# Patient Record
Sex: Male | Born: 1960 | Race: White | Hispanic: No | Marital: Married | Smoking: Current every day smoker
Health system: Southern US, Community
[De-identification: ages and names within clinical notes are randomized; demographics above are authoritative.]

## PROBLEM LIST (undated history)

## (undated) DIAGNOSIS — E119 Type 2 diabetes mellitus without complications: Secondary | ICD-10-CM

## (undated) DIAGNOSIS — I10 Essential (primary) hypertension: Secondary | ICD-10-CM

## (undated) DIAGNOSIS — E7800 Pure hypercholesterolemia, unspecified: Secondary | ICD-10-CM

## (undated) DIAGNOSIS — J449 Chronic obstructive pulmonary disease, unspecified: Secondary | ICD-10-CM

## (undated) DIAGNOSIS — M459 Ankylosing spondylitis of unspecified sites in spine: Secondary | ICD-10-CM

## (undated) DIAGNOSIS — F4310 Post-traumatic stress disorder, unspecified: Secondary | ICD-10-CM

## (undated) HISTORY — DX: Essential (primary) hypertension: I10

## (undated) HISTORY — PX: CARPAL TUNNEL RELEASE: SHX101

## (undated) HISTORY — PX: TONSILLECTOMY AND ADENOIDECTOMY: SHX28

## (undated) HISTORY — DX: Pure hypercholesterolemia, unspecified: E78.00

## (undated) HISTORY — DX: Post-traumatic stress disorder, unspecified: F43.10

## (undated) HISTORY — PX: APPENDECTOMY: SHX54

## (undated) HISTORY — DX: Chronic obstructive pulmonary disease, unspecified: J44.9

## (undated) HISTORY — PX: OTHER SURGICAL HISTORY: SHX169

---

## 2003-10-05 ENCOUNTER — Ambulatory Visit (HOSPITAL_BASED_OUTPATIENT_CLINIC_OR_DEPARTMENT_OTHER): Admission: RE | Admit: 2003-10-05 | Discharge: 2003-10-05 | Payer: Self-pay | Admitting: Neurology

## 2005-08-03 ENCOUNTER — Ambulatory Visit: Payer: Self-pay | Admitting: Family Medicine

## 2005-10-04 ENCOUNTER — Ambulatory Visit: Payer: Self-pay | Admitting: Family Medicine

## 2005-12-28 ENCOUNTER — Ambulatory Visit: Payer: Self-pay | Admitting: Family Medicine

## 2006-03-05 ENCOUNTER — Encounter: Payer: Self-pay | Admitting: Family Medicine

## 2006-03-06 DIAGNOSIS — F429 Obsessive-compulsive disorder, unspecified: Secondary | ICD-10-CM | POA: Insufficient documentation

## 2006-03-06 DIAGNOSIS — E669 Obesity, unspecified: Secondary | ICD-10-CM | POA: Insufficient documentation

## 2006-03-06 DIAGNOSIS — E7800 Pure hypercholesterolemia, unspecified: Secondary | ICD-10-CM | POA: Insufficient documentation

## 2006-04-15 ENCOUNTER — Encounter: Admission: RE | Admit: 2006-04-15 | Discharge: 2006-04-15 | Payer: Self-pay | Admitting: Sports Medicine

## 2006-06-14 ENCOUNTER — Encounter: Payer: Self-pay | Admitting: Family Medicine

## 2006-06-14 ENCOUNTER — Ambulatory Visit: Payer: Self-pay | Admitting: Family Medicine

## 2006-08-29 ENCOUNTER — Ambulatory Visit: Payer: Self-pay | Admitting: Family Medicine

## 2006-08-29 DIAGNOSIS — R030 Elevated blood-pressure reading, without diagnosis of hypertension: Secondary | ICD-10-CM | POA: Insufficient documentation

## 2006-09-10 ENCOUNTER — Ambulatory Visit: Payer: Self-pay | Admitting: Family Medicine

## 2006-10-08 ENCOUNTER — Ambulatory Visit: Payer: Self-pay | Admitting: Family Medicine

## 2006-12-28 ENCOUNTER — Encounter: Admission: RE | Admit: 2006-12-28 | Discharge: 2006-12-28 | Payer: Self-pay | Admitting: Family Medicine

## 2006-12-28 ENCOUNTER — Ambulatory Visit: Payer: Self-pay | Admitting: Family Medicine

## 2006-12-28 ENCOUNTER — Telehealth: Payer: Self-pay | Admitting: Family Medicine

## 2006-12-28 DIAGNOSIS — M25569 Pain in unspecified knee: Secondary | ICD-10-CM | POA: Insufficient documentation

## 2007-01-01 ENCOUNTER — Encounter: Payer: Self-pay | Admitting: Family Medicine

## 2007-01-15 ENCOUNTER — Encounter: Payer: Self-pay | Admitting: Family Medicine

## 2007-04-08 ENCOUNTER — Ambulatory Visit: Payer: Self-pay | Admitting: Family Medicine

## 2008-01-08 ENCOUNTER — Ambulatory Visit: Payer: Self-pay | Admitting: Family Medicine

## 2008-06-25 ENCOUNTER — Ambulatory Visit: Payer: Self-pay | Admitting: Family Medicine

## 2008-08-17 ENCOUNTER — Ambulatory Visit: Payer: Self-pay | Admitting: Family Medicine

## 2008-08-17 ENCOUNTER — Encounter: Admission: RE | Admit: 2008-08-17 | Discharge: 2008-08-17 | Payer: Self-pay | Admitting: Family Medicine

## 2008-08-21 ENCOUNTER — Telehealth: Payer: Self-pay | Admitting: Family Medicine

## 2008-08-26 ENCOUNTER — Encounter: Payer: Self-pay | Admitting: Family Medicine

## 2008-09-02 ENCOUNTER — Ambulatory Visit (HOSPITAL_COMMUNITY): Admission: RE | Admit: 2008-09-02 | Discharge: 2008-09-02 | Payer: Self-pay | Admitting: Rheumatology

## 2008-09-10 ENCOUNTER — Encounter: Payer: Self-pay | Admitting: Family Medicine

## 2008-09-29 ENCOUNTER — Encounter: Payer: Self-pay | Admitting: Family Medicine

## 2008-12-16 ENCOUNTER — Ambulatory Visit: Payer: Self-pay | Admitting: Family Medicine

## 2009-01-01 ENCOUNTER — Encounter: Payer: Self-pay | Admitting: Family Medicine

## 2009-03-09 ENCOUNTER — Encounter: Payer: Self-pay | Admitting: Family Medicine

## 2009-05-04 ENCOUNTER — Encounter: Payer: Self-pay | Admitting: Family Medicine

## 2009-06-24 ENCOUNTER — Ambulatory Visit: Payer: Self-pay | Admitting: Family Medicine

## 2009-06-25 ENCOUNTER — Ambulatory Visit: Payer: Self-pay | Admitting: Family Medicine

## 2009-06-30 ENCOUNTER — Encounter: Admission: RE | Admit: 2009-06-30 | Discharge: 2009-06-30 | Payer: Self-pay | Admitting: Family Medicine

## 2009-06-30 ENCOUNTER — Ambulatory Visit: Payer: Self-pay | Admitting: Family Medicine

## 2009-07-05 ENCOUNTER — Ambulatory Visit: Payer: Self-pay | Admitting: Family Medicine

## 2009-07-11 ENCOUNTER — Ambulatory Visit: Payer: Self-pay | Admitting: Internal Medicine

## 2009-07-29 ENCOUNTER — Encounter: Payer: Self-pay | Admitting: Family Medicine

## 2009-09-06 ENCOUNTER — Encounter: Payer: Self-pay | Admitting: Family Medicine

## 2009-09-22 ENCOUNTER — Ambulatory Visit: Payer: Self-pay | Admitting: Family Medicine

## 2009-09-26 ENCOUNTER — Ambulatory Visit: Payer: Self-pay | Admitting: Family Medicine

## 2009-09-30 ENCOUNTER — Ambulatory Visit: Payer: Self-pay | Admitting: Family Medicine

## 2009-09-30 DIAGNOSIS — M459 Ankylosing spondylitis of unspecified sites in spine: Secondary | ICD-10-CM

## 2009-09-30 DIAGNOSIS — M450 Ankylosing spondylitis of multiple sites in spine: Secondary | ICD-10-CM | POA: Insufficient documentation

## 2009-11-11 ENCOUNTER — Encounter: Payer: Self-pay | Admitting: Family Medicine

## 2010-02-17 ENCOUNTER — Encounter: Payer: Self-pay | Admitting: Family Medicine

## 2010-03-16 ENCOUNTER — Ambulatory Visit: Payer: Self-pay | Admitting: Family Medicine

## 2010-05-03 ENCOUNTER — Ambulatory Visit: Payer: Self-pay | Admitting: Family Medicine

## 2010-05-31 ENCOUNTER — Encounter: Payer: Self-pay | Admitting: Family Medicine

## 2010-06-19 ENCOUNTER — Encounter: Payer: Self-pay | Admitting: Sports Medicine

## 2010-06-19 ENCOUNTER — Encounter: Payer: Self-pay | Admitting: Rheumatology

## 2010-06-30 NOTE — Letter (Signed)
Summary: Out of Work  [facility]  [address]   [place], [place] [address]   Phone: [phone]  Fax: [phone]    September 22, 2009   Employee:  Robert Frey    To Whom It May Concern:   For Medical reasons, please excuse the above named employee from work for the following dates:  Start:   09/22/2009  Return:   09/25/2009  If you need additional information, please feel free to contact our office.         Sincerely,    Hassan Rowan MD

## 2010-06-30 NOTE — Assessment & Plan Note (Signed)
Summary: Chest congestion, fever, cough-dry x 1 dy rm 3   Vital Signs:  Patient Profile:   50 Years Old Male CC:      Cold & URI symptoms Height:     70.25 inches (178.44 cm) Weight:      241 pounds O2 Sat:      93 % O2 treatment:    Room Air Temp:     99.0 degrees F oral Pulse rate:   101 / minute Pulse rhythm:   regular Resp:     15 per minute BP sitting:   114 / 68  (right arm) Cuff size:   114regular  Vitals Entered By: Areta Haber CMA (June 24, 2009 6:57 PM)                  Current Allergies: ! * Z-PACK     History of Present Illness History from: patient Chief Complaint: Cold & URI symptoms History of Present Illness: patient c/o chest pain that worsens with cough since this morning associated with chills, night sweats and low grade temp that began last night.  Reports cough is nonproductive in nature, took tylenol for symptoms with minimal relief. DENIES SHORTNESS OF BREATH, RUNNY NOSE, AND SORE THROAT  Current Problems: PNEUMONIA, ORGANISM UNSPECIFIED (ICD-486) COUGH (ICD-786.2) URI (ICD-465.9) ACUT SUPPRATV OTITIS MEDIA W/O SPONT RUP EARDRUM (ICD-382.00) KNEE PAIN, BILATERAL (ICD-719.46) WRIST PAIN, LEFT (ICD-719.43) HORDEOLUM INTERNUM (ICD-373.12) BRONCHITIS NOS (ICD-490) ELEVATED BLOOD PRESSURE WITHOUT DIAGNOSIS OF HYPERTENSION (ICD-796.2) VACCINE AGAINST INFLUENZA (ICD-V04.81) SINUSITIS, ACUTE NOS (ICD-461.9) OBSESSIVE COMPUL. DISORDER (ICD-300.3) OBESITY, NOS (ICD-278.00) HYPERCHOLESTEROLEMIA (ICD-272.0)   Current Meds IMIPRAMINE HCL 50 MG TABS (IMIPRAMINE HCL) take four by mouth, at bedtime FLUVOXAMINE MALEATE 100 MG TABS (FLUVOXAMINE MALEATE) twice a daytake one tablet by mouth VENTOLIN HFA 108 (90 BASE) MCG/ACT AERS (ALBUTEROL SULFATE) 2-4 puffs inhaled three times a day as needed cough, SOB, wheeze HYDROCODONE-HOMATROPINE 5-1.5 MG/5ML SYRP (HYDROCODONE-HOMATROPINE) 5ml by mouth at bedtime as needed cough METHOTREXATE 2.5 MG TABS  (METHOTREXATE SODIUM) injection directed to draw up .8 mgs by RA physician ENBREL SURECLICK 50 MG/ML SOLN (ETANERCEPT) As directed DOXYCYCLINE HYCLATE 100 MG CAPS (DOXYCYCLINE HYCLATE) 1 by mouth Q 12 HRS PROVENTIL HFA 108 (90 BASE) MCG/ACT AERS (ALBUTEROL SULFATE) 2 PUFFS QID as needed WHEEZING CHERATUSSIN AC 100-10 MG/5ML SYRP (GUAIFENESIN-CODEINE) 1-2 TSP by mouth Q 6 HRS as needed COUGH AND CONGESTION  REVIEW OF SYSTEMS Constitutional Symptoms       Complains of fever.     Denies chills, night sweats, weight loss, weight gain, and fatigue.  Eyes       Denies change in vision, eye pain, eye discharge, glasses, contact lenses, and eye surgery. Ear/Nose/Throat/Mouth       Complains of frequent runny nose, sinus problems, and hoarseness.      Denies hearing loss/aids, change in hearing, ear pain, ear discharge, dizziness, frequent nose bleeds, sore throat, and tooth pain or bleeding.      Comments: x 1 day Respiratory       Complains of dry cough.      Denies productive cough, wheezing, shortness of breath, asthma, bronchitis, and emphysema/COPD.  Cardiovascular       Denies murmurs, chest pain, and tires easily with exhertion.    Gastrointestinal       Denies stomach pain, nausea/vomiting, diarrhea, constipation, blood in bowel movements, and indigestion. Genitourniary       Denies painful urination, kidney stones, and loss of urinary control. Neurological       Complains  of headaches.      Denies paralysis, seizures, and fainting/blackouts. Musculoskeletal       Denies muscle pain, joint pain, joint stiffness, decreased range of motion, redness, swelling, muscle weakness, and gout.  Skin       Denies bruising, unusual mles/lumps or sores, and hair/skin or nail changes.  Psych       Denies mood changes, temper/anger issues, anxiety/stress, speech problems, depression, and sleep problems.  Past History:  Past Medical History: Last updated: 03/06/2006 _  Past Surgical  History: Last updated: 12/28/2006 appendectomy  1989  tonsil and nose sx for snoring 2002  Family History: Last updated: 06/14/2006 alcoholism-grandparents  Depression, DM, HTN, hi chol-parent, stroke-GF  Social History: Last updated: 03/06/2006 [employer] for the [employer], HS degree.  Married to [employer].  Have one child.  Quit smoking 08/13/04.  No EtOH, no drugs, 8 caffeinated drinks/day, no reg exercise.  Risk Factors: Smoking Status: quit (06/14/2006) Physical Exam General appearance: well developed, well nourished, no acute distress Head: normocephalic, atraumatic Eyes: conjunctivae and lids normal Pupils: equal, round, reactive to light Ears: normal, no lesions or deformities Oral/Pharynx: mucus membranes dry, posterior pharynx without erythema or exudate Neck: neck supple,  trachea midline, no masses Chest/Lungs: rales and inspiratory rhonchi in left lung Heart: regular rate and  rhythm, no murmur Assessment New Problems: COUGH (ICD-786.2)   Plan New Medications/Changes: CHERATUSSIN AC 100-10 MG/5ML SYRP (GUAIFENESIN-CODEINE) 1-2 TSP by mouth Q 6 HRS as needed COUGH AND CONGESTION  #6 OZ x 0, 06/24/2009, Kimberly Lykins DO PROVENTIL HFA 108 (90 BASE) MCG/ACT AERS (ALBUTEROL SULFATE) 2 PUFFS QID as needed WHEEZING  #1 UNIT x 0, 06/24/2009, Marvis Moeller DO DOXYCYCLINE HYCLATE 100 MG CAPS (DOXYCYCLINE HYCLATE) 1 by mouth Q 12 HRS  ##20 x 0, 06/24/2009, Marvis Moeller DO  New Orders: T-Chest x-ray, 2 views [71020] New Patient Level III [99203]   Prescriptions: CHERATUSSIN AC 100-10 MG/5ML SYRP (GUAIFENESIN-CODEINE) 1-2 TSP by mouth Q 6 HRS as needed COUGH AND CONGESTION  #6 OZ x 0   Entered and Authorized by:   Marvis Moeller DO   Signed by:   Marvis Moeller DO on 06/24/2009   Method used:   Print then Give to Patient   RxID:   [id number] PROVENTIL HFA 108 (90 BASE) MCG/ACT AERS (ALBUTEROL SULFATE) 2 PUFFS QID as needed WHEEZING  #1  UNIT x 0   Entered and Authorized by:   Marvis Moeller DO   Signed by:   Marvis Moeller DO on 06/24/2009   Method used:   Print then Give to Patient   RxID:   [id number] DOXYCYCLINE HYCLATE 100 MG CAPS (DOXYCYCLINE HYCLATE) 1 by mouth Q 12 HRS  ##20 x 0   Entered and Authorized by:   Marvis Moeller DO   Signed by:   Marvis Moeller DO on 06/24/2009   Method used:   Print then Give to Patient   RxID:   [id number]   Patient Instructions: 1)  TYLENOL OR MOTRIN AS NEEDED. AVOID CAFFEINE AND MILK PRODUCTS. FOLLOW UP WITH YOUR PCP OR RETURN IF SYMPTOMS PERSIST OR WORSEN

## 2010-06-30 NOTE — Letter (Signed)
Summary: [facility]  [facility]   Imported By: Lanelle Bal 11/26/2009 10:09:25  _____________________________________________________________________  External Attachment:    Type:   Image     Comment:   External Document

## 2010-06-30 NOTE — Assessment & Plan Note (Signed)
Summary: FLU/COLD/WB   Vital Signs:  Patient Profile:   50 Years Old Male CC:      runny nose, body aches, productive cough, SOB with exertion X 3 days Height:     70.25 inches (178.44 cm) Weight:      242 pounds O2 Sat:      98 % O2 treatment:    Room Air Temp:     98.3 degrees F oral Pulse rate:   109 / minute Resp:     16 per minute BP sitting:   136 / 90  (right arm) Cuff size:   large  Vitals Entered By: Lajean Saver RN (September 22, 2009 3:49 PM)                  Prior Medication List:  IMIPRAMINE HCL 50 MG TABS (IMIPRAMINE HCL) take four by mouth, at bedtime FLUVOXAMINE MALEATE 100 MG TABS (FLUVOXAMINE MALEATE) twice a daytake one tablet by mouth HYDROCODONE-HOMATROPINE 5-1.5 MG/5ML SYRP (HYDROCODONE-HOMATROPINE) 5ml by mouth at bedtime as needed cough METHOTREXATE 2.5 MG TABS (METHOTREXATE SODIUM) injection directed to draw up .8 mgs by RA physician ENBREL SURECLICK 50 MG/ML SOLN (ETANERCEPT) As directed PROVENTIL HFA 108 (90 BASE) MCG/ACT AERS (ALBUTEROL SULFATE) 2 PUFFS QID as needed WHEEZING PROMETHAZINE-CODEINE 6.25-10 MG/5ML SYRP (PROMETHAZINE-CODEINE) 5 ml by mouth q 6 hrs as needed cough FLUCONAZOLE 150 MG TABS (FLUCONAZOLE) 1 tab by mouth x 1 CHERATUSSIN AC 100-10 MG/5ML SYRP (GUAIFENESIN-CODEINE) Take 5ml by mouth q8 hours as needed cough   Updated Prior Medication List: IMIPRAMINE HCL 50 MG TABS (IMIPRAMINE HCL) take four by mouth, at bedtime HYDROCODONE-HOMATROPINE 5-1.5 MG/5ML SYRP (HYDROCODONE-HOMATROPINE) 5ml by mouth at bedtime as needed cough METHOTREXATE 2.5 MG TABS (METHOTREXATE SODIUM) injection directed to draw up .8 mgs by RA physician ENBREL SURECLICK 50 MG/ML SOLN (ETANERCEPT) As directed  Current Allergies (reviewed today): ! * Z-PACK     History of Present Illness Chief Complaint: runny nose, body aches, productive cough, SOB with exertion X 3 days History of Present Illness: Patient is a 50 year old W M w/HX of RA and immune  suppression drugs who has been sick foor the last 4 days. He has had increase sinus pressure cough and bronchospasm. Patient does not smoke and does not usuall has a HX of Copd.   Current Problems: ACUTE SINUSITIS, UNSPECIFIED (ICD-461.9) BRONCHITIS, ACUTE WITH BRONCHOSPASM (ICD-466.0) THRUSH (ICD-112.0) BRONCHITIS (ICD-490) PNEUMONIA, ORGANISM UNSPECIFIED (ICD-486) COUGH (ICD-786.2) URI (ICD-465.9) ACUT SUPPRATV OTITIS MEDIA W/O SPONT RUP EARDRUM (ICD-382.00) KNEE PAIN, BILATERAL (ICD-719.46) WRIST PAIN, LEFT (ICD-719.43) HORDEOLUM INTERNUM (ICD-373.12) BRONCHITIS NOS (ICD-490) ELEVATED BLOOD PRESSURE WITHOUT DIAGNOSIS OF HYPERTENSION (ICD-796.2) VACCINE AGAINST INFLUENZA (ICD-V04.81) SINUSITIS, ACUTE NOS (ICD-461.9) OBSESSIVE COMPUL. DISORDER (ICD-300.3) OBESITY, NOS (ICD-278.00) HYPERCHOLESTEROLEMIA (ICD-272.0)   Current Meds IMIPRAMINE HCL 50 MG TABS (IMIPRAMINE HCL) take four by mouth, at bedtime HYDROCODONE-HOMATROPINE 5-1.5 MG/5ML SYRP (HYDROCODONE-HOMATROPINE) 5ml by mouth at bedtime as needed cough METHOTREXATE 2.5 MG TABS (METHOTREXATE SODIUM) injection directed to draw up .8 mgs by RA physician ENBREL SURECLICK 50 MG/ML SOLN (ETANERCEPT) As directed AUGMENTIN 875-125 MG TABS (AMOXICILLIN-POT CLAVULANATE) 1 by mouth 2 times daily TUSSIONEX PENNKINETIC ER 8-10 MG/5ML LQCR (CHLORPHENIRAMINE-HYDROCODONE) sig 1 tsp by mouth twice aday PROAIR HFA 108 (90 BASE) MCG/ACT  AERS (ALBUTEROL SULFATE) 2 inh q4h as needed shortness of breath SYMBICORT 160-4.5 MCG/ACT  AERO (BUDESONIDE-FORMOTEROL FUMARATE) 2 inh bid  REVIEW OF SYSTEMS Constitutional Symptoms       Complains of fatigue.     Denies fever, chills, night sweats,  weight loss, and weight gain.      Comments: body aches Eyes       Denies change in vision, eye pain, eye discharge, glasses, contact lenses, and eye surgery. Ear/Nose/Throat/Mouth       Complains of frequent runny nose and sinus problems.      Denies hearing  loss/aids, change in hearing, ear pain, ear discharge, dizziness, frequent nose bleeds, sore throat, hoarseness, and tooth pain or bleeding.  Respiratory       Complains of productive cough.      Denies dry cough, wheezing, shortness of breath, asthma, bronchitis, and emphysema/COPD.  Cardiovascular       Denies murmurs, chest pain, and tires easily with exhertion.    Gastrointestinal       Denies stomach pain, nausea/vomiting, diarrhea, constipation, blood in bowel movements, and indigestion. Genitourniary       Denies painful urination, kidney stones, and loss of urinary control. Neurological       Denies paralysis, seizures, and fainting/blackouts. Musculoskeletal       Denies muscle pain, joint pain, joint stiffness, decreased range of motion, redness, swelling, muscle weakness, and gout.  Skin       Denies bruising, unusual mles/lumps or sores, and hair/skin or nail changes.  Psych       Denies mood changes, temper/anger issues, anxiety/stress, speech problems, depression, and sleep problems. Other Comments: X 3 days   Past History:  Family History: Last updated: 06/14/2006 alcoholism-grandparents  Depression, DM, HTN, hi chol-parent, stroke-GF  Social History: Last updated: 03/06/2006 [employer] for the [employer], HS degree.  Married to [employer].  Have one child.  Quit smoking 08/13/04.  No EtOH, no drugs, 8 caffeinated drinks/day, no reg exercise.  Risk Factors: Smoking Status: quit (06/14/2006)  Past Medical History: _spondilitis  Past Surgical History: appendectomy  1989  tonsil and nose sx for snoring 2002 Appendectomy Tonsillectomy + adenoids  Family History: Reviewed history from 06/14/2006 and no changes required. alcoholism-grandparents  Depression, DM, HTN, hi chol-parent, stroke-GF  Social History: Reviewed history from 03/06/2006 and no changes required. [employer] for the [employer], HS degree.  Married to [employer].  Have one child.  Quit smoking 08/13/04.  No EtOH, no drugs, 8 caffeinated drinks/day, no reg exercise. Physical Exam General appearance: obese, well developed, well nourished, mild distress Head: normocephalic, atraumatic Ears: normal, no lesions or deformities Nasal: swollen red turbinates with congestiontenderness over  Oral/Pharynx: tongue normal, posterior pharynx without erythema or exudate Neck: neck supple,  trachea midline, no masses Chest/Lungs: scattered wheezes throughout all lobes Heart: regular rate and  rhythm, no murmur Abdomen: protubert bowel sounds present Skin: no obvious rashes or lesions MSE: oriented to time, place, and person Assessment New Problems: ACUTE SINUSITIS, UNSPECIFIED (ICD-461.9) BRONCHITIS, ACUTE WITH BRONCHOSPASM (ICD-466.0)  bronchitis and sinusitis  Patient Education: Patient and/or caregiver instructed in the following: rest fluids and Tylenol.  Plan New Medications/Changes: SYMBICORT 160-4.5 MCG/ACT  AERO (BUDESONIDE-FORMOTEROL FUMARATE) 2 inh bid  #1 x 0, 09/22/2009, Hassan Rowan MD PROAIR HFA 108 (90 BASE) MCG/ACT  AERS (ALBUTEROL SULFATE) 2 inh q4h as needed shortness of breath  #1 x 0, 09/22/2009, Hassan Rowan MD TUSSIONEX PENNKINETIC ER 8-10 MG/5ML LQCR (CHLORPHENIRAMINE-HYDROCODONE) sig 1 tsp by mouth twice aday  #4 floz x 0, 09/22/2009, Hassan Rowan MD AUGMENTIN [phone] MG TABS (AMOXICILLIN-POT CLAVULANATE) 1 by mouth 2 times daily  #20 x 0, 09/22/2009, Hassan Rowan MD  New Orders: Est. Patient Level III [  99213] Nebulizer Tx [id number] Rocephin  250mg  [J0696] Solumedrol up to 125mg  [J2930] Admin of Therapeutic Inj  intramuscular or subcutaneous [96372] Albuterol Sulfate Sol 1mg  unit dose [J7613] Ipratropium inhalation sol. unit dose [Z3086] Follow Up: Follow up on an as needed basis, Follow up with Primary Physician  The patient and/or caregiver has been counseled thoroughly with regard to medications prescribed including dosage,  schedule, interactions, rationale for use, and possible side effects and they verbalize understanding.  Diagnoses and expected course of recovery discussed and will return if not improved as expected or if the condition worsens. Patient and/or caregiver verbalized understanding.  Prescriptions: SYMBICORT 160-4.5 MCG/ACT  AERO (BUDESONIDE-FORMOTEROL FUMARATE) 2 inh bid  #1 x 0   Entered and Authorized by:   Hassan Rowan MD   Signed by:   Hassan Rowan MD on 09/22/2009   Method used:   Printed then faxed to ...       [facility]  [place] [phone]* (retail)       [address] [place], [place]  [address]       Ph: [phone] or [phone]       Fax: [phone]   RxID:   [id number] PROAIR HFA 108 (90 BASE) MCG/ACT  AERS (ALBUTEROL SULFATE) 2 inh q4h as needed shortness of breath  #1 x 0   Entered and Authorized by:   Hassan Rowan MD   Signed by:   Hassan Rowan MD on 09/22/2009   Method used:   Printed then faxed to ...       [facility]  [place] [phone]* (retail)       [address] [place], [place]  [address]       Ph: [phone] or [phone]       Fax: [phone]   RxID:   [id number] Sandria Senter ER 8-10 MG/5ML LQCR (CHLORPHENIRAMINE-HYDROCODONE) sig 1 tsp by mouth twice aday  #4 floz x 0   Entered and Authorized by:   Hassan Rowan MD   Signed by:   Hassan Rowan MD on 09/22/2009   Method used:   Printed then faxed to ...       [facility]  [place] [phone][facility] (retail)       [address] [place], [place]  [address]       Ph: [phone] or [phone]       Fax: [phone]   RxID:   [id number] AUGMENTIN 875-125 MG TABS (AMOXICILLIN-POT CLAVULANATE) 1 by mouth 2 times daily  #20 x 0   Entered and Authorized by:   Hassan Rowan MD   Signed by:   Hassan Rowan MD on 09/22/2009   Method used:   Printed then faxed to ...       [facility]  [place] [phone][facility] (retail)       [address] [place], [place]  [address]       Ph: [phone] or [phone]       Fax: [phone]    RxID:   [id number]   Patient Instructions: 1)  Please schedule an appointment with your primary doctor in : 2)  Please schedule a follow-up appointment in 1-2 weeks. 3)  Take your antibiotic as prescribed until ALL of it is gone, but stop if you develop a rash or swelling and contact our office as soon as possible. 4)  Acute sinusitis symptoms  for less than 10 days are not helped by antibiotics.Use warm moist compresses, and over the counter decongestants ( only as directed). Call if no improvement in 5-7 days, sooner if increasing pain, fever, or new symptoms. 5)  Acute bronchitis symptoms for less than 10 days are not helped by antibiotics. take over the counter cough medications. call if no improvment in  5-7 days, sooner if increasing cough, fever, or new symptoms( shortness of breath, chest pain). 6)  It is important to use your inhaler properly. Use a spacer, take slow deep breaths and hold them. Rinse your mouth after using.    Medication Administration  Injection # 1:    Medication: Solumedrol up to 125mg     Diagnosis: ACUTE SINUSITIS, UNSPECIFIED (ICD-461.9)    Route: IM    Site: LUOQ gluteus    Exp Date: 09/31/2013    Lot #: [id number]    Mfr: Pharmacia    Patient tolerated injection without complications    Given by: Lajean Saver, RN  Injection # 2:    Medication: Rocephin  250mg     Diagnosis: ACUTE SINUSITIS, UNSPECIFIED (ICD-461.9)    Route: IM    Site: R deltoid    Exp Date: 04/27/2012    Lot #: [id number]    Mfr: Sandox    Comments: Administerd 1gram    Patient tolerated injection without complications    Given by: Lajean Saver, RN  Medication # 1:    Medication: Albuterol Sulfate Sol 1mg  unit dose    Diagnosis: ACUTE SINUSITIS, UNSPECIFIED (ICD-461.9)    Route: inhaled    Exp Date: 01/27/2011    Lot #: [id number]    Mfr: Nephron    Patient tolerated medication without complications    Given by: Areta Haber CMA (September 22, 2009 5:09 PM)  Medication #  2:    Medication: Ipratropium inhalation sol. unit dose    Diagnosis: ACUTE SINUSITIS, UNSPECIFIED (ICD-461.9)    Route: inhaled    Exp Date: 09/25/2009    Lot #: [id number]    Mfr: Nephron    Patient tolerated medication without complications    Given by: Areta Haber CMA (September 22, 2009 5:09 PM)  Orders Added: 1)  Est. Patient Level III [62130] 2)  Nebulizer Tx [94640] 3)  Rocephin  250mg  [J0696] 4)  Solumedrol up to 125mg  [J2930] 5)  Admin of Therapeutic Inj  intramuscular or subcutaneous [96372] 6)  Albuterol Sulfate Sol 1mg  unit dose [J7613] 7)  Ipratropium inhalation sol. unit dose [Q6578]

## 2010-06-30 NOTE — Assessment & Plan Note (Signed)
Summary: Drug with drawal   Vital Signs:  Patient profile:   50 year old male Height:      70.25 inches Weight:      236 pounds Pulse rate:   90 / minute BP sitting:   148 / 92  (right arm) Cuff size:   large  Vitals Entered By: Avon Gully CMA, Duncan Dull) (March 16, 2010 11:03 AM) CC: had flu shot 1 week ago , had body aches and sinus congestion and HA since then but feels better today   Primary Care Provider:  Nani Gasser MD  CC:  had flu shot 1 week ago  and had body aches and sinus congestion and HA since then but feels better today.  History of Present Illness: had flu shot 1 week ago , had body aches and sinus congestion and HA since then but feels better today. No fever. Thinks had it really before he got the shot.    Feels he is addicted to teh hydrocone. When he doesn't take it feels like having hot flashes, sweating.  Now feels he is taking more and more for his AK pain.  Has tried taking Ultram but feels the withdrawal sxs are worse. Was taking 5/500 hydrocodone 3 x a day. Stopped it this AM and flushed them down the toilet Has told his rheumatologist.   Current Medications (verified): 1)  Imipramine Hcl 50 Mg Tabs (Imipramine Hcl) .... Take Four By Mouth, At Bedtime 2)  Hydrocodone-Homatropine 5-1.5 Mg/86ml Syrp (Hydrocodone-Homatropine) .... 5ml By Mouth At Bedtime As Needed Cough 3)  Methotrexate 2.5 Mg Tabs (Methotrexate Sodium) .... Injection Directed To Draw Up .8 Mgs By Ra Physician 4)  Enbrel Sureclick 50 Mg/ml Soln (Etanercept) .... As Directed  Allergies (verified): 1)  ! * Z-Pack  Comments:  Nurse/Medical Assistant: The patient's medications and allergies were reviewed with the patient and were updated in the Medication and Allergy Lists. Avon Gully CMA, Duncan Dull) (March 16, 2010 11:04 AM)  Physical Exam  General:  Well-developed,well-nourished,in no acute distress; alert,appropriate and cooperative throughout examination Lungs:  Normal  respiratory effort, chest expands symmetrically. Lungs are clear to auscultation, no crackles or wheezes. Heart:  Normal rate and regular rhythm. S1 and S2 normal without gallop, murmur, click, rub or other extra sounds.   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 Then will need to f/u with rheum for other tx.  His is already on enbrel and mtx.   Stay really well hydrated and make sure eatting foods.  TAke the valium 10mg  two times a day for 3 days  Can use the phenergan as needed for nausea and vomiting Can use Ibuprofen 600mg  three times a day as needed for muscle aches and pains. Take with food.  Call if getting diarrhea.   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 Then will need to f/u with rheum for other tx.  His is already on enbrel and mtx.   Stay really well hydrated and make sure eatting foods.  TAke the valium 10mg  two times a day for 3 days  Can use the phenergan as needed for nausea and vomiting Can use Ibuprofen 600mg  three times a day as needed for  muscle aches and pains. Take with food.  Call if getting diarrhea.   Complete Medication List: 1)  Imipramine Hcl 50 Mg Tabs (Imipramine hcl) .... Take four by mouth, at bedtime 2)  Methotrexate 2.5 Mg Tabs (Methotrexate sodium) .... Injection directed to draw up .8 mgs by ra physician 3)  Enbrel Sureclick 50 Mg/ml Soln (Etanercept) .... As directed 4)  Diazepam 10 Mg Tabs (Diazepam) .... Take 1 tablet by mouth two times a day for 3 days 5)  Promethazine Hcl 25 Mg Tabs (Promethazine hcl) .... One by mouth up three times a day for nausea  Patient Instructions: 1)  Stay really well hydrated and make sure eatting foods.  2)   TAke the valium 10mg  two times a day for 3 days  3)  Can use the phenergan as needed for nausea and vomiting 4)  Can use Ibuprofen 600mg  three times a day as needed for muscle aches and pains. Take with food.  5)  Call if getting diarrhea.  Prescriptions: PROMETHAZINE HCL 25 MG TABS (PROMETHAZINE HCL) one by mouth up three times a day for nausea  #20 x 0   Entered and Authorized by:   Nani Gasser MD   Signed by:   Nani Gasser MD on 03/16/2010   Method used:   Print then Give to Patient   RxID:   [id number] DIAZEPAM 10 MG TABS (DIAZEPAM) Take 1 tablet by mouth two times a day for 3 days  #6 x 0   Entered and Authorized by:   Nani Gasser MD   Signed by:   Nani Gasser MD on 03/16/2010   Method used:   Print then Give to Patient   RxID:   [id number]    Orders Added: 1)  Est. Patient Level III [72536]

## 2010-06-30 NOTE — Letter (Signed)
Summary: Out of Work  [facility]  [address]   [place], [place] [address]   Phone: [phone]  Fax: [phone]    Sep 26, 2009   Employee:  Robert Frey    To Whom It May Concern:   For Medical reasons, please excuse the above named employee from work tomorrow.    If you need additional information, please feel free to contact our office.         Sincerely,    Donna Christen MD

## 2010-06-30 NOTE — Assessment & Plan Note (Signed)
Summary: PNA   Vital Signs:  Patient profile:   50 year old male Height:      70.25 inches Weight:      240 pounds BMI:     34.32 Temp:     98.4 degrees F oral Pulse rate:   94 / minute BP sitting:   113 / 57  (left arm) Cuff size:   large  Vitals Entered By: Kathlene November (June 30, 2009 2:30 PM) CC: chest congestion, vomiting, cough   Primary Care Provider:  Nani Gasser MD  CC:  chest congestion, vomiting, and cough.  History of Present Illness: 50 yo WM presents for problems with cough, fevers, and congestion x 7 days.  Ran fevers of 103 for the first 3 days.  He had bodyaches and some HAs.  No nasal congestion.  No sore throat.  He had vomitting today.  He felt nauseated today.  He is on Doxycyline that was started at [facility] last wk but has not been improving.  He cough is non productive.  Denies feeling SOB or CP.  Using Rx cough syrup at night.  He is not a smoker.  Took Embrel over the weekend.    Current Medications (verified): 1)  Imipramine Hcl 50 Mg Tabs (Imipramine Hcl) .... Take Four By Mouth, At Bedtime 2)  Fluvoxamine Maleate 100 Mg Tabs (Fluvoxamine Maleate) .... Twice A Daytake One Tablet By Mouth 3)  Doxycycline Hyclate 100 Mg Caps (Doxycycline Hyclate) .... Take 1 Tablet By Mouth Two Times A Day For 10 Days 4)  Ventolin Hfa 108 (90 Base) Mcg/act Aers (Albuterol Sulfate) .... 2-4 Puffs Inhaled Three Times A Day As Needed Cough, Sob, Wheeze 5)  Hydrocodone-Homatropine 5-1.5 Mg/39ml Syrp (Hydrocodone-Homatropine) .... 5ml By Mouth At Bedtime As Needed Cough 6)  Doxycycline Hyclate 100 Mg Caps (Doxycycline Hyclate) .Marland Kitchen.. 1 By Mouth Q 12 Hrs 7)  Proventil Hfa 108 (90 Base) Mcg/act Aers (Albuterol Sulfate) .... 2 Puffs Qid As Needed Wheezing 8)  Cheratussin Ac 100-10 Mg/74ml Syrp (Guaifenesin-Codeine) .Marland Kitchen.. 1-2 Tsp By Mouth Q 6 Hrs As Needed Cough and Congestion  Allergies (verified): 1)  ! * Z-Pack  Comments:  Nurse/Medical Assistant: The patient's medications  and allergies were reviewed with the patient and were updated in the Medication and Allergy Lists. Kathlene November (June 30, 2009 2:31 PM)  Past History:  Past Medical History: Reviewed history from 03/06/2006 and no changes required. _  Social History: Reviewed history from 03/06/2006 and no changes required. [employer] for the [employer] [place], HS degree.  Married to [employer].  Have one child.  Quit smoking 08/13/04.  No EtOH, no drugs, 8 caffeinated drinks/day, no reg exercise.  Review of Systems      See HPI  Physical Exam  General:  alert, well-developed, well-nourished, and well-hydrated.  obese Head:  normocephalic and atraumatic.   Eyes:  conjunctiva clear Ears:  EACs patent; TMs translucent and gray with good cone of light and bony landmarks.  Nose:  no nasal discharge.   Mouth:  good dentition and pharynx pink and moist.   Neck:  no masses.   Lungs:  course bilateral rhonchi diffuse with mild wheezing.  No crackles.  Decreased BS over the bases.  Nonlabrored. Heart:  Normal rate and regular rhythm. S1 and S2 normal without gallop, murmur, click, rub or other extra sounds. Skin:  color normal.   Cervical Nodes:  No lymphadenopathy noted   Impression & Recommendations:  Problem # 1:  PNEUMONIA, ORGANISM UNSPECIFIED (ICD-486) Lung exam c/w pneumonia along with a drop in pulse ox to 93%.  At risk for this given use of immunosuppressants. Change abx to Avelox daily and f/u to repeat lung exam and pulse ox in 2 days.  Given clinical worsening from UC visit, repeat CXR today.  Use plain Mucinex q 12 hrs and changed nighttime cough medicine to Phenergan with Codeine given new symptom of N/V. Call if worsening -  he may need hospitalization if worsening. His updated medication list for this problem includes:      Avelox 400 Mg Tabs (Moxifloxacin hcl) .Marland Kitchen... 1 tab by mouth daily x 7 days  Complete Medication List: 1)  Imipramine Hcl 50 Mg Tabs (Imipramine hcl)  .... Take four by mouth, at bedtime 2)  Fluvoxamine Maleate 100 Mg Tabs (Fluvoxamine maleate) .... Twice a daytake one tablet by mouth 3)  Ventolin Hfa 108 (90 Base) Mcg/act Aers (Albuterol sulfate) .... 2-4 puffs inhaled three times a day as needed cough, sob, wheeze 4)  Hydrocodone-homatropine 5-1.5 Mg/42ml Syrp (Hydrocodone-homatropine) .... 5ml by mouth at bedtime as needed cough 5)  Methotrexate 2.5 Mg Tabs (Methotrexate sodium) .... Injection directed to draw up .8 mgs by ra physician 6)  Enbrel Sureclick 50 Mg/ml Soln (Etanercept) .... As directed 7)  Proventil Hfa 108 (90 Base) Mcg/act Aers (Albuterol sulfate) .... 2 puffs qid as needed wheezing 8)  Avelox 400 Mg Tabs (Moxifloxacin hcl) .Marland Kitchen.. 1 tab by mouth daily x 7 days 9)  Promethazine-codeine 6.25-10 Mg/15ml Syrp (Promethazine-codeine) .... 5 ml by mouth q 6 hrs as needed cough 10)  Fluconazole 150 Mg Tabs (Fluconazole) .Marland Kitchen.. 1 tab by mouth x 1  Other Orders: T-Chest x-ray, 2 views ([address])  Patient Instructions: 1)  Change Doxycycline to Avelox.  Start TODAY. 2)  Take OTC plain Mucinex every 12 hrs. 3)  Take it easy!   4)  CXR today. 5)  Will call you w/ results tomorrow morning. 6)  Return for recheck lungs on Friday afternoon. Prescriptions: FLUCONAZOLE 150 MG TABS (FLUCONAZOLE) 1 tab by mouth x 1  #1 x 0   Entered and Authorized by:   Seymour Bars DO   Signed by:   Seymour Bars DO on 06/30/2009   Method used:   Electronically to        [facility] [phone][facility] (retail)       [address] [place], [place]  [address]       Ph: [phone] or [phone]       Fax: [phone]   RxID:   [id number] PROMETHAZINE-CODEINE 6.25-10 MG/5ML SYRP (PROMETHAZINE-CODEINE) 5 ml by mouth q 6 hrs as needed cough  #200 ml x 0   Entered and Authorized by:   Seymour Bars DO   Signed by:   Seymour Bars DO on 06/30/2009   Method used:   Printed then faxed to ...       [facility]  [place] [phone][facility] (retail)       [address] [place], [place]  [address]       Ph: [phone] or [phone]       Fax: [phone]   RxID:   [id number] AVELOX 400 MG TABS (MOXIFLOXACIN HCL) 1 tab by mouth daily x 7 days  #7 tabs x 0   Entered and Authorized by:   Seymour Bars DO   Signed by:   Seymour Bars DO  on 06/30/2009   Method used:   [facility]        [facility] [phone][facility] (retail)       [address] [place], [place]  [address]       Ph: [phone] or [phone]       Fax: [phone]   RxID:   [id number]

## 2010-06-30 NOTE — Letter (Signed)
Summary: [facility]  [facility]   Imported By: Lanelle Bal 08/06/2009 10:55:18  _____________________________________________________________________  External Attachment:    Type:   Image     Comment:   External Document

## 2010-06-30 NOTE — Assessment & Plan Note (Signed)
Summary: PNA   Vital Signs:  Patient profile:   50 year old male Height:      70.25 inches Weight:      237 pounds BMI:     33.89 Temp:     98.1 degrees F oral Pulse rate:   93 / minute BP sitting:   108 / 70  Vitals Entered By: Kandice Hams (July 05, 2009 9:47 AM) CC: followup on PNA   Primary Care Provider:  Nani Gasser MD  CC:  followup on PNA.  History of Present Illness: Overall better. Cough is a little better. Dry cough. No more fever.  A little energy is back.  No CP or SOB. occ will hear extra noises in his lungs.  Thinks ready to go back to work tomorrow.   Allergies: 1)  ! * Z-Pack  Physical Exam  General:  Well-developed,well-nourished,in no acute distress; alert,appropriate and cooperative throughout examination Head:  Normocephalic and atraumatic without obvious abnormalities. No apparent alopecia or balding. Eyes:  No corneal or conjunctival inflammation noted. EOMI. Perrla.  Lungs:  Slight expiratory wheeze on the right, o/w good air movememtn.  Heart:  Normal rate and regular rhythm. S1 and S2 normal without gallop, murmur, click, rub or other extra sounds. Skin:  no rashes.   Psych:  Cognition and judgment appear intact. Alert and cooperative with normal attention span and concentration. No apparent delusions, illusions, hallucinations   Impression & Recommendations:  Problem # 1:  PNEUMONIA, ORGANISM UNSPECIFIED (ICD-486) Improving. Took last dose ABX today. Ok to return to work tomorrow if he is feeling better.  Refilled cough med and work note given.  The following medications were removed from the medication list:    Avelox 400 Mg Tabs (Moxifloxacin hcl) .Marland Kitchen... 1 tab by mouth daily x 7 days  Complete Medication List: 1)  Imipramine Hcl 50 Mg Tabs (Imipramine hcl) .... Take four by mouth, at bedtime 2)  Fluvoxamine Maleate 100 Mg Tabs (Fluvoxamine maleate) .... Twice a daytake one tablet by mouth 3)  Ventolin Hfa 108 (90 Base) Mcg/act  Aers (Albuterol sulfate) .... 2-4 puffs inhaled three times a day as needed cough, sob, wheeze 4)  Hydrocodone-homatropine 5-1.5 Mg/37ml Syrp (Hydrocodone-homatropine) .... 5ml by mouth at bedtime as needed cough 5)  Methotrexate 2.5 Mg Tabs (Methotrexate sodium) .... Injection directed to draw up .8 mgs by ra physician 6)  Enbrel Sureclick 50 Mg/ml Soln (Etanercept) .... As directed 7)  Proventil Hfa 108 (90 Base) Mcg/act Aers (Albuterol sulfate) .... 2 puffs qid as needed wheezing 8)  Promethazine-codeine 6.25-10 Mg/30ml Syrp (Promethazine-codeine) .... 5 ml by mouth q 6 hrs as needed cough 9)  Fluconazole 150 Mg Tabs (Fluconazole) .Marland Kitchen.. 1 tab by mouth x 1 Prescriptions: HYDROCODONE-HOMATROPINE 5-1.5 MG/5ML SYRP (HYDROCODONE-HOMATROPINE) 5ml by mouth at bedtime as needed cough  #125ml x 0   Entered and Authorized by:   Nani Gasser MD   Signed by:   Nani Gasser MD on 07/05/2009   Method used:   Print then Give to Patient   RxID:   [id number]

## 2010-06-30 NOTE — Letter (Signed)
Summary: Out of Work  [facility]  [address]   [place], [place] [address]   Phone: [phone]  Fax: [phone]    June 24, 2009   Employee:  [name]    To Whom It May Concern:   For Medical reasons, please excuse the above named employee from work for the following dates:  Start:   24 Jun 2009    End:   27 Jun 2009  If you need additional information, please feel free to contact our office.         Sincerely,    Marvis Moeller DO

## 2010-06-30 NOTE — Assessment & Plan Note (Signed)
Summary: chest congestion, wheezing x rm 2   Vital Signs:  Patient Profile:   50 Years Old Male CC:      Cold & URI symptoms Height:     70.25 inches (178.44 cm) Weight:      242 pounds O2 Sat:      96 % O2 treatment:    Room Air Temp:     98.3 degrees F oral Pulse rate:   102 / minute Pulse rhythm:   regular Resp:     15 per minute BP sitting:   137 / 88  (right arm) Cuff size:   regular  Vitals Entered By: Areta Haber CMA (Sep 26, 2009 3:52 PM)                  Current Allergies: ! * Z-PACK     History of Present Illness Chief Complaint: Cold & URI symptoms History of Present Illness: Subjective:  Patient reports that since his previous visit his cough has worsened (especially at night) and become less producitve.  He complains of tightness in the anterior chest when coughing, and wheezes.  No pleuritic pain.  He has had increased facial pressure.  He has had chills/sweats, and last night had myalgias.  He notes that he recovered from pneumonia about 2 months ago.  No GI or GU symptoms.  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 DISORDER (ICD-300.3) OBESITY, NOS (ICD-278.00) HYPERCHOLESTEROLEMIA (ICD-272.0)   Current Meds IMIPRAMINE HCL 50 MG TABS (IMIPRAMINE HCL) take four by mouth, at bedtime HYDROCODONE-HOMATROPINE 5-1.5 MG/5ML SYRP (HYDROCODONE-HOMATROPINE) 5ml by mouth at bedtime as  needed cough METHOTREXATE 2.5 MG TABS (METHOTREXATE SODIUM) injection directed to draw up .8 mgs by RA physician ENBREL SURECLICK 50 MG/ML SOLN (ETANERCEPT) As directed AUGMENTIN 875-125 MG TABS (AMOXICILLIN-POT CLAVULANATE) 1 by mouth 2 times daily TUSSIONEX PENNKINETIC ER 8-10 MG/5ML LQCR (CHLORPHENIRAMINE-HYDROCODONE) sig 1 tsp by mouth twice aday PROAIR HFA 108 (90 BASE) MCG/ACT  AERS (ALBUTEROL SULFATE) 2 inh q4h as needed shortness of breath SYMBICORT 160-4.5 MCG/ACT  AERO (BUDESONIDE-FORMOTEROL FUMARATE) 2 inh bid BENZONATATE 200 MG CAPS (BENZONATATE) One by mouth hs as needed cough  REVIEW OF SYSTEMS Constitutional Symptoms      Denies fever, chills, night sweats, weight loss, weight gain, and fatigue.  Eyes       Denies change in vision, eye pain, eye discharge, glasses, contact lenses, and eye surgery. Ear/Nose/Throat/Mouth       Complains of sinus problems.      Denies hearing loss/aids, change in hearing, ear pain, ear discharge, dizziness, frequent runny nose, frequent nose bleeds, sore throat, hoarseness, and tooth pain or bleeding.  Respiratory       Complains of dry cough and wheezing.      Denies productive cough, shortness of breath, asthma, bronchitis, and emphysema/COPD.      Comments: chest congestion Cardiovascular       Denies murmurs, chest pain, and tires easily with exhertion.    Gastrointestinal       Denies stomach pain, nausea/vomiting, diarrhea, constipation, blood in bowel movements, and indigestion. Genitourniary       Denies painful  urination, kidney stones, and loss of urinary control. Neurological       Denies paralysis, seizures, and fainting/blackouts. Musculoskeletal       Denies muscle pain, joint pain, joint stiffness, decreased range of motion, redness, swelling, muscle weakness, and gout.  Skin       Denies bruising, unusual mles/lumps or sores, and hair/skin or nail changes.  Psych       Denies mood changes, temper/anger issues,  anxiety/stress, speech problems, depression, and sleep problems. Other Comments: Pt states he has not followed up with PCP. Pt states he is not feeling any better.   Past History:  Past Medical History: Last updated: 09/22/2009 _spondilitis  Past Surgical History: Last updated: 09/22/2009 appendectomy  1989  tonsil and nose sx for snoring 2002 Appendectomy Tonsillectomy + adenoids  Family History: Last updated: 06/14/2006 alcoholism-grandparents  Depression, DM, HTN, hi chol-parent, stroke-GF  Social History: Last updated: 03/06/2006 [employer] for the [place] [employer] [place], HS degree.  Married to [employer].  Have one child.  Quit smoking 08/13/04.  No EtOH, no drugs, 8 caffeinated drinks/day, no reg exercise.  Risk Factors: Smoking Status: quit (06/14/2006)   Objective:  No acute distress, alert and oriented  Eyes:  Pupils are equal, round, and reactive to light and accomdation.  Extraocular movement is intact.  Conjunctivae are not inflamed.  Ears:  Canals normal.   Right tympanic membrane normal but left tympanic membrane has serous effusion Nose:   Left turbinates are congested, and there is left maxillary sinus tenderness Pharynx:  Normal, moist mucous membranes  Neck:  Supple.  No adenopathy is present.   Lungs:  Scattered rhonchi posteriorly; no rales.   Breath sounds are equal.  Heart:  Regular rate and rhythm without murmurs, rubs, or gallops.  Abdomen:  Nontender without masses or hepatosplenomegaly.  Bowel sounds are present.  No CVA or flank tenderness.  Extremities:  No edema. Chest X-ray:  No acute changes Assessment  Assessed BRONCHITIS, ACUTE WITH BRONCHOSPASM as deteriorated - Donna Christen MD Assessed ACUTE SINUSITIS, UNSPECIFIED as deteriorated - Donna Christen MD New Problems: OTITIS MEDIA, SEROUS, ACUTE, LEFT (ICD-381.01)   Plan New Medications/Changes: BENZONATATE 200 MG CAPS (BENZONATATE) One by mouth hs as needed cough  #12 x 0,  09/26/2009, Donna Christen MD  New Orders: T-Chest x-ray, 2 views [71020] Rocephin  250mg  [J0696] Est. Patient Level III [84696] Planning Comments:   Rocephin 1gm IM.  Request that patient discontinue Tussionex and begin taking Mucinex D during the day with plenty of fluids in order to facilitate a productive cough and decrease nasal congestion.  Continue Symbicort two times a day, ProAir Q4hr as needed.  Continue Augmentin. Follow-up with PCP if not improving 48 hours  (to ER if worse at night)   The patient and/or caregiver has been counseled thoroughly with regard to medications prescribed including dosage, schedule, interactions, rationale for use, and possible side effects and they verbalize understanding.  Diagnoses and expected course of recovery discussed and will return if not improved as expected or if the condition worsens. Patient and/or caregiver verbalized understanding.  Prescriptions: BENZONATATE 200 MG CAPS (BENZONATATE) One by mouth hs as needed cough  #12 x 0   Entered and Authorized by:   Donna Christen MD   Signed by:   Donna Christen MD on 09/26/2009   Method used:   Print then Give to Patient   RxID:   [id number]   Patient Instructions: 1)  Take  Mucinex D (guaifenesin with decongestant)  twice daily for congestion with plenty of fluids. 2)   May use Afrin nasal spray (or generic oxymetazoline) twice daily for about 5 days.  Also recommend using saline nasal spray several times daily and/or saline nasal irrigation. 3)  Stop taking Tussionex 4)  Continue taking Symbicort, ProAir, and Augmentin. 5)  Followup with family doctor if not improving 2 days.  Appended Document: chest congestion, wheezing x rm 2 Rocephin 1 gram IM R GLUT @ 5:14pm Lot: [id number]  Exp:04/27/2012  Manuf: Sandox Pt tolerated injection well.

## 2010-06-30 NOTE — Letter (Signed)
Summary: [facility]  [facility]   Imported By: Lanelle Bal 09/14/2009 11:43:00  _____________________________________________________________________  External Attachment:    Type:   Image     Comment:   External Document

## 2010-06-30 NOTE — Assessment & Plan Note (Signed)
Summary: Sinusitis    Vital Signs:  Patient profile:   50 year old male Height:      70.25 inches Weight:      236 pounds O2 Sat:      100 % on Room air Temp:     98.1 degrees F oral Pulse rate:   104 / minute BP sitting:   131 / 79  (left arm) Cuff size:   large  Vitals Entered By: Kathlene November (Sep 30, 2009 9:17 AM)  O2 Flow:  Room air CC: no energy, cough still, H/A, head feels "full"   Primary Care Provider:  Nani Gasser MD  CC:  no energy, cough still, H/A, and head feels "full".  History of Present Illness: no energy, cough still, H/A, head feels "full". Sxs started about 1 week ago but went to UC 4 days ago.  Given IM rocephin. On the augmentin for 7 days.  Felt better about 2 days ago but feels worse today.  ON Enbrel and methotrexate for ankylosing spondylitis. Did hold his doses the last 2 weeks. No fever. Given symbicort two times a day. Does feel 40% better.  Had PNA about 1.5 months ago.    Current Medications (verified): 1)  Imipramine Hcl 50 Mg Tabs (Imipramine Hcl) .... Take Four By Mouth, At Bedtime 2)  Methotrexate 2.5 Mg Tabs (Methotrexate Sodium) .... Injection Directed To Draw Up .8 Mgs By Ra Physician 3)  Enbrel Sureclick 50 Mg/ml Soln (Etanercept) .... As Directed 4)  Proair Hfa 108 (90 Base) Mcg/act  Aers (Albuterol Sulfate) .... 2 Inh Q4h As Needed Shortness of Breath 5)  Symbicort 160-4.5 Mcg/act  Aero (Budesonide-Formoterol Fumarate) .... 2 Inh Bid 6)  Benzonatate 200 Mg Caps (Benzonatate) .... One By Mouth Hs As Needed Cough  Allergies (verified): 1)  ! * Z-Pack  Comments:  Nurse/Medical Assistant: The patient's medications and allergies were reviewed with the patient and were updated in the Medication and Allergy Lists. Kathlene November (Sep 30, 2009 9:18 AM)  Physical Exam  General:  Well-developed,well-nourished,in no acute distress; alert,appropriate and cooperative throughout examination Head:  Normocephalic and atraumatic without  obvious abnormalities. No apparent alopecia or balding. Eyes:  No corneal or conjunctival inflammation noted. EOMI. Perrla.Wears glasses.  Ears:  External ear exam shows no significant lesions or deformities.  Otoscopic examination reveals clear canals, tympanic membranes are intact bilaterally without bulging, retraction, inflammation or discharge. Hearing is grossly normal bilaterally. Nose:  External nasal examination shows no deformity or inflammation. Nasal mucosa are pink and moist without lesions or exudates. Mouth:  Oral mucosa and oropharynx without lesions or exudates.  Teeth in good repair. Neck:  No deformities, masses, or tenderness noted. Lungs:  Normal respiratory effort, chest expands symmetrically. Lungs are clear to auscultation, no crackles or wheezes. Heart:  Normal rate and regular rhythm. S1 and S2 normal without gallop, murmur, click, rub or other extra sounds. Pulses:  RAdial 2+  Skin:  no rashes.   Cervical Nodes:  No lymphadenopathy noted Psych:  Cognition and judgment appear intact. Alert and cooperative with normal attention span and concentration. No apparent delusions, illusions, hallucinations   Impression & Recommendations:  Problem # 1:  ACUTE SINUSITIS, UNSPECIFIED (ICD-461.9)  Recommend extend ABX to 14 days.  Will refill cough med. If not better in one week then will change to a FQ. Given Depo-medrol 80mg  IM today.   The following medications were removed from the medication list:    Benzonatate 200 Mg Caps (Benzonatate) .Marland KitchenMarland KitchenMarland KitchenMarland Kitchen  One by mouth hs as needed cough His updated medication list for this problem includes:    Hydrocodone-homatropine 5-1.5 Mg/79ml Syrp (Hydrocodone-homatropine) .Marland KitchenMarland KitchenMarland KitchenMarland Kitchen 5ml by mouth at bedtime as needed cough    Augmentin 875-125 Mg Tabs (Amoxicillin-pot clavulanate) .Marland Kitchen... 1 by mouth 2 times daily    Tussionex Pennkinetic Er 8-10 Mg/67ml Lqcr (Chlorpheniramine-hydrocodone) ..... Sig 1 tsp by mouth twice aday  Orders: Depo- Medrol 80mg   (J1040) Admin of Therapeutic Inj  intramuscular or subcutaneous (57846)  Problem # 2:  OTITIS MEDIA, SEROUS, ACUTE, LEFT (ICD-381.01) Resolved.   Complete Medication List: 1)  Imipramine Hcl 50 Mg Tabs (Imipramine hcl) .... Take four by mouth, at bedtime 2)  Hydrocodone-homatropine 5-1.5 Mg/9ml Syrp (Hydrocodone-homatropine) .... 5ml by mouth at bedtime as needed cough 3)  Methotrexate 2.5 Mg Tabs (Methotrexate sodium) .... Injection directed to draw up .8 mgs by ra physician 4)  Enbrel Sureclick 50 Mg/ml Soln (Etanercept) .... As directed 5)  Augmentin 875-125 Mg Tabs (Amoxicillin-pot clavulanate) .Marland Kitchen.. 1 by mouth 2 times daily 6)  Tussionex Pennkinetic Er 8-10 Mg/35ml Lqcr (Chlorpheniramine-hydrocodone) .... Sig 1 tsp by mouth twice aday 7)  Proair Hfa 108 (90 Base) Mcg/act Aers (Albuterol sulfate) .... 2 inh q4h as needed shortness of breath 8)  Symbicort 160-4.5 Mcg/act Aero (Budesonide-formoterol fumarate) .... 2 inh bid  Patient Instructions: 1)  Call if not better in one week and will change to another antibiotic.  Prescriptions: AUGMENTIN 875-125 MG TABS (AMOXICILLIN-POT CLAVULANATE) 1 by mouth 2 times daily  #8 x 0   Entered and Authorized by:   Nani Gasser MD   Signed by:   Nani Gasser MD on 09/30/2009   Method used:   Print then Give to Patient   RxID:   [id number] HYDROCODONE-HOMATROPINE 5-1.5 MG/5ML SYRP (HYDROCODONE-HOMATROPINE) 5ml by mouth at bedtime as needed cough  #1104ml x 0   Entered and Authorized by:   Nani Gasser MD   Signed by:   Nani Gasser MD on 09/30/2009   Method used:   Print then Give to Patient   RxID:   [id number]    Medication Administration  Injection # 1:    Medication: Depo- Medrol 80mg     Diagnosis: ACUTE SINUSITIS, UNSPECIFIED (ICD-461.9)    Route: IM    Site: RUOQ gluteus    Exp Date: 03/29/2010    Lot #: [id number]    Mfr: [employer]    Patient tolerated injection without complications    Given by: Kathlene November (Sep 30, 2009 10:01 AM)  Orders Added: 1)  Depo- Medrol 80mg  [J1040] 2)  Admin of Therapeutic Inj  intramuscular or subcutaneous [96372] 3)  Est. Patient Level III [95638]

## 2010-06-30 NOTE — Letter (Signed)
Summary: [facility]  [facility]   Imported By: Lanelle Bal 03/03/2010 10:22:40  _____________________________________________________________________  External Attachment:    Type:   Image     Comment:   External Document

## 2010-06-30 NOTE — Letter (Signed)
Summary: [facility]  [facility]   Imported By: Lanelle Bal 06/23/2010 08:53:46  _____________________________________________________________________  External Attachment:    Type:   Image     Comment:   External Document

## 2010-06-30 NOTE — Assessment & Plan Note (Signed)
Summary: achy, low grade fever x 2 dys rm 3   Vital Signs:  Patient Profile:   50 Years Old Male CC:      Cold & URI symptoms Height:     70.25 inches (178.44 cm) Weight:      232 pounds O2 Sat:      99 % O2 treatment:    Room Air Temp:     97.1 degrees F oral Pulse rate:   118 / minute Pulse rhythm:   irregular Resp:     16 per minute BP sitting:   119 / 83  (right arm) Cuff size:   regular  Vitals Entered By: Areta Haber CMA (July 11, 2009 3:41 PM)                  Current Allergies: ! * Z-PACK   History of Present Illness Chief Complaint: Cold & URI symptoms History of Present Illness: Patient completed course of Avelox for pna 1 week ago. Woke up this am and had new fever cough and recurrence of symptoms.   Current Problems: THRUSH (ICD-112.0) BRONCHITIS (ICD-490) PNEUMONIA, ORGANISM UNSPECIFIED (ICD-486) COUGH (ICD-786.2) URI (ICD-465.9) ACUT SUPPRATV OTITIS MEDIA W/O SPONT RUP EARDRUM (ICD-382.00) KNEE PAIN, BILATERAL (ICD-719.46) WRIST PAIN, LEFT (ICD-719.43) HORDEOLUM INTERNUM (ICD-373.12) BRONCHITIS NOS (ICD-490) ELEVATED BLOOD PRESSURE WITHOUT DIAGNOSIS OF HYPERTENSION (ICD-796.2) VACCINE AGAINST INFLUENZA (ICD-V04.81) SINUSITIS, ACUTE NOS (ICD-461.9) OBSESSIVE COMPUL. DISORDER (ICD-300.3) OBESITY, NOS (ICD-278.00) HYPERCHOLESTEROLEMIA (ICD-272.0)   Current Meds IMIPRAMINE HCL 50 MG TABS (IMIPRAMINE HCL) take four by mouth, at bedtime FLUVOXAMINE MALEATE 100 MG TABS (FLUVOXAMINE MALEATE) twice a daytake one tablet by mouth HYDROCODONE-HOMATROPINE 5-1.5 MG/5ML SYRP (HYDROCODONE-HOMATROPINE) 5ml by mouth at bedtime as needed cough METHOTREXATE 2.5 MG TABS (METHOTREXATE SODIUM) injection directed to draw up .8 mgs by RA physician ENBREL SURECLICK 50 MG/ML SOLN (ETANERCEPT) As directed PROVENTIL HFA 108 (90 BASE) MCG/ACT AERS (ALBUTEROL SULFATE) 2 PUFFS QID as needed WHEEZING PROMETHAZINE-CODEINE 6.25-10 MG/5ML SYRP (PROMETHAZINE-CODEINE) 5 ml  by mouth q 6 hrs as needed cough FLUCONAZOLE 150 MG TABS (FLUCONAZOLE) 1 tab by mouth x 1 CHERATUSSIN AC 100-10 MG/5ML SYRP (GUAIFENESIN-CODEINE) Take 5ml by mouth q8 hours as needed cough DOXYCYCLINE HYCLATE 100 MG SOLR (DOXYCYCLINE HYCLATE) Take 1 tablet by mouth two times a day  REVIEW OF SYSTEMS Constitutional Symptoms       Complains of fever.     Denies chills, night sweats, weight loss, weight gain, and fatigue.      Comments: low grade Eyes       Denies change in vision, eye pain, eye discharge, glasses, contact lenses, and eye surgery. Ear/Nose/Throat/Mouth       Denies hearing loss/aids, change in hearing, ear pain, ear discharge, dizziness, frequent runny nose, frequent nose bleeds, sinus problems, sore throat, hoarseness, and tooth pain or bleeding.  Respiratory       Denies dry cough, productive cough, wheezing, shortness of breath, asthma, bronchitis, and emphysema/COPD.  Cardiovascular       Denies murmurs, chest pain, and tires easily with exhertion.    Gastrointestinal       Complains of nausea/vomiting.      Denies stomach pain, diarrhea, constipation, blood in bowel movements, and indigestion. Genitourniary       Denies painful urination, kidney stones, and loss of urinary control. Neurological       Denies paralysis, seizures, and fainting/blackouts. Musculoskeletal       Denies muscle pain, joint pain, joint stiffness, decreased range of motion, redness, swelling, muscle weakness, and gout.  Skin       Denies bruising, unusual mles/lumps or sores, and hair/skin or nail changes.  Psych       Denies mood changes, temper/anger issues, anxiety/stress, speech problems, depression, and sleep problems. Other Comments: x 2 dys still . Pt has gone back to work but started feeling bad two days ago.   Past History:  Past Medical History: Last updated: 03/06/2006 _  Past Surgical History: Last updated: 12/28/2006 appendectomy  1989  tonsil and nose sx for snoring  2002  Family History: Last updated: 06/14/2006 alcoholism-grandparents  Depression, DM, HTN, hi chol-parent, stroke-GF  Social History: Last updated: 03/06/2006 [employer] for the [employer], HS degree.  Married to [employer].  Have one child.  Quit smoking 08/13/04.  No EtOH, no drugs, 8 caffeinated drinks/day, no reg exercise.  Risk Factors: Smoking Status: quit (06/14/2006) Physical Exam General appearance: well developed, well nourished, no acute distress Head: normocephalic, atraumatic Eyes: conjunctivae and lids normal Nasal: swollen red turbinates with congestion Oral/Pharynx: poor dentation, thrush in mouth with redness, dry cracked lips in corners. Neck: neck supple,  trachea midline, no masses Chest/Lungs: no rales, wheezes, or rhonchi bilateral, breath sounds equal without effort Heart: regular rate and  rhythm, no murmur Abdomen: soft, non-tender without obvious organomegaly Assessment New Problems: THRUSH (ICD-112.0) BRONCHITIS (ICD-490)  Lungs are totally clear today. He has a deep bronchitic cough on inhalation. Noew onset fever. ?if possibly did not complete his abx and is having a recurrence.  Patient Education: Patient and/or caregiver instructed in the following: rest, fluids, Tylenol prn.  Plan New Medications/Changes: FLUCONAZOLE 150 MG TABS (FLUCONAZOLE) 1 tab by mouth x 1  #1 x 0, 07/11/2009, Beth Demetre Monaco  DO DOXYCYCLINE HYCLATE 100 MG SOLR (DOXYCYCLINE HYCLATE) Take 1 tablet by mouth two times a day  #20 x 0, 07/11/2009, Beth Ariea Rochin  DO CHERATUSSIN AC 100-10 MG/5ML SYRP (GUAIFENESIN-CODEINE) Take 5ml by mouth q8 hours as needed cough  #110ml x 0, 07/11/2009, Julaine Fusi  DO  New Orders: New Patient Level IV [phone] Planning Comments:   Will treat with 10 days of Doxycycline for bronchitis. He will retrun to his PCP if not improved. Will refill cough medication and 1 refill of diflucan for thrush.   The patient and/or caregiver has  been counseled thoroughly with regard to medications prescribed including dosage, schedule, interactions, rationale for use, and possible side effects and they verbalize understanding.  Diagnoses and expected course of recovery discussed and will return if not improved as expected or if the condition worsens. Patient and/or caregiver verbalized understanding.  Prescriptions: FLUCONAZOLE 150 MG TABS (FLUCONAZOLE) 1 tab by mouth x 1  #1 x 0   Entered and Authorized by:   Julaine Fusi  DO   Signed by:   Julaine Fusi  DO on 07/11/2009   Method used:   [facility] to        [facility] [phone]* (retail)       [address] [place], [place]  [address]       Ph: [phone] or [phone]       Fax: [phone]   RxID:   [id number] DOXYCYCLINE HYCLATE 100 MG SOLR (DOXYCYCLINE HYCLATE) Take 1 tablet by mouth two times a day  #20 x 0   Entered and Authorized by:   Julaine Fusi  DO   Signed by:   Julaine Fusi  DO on 07/11/2009   Method used:  [facility]ically to        [facility] [phone]* (retail)       [address] [place], [place]  [address]       Ph: [phone] or [phone]       Fax: [phone]   RxID:   [id number] CHERATUSSIN AC 100-10 MG/5ML SYRP (GUAIFENESIN-CODEINE) Take 5ml by mouth q8 hours as needed cough  #171ml x 0   Entered and Authorized by:   Julaine Fusi  DO   Signed by:   Julaine Fusi  DO on 07/11/2009   Method used:   Print then Give to Patient   RxID:   [id number]

## 2010-06-30 NOTE — Letter (Signed)
Summary: Out of Work  [facility]  [address], [address]   [place], [place] [address]   Phone: [phone]  Fax: [phone]    July 05, 2009   Employee:  Ernesto J Pehl    To Whom It May Concern:   For Medical reasons, please excuse the above named employee from work for the following dates:  Start:   06/30/2009  End:   07/06/2009  If you need additional information, please feel free to contact our office.         Sincerely,    Nani Gasser MD

## 2010-06-30 NOTE — Assessment & Plan Note (Signed)
Summary: Sinusitis/bronchitis   Vital Signs:  Patient profile:   50 year old male Height:      70.25 inches Weight:      234 pounds Temp:     98.4 degrees F oral Pulse rate:   79 / minute BP sitting:   135 / 79  (right arm) Cuff size:   large  Vitals Entered By: Avon Gully CMA, Duncan Dull) (May 03, 2010 3:52 PM) CC: cough,congestio, HA on the left side   Primary Care Jule Whitsel:  Nani Gasser MD  CC:  cough, congestio, and HA on the left side.  History of Present Illness: cough,congestio, HA on the left side.  Started about 5 days ago but feels he is getting worse. Nasal congestion and feels it is in his chest. Hx of COPD. Sweats and fever last night.  Pressure behind the left eye and ear. No ST. Not on any cough/coldmedications.   Now on MTX and Enbrel for his AS. Last MTX injection was Sunday.   Current Medications (verified): 1)  Imipramine Hcl 50 Mg Tabs (Imipramine Hcl) .... Take Four By Mouth, At Bedtime 2)  Methotrexate 2.5 Mg Tabs (Methotrexate Sodium) .... Injection Directed To Draw Up .8 Mgs By Ra Physician 3)  Enbrel Sureclick 50 Mg/ml Soln (Etanercept) .... As Directed  Allergies (verified): 1)  ! * Z-Pack  Comments:  Nurse/Medical Assistant: The patient's medications and allergies were reviewed with the patient and were updated in the Medication and Allergy Lists. Avon Gully CMA, Duncan Dull) (May 03, 2010 3:53 PM)  Social History: Reviewed history from 03/06/2006 and no changes required. [employer] for the [employer] [place] [employer], HS degree.  Married to [employer].  Have one child.  Quit smoking 08/13/04.  No EtOH, no drugs, 8 caffeinated drinks/day, no reg exercise.  Physical Exam  General:  Well-developed,well-nourished,in no acute distress; alert,appropriate and cooperative throughout examination Head:  Normocephalic and atraumatic without obvious abnormalities. No apparent alopecia or balding. Eyes:  No corneal or conjunctival  inflammation noted. EOMI. Perrla.  Ears:  External ear exam shows no significant lesions or deformities.  Otoscopic examination reveals clear canals, tympanic membranes are intact bilaterally without bulging, retraction, inflammation or discharge. Hearing is grossly normal bilaterally. Nose:  External nasal examination shows no deformity or inflammation.  Mouth:  Oral mucosa and oropharynx without lesions or exudates.  Teeth in good repair. Neck:  No deformities, masses, or tenderness noted. Lungs:  Normal respiratory effort, chest expands symmetrically. Rhonchiat the bases and prolonged expiration  Heart:  Normal rate and regular rhythm. S1 and S2 normal without gallop, murmur, click, rub or other extra sounds. Skin:  no rashes.   Cervical Nodes:  No lymphadenopathy noted Psych:  Cognition and judgment appear intact. Alert and cooperative with normal attention span and concentration. No apparent delusions, illusions, hallucinations   Impression & Recommendations:  Problem # 1:  SINUSITIS - ACUTE-NOS (ICD-461.9) Sinusitis and bronchitis. He is also immunosupressed. Start Bactrim and skii pnext MTX injection until feeling better.  Call if not better in one week or if becomes SOB.  His updated medication list for this problem includes:    Bactrim Ds 800-160 Mg Tabs (Sulfamethoxazole-trimethoprim) .Marland Kitchen... Take 1 tablet by mouth two times a day for 10 days  Complete Medication List: 1)  Imipramine Hcl 50 Mg Tabs (Imipramine hcl) .... Take four by mouth, at bedtime 2)  Methotrexate 2.5 Mg Tabs (Methotrexate sodium) .... Injection directed to draw up .8 mgs by ra physician 3)  Enbrel Sureclick 50 Mg/ml Soln (Etanercept) .... As directed 4)  Bactrim Ds 800-160 Mg Tabs (Sulfamethoxazole-trimethoprim) .... Take 1 tablet by mouth two times a day for 10 days 5)  Fluconazole 150 Mg Tabs (Fluconazole) .... Take 1 tablet by mouth once a day  Patient Instructions: 1)  call if not better in one  week Prescriptions: FLUCONAZOLE 150 MG TABS (FLUCONAZOLE) Take 1 tablet by mouth once a day  #1 x 0   Entered and Authorized by:   Nani Gasser MD   Signed by:   Nani Gasser MD on 05/03/2010   Method used:   [facility] to        [facility] [phone]* (retail)       [address] [place], [place]  [address]       Ph: [phone] or [phone]       Fax: [phone]   RxID:   [id number] BACTRIM DS 800-160 MG TABS (SULFAMETHOXAZOLE-TRIMETHOPRIM) Take 1 tablet by mouth two times a day for 10 days  #20 x 0   Entered and Authorized by:   Nani Gasser MD   Signed by:   Nani Gasser MD on 05/03/2010   Method used:   [facility]ically to        [facility] [phone]* (retail)       [address] [place], [place]  [address]       Ph: [phone] or [phone]       Fax: [phone]   RxID:   [id number]    Orders Added: 1)  Est. Patient Level III [427[address]]

## 2010-07-27 ENCOUNTER — Ambulatory Visit (INDEPENDENT_AMBULATORY_CARE_PROVIDER_SITE_OTHER): Payer: 59 | Admitting: Emergency Medicine

## 2010-07-27 ENCOUNTER — Encounter: Payer: Self-pay | Admitting: Emergency Medicine

## 2010-07-27 DIAGNOSIS — J069 Acute upper respiratory infection, unspecified: Secondary | ICD-10-CM

## 2010-07-29 ENCOUNTER — Ambulatory Visit (INDEPENDENT_AMBULATORY_CARE_PROVIDER_SITE_OTHER): Payer: 59 | Admitting: Family Medicine

## 2010-07-29 ENCOUNTER — Other Ambulatory Visit: Payer: Self-pay | Admitting: Family Medicine

## 2010-07-29 ENCOUNTER — Ambulatory Visit
Admission: RE | Admit: 2010-07-29 | Discharge: 2010-07-29 | Disposition: A | Discharge status: Home / Self Care | Payer: 59 | Source: Ambulatory Visit | Attending: Family Medicine | Admitting: Family Medicine

## 2010-07-29 ENCOUNTER — Encounter: Payer: Self-pay | Admitting: Family Medicine

## 2010-07-29 DIAGNOSIS — J069 Acute upper respiratory infection, unspecified: Secondary | ICD-10-CM

## 2010-07-29 DIAGNOSIS — R0981 Nasal congestion: Secondary | ICD-10-CM

## 2010-07-29 DIAGNOSIS — J0100 Acute maxillary sinusitis, unspecified: Secondary | ICD-10-CM

## 2010-08-04 NOTE — Assessment & Plan Note (Signed)
Summary: HEAD COLD(rm3)   Vital Signs:  Patient Profile:   50 Years Old Male CC:      Cold & URI symptoms Height:     70.25 inches (178.44 cm) Weight:      234 pounds (106.36 kg) O2 Sat:      100 % O2 treatment:    Room Air Temp:     98.8 degrees F (37.11 degrees C) oral Resp:     20 per minute BP sitting:   148 / 90  (left arm) Cuff size:   regular  Vitals Entered By: Linton Flemings RN (July 27, 2010 5:52 PM)                  Updated Prior Medication List: IMIPRAMINE HCL 50 MG TABS (IMIPRAMINE HCL) take four by mouth, at bedtime METHOTREXATE 2.5 MG TABS (METHOTREXATE SODIUM) injection directed to draw up .8 mgs by RA physician ENBREL SURECLICK 50 MG/ML SOLN (ETANERCEPT) As directed HYDROCODONE-ACETAMINOPHEN 2.5-500 MG TABS (HYDROCODONE-ACETAMINOPHEN)   Current Allergies (reviewed today): ! * Z-PACKHistory of Present Illness History from: patient Chief Complaint: Cold & URI symptoms History of Present Illness: 50 Years Old Male complains of onset of cold symptoms for 14 days.  Robert Frey has been using Sudafed which is helping a little bit. + sore throat + cough No pleuritic pain + wheezing + nasal congestion + post-nasal drainage + sinus pain/pressure + chest congestion No itchy/red eyes No earache No hemoptysis No SOB No chills/sweats No fever No nausea No vomiting No abdominal pain No diarrhea No skin rashes No fatigue No myalgias No headache   REVIEW OF SYSTEMS Constitutional Symptoms       Complains of fever and chills.     Denies night sweats, weight loss, weight gain, and fatigue.  Eyes       Complains of eye drainage and glasses.      Denies change in vision, eye pain, contact lenses, and eye surgery. Ear/Nose/Throat/Mouth       Complains of ear pain, frequent runny nose, sinus problems, and sore throat.      Denies hearing loss/aids, change in hearing, ear discharge, dizziness, frequent nose bleeds, hoarseness, and tooth pain or bleeding.    Respiratory       Complains of productive cough and wheezing.      Denies shortness of breath, asthma, bronchitis, and emphysema/COPD.  Cardiovascular       Denies murmurs, chest pain, and tires easily with exhertion.    Gastrointestinal       Denies stomach pain, nausea/vomiting, diarrhea, constipation, blood in bowel movements, and indigestion. Genitourniary       Denies painful urination, kidney stones, and loss of urinary control. Neurological       Complains of headaches.      Denies paralysis, seizures, and fainting/blackouts. Musculoskeletal       Denies muscle pain, joint pain, joint stiffness, decreased range of motion, redness, swelling, muscle weakness, and gout.  Skin       Denies bruising, unusual mles/lumps or sores, and hair/skin or nail changes.  Psych       Denies mood changes, temper/anger issues, anxiety/stress, speech problems, depression, and sleep problems. Other Comments: symptoms started two weeks ago w/o relief from OTC meds   Past History:  Past Medical History: Spondilitis ALS  Past Surgical History: Appendectomy Tonsillectomy + adenoids  Family History: Reviewed history from 06/14/2006 and no changes required. alcoholism-grandparents  Depression, DM, HTN, hi chol-parent, stroke-GF  Social History: Reviewed history from  03/06/2006 and no changes required. [employer] for the [employer] [place], HS degree.  Married to [employer].  Have one child.  Quit smoking 08/13/04.  No EtOH, no drugs, 8 caffeinated drinks/day, no reg exercise. Physical Exam General appearance: well developed, well nourished, no acute distress Head: maxillary sinus tenderness Ears: normal, no lesions or deformities Nasal: mucosa pink, nonedematous, no septal deviation, turbinates normal Oral/Pharynx: tongue normal, posterior pharynx without erythema or exudate Neck: ant cerv LAD tender Chest/Lungs: end expiratory wheezes, otherwise clear Heart: regular rate and   rhythm, no murmur MSE: oriented to time, place, and person Assessment New Problems: UPPER RESPIRATORY INFECTION, ACUTE (ICD-465.9)   Plan New Medications/Changes: Sandria Senter ER 10-8 MG/5ML LQCR (HYDROCOD POLST-CHLORPHEN POLST) 5cc at bedtime as needed cough  #4oz x 0, 07/27/2010, Hoyt Koch MD AUGMENTIN [phone] MG TABS (AMOXICILLIN-POT CLAVULANATE) 1 by mouth two times a day for 10 days  #20 x 0, 07/27/2010, Hoyt Koch MD  New Orders: Est. Patient Level IV [81191] Pulse Oximetry (single measurment) [47829] Planning Comments:   1)  Take the prescribed antibiotic as instructed. 2)  Use nasal saline solution (over the counter) at least 3 times a day. 3)  Use over the counter decongestants like Zyrtec-D every 12 hours as needed to help with congestion. 4)  Can take tylenol every 6 hours or motrin every 8 hours for pain or fever. 5)  Follow up with your primary doctor  if no improvement in 5-7 days, sooner if increasing pain, fever, or new symptoms.    The patient and/or caregiver has been counseled thoroughly with regard to medications prescribed including dosage, schedule, interactions, rationale for use, and possible side effects and they verbalize understanding.  Diagnoses and expected course of recovery discussed and will return if not improved as expected or if the condition worsens. Patient and/or caregiver verbalized understanding.  Prescriptions: Sandria Senter ER 10-8 MG/5ML LQCR (HYDROCOD POLST-CHLORPHEN POLST) 5cc at bedtime as needed cough  #4oz x 0   Entered and Authorized by:   Hoyt Koch MD   Signed by:   Hoyt Koch MD on 07/27/2010   Method used:   Print then Give to Patient   RxID:   [id number] AUGMENTIN 875-125 MG TABS (AMOXICILLIN-POT CLAVULANATE) 1 by mouth two times a day for 10 days  #20 x 0   Entered and Authorized by:   Hoyt Koch MD   Signed by:   Hoyt Koch MD on 07/27/2010   Method used:   Print  then Give to Patient   RxID:   [id number]   Orders Added: 1)  Est. Patient Level IV [53664] 2)  Pulse Oximetry (single measurment) [40347]

## 2010-08-04 NOTE — Letter (Signed)
Summary: Out of Work  [facility]  [address]   [place], [place] [address]   Phone: [phone]  Fax: [phone]    July 27, 2010   Employee:  Clayburn J Caponigro    To Whom It May Concern:   For Medical reasons, please excuse the above named employee from work for the following dates:  Feb 29th, 2012          Sincerely,    Hoyt Koch MD

## 2010-08-04 NOTE — Letter (Signed)
Summary: Out of Work  [facility]  [address]   [place], [place] [address]   Phone: [phone]  Fax: [phone]    July 29, 2010   Employee:  Bernell J Josey    To Whom It May Concern:   Dontarious Schaum was evaluated in our clinic this afternoon.      If you need additional information, please feel free to contact our office.         Sincerely,    Donna Christen MD

## 2010-08-06 ENCOUNTER — Encounter: Payer: Self-pay | Admitting: Emergency Medicine

## 2010-08-06 ENCOUNTER — Ambulatory Visit (INDEPENDENT_AMBULATORY_CARE_PROVIDER_SITE_OTHER): Payer: 59 | Admitting: Emergency Medicine

## 2010-08-06 DIAGNOSIS — J0100 Acute maxillary sinusitis, unspecified: Secondary | ICD-10-CM

## 2010-08-08 ENCOUNTER — Telehealth: Payer: Self-pay | Admitting: Family Medicine

## 2010-08-09 NOTE — Assessment & Plan Note (Signed)
Summary: cough/fever/runny nose rm 5   Vital Signs:  Patient Profile:   50 Years Old Male CC:      Cold & URI symptoms Height:     70.25 inches (178.44 cm) Weight:      238.75 pounds O2 Sat:      99 % O2 treatment:    Room Air Temp:     98.1 degrees F axillary Pulse rate:   87 / minute Resp:     20 per minute BP sitting:   125 / 83  (left arm) Cuff size:   regular  Vitals Entered By: Clemens Catholic LPN (July 28, 1608 12:38 PM)                  Updated Prior Medication List: IMIPRAMINE HCL 50 MG TABS (IMIPRAMINE HCL) take four by mouth, at bedtime METHOTREXATE 2.5 MG TABS (METHOTREXATE SODIUM) injection directed to draw up .8 mgs by RA physician ENBREL SURECLICK 50 MG/ML SOLN (ETANERCEPT) As directed HYDROCODONE-ACETAMINOPHEN 2.5-500 MG TABS (HYDROCODONE-ACETAMINOPHEN)  AUGMENTIN 875-125 MG TABS (AMOXICILLIN-POT CLAVULANATE) 1 by mouth two times a day for 10 days TUSSIONEX PENNKINETIC ER 10-8 MG/5ML LQCR (HYDROCOD POLST-CHLORPHEN POLST) 5cc at bedtime as needed cough  Current Allergies (reviewed today): ! * Z-PACKHistory of Present Illness Chief Complaint: Cold & URI symptoms History of Present Illness:  Subjective: Patient complains of persistent sinus congestion and facial pressure, worse on right. + cough worse at night No pleuritic pain No wheezing + post-nasal drainage No itchy/red eyes No earache but ears feel clogged No hemoptysis No SOB No fever/chills today No nausea No vomiting No abdominal pain No diarrhea No skin rashes + fatigue No myalgias + headache     REVIEW OF SYSTEMS Constitutional Symptoms       Complains of fever, chills, and night sweats.     Denies weight loss, weight gain, and fatigue.  Eyes       Denies change in vision, eye pain, eye discharge, glasses, contact lenses, and eye surgery. Ear/Nose/Throat/Mouth       Complains of ear pain, frequent runny nose, and sore throat.      Denies hearing loss/aids, change in hearing, ear  discharge, dizziness, frequent nose bleeds, sinus problems, hoarseness, and tooth pain or bleeding.  Respiratory       Complains of dry cough and wheezing.      Denies productive cough, shortness of breath, asthma, bronchitis, and emphysema/COPD.  Cardiovascular       Denies murmurs, chest pain, and tires easily with exhertion.    Gastrointestinal       Denies stomach pain, nausea/vomiting, diarrhea, constipation, blood in bowel movements, and indigestion. Genitourniary       Denies painful urination, kidney stones, and loss of urinary control. Neurological       Complains of headaches.      Denies paralysis, seizures, and fainting/blackouts. Musculoskeletal       Complains of muscle pain and joint pain.      Denies joint stiffness, decreased range of motion, redness, swelling, muscle weakness, and gout.  Skin       Denies bruising, unusual mles/lumps or sores, and hair/skin or nail changes.  Psych       Denies mood changes, temper/anger issues, anxiety/stress, speech problems, depression, and sleep problems. Other Comments: pt states that he is worse since his visit on 07/27/10. he c/o body aches, eyes hurt, nasal congestion, ears clogged, and fever.   Past History:  Past Medical History: Spondilitis ALS COPD  Past Surgical History: Reviewed history from 07/27/2010 and no changes required. Appendectomy Tonsillectomy + adenoids  Family History: Reviewed history from 06/14/2006 and no changes required. alcoholism-grandparents  Depression, DM, HTN, hi chol-parent, stroke-GF  Social History: Reviewed history from 03/06/2006 and no changes required. [employer] for the [employer], HS degree.  Married to [employer].  Have one child.  Quit smoking 08/13/04.  No EtOH, no drugs, 8 caffeinated drinks/day, no reg exercise.   Objective:  No acute distress  Eyes:  Pupils are equal, round, and reactive to light and accomdation.  Extraocular movement is intact.   Conjunctivae are not inflamed.  Ears:  Canals normal.  Tympanic membranes normal.   Nose:   Congested turbinates.  Maxillary sinus tenderness present.  Pharynx:  Normal  Neck:  Supple.  No adenopathy is present.  No thyromegaly is present  Lungs:  Clear to auscultation.  Breath sounds are equal.  Heart:  Regular rate and rhythm without murmurs, rubs, or gallops.  Abdomen:  Nontender without masses or hepatosplenomegaly.  Bowel sounds are present.  No CVA or flank tenderness.  Extremities:  No edema.  Skin:  No rash Sinus X-rays:  IMPRESSION:  1.  No acute paranasal sinusitis findings. 2.  Possible mild chronic sinusitis bilateral inferior maxillary antra. Assessment New Problems: ACUTE MAXILLARY SINUSITIS (ICD-461.0)   Plan New Medications/Changes: BENZONATATE 200 MG CAPS (BENZONATATE) One by mouth hs as needed cough  #12 x 0, 07/29/2010, Donna Christen MD FLUTICASONE PROPIONATE 50 MCG/ACT SUSP (FLUTICASONE PROPIONATE) 2 sprays in each nostril once daily  #One x 0, 07/29/2010, Donna Christen MD PREDNISONE 10 MG TABS (PREDNISONE) 2 PO BID for 2 days, then 1 BID for 2 days, then 1 daily for 2 days.  Take PC  #14 x 0, 07/29/2010, Donna Christen MD  New Orders: T-DG Sinuses Complete [70220] Pulse Oximetry (single measurment) [94760] Est. Patient Level IV [81191] Planning Comments:   Continue Augmentin, expectorant/decongestant.  Switch from Tussionex to benzonatate at bedtime.   Increase fluid intake Begin tapering course of prednisone.  Add topical decongestant and fluticasone nasal spray.  Add saline nasal irrigation. Followup with ENT if not improving 7 to 10 days   The patient and/or caregiver has been counseled thoroughly with regard to medications prescribed including dosage, schedule, interactions, rationale for use, and possible side effects and they verbalize understanding.  Diagnoses and expected course of recovery discussed and will return if not improved as expected or if  the condition worsens. Patient and/or caregiver verbalized understanding.  Prescriptions: BENZONATATE 200 MG CAPS (BENZONATATE) One by mouth hs as needed cough  #12 x 0   Entered and Authorized by:   Donna Christen MD   Signed by:   Donna Christen MD on 07/29/2010   Method used:   Print then Give to Patient   RxID:   [id number] FLUTICASONE PROPIONATE 50 MCG/ACT SUSP (FLUTICASONE PROPIONATE) 2 sprays in each nostril once daily  #One x 0   Entered and Authorized by:   Donna Christen MD   Signed by:   Donna Christen MD on 07/29/2010   Method used:   Print then Give to Patient   RxID:   [id number] PREDNISONE 10 MG TABS (PREDNISONE) 2 PO BID for 2 days, then 1 BID for 2 days, then 1 daily for 2 days.  Take PC  #14 x 0   Entered and Authorized by:   Donna Christen MD   Signed by:   Donna Christen MD on 07/29/2010  Method used:   Print then Give to Patient   RxID:   [id number]   Patient Instructions: 1)  Take Mucinex  (guaifenesin) and Sudafed twice daily for congestion. 2)  Increase fluid intake, rest. 3)  Continue Augmentin. 4)  May use Afrin nasal spray (or generic oxymetazoline) twice daily for about 5 days (use the Afrin about 15 minutes before prescription nasal spray).  Also recommend using saline nasal spray several times daily and/or saline nasal irrigation with a Neti pot. 5)  Stop Tussionex for cough and use benzonatate at bedtime. 6)  Followup with ENT physician if not improving 7 to 10 days.   Orders Added: 1)  T-DG Sinuses Complete [70220] 2)  Pulse Oximetry (single measurment) [94760] 3)  Est. Patient Level IV [09381]

## 2010-08-09 NOTE — Assessment & Plan Note (Signed)
Vital Signs:  Patient Profile:   50 Years Old Male CC:      Cold & URI symptoms Height:     70.25 inches (178.44 cm) Weight:      230 pounds O2 Sat:      99 % O2 treatment:    Room Air Temp:     98.1 degrees F oral Pulse rate:   107 / minute Resp:     20 per minute BP sitting:   158 / 90  (left arm) Cuff size:   regular  Vitals Entered By: Linton Flemings RN (August 06, 2010 1:01 PM)                  Updated Prior Medication List: IMIPRAMINE HCL 50 MG TABS (IMIPRAMINE HCL) take four by mouth, at bedtime METHOTREXATE 2.5 MG TABS (METHOTREXATE SODIUM) injection directed to draw up .8 mgs by RA physician ENBREL SURECLICK 50 MG/ML SOLN (ETANERCEPT) As directed HYDROCODONE-ACETAMINOPHEN 2.5-500 MG TABS (HYDROCODONE-ACETAMINOPHEN)  AUGMENTIN 875-125 MG TABS (AMOXICILLIN-POT CLAVULANATE) 1 by mouth two times a day for 10 days TUSSIONEX PENNKINETIC ER 10-8 MG/5ML LQCR (HYDROCOD POLST-CHLORPHEN POLST) 5cc at bedtime as needed cough PREDNISONE 10 MG TABS (PREDNISONE) 2 PO BID for 2 days, then 1 BID for 2 days, then 1 daily for 2 days.  Take PC FLUTICASONE PROPIONATE 50 MCG/ACT SUSP (FLUTICASONE PROPIONATE) 2 sprays in each nostril once daily BENZONATATE 200 MG CAPS (BENZONATATE) One by mouth hs as needed cough  Current Allergies (reviewed today): ! * Z-PACKHistory of Present Illness History from: patient Chief Complaint: Cold & URI symptoms History of Present Illness: 50 Years Old Male complains of onset of cold symptoms for 2 weeks.  Robert Frey has been on Augmentin, Flonase, Prednisone, Tussionex, Sudafed, antihistamine, Mucinex, and Tessalon.  He felt better, then stopped the antibiotics because they were gone, and within a day felt bad again.  Facial pressure, drainage, cough. No sore throat + cough No pleuritic pain No wheezing + nasal congestion + post-nasal drainage + sinus pain/pressure No chest congestion No itchy/red eyes No earache No hemoptysis No SOB No  chills/sweats No fever No nausea No vomiting No abdominal pain No diarrhea No skin rashes No fatigue No myalgias No headache   REVIEW OF SYSTEMS Constitutional Symptoms       Complains of fever.     Denies chills, night sweats, weight loss, weight gain, and fatigue.  Eyes       Denies change in vision, eye pain, eye discharge, glasses, contact lenses, and eye surgery. Ear/Nose/Throat/Mouth       Complains of frequent runny nose, sinus problems, sore throat, and hoarseness.      Denies hearing loss/aids, change in hearing, ear pain, ear discharge, dizziness, frequent nose bleeds, and tooth pain or bleeding.  Respiratory       Complains of dry cough and wheezing.      Denies productive cough, shortness of breath, asthma, bronchitis, and emphysema/COPD.  Cardiovascular       Denies murmurs, chest pain, and tires easily with exhertion.    Gastrointestinal       Denies stomach pain, nausea/vomiting, diarrhea, constipation, blood in bowel movements, and indigestion. Genitourniary       Denies painful urination, kidney stones, and loss of urinary control. Neurological       Complains of headaches.      Denies paralysis, seizures, and fainting/blackouts. Musculoskeletal       Denies muscle pain, joint pain, joint stiffness, decreased range of motion, redness,  swelling, muscle weakness, and gout.  Skin       Denies bruising, unusual mles/lumps or sores, and hair/skin or nail changes.  Psych       Denies mood changes, temper/anger issues, anxiety/stress, speech problems, depression, and sleep problems. Other Comments: symptoms x 1 month w/out relief fromABT/OTC meds.   Past History:  Past Medical History: Reviewed history from 07/29/2010 and no changes required. Spondilitis ALS COPD  Past Surgical History: Reviewed history from 07/27/2010 and no changes required. Appendectomy Tonsillectomy + adenoids  Family History: Reviewed history from 06/14/2006 and no changes  required. alcoholism-grandparents  Depression, DM, HTN, hi chol-parent, stroke-GF  Social History: Reviewed history from 03/06/2006 and no changes required. [employer] for the [employer], HS degree.  Married to [employer].  Have one child.  Quit smoking 08/13/04.  No EtOH, no drugs, 8 caffeinated drinks/day, no reg exercise. Physical Exam General appearance: well developed, well nourished, no acute distress Head: maxillary pressure Ears: normal, no lesions or deformities Nasal: clear discharge Oral/Pharynx: tongue normal, posterior pharynx without erythema or exudate Chest/Lungs: no rales, wheezes, or rhonchi bilateral, breath sounds equal without effort Heart: regular rate and  rhythm, no murmur MSE: oriented to time, place, and person  Plan New Medications/Changes: Robert Frey ER 10-8 MG/5ML LQCR (HYDROCOD POLST-CHLORPHEN POLST) 5cc two times a day as needed  #4oz x 0, 08/06/2010, Hoyt Koch MD LEVAQUIN 500 MG TABS (LEVOFLOXACIN) 1 by mouth daily for 1 week  #7 x 0, 08/06/2010, Hoyt Koch MD  New Orders: Est. Patient Level IV [16109] Pulse Oximetry (single measurment) [60454] Depo- Medrol 40mg  [J1030] Admin of Therapeutic Inj  intramuscular or subcutaneous [96372] Planning Comments:   Previous Xray last week showed mucosal thickening (possible chronic sinusitis).  Because of that and failing treatment, would like him to see ENT this week.  I don't think we can do anything further here and I let the patient know that I will place on Levaquin, give shot, and refer him to ENT. 1)  Take the prescribed antibiotic as instructed. 2)  Use nasal saline solution (over the counter) at least 3 times a day. 3)  Use over the counter decongestants like Zyrtec-D every 12 hours as needed to help with congestion. 4)  Can take tylenol every 6 hours or motrin every 8 hours for pain or fever.   The patient and/or caregiver has been counseled thoroughly with  regard to medications prescribed including dosage, schedule, interactions, rationale for use, and possible side effects and they verbalize understanding.  Diagnoses and expected course of recovery discussed and will return if not improved as expected or if the condition worsens. Patient and/or caregiver verbalized understanding.  Prescriptions: Robert Frey ER 10-8 MG/5ML LQCR (HYDROCOD POLST-CHLORPHEN POLST) 5cc two times a day as needed  #4oz x 0   Entered and Authorized by:   Hoyt Koch MD   Signed by:   Hoyt Koch MD on 08/06/2010   Method used:   Print then Give to Patient   RxID:   [id number] LEVAQUIN 500 MG TABS (LEVOFLOXACIN) 1 by mouth daily for 1 week  #7 x 0   Entered and Authorized by:   Hoyt Koch MD   Signed by:   Hoyt Koch MD on 08/06/2010   Method used:   Print then Give to Patient   RxID:   [id number]   Medication Administration  Injection # 1:    Medication: Depo- Medrol 40mg     Diagnosis: ACUTE MAXILLARY SINUSITIS (ICD-461.0)  Route: IM    Site: RUOQ gluteus    Exp Date: 10/28/2010    Lot #: [id number]    Mfr: [employer]    Patient tolerated injection without complications    Given by: Linton Flemings RN (August 06, 2010 1:38 PM)  Orders Added: 1)  Est. Patient Level IV [40981] 2)  Pulse Oximetry (single measurment) [94760] 3)  Depo- Medrol 40mg  [J1030] 4)  Admin of Therapeutic Inj  intramuscular or subcutaneous [19147]

## 2010-08-11 ENCOUNTER — Encounter: Payer: Self-pay | Admitting: Family Medicine

## 2010-08-12 ENCOUNTER — Telehealth: Payer: Self-pay | Admitting: Family Medicine

## 2010-08-16 ENCOUNTER — Encounter: Payer: Self-pay | Admitting: *Deleted

## 2010-08-16 NOTE — Progress Notes (Signed)
  Phone Note Refill Request Message from:  Fax from Pharmacy on August 08, 2010 5:09 PM  Refills Requested: Medication #1:  IMIPRAMINE HCL 50 MG TABS take four by mouth Initial call taken by: Avon Gully CMA, Duncan Dull),  August 08, 2010 5:10 PM    Prescriptions: IMIPRAMINE HCL 50 MG TABS (IMIPRAMINE HCL) take four by mouth, at bedtime  #120 Tablet x 3   Entered by:   Avon Gully CMA, (AAMA)   Authorized by:   Nani Gasser MD   Signed by:   Avon Gully CMA, (AAMA) on 08/09/2010   Method used:   Electronically to        [facility] (retail)       [address] [place], [place]  [address]       Ph: [phone]       Fax: [phone]   RxID:   [id number]

## 2010-08-16 NOTE — Progress Notes (Signed)
Summary: meds  Phone Note Call from Patient   Caller: Patient Call For: Nani Gasser MD Summary of Call: Pt called and states he was seen at urgent care and they suggested he see an ENT.Pt would like a refferal to ENT Initial call taken by: Avon Gully CMA, Duncan Dull),  August 08, 2010 1:35 PM

## 2010-08-16 NOTE — Letter (Signed)
Summary: Consult Scheduled [facility]  [address]   [place], [place] [address]   Phone: [phone]  Fax: [phone]    08/11/2010 MRN: [id number]    Dear Mr. Elias,      We have scheduled an appointment for you.  At the recommendation of Dr.Metheney, we have scheduled you a consult with Dr. Moshe Cipro on April 10 at 1:45.  Their phone number is [phone] and their address is [address] [place].  If this appointment day and time is not convenient for you, please feel free to call the office of the doctor you are being referred to at the number listed above and reschedule the appointment.     Thank you,  [facility]

## 2010-08-16 NOTE — Letter (Signed)
Summary: Consult Scheduled [facility]  [address]   [place], [place] [address]   Phone: [phone]  Fax: [phone]    08/11/2010 MRN: [id number]    Dear Mr. Ryer,      We have scheduled an appointment for you.  At the recommendation of Dr. Linford Arnold, we have scheduled you a consult with Dr. Moshe Cipro with [facility] on April 10th at 1:45.  Their address is [address], [place] and their phone number is [phone].  If this appointment day and time is not convenient for you, please feel free to call the office of the doctor you are being referred to at the number listed above and reschedule the appointment.     Thank you,  [facility]

## 2010-08-16 NOTE — Progress Notes (Signed)
Summary: Cough syrup refill & Levoquin question  Phone Note Call from Patient Call back at [phone] Message from:  Patient on August 12, 2010 8:17 AM  Refills Requested: Medication #1:  Sandria Senter ER 10-8 MG/5ML LQCR 5cc two times a day as needed. [facility] [address] Chinita Greenland [phone]   Method Requested: Electronic Caller: Patient Call For: Nani Gasser MD Summary of Call: Pt wants to know if he needs to take more than 7 days of Levoquin , says he is much better so just wants to make sure he doesn't need to continue med Initial call taken by: Lannette Donath,  August 12, 2010 8:20 AM  Follow-up for Phone Call        pt does not need anymore abx pt states he is feeling better Follow-up by: Avon Gully CMA, Duncan Dull),  August 12, 2010 4:57 PM

## 2010-09-02 ENCOUNTER — Encounter: Payer: Self-pay | Admitting: Emergency Medicine

## 2010-09-02 ENCOUNTER — Inpatient Hospital Stay (INDEPENDENT_AMBULATORY_CARE_PROVIDER_SITE_OTHER)
Admission: RE | Admit: 2010-09-02 | Discharge: 2010-09-02 | Disposition: A | Discharge status: Home / Self Care | Payer: 59 | Source: Ambulatory Visit | Attending: Emergency Medicine | Admitting: Emergency Medicine

## 2010-09-02 DIAGNOSIS — J069 Acute upper respiratory infection, unspecified: Secondary | ICD-10-CM

## 2010-09-14 ENCOUNTER — Telehealth (INDEPENDENT_AMBULATORY_CARE_PROVIDER_SITE_OTHER): Payer: Self-pay | Admitting: *Deleted

## 2010-09-15 ENCOUNTER — Encounter: Payer: Self-pay | Admitting: Family Medicine

## 2010-09-15 ENCOUNTER — Ambulatory Visit (INDEPENDENT_AMBULATORY_CARE_PROVIDER_SITE_OTHER): Payer: 59 | Admitting: Family Medicine

## 2010-09-15 VITALS — BP 128/86 | HR 81 | Temp 98.2°F | Ht 70.25 in | Wt 234.0 lb

## 2010-09-15 DIAGNOSIS — J329 Chronic sinusitis, unspecified: Secondary | ICD-10-CM

## 2010-09-15 MED ORDER — FLUTICASONE FUROATE 27.5 MCG/SPRAY NA SUSP: 2.0000 | Freq: Every day | NASAL | Status: DC

## 2010-09-15 MED ORDER — AMOXICILLIN-POT CLAVULANATE 875-125 MG PO TABS: 1.0000 | ORAL_TABLET | Freq: Two times a day (BID) | ORAL | Status: AC

## 2010-09-15 MED ORDER — HYDROCODONE-HOMATROPINE 5-1.5 MG/5ML PO SYRP: 5.0000 mL | ORAL_SOLUTION | Freq: Every evening | ORAL | Status: AC | PRN

## 2010-09-15 NOTE — Progress Notes (Signed)
  Subjective:    Patient ID: Robert Frey, male    DOB: 09-08-1960, 50 y.o.   MRN: [id number]  HPI Robert Frey to ENT on Monday for recurrent sinusitis and was feeling well. He said he he actually did well for about a week at that time. Next day (3 days ago) started with severe nasal congestion. Has tried multiple times to get in touch with the ENT.  Was told they would get a CT scan but has not been called back actually have this scheduled.Marland Kitchen He is recurrently getting what we think is sinusitis. Will only feel better for 2 weeks at a time.  Sometimes wakes up with sweats but not fever.  Slight HA, ST Bilateral ear itching.  Used a nasal spray this afternoon and feels better (affrin).  Using about twice a day.  Restarted that on Tuesday.  He is not on any allergy medications or any nasal steroid sprays at this time. He feels that he is starting to get phlegm in his upper chest and says he will notice noises when he lays down flat at night.  Review of Systems     Objective:   Physical Exam  Constitutional: He appears well-developed and well-nourished.  HENT:  Head: Normocephalic and atraumatic.  Right Ear: External ear normal.  Left Ear: External ear normal.  Mouth/Throat: Oropharynx is clear and moist.       His turbinates are very swollen. And pale. He is tender over the maxillary sinuses.  Eyes: Conjunctivae are normal. Pupils are equal, round, and reactive to light.  Neck: Neck supple. No thyromegaly present.  Cardiovascular: Normal rate, regular rhythm and normal heart sounds.   Pulmonary/Chest: Effort normal and breath sounds normal.  Lymphadenopathy:    He has no cervical adenopathy.  Skin: Skin is warm and dry.  Psychiatric: He has a normal mood and affect.          Assessment & Plan:  Recurrent sinusitis most likely complicated by allergies. I discussed that we will go and try to get him set up for limited sinus CT for further evaluation to see if he does ask to have an acute or  more chronic sinusitis or if the sinus cavities are clear. Certainly in the meantime I recommend he consider taking an over-the-counter antihistamine such as Claritin Zyrtec or Allegra. I also gave him a sample of dermis to start using. I warned him about using the Afrin too much that can cause rebound congestion. I did encourage him to still try to continue to followup with ENT.

## 2010-09-15 NOTE — Patient Instructions (Signed)
You should here from [facility].

## 2010-09-16 ENCOUNTER — Telehealth: Payer: Self-pay | Admitting: Family Medicine

## 2010-09-16 ENCOUNTER — Ambulatory Visit
Admission: RE | Admit: 2010-09-16 | Discharge: 2010-09-16 | Disposition: A | Discharge status: Home / Self Care | Payer: 59 | Source: Ambulatory Visit | Attending: Family Medicine | Admitting: Family Medicine

## 2010-09-16 DIAGNOSIS — J329 Chronic sinusitis, unspecified: Secondary | ICD-10-CM

## 2010-09-16 NOTE — Telephone Encounter (Signed)
Call patient: He had mucosal thickening and several sinus spaces. He did not necessarily have sinuses with fluid buildup to indicate a sinusitis. At this point he can go ahead and complete the antibiotic that I gave him. I do recommend he call and go ahead and schedule an ENT appointment for next week or the week after. We will send a copy of the scan over to [facility]. Elon Jester you  fax a copy of this over to them, thank you

## 2010-09-16 NOTE — Telephone Encounter (Signed)
Pt notified.will fax copy of report to [facility]

## 2010-10-14 ENCOUNTER — Ambulatory Visit: Payer: Self-pay

## 2010-10-14 ENCOUNTER — Other Ambulatory Visit: Payer: Self-pay | Admitting: Occupational Medicine

## 2010-10-14 DIAGNOSIS — R52 Pain, unspecified: Secondary | ICD-10-CM

## 2010-11-25 ENCOUNTER — Inpatient Hospital Stay (INDEPENDENT_AMBULATORY_CARE_PROVIDER_SITE_OTHER)
Admission: RE | Admit: 2010-11-25 | Discharge: 2010-11-25 | Disposition: A | Discharge status: Home / Self Care | Payer: 59 | Source: Ambulatory Visit | Attending: Emergency Medicine | Admitting: Emergency Medicine

## 2010-11-25 ENCOUNTER — Encounter: Payer: Self-pay | Admitting: Emergency Medicine

## 2010-11-25 DIAGNOSIS — R05 Cough: Secondary | ICD-10-CM

## 2010-11-25 DIAGNOSIS — J069 Acute upper respiratory infection, unspecified: Secondary | ICD-10-CM

## 2010-11-25 DIAGNOSIS — R059 Cough, unspecified: Secondary | ICD-10-CM

## 2010-12-19 ENCOUNTER — Other Ambulatory Visit: Payer: Self-pay | Admitting: *Deleted

## 2010-12-19 MED ORDER — IMIPRAMINE HCL 50 MG PO TABS: ORAL_TABLET | ORAL | Status: DC

## 2010-12-22 ENCOUNTER — Other Ambulatory Visit: Payer: Self-pay | Admitting: Family Medicine

## 2010-12-22 NOTE — Telephone Encounter (Signed)
Request received for refill of imipramine 50 mg.  Denied.  #120/1 refill was sent on 12-19-10 to the pharm. Jarvis Newcomer, LPN [facility]

## 2011-02-09 ENCOUNTER — Ambulatory Visit (INDEPENDENT_AMBULATORY_CARE_PROVIDER_SITE_OTHER): Payer: 59 | Admitting: Family Medicine

## 2011-02-09 ENCOUNTER — Encounter: Payer: Self-pay | Admitting: Family Medicine

## 2011-02-09 VITALS — BP 140/85 | HR 116 | Temp 98.6°F | Wt 228.0 lb

## 2011-02-09 DIAGNOSIS — J069 Acute upper respiratory infection, unspecified: Secondary | ICD-10-CM

## 2011-02-09 MED ORDER — HYDROCODONE-HOMATROPINE 5-1.5 MG/5ML PO SYRP: 5.0000 mL | ORAL_SOLUTION | Freq: Every evening | ORAL | Status: AC | PRN

## 2011-02-09 NOTE — Patient Instructions (Signed)
Call if getting worse or not getting better.

## 2011-02-09 NOTE — Progress Notes (Signed)
  Subjective:    Patient ID: GEROGE Frey, male    DOB: Oct 23, 1960, 50 y.o.   MRN: [id number]  HPI Cough and draingae for 2 days. + ST. + nasal congestion. No fever.  Feels achey.  + sick contacts.  Taking OTC cold meds. Taking mostly daytime and nighttime cold medicines. NO SOB. Has been wheezing when he lays down. Not using his veramyst.    Review of Systems     Objective:   Physical Exam  Constitutional: He is oriented to person, place, and time. He appears well-developed and well-nourished.  HENT:  Head: Normocephalic and atraumatic.  Right Ear: External ear normal.  Left Ear: External ear normal.  Nose: Nose normal.  Mouth/Throat: Oropharynx is clear and moist.       TMs and canals are clear.   Eyes: Conjunctivae and EOM are normal. Pupils are equal, round, and reactive to light.  Neck: Neck supple. No thyromegaly present.  Cardiovascular: Normal rate and normal heart sounds.   Pulmonary/Chest: Effort normal and breath sounds normal.  Lymphadenopathy:    He has no cervical adenopathy.  Neurological: He is alert and oriented to person, place, and time.  Skin: Skin is warm and dry.  Psychiatric: He has a normal mood and affect.          Assessment & Plan:  URI - Likely viral. Call if gets worse, gets a fever, or not better in one week. Gave h.o. On cold sxs.  BP elevated but likley from cold meds.

## 2011-02-19 ENCOUNTER — Ambulatory Visit
Admission: RE | Admit: 2011-02-19 | Discharge: 2011-02-19 | Disposition: A | Discharge status: Home / Self Care | Payer: 59 | Source: Ambulatory Visit | Attending: Family Medicine | Admitting: Family Medicine

## 2011-02-19 ENCOUNTER — Other Ambulatory Visit: Payer: Self-pay | Admitting: Family Medicine

## 2011-02-19 ENCOUNTER — Inpatient Hospital Stay (INDEPENDENT_AMBULATORY_CARE_PROVIDER_SITE_OTHER)
Admission: RE | Admit: 2011-02-19 | Discharge: 2011-02-19 | Disposition: A | Discharge status: Home / Self Care | Payer: Self-pay | Source: Ambulatory Visit | Attending: Family Medicine | Admitting: Family Medicine

## 2011-02-19 ENCOUNTER — Encounter: Payer: Self-pay | Admitting: Family Medicine

## 2011-02-19 DIAGNOSIS — M79609 Pain in unspecified limb: Secondary | ICD-10-CM

## 2011-02-20 ENCOUNTER — Other Ambulatory Visit: Payer: Self-pay | Admitting: Family Medicine

## 2011-03-19 ENCOUNTER — Encounter: Payer: Self-pay | Admitting: Emergency Medicine

## 2011-03-19 ENCOUNTER — Inpatient Hospital Stay (INDEPENDENT_AMBULATORY_CARE_PROVIDER_SITE_OTHER)
Admission: RE | Admit: 2011-03-19 | Discharge: 2011-03-19 | Disposition: A | Discharge status: Home / Self Care | Payer: 59 | Source: Ambulatory Visit | Attending: Emergency Medicine | Admitting: Emergency Medicine

## 2011-03-19 DIAGNOSIS — J069 Acute upper respiratory infection, unspecified: Secondary | ICD-10-CM

## 2011-03-24 ENCOUNTER — Encounter: Payer: Self-pay | Admitting: Family Medicine

## 2011-03-30 ENCOUNTER — Telehealth: Payer: Self-pay | Admitting: Family Medicine

## 2011-03-30 MED ORDER — HYDROCOD POLST-CHLORPHEN POLST 10-8 MG/5ML PO LQCR: 5.0000 mL | Freq: Two times a day (BID) | ORAL | Status: DC | PRN

## 2011-03-30 NOTE — Telephone Encounter (Signed)
Tussionex was sent and pt notified that it was being sent to his pharmacy.  Pt wanted Dr. Linford Arnold to be informed that he went to the ENT doctor and they are going to do surgery on his sinuses. Plan:  Routed to Dr. Linford Arnold. Jarvis Newcomer, LPN [facility]

## 2011-03-30 NOTE — Telephone Encounter (Signed)
Pt recently went to the UC here in our building.  He was given cheratussin for his cough.  [place] Spaniel it works but not that well.  Is [facility]  Cough is keeping him up at night.  Looks like we have given him tussionex in the past for cough from our office. Plan:  Routed encounter to the provider for review. Jarvis Newcomer, LPN [facility]

## 2011-03-30 NOTE — Telephone Encounter (Signed)
Routed to Dr. Marlyne Beards, LPN [facility]

## 2011-03-30 NOTE — Telephone Encounter (Signed)
OK for tussionex if that worked better for him.

## 2011-04-05 ENCOUNTER — Emergency Department
Admission: EM | Admit: 2011-04-05 | Discharge: 2011-04-05 | Disposition: A | Discharge status: Home / Self Care | Payer: 59 | Source: Home / Self Care | Attending: Emergency Medicine | Admitting: Emergency Medicine

## 2011-04-05 DIAGNOSIS — J0100 Acute maxillary sinusitis, unspecified: Secondary | ICD-10-CM

## 2011-04-05 MED ORDER — AMOXICILLIN-POT CLAVULANATE 875-125 MG PO TABS: 1.0000 | ORAL_TABLET | Freq: Two times a day (BID) | ORAL | Status: AC

## 2011-04-05 NOTE — ED Provider Notes (Signed)
History     CSN: [id number] Arrival date & time: 04/05/2011  5:54 PM   First MD Initiated Contact with Patient 04/05/11 1809      Chief Complaint  Patient presents with  . Sinusitis    (Consider location/radiation/quality/duration/timing/severity/associated sxs/prior treatment) HPI Another onset of cold symptoms for 2 days.  He has a history of multiple sinus infections and claims that he has sinus surgery the week of Thanksgiving. No sore throat + cough No pleuritic pain No wheezing No nasal congestion + post-nasal drainage + sinus pain/pressure No chest congestion No itchy/red eyes No earache No hemoptysis No SOB No chills/sweats No fever No nausea No vomiting No abdominal pain No diarrhea No skin rashes No fatigue No myalgias + headache    Past Medical History  Diagnosis Date  . ALS (amyotrophic lateral sclerosis)   . COPD (chronic obstructive pulmonary disease)     Past Surgical History  Procedure Date  . Appendectomy   . Tonsillectomy and adenoidectomy     Family History  Problem Relation Age of Onset  . Alcohol abuse Other   . Depression Other   . Hypertension Other   . Hyperlipidemia Other   . Stroke Other     History  Substance Use Topics  . Smoking status: Former Smoker    Quit date: 08/13/2004  . Smokeless tobacco: Not on file  . Alcohol Use: No      Review of Systems  Allergies  Review of patient's allergies indicates no known allergies.  Home Medications   Current Outpatient Rx  Name Route Sig Dispense Refill  . AMOXICILLIN-POT CLAVULANATE 875-125 MG PO TABS Oral Take 1 tablet by mouth 2 (two) times daily. 28 tablet 0  . HYDROCOD POLST-CHLORPHEN POLST 10-8 MG/5ML PO LQCR Oral Take 5 mLs by mouth every 12 (twelve) hours as needed. 140 mL 0  . ETANERCEPT 50 MG/ML [place] SOLN Subcutaneous Inject 50 mg into the skin as directed.      Marland Kitchen FLUTICASONE FUROATE 27.5 MCG/SPRAY NA SUSP Nasal 2 sprays by Nasal route daily. 10 g 0  . FOLIC  ACID 1 MG PO TABS Oral Take 1 mg by mouth daily.      Marland Kitchen HYDROCODONE-ACETAMINOPHEN 2.5-500 MG PO TABS Oral Take 1 tablet by mouth as directed.      . IMIPRAMINE HCL 50 MG PO TABS  TAKE 4 TABLETS AT BEDTIME 120 tablet 1  . METHOTREXATE (ANTI-RHEUMATIC) 2.5 MG PO TABS Oral Take 7.5 mg by mouth. Injection directed to draw up .8 mgs by RA physician     . TRAMADOL HCL 50 MG PO TABS Oral Take 50 mg by mouth.        BP 131/81  Pulse 84  Temp(Src) 98.1 F (36.7 C) (Oral)  Resp 20  Ht 6' (1.829 m)  Wt 238 lb 8 oz (108.183 kg)  BMI 32.35 kg/m2  SpO2 96%  Physical Exam  Nursing note and vitals reviewed. Constitutional: He is oriented to person, place, and time. He appears well-developed and well-nourished.  HENT:  Head: Normocephalic and atraumatic.  Right Ear: Tympanic membrane, external ear and ear canal normal.  Left Ear: Tympanic membrane, external ear and ear canal normal.  Nose: Mucosal edema and rhinorrhea present. Right sinus exhibits maxillary sinus tenderness. Left sinus exhibits maxillary sinus tenderness.  Mouth/Throat: Posterior oropharyngeal erythema present. No oropharyngeal exudate or posterior oropharyngeal edema.  Neck: Neck supple.  Cardiovascular: Regular rhythm and normal heart sounds.   Pulmonary/Chest: Effort normal and breath sounds normal.  No respiratory distress.  Lymphadenopathy:    He has cervical adenopathy.       Right cervical: Superficial cervical adenopathy present.       Left cervical: Superficial cervical adenopathy present.  Neurological: He is alert and oriented to person, place, and time.  Skin: Skin is warm and dry.  Psychiatric: He has a normal mood and affect. His speech is normal.    ED Course  Procedures (including critical care time)  Labs Reviewed - No data to display No results found.   No diagnosis found.    MDM          Lily Kocher, MD 04/05/11 [phone]

## 2011-04-10 ENCOUNTER — Telehealth: Payer: Self-pay | Admitting: *Deleted

## 2011-04-10 MED ORDER — HYDROCOD POLST-CHLORPHEN POLST 10-8 MG/5ML PO LQCR: 5.0000 mL | Freq: Two times a day (BID) | ORAL | Status: DC | PRN

## 2011-04-10 NOTE — Telephone Encounter (Addendum)
Pt called and states he is still coughing.Pt wants a refill on the last cough med rx. He was last seen back in sept.

## 2011-04-21 ENCOUNTER — Encounter: Payer: Self-pay | Admitting: Emergency Medicine

## 2011-04-21 ENCOUNTER — Emergency Department
Admission: EM | Admit: 2011-04-21 | Discharge: 2011-04-21 | Disposition: A | Discharge status: Home / Self Care | Payer: 59 | Source: Home / Self Care | Attending: Family Medicine | Admitting: Family Medicine

## 2011-04-21 ENCOUNTER — Other Ambulatory Visit: Payer: Self-pay | Admitting: Family Medicine

## 2011-04-21 DIAGNOSIS — J0100 Acute maxillary sinusitis, unspecified: Secondary | ICD-10-CM

## 2011-04-21 DIAGNOSIS — J069 Acute upper respiratory infection, unspecified: Secondary | ICD-10-CM

## 2011-04-21 LAB — POCT CBC W AUTO DIFF (K'VILLE URGENT CARE)

## 2011-04-21 LAB — POCT FASTING CBG KUC MANUAL ENTRY

## 2011-04-21 MED ORDER — LEVOFLOXACIN 500 MG PO TABS: 500.0000 mg | ORAL_TABLET | Freq: Every day | ORAL | Status: AC

## 2011-04-21 MED ORDER — HYDROCOD POLST-CHLORPHEN POLST 10-8 MG/5ML PO LQCR: 5.0000 mL | Freq: Every evening | ORAL | Status: DC | PRN

## 2011-04-21 NOTE — ED Notes (Addendum)
Recurrent sinus infections; reports scheduled for sinus surgery with Dr.Byer 04-19-11 but cancelled due to current infx. Hx depressed immune system. Just completed course of Amoxicillin 2 days ago.

## 2011-04-24 NOTE — ED Provider Notes (Signed)
History     CSN: [id number] Arrival date & time: 04/21/2011  3:19 PM   First MD Initiated Contact with Patient 04/21/11 1606      Chief Complaint  Patient presents with  . Sinusitis     HPI Comments: Patient was treated two weeks ago for a URI with amoxicillin and improved.  Yesterday he developed myalgias, sweats, and worsening sinus congestion.  Also has persistent cough at night.  He finished amoxicillin two days ago.  Patient is a 50 y.o. male presenting with sinusitis. The history is provided by the patient.  Sinusitis  This is a recurrent problem. The current episode started yesterday. The problem has been gradually worsening. There has been no fever. Associated symptoms include chills, sweats, congestion, sinus pressure and cough. Pertinent negatives include no ear pain, no sore throat and no shortness of breath.    Past Medical History  Diagnosis Date  . ALS (amyotrophic lateral sclerosis)   . COPD (chronic obstructive pulmonary disease)     Past Surgical History  Procedure Date  . Appendectomy   . Tonsillectomy and adenoidectomy     Family History  Problem Relation Age of Onset  . Alcohol abuse Other   . Depression Other   . Hypertension Other   . Hyperlipidemia Other   . Stroke Other     History  Substance Use Topics  . Smoking status: Former Smoker    Quit date: 08/13/2004  . Smokeless tobacco: Not on file  . Alcohol Use: Not on file      Review of Systems  Constitutional: Positive for chills and fatigue. Negative for fever, activity change and appetite change.  HENT: Positive for congestion, rhinorrhea, postnasal drip and sinus pressure. Negative for ear pain, sore throat and ear discharge.   Eyes: Negative.   Respiratory: Positive for cough and wheezing. Negative for shortness of breath.   Cardiovascular: Negative.   Gastrointestinal: Negative.   Genitourinary: Negative.   Musculoskeletal: Negative.   Skin: Negative.   Neurological: Positive  for headaches.    Allergies  Zithromax  Home Medications   Current Outpatient Rx  Name Route Sig Dispense Refill  . ETANERCEPT 50 MG/ML [place] SOLN Subcutaneous Inject 50 mg into the skin as directed.      . METHOTREXATE (ANTI-RHEUMATIC) 2.5 MG PO TABS Oral Take 7.5 mg by mouth. Injection directed to draw up .8 mgs by RA physician     . TRAMADOL HCL 50 MG PO TABS Oral Take 50 mg by mouth.      Marland Kitchen HYDROCOD POLST-CHLORPHEN POLST 10-8 MG/5ML PO LQCR Oral Take 5 mLs by mouth at bedtime as needed. 115 mL 0  . IMIPRAMINE HCL 50 MG PO TABS  TAKE 4 TABLETS AT BEDTIME 120 tablet 1  . LEVOFLOXACIN 500 MG PO TABS Oral Take 1 tablet (500 mg total) by mouth daily. 7 tablet 0    BP 133/91  Pulse 116  Temp(Src) 98 F (36.7 C) (Oral)  Resp 18  Ht 6' (1.829 m)  Wt 226 lb (102.513 kg)  BMI 30.65 kg/m2  SpO2 100%  Physical Exam  Nursing note and vitals reviewed. Constitutional: He is oriented to person, place, and time. He appears well-developed and well-nourished. No distress.  HENT:  Head: Normocephalic and atraumatic.  Right Ear: Tympanic membrane, external ear and ear canal normal.  Left Ear: Tympanic membrane, external ear and ear canal normal.  Nose: Mucosal edema and rhinorrhea present. Right sinus exhibits maxillary sinus tenderness. Left sinus exhibits maxillary sinus  tenderness.  Mouth/Throat: Oropharynx is clear and moist. No oral lesions. No oropharyngeal exudate.  Eyes: Conjunctivae and EOM are normal. Pupils are equal, round, and reactive to light. Right eye exhibits no discharge. Left eye exhibits no discharge. No scleral icterus.  Neck: Neck supple.  Cardiovascular: Normal rate, regular rhythm and normal heart sounds.   Pulmonary/Chest: Effort normal and breath sounds normal. He has no wheezes. He has no rales. He exhibits no tenderness.  Abdominal: Soft. Bowel sounds are normal. There is no tenderness.  Musculoskeletal: He exhibits no edema.  Lymphadenopathy:    He has no  cervical adenopathy.  Neurological: He is alert and oriented to person, place, and time.  Skin: Skin is warm and dry.    ED Course  Procedures none      1. Acute maxillary sinusitis   2. Acute upper respiratory infections of unspecified site       MDM  Begin Levaquin.  Begin expectorant, topical decongestant, saline nasal spray and/or saline irrigation, and cough suppressant at bedtime.  Avoid antihistamines for now. Increase fluid intake, rest. Followup with PCP if not improving 7 to 10 days.         Donna Christen, MD 04/24/11 [phone]

## 2011-05-01 NOTE — Telephone Encounter (Signed)
  Phone Note Outgoing Call   Call placed by: Clemens Catholic LPN,  September 14, 2010 10:11 AM Call placed to: Patient Summary of Call: the pt came into the clinic to be seen last night for sinusitis. he states that he saw the ENT and he was feeling better at that time. he was told to call them back when he was feeling sick again and they would see him at that time. he states that he called them multiple times with no response. i advised him that per dr Orson Aloe he needs to f/u with the ENT tomorrow. We did not see the pt for a visit  and advised him that I would call the ENT for him. I called the ENT this morning, left a message for Dr Emmit Pomfret nurse Waynetta Sandy. She called back and left a message that she had contacted the pt to sch him for a sinus CT.  Initial call taken by: Clemens Catholic LPN,  September 14, 2010 10:22 AM

## 2011-05-01 NOTE — Progress Notes (Signed)
Summary: foot/ankle swollen/TM(rm5()   Vital Signs:  Patient Profile:   50 Years Old Male CC:      sweeling in ankle/foot Height:     72 inches (178.44 cm) Weight:      238 pounds O2 Sat:      97 % O2 treatment:    Room Air Temp:     97.8 degrees F oral Pulse rate:   72 / minute Resp:     20 per minute BP sitting:   127 / 78  (left arm) Cuff size:   large  Vitals Entered By: Linton Flemings RN (February 19, 2011 12:03 PM)                  Updated Prior Medication List: IMIPRAMINE HCL 50 MG TABS (IMIPRAMINE HCL) take four by mouth, at bedtime METHOTREXATE 2.5 MG TABS (METHOTREXATE SODIUM) injection directed to draw up .8 mgs by RA physician ENBREL SURECLICK 50 MG/ML SOLN (ETANERCEPT) As directed FOLIC ACID 1 MG TABS (FOLIC ACID)  TRAMADOL HCL 50 MG TABS (TRAMADOL HCL)  HYDROCODONE-HOMATROPINE 5-1.5 MG/5ML SYRP (HYDROCODONE-HOMATROPINE) 5cc q8 hrs as needed for cough  Current Allergies (reviewed today): ! * Z-PACKHistory of Present Illness Chief Complaint: sweeling in ankle/foot History of Present Illness:  Subjective:  Patient complains of 3 day history of pain/swelling in the dorsum of his right foot/ankle.  He recalls no trauma to the area or significant change in activities.  No fever.  She states that he acquired sunburn on the dorsa of both feet the next day, but swelling in his right foot started before sun exposure.  No fevers, chills, and sweats  No history of gout.  REVIEW OF SYSTEMS Constitutional Symptoms      Denies fever, chills, night sweats, weight loss, weight gain, and fatigue.  Eyes       Denies change in vision, eye pain, eye discharge, glasses, contact lenses, and eye surgery. Ear/Nose/Throat/Mouth       Denies hearing loss/aids, change in hearing, ear pain, ear discharge, dizziness, frequent runny nose, frequent nose bleeds, sinus problems, sore throat, hoarseness, and tooth pain or bleeding.  Respiratory       Denies dry cough, productive cough,  wheezing, shortness of breath, asthma, bronchitis, and emphysema/COPD.  Cardiovascular       Denies murmurs, chest pain, and tires easily with exhertion.    Gastrointestinal       Denies stomach pain, nausea/vomiting, diarrhea, constipation, blood in bowel movements, and indigestion. Genitourniary       Denies painful urination, kidney stones, and loss of urinary control. Neurological       Denies paralysis, seizures, and fainting/blackouts. Musculoskeletal       Complains of joint stiffness, decreased range of motion, and redness.      Denies muscle pain, joint pain, swelling, muscle weakness, and gout.  Skin       Denies bruising, unusual mles/lumps or sores, and hair/skin or nail changes.  Psych       Denies mood changes, temper/anger issues, anxiety/stress, speech problems, depression, and sleep problems. Other Comments: swelling to right foot/anklex 3 days no known injury   Past History:  Past Medical History: Reviewed history from 07/29/2010 and no changes required. Spondilitis ALS COPD  Past Surgical History: Reviewed history from 07/27/2010 and no changes required. Appendectomy Tonsillectomy + adenoids  Family History: Reviewed history from 06/14/2006 and no changes required. alcoholism-grandparents  Depression, DM, HTN, hi chol-parent, stroke-GF  Social History: Reviewed history from 03/06/2006 and no changes  required. [employer] for the [employer], HS degree.  Married to [employer].  Have one child.  Quit smoking 08/13/04.  No EtOH, no drugs, 8 caffeinated drinks/day, no reg exercise.   Objective:  No acute distress  Lungs:  Clear to auscultation.  Breath sounds are equal.  Heart:  Regular rate and rhythm without murmurs, rubs, or gallops.  Right foot:  Mild swelling/tenderness dorsum.  No warmth.  Distal neurovascular intact  Skin:  Erythema on mid dorsa of both feet.  Left foot nontender, however X-ray right foot:  IMPRESSION: Soft tissue  swelling without acute osseous or joint abnormality. Assessment New Problems: FOOT PAIN, RIGHT (ICD-729.5)  SUSPECT GOUT  Plan New Medications/Changes: PREDNISONE 10 MG TABS (PREDNISONE) 2 PO BID for 2 days, then 1 BID for 2 days, then 1 daily for 2 days.  Take PC  #14 x 0, 02/19/2011, Donna Christen MD  New Orders: T-DG Foot Complete*R* [phone] Services provided After hours-Weekends-Holidays [99051] Est. Patient Level III [99213] Planning Comments:   Begin tapering course of prednisone.  Elevate foot. Follow-up with PCP if not improving.   The patient and/or caregiver has been counseled thoroughly with regard to medications prescribed including dosage, schedule, interactions, rationale for use, and possible side effects and they verbalize understanding.  Diagnoses and expected course of recovery discussed and will return if not improved as expected or if the condition worsens. Patient and/or caregiver verbalized understanding.  Prescriptions: PREDNISONE 10 MG TABS (PREDNISONE) 2 PO BID for 2 days, then 1 BID for 2 days, then 1 daily for 2 days.  Take PC  #14 x 0   Entered and Authorized by:   Donna Christen MD   Signed by:   Donna Christen MD on 02/19/2011   Method used:   Print then Give to Patient   RxID:   [id number]   Orders Added: 1)  T-DG Foot Complete*R* [73630] 2)  Services provided After hours-Weekends-Holidays [99051] 3)  Est. Patient Level III [95621]

## 2011-05-01 NOTE — Progress Notes (Signed)
Summary: HEAD COLD/WSE rm 4   Vital Signs:  Patient Profile:   50 Years Old Male CC:      Cold & URI symptoms Height:     70.25 inches (178.44 cm) Weight:      231 pounds O2 Sat:      97 % O2 treatment:    Room Air Temp:     98.2 degrees F oral Pulse rate:   114 / minute Resp:     16 per minute BP sitting:   130 / 85  (left arm) Cuff size:   regular  Vitals Entered By: Clemens Catholic LPN (September 01, 6293 4:08 PM)                  Updated Prior Medication List: IMIPRAMINE HCL 50 MG TABS (IMIPRAMINE HCL) take four by mouth, at bedtime METHOTREXATE 2.5 MG TABS (METHOTREXATE SODIUM) injection directed to draw up .8 mgs by RA physician ENBREL SURECLICK 50 MG/ML SOLN (ETANERCEPT) As directed HYDROCODONE-ACETAMINOPHEN 2.5-500 MG TABS (HYDROCODONE-ACETAMINOPHEN)  FOLIC ACID 1 MG TABS (FOLIC ACID)  TRAMADOL HCL 50 MG TABS (TRAMADOL HCL)   Current Allergies (reviewed today): ! * Z-PACKHistory of Present Illness History from: patient Chief Complaint: Cold & URI symptoms History of Present Illness: 50 Years Old Male complains of onset of cold symptoms for a week.  Rhiley has been using lots of OTC meds which is helping a little bit.  He has been seen here recently and I sent him to ENT.  He has an appt scheduled in 4 days. No sore throat + cough No pleuritic pain No wheezing + nasal congestion + post-nasal drainage + sinus pain/pressure No chest congestion No itchy/red eyes No earache No hemoptysis No SOB No chills/sweats No fever No nausea No vomiting No abdominal pain No diarrhea No skin rashes No fatigue No myalgias No headache   REVIEW OF SYSTEMS Constitutional Symptoms       Complains of fever, chills, and night sweats.     Denies weight loss, weight gain, and fatigue.  Eyes       Complains of glasses.      Denies change in vision, eye pain, eye discharge, contact lenses, and eye surgery. Ear/Nose/Throat/Mouth       Complains of ear pain, frequent runny  nose, sinus problems, sore throat, and hoarseness.      Denies hearing loss/aids, change in hearing, ear discharge, dizziness, frequent nose bleeds, and tooth pain or bleeding.  Respiratory       Complains of productive cough and wheezing.      Denies dry cough, shortness of breath, asthma, bronchitis, and emphysema/COPD.  Cardiovascular       Denies murmurs, chest pain, and tires easily with exhertion.    Gastrointestinal       Denies stomach pain, nausea/vomiting, diarrhea, constipation, blood in bowel movements, and indigestion. Genitourniary       Denies painful urination, kidney stones, and loss of urinary control. Neurological       Complains of headaches.      Denies paralysis, seizures, and fainting/blackouts. Musculoskeletal       Denies muscle pain, joint pain, joint stiffness, decreased range of motion, redness, swelling, muscle weakness, and gout.  Skin       Denies bruising, unusual mles/lumps or sores, and hair/skin or nail changes.  Psych       Denies mood changes, temper/anger issues, anxiety/stress, speech problems, depression, and sleep problems. Other Comments: pt c/o night time cough, stuffy nose,  sneezing, RT ear ache, and ?fever(he has had sweats) x 4days. he has used an OTC nasal spray and Sudafed. with no relief. he has not tried any allergy meds.    Past History:  Past Medical History: Reviewed history from 07/29/2010 and no changes required. Spondilitis ALS COPD  Past Surgical History: Reviewed history from 07/27/2010 and no changes required. Appendectomy Tonsillectomy + adenoids  Family History: Reviewed history from 06/14/2006 and no changes required. alcoholism-grandparents  Depression, DM, HTN, hi chol-parent, stroke-GF  Social History: Reviewed history from 03/06/2006 and no changes required. [employer] for the [place] [employer] [place], HS degree.  Married to [employer].  Have one child.  Quit smoking 08/13/04.  No EtOH, no drugs, 8  caffeinated drinks/day, no reg exercise. Physical Exam General appearance: well developed, well nourished, no acute distress Head: maxillary sinus tenderness Nasal: mucosa pink, nonedematous, no septal deviation, turbinates normal Oral/Pharynx: tongue normal, posterior pharynx without erythema or exudate Neck: neck supple,  trachea midline, no masses Chest/Lungs: no rales, wheezes, or rhonchi bilateral, breath sounds equal without effort Heart: regular rate and  rhythm, no murmur MSE: oriented to time, place, and person Assessment New Problems: UPPER RESPIRATORY INFECTION, ACUTE (ICD-465.9)   Plan New Medications/Changes: Sandria Senter ER 10-8 MG/5ML LQCR (HYDROCOD POLST-CHLORPHEN POLST) 5cc two times a day as needed for cough  #2oz x 0, 09/02/2010, Hoyt Koch MD  New Orders: Est. Patient Level IV [96045] Solumedrol up to 125mg  [J2930] Pulse Oximetry (single measurment) [94760] Admin of Therapeutic Inj  intramuscular or subcutaneous [96372] Planning Comments:   1) No antibiotic given since I'd like ENT to see him when still sick.  Will likely need scope +/-  CT per their recs.  I expect polyps w/ chronic sinusitis. 2)  Use nasal saline solution (over the counter) at least 3 times a day. 3)  Use over the counter decongestants like Zyrtec-D every 12 hours as needed to help with congestion. 4)  Can take tylenol every 6 hours or motrin every 8 hours for pain or fever. 5)  Follow up with your primary doctor  if no improvement in 5-7 days, sooner if increasing pain, fever, or new symptoms.    The patient and/or caregiver has been counseled thoroughly with regard to medications prescribed including dosage, schedule, interactions, rationale for use, and possible side effects and they verbalize understanding.  Diagnoses and expected course of recovery discussed and will return if not improved as expected or if the condition worsens. Patient and/or caregiver verbalized  understanding.  Prescriptions: Sandria Senter ER 10-8 MG/5ML LQCR (HYDROCOD POLST-CHLORPHEN POLST) 5cc two times a day as needed for cough  #2oz x 0   Entered and Authorized by:   Hoyt Koch MD   Signed by:   Hoyt Koch MD on 09/02/2010   Method used:   Print then Give to Patient   RxID:   [id number]   Medication Administration  Injection # 1:    Medication: Solumedrol up to 125mg     Diagnosis: UPPER RESPIRATORY INFECTION, ACUTE (ICD-465.9)    Route: IM    Site: RUOQ gluteus    Exp Date: 02/26/2013    Lot #: [id number]    Mfr: Pharmacia    Patient tolerated injection without complications    Given by: Clemens Catholic LPN (September 02, 1306 4:37 PM)  Orders Added: 1)  Est. Patient Level IV [65784] 2)  Solumedrol up to 125mg  [J2930] 3)  Pulse Oximetry (single measurment) [94760] 4)  Admin of Therapeutic Inj  intramuscular or subcutaneous [16109]

## 2011-05-01 NOTE — Progress Notes (Signed)
Summary: Refill medication  Prescriptions: CHERATUSSIN AC 100-10 MG/5ML SYRP (GUAIFENESIN-CODEINE) 5cc 10cc by mouth hs as needed cough  #2 oz x 0   Entered and Authorized by:   Donna Christen MD   Signed by:   Donna Christen MD on 03/24/2011   Method used:   Print then Give to Patient   RxID:   [id number] FLONASE 50 MCG/ACT SUSP (FLUTICASONE PROPIONATE) 2 sprays in each nostril once daily.  #1 x 1   Entered and Authorized by:   Donna Christen MD   Signed by:   Donna Christen MD on 03/24/2011   Method used:   Print then Give to Patient   RxID:   [id number]  Requests refill of Flonase and Cough medicine. Donna Christen MD  March 24, 2011 6:09 PM

## 2011-05-01 NOTE — Progress Notes (Signed)
Summary: sinus/TM(RM5)   Vital Signs:  Patient Profile:   50 Years Old Male CC:      SINUS INFECTION Height:     72 inches (178.44 cm) Weight:      228.50 pounds O2 Sat:      95 % O2 treatment:    Room Air Temp:     97.9 degrees F oral Pulse rate:   91 / minute Resp:     20 per minute BP sitting:   125 / 81  (left arm) Cuff size:   large  Vitals Entered By: Linton Flemings RN (March 19, 2011 11:49 AM)                  Updated Prior Medication List: IMIPRAMINE HCL 50 MG TABS (IMIPRAMINE HCL) take four by mouth, at bedtime METHOTREXATE 2.5 MG TABS (METHOTREXATE SODIUM) injection directed to draw up .8 mgs by RA physician ENBREL SURECLICK 50 MG/ML SOLN (ETANERCEPT) As directed FOLIC ACID 1 MG TABS (FOLIC ACID)  TRAMADOL HCL 50 MG TABS (TRAMADOL HCL)   Current Allergies: ! * Z-PACKHistory of Present Illness Chief Complaint: SINUS INFECTION History of Present Illness: 50 Years Old Male complains of onset of cold symptoms for a few days. He has frequent sinus infections (he is on MTX and Embrel and has a poor immune system). A CT last year shows sinusitis. No sore throat + cough No pleuritic pain No wheezing +nasal congestion + post-nasal drainage +sinus pain/pressure No chest congestion No itchy/red eyes +earache No hemoptysis No SOB No chills/sweats No fever No nausea No vomiting No abdominal pain No diarrhea No skin rashes No fatigue No myalgias No headache   REVIEW OF SYSTEMS Constitutional Symptoms       Complains of fever.     Denies chills, night sweats, weight loss, weight gain, and fatigue.  Eyes       Denies change in vision, eye pain, eye discharge, glasses, contact lenses, and eye surgery. Ear/Nose/Throat/Mouth       Complains of ear pain and sore throat.      Denies hearing loss/aids, change in hearing, ear discharge, dizziness, frequent runny nose, frequent nose bleeds, sinus problems, hoarseness, and tooth pain or bleeding.  Respiratory     Complains of dry cough and wheezing.      Denies productive cough, shortness of breath, asthma, bronchitis, and emphysema/COPD.  Cardiovascular       Denies murmurs, chest pain, and tires easily with exhertion.    Gastrointestinal       Denies stomach pain, nausea/vomiting, diarrhea, constipation, blood in bowel movements, and indigestion. Genitourniary       Denies painful urination, kidney stones, and loss of urinary control. Neurological       Complains of headaches.      Denies paralysis, seizures, and fainting/blackouts. Musculoskeletal       Denies muscle pain, joint pain, joint stiffness, decreased range of motion, redness, swelling, muscle weakness, and gout.  Skin       Denies bruising, unusual mles/lumps or sores, and hair/skin or nail changes.  Psych       Denies mood changes, temper/anger issues, anxiety/stress, speech problems, depression, and sleep problems. Other Comments: SYMTOMS STARTED ONE WEEK AGO   Past History:  Past Medical History: Reviewed history from 07/29/2010 and no changes required. Spondilitis ALS COPD  Past Surgical History: Reviewed history from 07/27/2010 and no changes required. Appendectomy Tonsillectomy + adenoids  Family History: Reviewed history from 06/14/2006 and no changes required. alcoholism-grandparents  Depression, DM, HTN, hi chol-parent, stroke-GF Physical Exam General appearance: well developed, well nourished, no acute distress Head: maxillary sinus tenderness Ears: normal, no lesions or deformities Nasal: clear discharge Oral/Pharynx: pharyngeal erythema without exudate, uvula midline without deviation Chest/Lungs: no rales, wheezes, or rhonchi bilateral, breath sounds equal without effort Heart: regular rate and  rhythm, no murmur MSE: oriented to time, place, and person Assessment New Problems: UPPER RESPIRATORY INFECTION, ACUTE (ICD-465.9)   Plan New Medications/Changes: HYDROCODONE-HOMATROPINE 5-1.5 MG/5ML  SYRP (HYDROCODONE-HOMATROPINE) 5cc q8 hrs as needed for cough  #4oz x 0, 03/19/2011, Hoyt Koch MD AUGMENTIN [phone] MG TABS (AMOXICILLIN-POT CLAVULANATE) 1 by mouth two times a day for 10 days  #20 x 0, 03/19/2011, Hoyt Koch MD  New Orders: Est. Patient Level III [phone] Solumedrol up to 125mg  [J2930] Planning Comments:   1)  Take the prescribed antibiotic as instructed.  Solumedrol given today.  I have advised that he return to an ENT and I gave him numbers to call.  Since he is having recurrent symptoms q2-3 months, I'd like the specialist to manage his symptoms (or his PCP can do it as well, but it may require scoping). 2)  Use nasal saline solution (over the counter) at least 3 times a day. 3)  Use over the counter decongestants like Zyrtec-D every 12 hours as needed to help with congestion. 4)  Can take tylenol every 6 hours or motrin every 8 hours for pain or fever. 5)  Follow up with your primary doctor  if no improvement in 5-7 days, sooner if increasing pain, fever, or new symptoms.    The patient and/or caregiver has been counseled thoroughly with regard to medications prescribed including dosage, schedule, interactions, rationale for use, and possible side effects and they verbalize understanding.  Diagnoses and expected course of recovery discussed and will return if not improved as expected or if the condition worsens. Patient and/or caregiver verbalized understanding.  Prescriptions: HYDROCODONE-HOMATROPINE 5-1.5 MG/5ML SYRP (HYDROCODONE-HOMATROPINE) 5cc q8 hrs as needed for cough  #4oz x 0   Entered and Authorized by:   Hoyt Koch MD   Signed by:   Hoyt Koch MD on 03/19/2011   Method used:   Print then Give to Patient   RxID:   [id number] AUGMENTIN 875-125 MG TABS (AMOXICILLIN-POT CLAVULANATE) 1 by mouth two times a day for 10 days  #20 x 0   Entered and Authorized by:   Hoyt Koch MD   Signed by:   Hoyt Koch MD on 03/19/2011    Method used:   Print then Give to Patient   RxID:   [id number]   Medication Administration  Injection # 1:    Medication: Solumedrol up to 125mg     Diagnosis: UPPER RESPIRATORY INFECTION, ACUTE (ICD-465.9)    Route: IM    Site: RUOQ gluteus    Exp Date: 10/28/2012    Lot #: [id number]    Mfr: Pharmacia    Patient tolerated injection without complications    Given by: Linton Flemings RN (March 19, 2011 12:11 PM)  Orders Added: 1)  Est. Patient Level III [47425] 2)  Solumedrol up to 125mg  [J2930]

## 2011-05-01 NOTE — Progress Notes (Signed)
Summary: HEAD COLD (room 5)   Vital Signs:  Patient Profile:   50 Years Old Male CC:      URI x 48 hours Height:     70.25 inches (178.44 cm) Weight:      229 pounds O2 Sat:      100 % O2 treatment:    Room Air Temp:     99.1 degrees F oral Pulse rate:   128 / minute Resp:     18 per minute BP sitting:   136 / 85  (left arm) Cuff size:   large  Vitals Entered By: Lavell Islam RN (November 25, 2010 5:36 PM)                  Current Allergies (reviewed today): ! * Z-PACKHistory of Present Illness History from: patient Chief Complaint: URI x 48 hours History of Present Illness: 50 Years Old Male complains of onset of cold symptoms for 2 days.  Robert Frey has been using Claritin which is helping a little bit.  He went to ENT, had a CT which showed mucosal thickening.   Didn't go back. No sore throat + cough No pleuritic pain No wheezing + nasal congestion + post-nasal drainage + sinus pain/pressure No chest congestion No itchy/red eyes No earache No hemoptysis No SOB + chills/sweats + fever No nausea No vomiting No abdominal pain No diarrhea No skin rashes No fatigue No myalgias + headache   REVIEW OF SYSTEMS Constitutional Symptoms       Complains of fever.     Denies chills, night sweats, weight loss, weight gain, and fatigue.  Eyes       Denies change in vision, eye pain, eye discharge, glasses, contact lenses, and eye surgery. Ear/Nose/Throat/Mouth       Complains of change in hearing, frequent runny nose, and sore throat.      Denies hearing loss/aids, ear pain, ear discharge, dizziness, frequent nose bleeds, sinus problems, hoarseness, and tooth pain or bleeding.  Respiratory       Complains of dry cough and wheezing.      Denies productive cough, shortness of breath, asthma, bronchitis, and emphysema/COPD.  Cardiovascular       Denies murmurs, chest pain, and tires easily with exhertion.    Gastrointestinal       Denies stomach pain, nausea/vomiting,  diarrhea, constipation, blood in bowel movements, and indigestion. Genitourniary       Denies painful urination, kidney stones, and loss of urinary control. Neurological       Complains of headaches.      Denies paralysis, seizures, and fainting/blackouts. Musculoskeletal       Complains of muscle pain and joint pain.      Denies joint stiffness, decreased range of motion, redness, swelling, muscle weakness, and gout.  Skin       Denies bruising, unusual mles/lumps or sores, and hair/skin or nail changes.  Psych       Denies mood changes, temper/anger issues, anxiety/stress, speech problems, depression, and sleep problems. Other Comments: URI S&S x 48 hours   Past History:  Family History: Last updated: 06/14/2006 alcoholism-grandparents  Depression, DM, HTN, hi chol-parent, stroke-GF  Social History: Last updated: 03/06/2006 [employer] for [employer] [place], HS degree.  Married to [employer].  Have one child.  Quit smoking 08/13/04.  No EtOH, no drugs, 8 caffeinated drinks/day, no reg exercise.  Past Medical History: Reviewed history from 07/29/2010 and no changes required. Spondilitis ALS COPD  Past  Surgical History: Reviewed history from 07/27/2010 and no changes required. Appendectomy Tonsillectomy + adenoids  Family History: Reviewed history from 06/14/2006 and no changes required. alcoholism-grandparents  Depression, DM, HTN, hi chol-parent, stroke-GF  Social History: Reviewed history from 03/06/2006 and no changes required. [employer] for [employer] [place] [employer] [place], HS degree.  Married to [employer].  Have one child.  Quit smoking 08/13/04.  No EtOH, no drugs, 8 caffeinated drinks/day, no reg exercise. Physical Exam General appearance: well developed, well nourished, no acute distress Head: maxillary sinus tenderness Ears: normal, no lesions or deformities Nasal: clear discharge Oral/Pharynx: clear PND, no erythema, no exudates, OP  patent Chest/Lungs: no rales, wheezes, or rhonchi bilateral, breath sounds equal without effort Heart: regular rate and  rhythm, no murmur MSE: oriented to time, place, and person Assessment New Problems: COUGH (ICD-786.2) UPPER RESPIRATORY INFECTION, ACUTE (ICD-465.9)   Patient Education: Patient and/or caregiver instructed in the following: rest, fluids.  Plan New Medications/Changes: HYDROCODONE-HOMATROPINE 5-1.5 MG/5ML SYRP (HYDROCODONE-HOMATROPINE) 5cc q8 hrs as needed for cough  #60cc x 0, 11/25/2010, Hoyt Koch MD  New Orders: Est. Patient Level IV [16109] Pulse Oximetry (single measurment) [94760] Solumedrol up to 125mg  [J2930] Planning Comments:   1)  I don't believe this is bacterial so no antibiotic given.  Will give Solumedrol since that worked well last time, hydration, and rest, and Rx cough syrup (only 2oz given since he's already getting pain meds from his rheumatologist and he can then take OTC meds like Delsym otherwise). 2)  Use nasal saline solution (over the counter) at least 3 times a day. 3)  Use over the counter decongestants like Zyrtec-D every 12 hours as needed to help with congestion. 4)  Can take tylenol every 6 hours or motrin every 8 hours for pain or fever. 5)  Follow up with your primary doctor  if no improvement in 5-7 days, sooner if increasing pain, fever, or new symptoms.   I would like him to follow up with ENT or PCP if he continues to get these symptoms since I'd prefer 1 doctor treating him consistantly.    The patient and/or caregiver has been counseled thoroughly with regard to medications prescribed including dosage, schedule, interactions, rationale for use, and possible side effects and they verbalize understanding.  Diagnoses and expected course of recovery discussed and will return if not improved as expected or if the condition worsens. Patient and/or caregiver verbalized understanding.  Prescriptions: HYDROCODONE-HOMATROPINE  5-1.5 MG/5ML SYRP (HYDROCODONE-HOMATROPINE) 5cc q8 hrs as needed for cough  #60cc x 0   Entered and Authorized by:   Hoyt Koch MD   Signed by:   Hoyt Koch MD on 11/25/2010   Method used:   Print then Give to Patient   RxID:   [id number]   Medication Administration  Injection # 1:    Medication: Solumedrol up to 125mg     Diagnosis: UPPER RESPIRATORY INFECTION, ACUTE (ICD-465.9)    Route: IM    Site: RUOQ gluteus    Exp Date: 05/2013    Lot #: [id number]    Mfr: pfizer    Comments: per order Dr.Lennon Richins    Patient tolerated injection without complications    Given by: Lavell Islam RN (November 25, 2010 5:50 PM)  Orders Added: 1)  Est. Patient Level IV [65784] 2)  Pulse Oximetry (single measurment) [94760] 3)  Solumedrol up to 125mg  [J2930]

## 2011-05-17 ENCOUNTER — Encounter: Payer: Self-pay | Admitting: *Deleted

## 2011-05-17 ENCOUNTER — Emergency Department
Admission: EM | Admit: 2011-05-17 | Discharge: 2011-05-17 | Disposition: A | Discharge status: Home / Self Care | Payer: 59 | Source: Home / Self Care

## 2011-05-17 DIAGNOSIS — L2089 Other atopic dermatitis: Secondary | ICD-10-CM

## 2011-05-17 DIAGNOSIS — L209 Atopic dermatitis, unspecified: Secondary | ICD-10-CM

## 2011-05-17 MED ORDER — METHYLPREDNISOLONE SODIUM SUCC 125 MG IJ SOLR
125.0000 mg | Freq: Once | INTRAMUSCULAR | Status: AC
  Administered 2011-05-17: 125 mg via INTRAMUSCULAR

## 2011-05-17 MED ORDER — PREDNISONE (PAK) 10 MG PO TABS: 10.0000 mg | ORAL_TABLET | Freq: Every day | ORAL | Status: AC

## 2011-05-17 NOTE — ED Notes (Signed)
Patient c/o itching that started yesterday after eating [place] food @  lunch. He has used benadryl without much relief. There is no rash but an old sore did develop a blister on his LFA. No new creams used, he did dye his beard the night before.

## 2011-05-17 NOTE — ED Provider Notes (Signed)
History     CSN: [id number] Arrival date & time: 05/17/2011  9:20 AM   None     Chief Complaint  Patient presents with  . Pruritis    (Consider location/radiation/quality/duration/timing/severity/associated sxs/prior treatment) HPI He started itching yesterday and has been very itchy for the last 24 hours. He states that he used a beard dye and is unsure whether or not this caused severe symptoms. He also ate a [place] and his symptoms started soon thereafter. He states that the itching is all over his body in he did try Benadryl last night which helped. No other new soaps, shampoos, detergents, travel, pets or any other triggers that he can think of. He does not notice any rash or other constitutional symptoms such as fever.  Past Medical History  Diagnosis Date  . ALS (amyotrophic lateral sclerosis)   . COPD (chronic obstructive pulmonary disease)     Past Surgical History  Procedure Date  . Appendectomy   . Tonsillectomy and adenoidectomy     Family History  Problem Relation Age of Onset  . Alcohol abuse Other   . Depression Other   . Hypertension Other   . Hyperlipidemia Other   . Stroke Other     History  Substance Use Topics  . Smoking status: Former Smoker    Quit date: 08/13/2004  . Smokeless tobacco: Not on file  . Alcohol Use: No      Review of Systems  Allergies  Zithromax  Home Medications   Current Outpatient Rx  Name Route Sig Dispense Refill  . HYDROCOD POLST-CHLORPHEN POLST 10-8 MG/5ML PO LQCR Oral Take 5 mLs by mouth at bedtime as needed. 115 mL 0  . ETANERCEPT 50 MG/ML [place] SOLN Subcutaneous Inject 50 mg into the skin as directed.      . IMIPRAMINE HCL 50 MG PO TABS  TAKE 4 TABLETS AT BEDTIME 120 tablet 1  . METHOTREXATE (ANTI-RHEUMATIC) 2.5 MG PO TABS Oral Take 7.5 mg by mouth. Injection directed to draw up .8 mgs by RA physician     . TRAMADOL HCL 50 MG PO TABS Oral Take 50 mg by mouth.        BP 124/76  Pulse 98   Temp(Src) 97.9 F (36.6 C) (Oral)  Resp 14  Ht 6' (1.829 m)  Wt 235 lb (106.595 kg)  BMI 31.87 kg/m2  SpO2 97%  Physical Exam  Nursing note and vitals reviewed. Constitutional: He is oriented to person, place, and time. He appears well-developed and well-nourished.  HENT:  Head: Normocephalic and atraumatic.  Eyes: No scleral icterus.  Neck: Neck supple.  Cardiovascular: Regular rhythm and normal heart sounds.   Pulmonary/Chest: Effort normal and breath sounds normal. No respiratory distress.  Neurological: He is alert and oriented to person, place, and time.  Skin: Skin is warm and dry. No rash noted.       No rashes are seen  Psychiatric: He has a normal mood and affect. His speech is normal.    ED Course  Procedures (including critical care time)  Labs Reviewed - No data to display No results found.   No diagnosis found.    MDM   This is likely an atopic dermatitis. We have given him a shot of Solu-Medrol in clinic. If that does not help completely I've also given him a prescription for prednisone pack. He can use Benadryl at night for the itching. And should avoid any further triggers.  Lily Kocher, MD 05/17/11 [phone]

## 2011-06-04 ENCOUNTER — Emergency Department
Admission: EM | Admit: 2011-06-04 | Discharge: 2011-06-04 | Disposition: A | Discharge status: Home / Self Care | Payer: 59 | Source: Home / Self Care | Attending: Family Medicine | Admitting: Family Medicine

## 2011-06-04 ENCOUNTER — Encounter: Payer: Self-pay | Admitting: Emergency Medicine

## 2011-06-04 DIAGNOSIS — J069 Acute upper respiratory infection, unspecified: Secondary | ICD-10-CM

## 2011-06-04 MED ORDER — CEFDINIR 300 MG PO CAPS: 300.0000 mg | ORAL_CAPSULE | Freq: Two times a day (BID) | ORAL | Status: AC

## 2011-06-04 MED ORDER — BENZONATATE 200 MG PO CAPS: 200.0000 mg | ORAL_CAPSULE | Freq: Every day | ORAL | Status: AC

## 2011-06-04 NOTE — ED Notes (Signed)
Cough, fever, sinus problems, headache x 3 days

## 2011-06-04 NOTE — ED Provider Notes (Signed)
History     CSN: [id number]  Arrival date & time 06/04/11  1156   First MD Initiated Contact with Patient 06/04/11 1347      Chief Complaint  Patient presents with  . Cough      HPI Comments: HPI : Flu symptoms started 2 days ago.  Fever with chills, sweats, myalgias, fatigue, headache.  No sore throat.  Symptoms became somewhat better yesterday. Has decreased appetite.  He had a flu shot 1.5 months ago.  Review of Systems: Positive for fatigue, mild nasal congestion, mild cough, myalgias. Negative for acute vision changes, stiff neck, focal weakness, syncope, seizures, respiratory distress, vomiting, diarrhea, GU symptoms, sore throat  Patient is a 51 y.o. male presenting with cough. The history is provided by the patient.  Cough This is a new problem. The current episode started 2 days ago. The problem occurs every few minutes. The problem has been gradually worsening. The cough is non-productive. The maximum temperature recorded prior to his arrival was 100 to 100.9 F. Associated symptoms include chills, sweats, headaches, rhinorrhea, myalgias and wheezing. Pertinent negatives include no chest pain, no ear congestion, no ear pain, no sore throat, no shortness of breath and no eye redness. He has tried cough syrup for the symptoms. The treatment provided no relief. He is not a smoker.    Past Medical History  Diagnosis Date  . ALS (amyotrophic lateral sclerosis)   . COPD (chronic obstructive pulmonary disease)     Past Surgical History  Procedure Date  . Appendectomy   . Tonsillectomy and adenoidectomy     Family History  Problem Relation Age of Onset  . Alcohol abuse Other   . Depression Other   . Hypertension Other   . Hyperlipidemia Other   . Stroke Other     History  Substance Use Topics  . Smoking status: Former Smoker    Quit date: 08/13/2004  . Smokeless tobacco: Not on file  . Alcohol Use: No      Review of Systems  Constitutional: Positive for  chills.  HENT: Positive for rhinorrhea. Negative for ear pain and sore throat.   Eyes: Negative for redness.  Respiratory: Positive for cough and wheezing. Negative for shortness of breath.   Cardiovascular: Negative for chest pain.  Musculoskeletal: Positive for myalgias.  Neurological: Positive for headaches.    Allergies  Zithromax  Home Medications   Current Outpatient Rx  Name Route Sig Dispense Refill  . BENZONATATE 200 MG PO CAPS Oral Take 1 capsule (200 mg total) by mouth at bedtime. 12 capsule 0  . CEFDINIR 300 MG PO CAPS Oral Take 1 capsule (300 mg total) by mouth 2 (two) times daily. (Rx void after 06/12/11) 20 capsule 0  . HYDROCOD POLST-CPM POLST ER 10-8 MG/5ML PO LQCR Oral Take 5 mLs by mouth at bedtime as needed. 115 mL 0  . ETANERCEPT 50 MG/ML Isabel SOLN Subcutaneous Inject 50 mg into the skin as directed.      . IMIPRAMINE HCL 50 MG PO TABS  TAKE 4 TABLETS AT BEDTIME 120 tablet 1  . METHOTREXATE (ANTI-RHEUMATIC) 2.5 MG PO TABS Oral Take 7.5 mg by mouth. Injection directed to draw up .8 mgs by RA physician     . TRAMADOL HCL 50 MG PO TABS Oral Take 50 mg by mouth.        BP 110/73  Pulse 124  Temp(Src) 98.5 F (36.9 C) (Oral)  Resp 16  Ht 6' (1.829 m)  Wt 224 lb (  101.606 kg)  BMI 30.38 kg/m2  SpO2 99%  Physical Exam Nursing notes and Vital Signs reviewed. Appearance:  Patient appears healthy, stated age, and in no acute distress Eyes:  Pupils are equal, round, and reactive to light and accomodation.  Extraocular movement is intact.  Conjunctivae are not inflamed  Ears:  Canals normal.  Tympanic membranes normal.  Nose:  Mildly congested turbinates.  No sinus tenderness.   Pharynx:  Normal Neck:  Supple.  Slightly tender shotty posterior nodes are palpated bilaterally  Lungs:  Clear to auscultation.  Breath sounds are equal.  Heart:  Regular rate and rhythm without murmurs, rubs, or gallops.  Abdomen:  Nontender without masses or hepatosplenomegaly.  Bowel  sounds are present.  No CVA or flank tenderness.  Extremities:  No edema.  No calf tenderness Skin:  No rash present.   ED Course  Procedures none   Labs Reviewed  POCT INFLUENZA A/B negative      1. Acute upper respiratory infections of unspecified site       MDM  There is no evidence of bacterial infection today.   Treat symptomatically for now  Take Mucinex D (guaifenesin with decongestant) twice daily for congestion.  Increase fluid intake, rest. May use Afrin nasal spray (or generic oxymetazoline) twice daily for about 5 days.  Also recommend using saline nasal spray several times daily and/or saline nasal irrigation. Stop all antihistamines for now, and other non-prescription cough/cold preparations. Begin Omnicef if not improving about 5 days or if persistent fever develops. Follow-up with family doctor if not improving 7 to 10 days.      Donna Christen, MD 06/04/11 [phone]

## 2011-06-08 LAB — POCT CBC W AUTO DIFF (K'VILLE URGENT CARE)

## 2011-06-21 ENCOUNTER — Other Ambulatory Visit: Payer: Self-pay | Admitting: Family Medicine

## 2011-07-03 ENCOUNTER — Ambulatory Visit (INDEPENDENT_AMBULATORY_CARE_PROVIDER_SITE_OTHER): Payer: 59 | Admitting: Physician Assistant

## 2011-07-03 ENCOUNTER — Encounter: Payer: Self-pay | Admitting: Physician Assistant

## 2011-07-03 VITALS — BP 137/84 | HR 107 | Temp 98.5°F | Wt 230.0 lb

## 2011-07-03 DIAGNOSIS — M25512 Pain in left shoulder: Secondary | ICD-10-CM

## 2011-07-03 DIAGNOSIS — M25519 Pain in unspecified shoulder: Secondary | ICD-10-CM

## 2011-07-03 NOTE — Patient Instructions (Signed)
Appt with Dr. Penni Bombard sports medicine for steroid injection on Wednesday. Continue to use ibuprofen up to 800mg  three times a day for pain. Alternating heat and ice.   Rotator Cuff Injury The rotator cuff is the collective set of muscles and tendons that make up the stabilizing unit of your shoulder. This unit holds in the ball of the humerus (upper arm bone) in the socket of the scapula (shoulder blade). Injuries to this stabilizing unit most commonly come from sports or activities that cause the arm to be moved repeatedly over the head. Examples of this include throwing, weight lifting, swimming, racquet sports, or an injury such as falling on your arm. Chronic (longstanding) irritation of this unit can cause inflammation (soreness), bursitis, and eventual damage to the tendons to the point of rupture (tear). An acute (sudden) injury of the rotator cuff can result in a partial or complete tear. You may need surgery with complete tears. Small or partial rotator cuff tears may be treated conservatively with temporary immobilization, exercises and rest. Physical therapy may be needed. HOME CARE INSTRUCTIONS   Apply ice to the injury for 15 to 20 minutes 3 to 4 times per day for the first 2 days. Put the ice in a plastic bag and place a towel between the bag of ice and your skin.   If you have a shoulder immobilizer (sling and straps), do not remove it for as long as directed by your caregiver or until you see a caregiver for a follow-up examination. If you need to remove it, move your arm as little as possible.   You may want to sleep on several pillows or in a recliner at night to lessen swelling and pain.   Only take over-the-counter or prescription medicines for pain, discomfort, or fever as directed by your caregiver.   Do simple hand squeezing exercises with a soft rubber ball to decrease hand swelling.  SEEK MEDICAL CARE IF:   Pain in your shoulder increases or new pain or numbness develops in  your arm, hand, or fingers.   Your hand or fingers are colder than your other hand.  SEEK IMMEDIATE MEDICAL CARE IF:   Your arm, hand, or fingers are numb or tingling.   Your arm, hand, or fingers are increasingly swollen and painful, or turn white or blue.  Document Released: 05/12/2000 Document Revised: 01/25/2011 Document Reviewed: 05/05/2008 [facility] Patient Information 2012 [place], [place].

## 2011-07-03 NOTE — Progress Notes (Signed)
  Subjective:    Patient ID: Robert Frey, male    DOB: 1960-10-15, 51 y.o.   MRN: [id number]  HPI Patient presents to clinic with 1 week of left shoulder pain. He denies any recent injury. He has had a history of shoulder problems 10-15 years ago he had a partial tear rotator cuff that resolved with cortisone shot. He also has a history of bone spurs. He complains of pain of  7/10 currently but he reports at night it increases to 10/10.He denies numbness or tingling of left arm. He has used heating pad and Advil 400mg  every 6 hours. He is also taking tramadol for back pain and it has helped. Heating pad.    Review of Systems     Objective:   Physical Exam  Musculoskeletal:       Patient is able to abduct, adduct and internally and externally rotate;however, limited ROM and patient complains of pain while executing the maneuver. Hawkins impingement sign Positve. Tenderness to palpation over the biceptal groove anteriorly.          Assessment & Plan:  Left shoulder pain- sports medicine appointment made for 8:45 Wednesday February 6th with Dr. Jola Babinski for steroid injection. In the meantime increase advil to 800mg  TID continue with heat and ice alternating.

## 2011-07-06 ENCOUNTER — Ambulatory Visit: Payer: Self-pay | Admitting: Family Medicine

## 2011-08-21 ENCOUNTER — Other Ambulatory Visit: Payer: Self-pay | Admitting: Family Medicine

## 2011-08-24 ENCOUNTER — Telehealth: Payer: Self-pay | Admitting: *Deleted

## 2011-08-24 MED ORDER — ONDANSETRON 4 MG PO TBDP: 4.0000 mg | ORAL_TABLET | Freq: Three times a day (TID) | ORAL | Status: AC | PRN

## 2011-08-24 NOTE — Telephone Encounter (Signed)
Pt.notified

## 2011-08-24 NOTE — Telephone Encounter (Signed)
Pt wife calls and states that husband has the same thing she had early in the week. Nausea and vomiting and diarrhea. Wanted to know if you would send [facility]mg  to [facility] [address] [facility] [place]

## 2011-10-17 ENCOUNTER — Other Ambulatory Visit: Payer: Self-pay | Admitting: Family Medicine

## 2011-12-12 ENCOUNTER — Other Ambulatory Visit: Payer: Self-pay | Admitting: Family Medicine

## 2012-02-01 ENCOUNTER — Other Ambulatory Visit: Payer: Self-pay | Admitting: Family Medicine

## 2012-03-05 ENCOUNTER — Other Ambulatory Visit: Payer: Self-pay | Admitting: Family Medicine

## 2012-05-11 ENCOUNTER — Other Ambulatory Visit: Payer: Self-pay | Admitting: Family Medicine

## 2012-05-13 NOTE — Telephone Encounter (Signed)
Needs appointment

## 2012-06-10 ENCOUNTER — Ambulatory Visit (INDEPENDENT_AMBULATORY_CARE_PROVIDER_SITE_OTHER): Payer: 59 | Admitting: Family Medicine

## 2012-06-10 ENCOUNTER — Encounter: Payer: Self-pay | Admitting: Family Medicine

## 2012-06-10 VITALS — BP 125/70 | HR 112 | Resp 17 | Wt 233.0 lb

## 2012-06-10 DIAGNOSIS — J019 Acute sinusitis, unspecified: Secondary | ICD-10-CM

## 2012-06-10 MED ORDER — HYDROCODONE-HOMATROPINE 5-1.5 MG/5ML PO SYRP: 5.0000 mL | ORAL_SOLUTION | Freq: Every evening | ORAL | Status: DC | PRN

## 2012-06-10 MED ORDER — AMOXICILLIN-POT CLAVULANATE 875-125 MG PO TABS: 1.0000 | ORAL_TABLET | Freq: Two times a day (BID) | ORAL | Status: DC

## 2012-06-10 NOTE — Progress Notes (Signed)
  Subjective:    Patient ID: Robert Frey, male    DOB: 1961-05-11, 52 y.o.   MRN: [id number]  HPI Nasal congestion and facial pain x 6 days.  Bilateral sxs. Pressure in both ears.  No ST.  No fever.  Using OTC nasal spray by Mucinex.  + sick contacts.  Cough in am and at night form post nasal drip.     Review of Systems     Objective:   Physical Exam  Constitutional: He is oriented to person, place, and time. He appears well-developed and well-nourished.  HENT:  Head: Normocephalic and atraumatic.  Right Ear: External ear normal.  Left Ear: External ear normal.  Nose: Nose normal.  Mouth/Throat: Oropharynx is clear and moist.       Yellow crusting in her nose. TMs and canals are clear.   Eyes: Conjunctivae normal and EOM are normal. Pupils are equal, round, and reactive to light.  Neck: Neck supple. No thyromegaly present.  Cardiovascular: Normal rate and normal heart sounds.   Pulmonary/Chest: Effort normal and breath sounds normal.  Lymphadenopathy:    He has no cervical adenopathy.  Neurological: He is alert and oriented to person, place, and time.  Skin: Skin is warm and dry.  Psychiatric: He has a normal mood and affect.          Assessment & Plan:  Sinusitisi -Acute.  Will rx an antibiotic, ok ot fill if not if not better in a couple of days.  Will tx with augmentin.  Can continue mucinex.  Given rx cough med for bedtime.  Call if not better in one week.

## 2012-06-10 NOTE — Patient Instructions (Signed)

## 2012-06-12 ENCOUNTER — Telehealth: Payer: Self-pay | Admitting: *Deleted

## 2012-06-12 MED ORDER — FLUTICASONE PROPIONATE 50 MCG/ACT NA SUSP: 2.0000 | Freq: Every day | NASAL | Status: DC

## 2012-06-12 NOTE — Telephone Encounter (Signed)
Pt calls and was just seen in office and would like to know if you would give him a nasal spray

## 2012-06-12 NOTE — Telephone Encounter (Signed)
rx sent

## 2012-06-12 NOTE — Telephone Encounter (Signed)
Pt informed

## 2012-06-24 ENCOUNTER — Encounter: Payer: Self-pay | Admitting: Physician Assistant

## 2012-06-24 ENCOUNTER — Ambulatory Visit (INDEPENDENT_AMBULATORY_CARE_PROVIDER_SITE_OTHER): Payer: 59 | Admitting: Physician Assistant

## 2012-06-24 VITALS — BP 132/75 | HR 87 | Temp 98.0°F | Resp 18 | Wt 240.0 lb

## 2012-06-24 DIAGNOSIS — J329 Chronic sinusitis, unspecified: Secondary | ICD-10-CM

## 2012-06-24 DIAGNOSIS — R05 Cough: Secondary | ICD-10-CM

## 2012-06-24 DIAGNOSIS — R059 Cough, unspecified: Secondary | ICD-10-CM

## 2012-06-24 MED ORDER — HYDROCODONE-HOMATROPINE 5-1.5 MG/5ML PO SYRP: 5.0000 mL | ORAL_SOLUTION | Freq: Every evening | ORAL | Status: DC | PRN

## 2012-06-24 MED ORDER — METHYLPREDNISOLONE SODIUM SUCC 125 MG IJ SOLR
125.0000 mg | Freq: Once | INTRAMUSCULAR | Status: AC
  Administered 2012-06-24: 125 mg via INTRAMUSCULAR

## 2012-06-24 MED ORDER — DOXYCYCLINE HYCLATE 100 MG PO CAPS: 100.0000 mg | ORAL_CAPSULE | Freq: Two times a day (BID) | ORAL | Status: DC

## 2012-06-24 MED ORDER — MOMETASONE FUROATE 50 MCG/ACT NA SUSP: 2.0000 | Freq: Every day | NASAL | Status: DC

## 2012-06-24 NOTE — Progress Notes (Signed)
  Subjective:    Patient ID: Robert Frey, male    DOB: [birth year], 52 y.o.   MRN: [id number]  HPI Patient is a 52 yo male who presents to the clinic with sinus pressure, facial pain, congestion for 2 plus weeks. He was seen by Dr. Linford Arnold on 06/10/12 and given Augmenting for sinusitis. He has a history of getting recurrent sinusitis this time of year. He is on methotrexate and Enbrel which make it harder for him to fight off infection. He is using flonase and nasal saline and they do help some. He has tried OTc dayquil/nyquil/mucinex. He was seen last year by ENT and there was a procedure they wanted to do to help open up his sinuses but then his mother went into the hospital and he never went back. He has allergies and about 2 months ago. Denies any fever, chills, muscle pain, SOB. He does have a dry cough, mild ST, and ear congestion.     Review of Systems     Objective:   Physical Exam  Constitutional: He is oriented to person, place, and time. He appears well-developed and well-nourished.  HENT:  Head: Normocephalic and atraumatic.  Right Ear: External ear normal.  Left Ear: External ear normal.       TM's present with erythema but no blood or puss.  Maxillary tenderness bilaterally as well as frontal sinus tenderness. Right greater than left.  Turbinates red with rhinorhea.  Eyes:       Injected conjunctivia bilaterally/watery discharge.  Neck: Normal range of motion. Neck supple.       Slightly enlarge supeficial anterior cervical adenopathy.  Cardiovascular: Normal rate, regular rhythm and normal heart sounds.   Pulmonary/Chest: Effort normal and breath sounds normal. He has no wheezes.  Neurological: He is alert and oriented to person, place, and time.  Skin: Skin is warm and dry.  Psychiatric: He has a normal mood and affect. His behavior is normal.          Assessment & Plan:  Recurrent sinusitis- Would like for pt to see ENT because procedure may help these  infections from coming. Did put on doxy for 10 days. Switched flonase to nasonex to see if works better. Once finished with abx start taking Zyrtec D daily. Refilled cough syrup. Discussed other symptomatic care.

## 2012-06-24 NOTE — Patient Instructions (Addendum)
Use vasoline in nostrils to help with healing. Continue using nasonex once a day. Will give doxycycline for 10 days. Will give shot today. Once finished antibiotic I want you to try Zrytec D or claritin D daily with nasal spray. Call if not improving in 10 days. Cough syrup given to use at night.   Sore Throat Warm honey/gargle with salt water/lozengers/Tylenol

## 2012-07-01 ENCOUNTER — Telehealth: Payer: Self-pay | Admitting: *Deleted

## 2012-07-01 MED ORDER — NYSTATIN 100000 UNIT/ML MT SUSP: 100000.0000 [IU] | Freq: Four times a day (QID) | OROMUCOSAL | Status: DC

## 2012-07-01 NOTE — Telephone Encounter (Signed)
Pt.notified

## 2012-07-01 NOTE — Telephone Encounter (Signed)
Pt calls and states that he has yeast in his mouth due to the antibiotics he has been on. Please send to [facility] [address] in [place]

## 2012-07-01 NOTE — Telephone Encounter (Signed)
Sent mouthwash to pharmacy to use for 10 days.

## 2012-07-05 ENCOUNTER — Telehealth: Payer: Self-pay | Admitting: *Deleted

## 2012-07-05 DIAGNOSIS — J329 Chronic sinusitis, unspecified: Secondary | ICD-10-CM

## 2012-07-05 MED ORDER — FLUCONAZOLE 150 MG PO TABS: 150.0000 mg | ORAL_TABLET | Freq: Once | ORAL | Status: DC

## 2012-07-05 NOTE — Telephone Encounter (Signed)
LMOM informing pt

## 2012-07-05 NOTE — Telephone Encounter (Signed)
Will send over diflucan. Will get CT of sinuses.

## 2012-07-05 NOTE — Telephone Encounter (Signed)
Pt calls & states that he is still sick.  Would you like for him to come back in?  Also he is asking if you can send him in a difucan pill.  The mouth wash only helps for about an hour.  Please advise

## 2012-07-13 ENCOUNTER — Other Ambulatory Visit: Payer: Self-pay | Admitting: Family Medicine

## 2012-07-15 NOTE — Telephone Encounter (Signed)
Ok to fill this. 

## 2012-07-29 ENCOUNTER — Encounter: Payer: Self-pay | Admitting: Physician Assistant

## 2012-07-29 ENCOUNTER — Ambulatory Visit (INDEPENDENT_AMBULATORY_CARE_PROVIDER_SITE_OTHER): Payer: 59 | Admitting: Physician Assistant

## 2012-07-29 ENCOUNTER — Ambulatory Visit: Payer: 59

## 2012-07-29 VITALS — BP 140/73 | HR 108 | Temp 98.5°F | Wt 228.0 lb

## 2012-07-29 DIAGNOSIS — D849 Immunodeficiency, unspecified: Secondary | ICD-10-CM

## 2012-07-29 DIAGNOSIS — R0602 Shortness of breath: Secondary | ICD-10-CM

## 2012-07-29 DIAGNOSIS — R062 Wheezing: Secondary | ICD-10-CM

## 2012-07-29 DIAGNOSIS — J209 Acute bronchitis, unspecified: Secondary | ICD-10-CM

## 2012-07-29 MED ORDER — HYDROCODONE-HOMATROPINE 5-1.5 MG/5ML PO SYRP: 5.0000 mL | ORAL_SOLUTION | Freq: Every evening | ORAL | Status: DC | PRN

## 2012-07-29 MED ORDER — DOXYCYCLINE HYCLATE 100 MG PO CAPS: 100.0000 mg | ORAL_CAPSULE | Freq: Two times a day (BID) | ORAL | Status: DC

## 2012-07-29 MED ORDER — ALBUTEROL SULFATE HFA 108 (90 BASE) MCG/ACT IN AERS: 2.0000 | INHALATION_SPRAY | Freq: Four times a day (QID) | RESPIRATORY_TRACT | Status: DC | PRN

## 2012-07-29 MED ORDER — ALBUTEROL SULFATE (2.5 MG/3ML) 0.083% IN NEBU: 2.5000 mg | INHALATION_SOLUTION | Freq: Once | RESPIRATORY_TRACT | Status: DC

## 2012-07-29 NOTE — Progress Notes (Signed)
  Subjective:    Patient ID: Robert Frey, male    DOB: 11-Dec-1960, 52 y.o.   MRN: [id number]  HPI Patient presents to the clinic with coughing and wheezing for the last 3 days. Pt has a history of infections due to his immune status being on methotrexate. Denies asthma. Does have history of getting pneumonia. Cough is dry. Denies any fever. Has not been taking anything to make better and continues to get worse. Chest feels like it is being sat on. He does feel like he has had chills. Denies and n/v/d, ear pain, or ST.  Review of Systems     Objective:   Physical Exam  Constitutional: He is oriented to person, place, and time. He appears well-developed and well-nourished.  HENT:  Head: Normocephalic and atraumatic.  Right Ear: External ear normal.  Left Ear: External ear normal.  Nose: Nose normal.  Mouth/Throat: Oropharynx is clear and moist. No oropharyngeal exudate.  Eyes: Conjunctivae are normal.  Neck: Normal range of motion. Neck supple.  Cardiovascular: Regular rhythm and normal heart sounds.   Tachycardia at 108.  Pulmonary/Chest:  Decreased effort. Wheezing bilaterally. Coarse breath sounds.   Musculoskeletal: Normal range of motion.  Lymphadenopathy:    He has no cervical adenopathy.  Neurological: He is alert and oriented to person, place, and time.  Skin:  Cheeks flushed.   Psychiatric: He has a normal mood and affect. His behavior is normal.          Assessment & Plan:  Bronchitis with asmatic component- nebulizer 2.5 albuterol given in office. Will get chest xray to make sure no pneumonia since immunosuppressed. Gave Doxy for 10 days. Hycodan for cough at night. Inhaler to use every 4-6 hours as needed for SOB or wheezing. Call if not improving could consider steroid if not improving.

## 2012-07-29 NOTE — Patient Instructions (Signed)
Will call with chest x-ray results. Start antibiotic. Start using inhaler. Call if not improving and will consider steroid if not improving.

## 2012-08-06 ENCOUNTER — Other Ambulatory Visit: Payer: Self-pay | Admitting: Physician Assistant

## 2012-08-08 ENCOUNTER — Ambulatory Visit: Payer: Self-pay | Admitting: Sports Medicine

## 2012-08-14 ENCOUNTER — Ambulatory Visit (INDEPENDENT_AMBULATORY_CARE_PROVIDER_SITE_OTHER): Payer: 59 | Admitting: Physician Assistant

## 2012-08-14 ENCOUNTER — Encounter: Payer: Self-pay | Admitting: Physician Assistant

## 2012-08-14 VITALS — BP 138/81 | HR 87 | Wt 236.0 lb

## 2012-08-14 DIAGNOSIS — J302 Other seasonal allergic rhinitis: Secondary | ICD-10-CM

## 2012-08-14 DIAGNOSIS — H6980 Other specified disorders of Eustachian tube, unspecified ear: Secondary | ICD-10-CM

## 2012-08-14 DIAGNOSIS — J3489 Other specified disorders of nose and nasal sinuses: Secondary | ICD-10-CM

## 2012-08-14 DIAGNOSIS — J309 Allergic rhinitis, unspecified: Secondary | ICD-10-CM

## 2012-08-14 DIAGNOSIS — H6981 Other specified disorders of Eustachian tube, right ear: Secondary | ICD-10-CM

## 2012-08-14 DIAGNOSIS — H6990 Unspecified Eustachian tube disorder, unspecified ear: Secondary | ICD-10-CM

## 2012-08-14 MED ORDER — METHYLPREDNISOLONE SODIUM SUCC 125 MG IJ SOLR
125.0000 mg | Freq: Once | INTRAMUSCULAR | Status: AC
  Administered 2012-08-14: 125 mg via INTRAMUSCULAR

## 2012-08-14 MED ORDER — CICLESONIDE 50 MCG/ACT NA SUSP: 2.0000 | Freq: Every day | NASAL | Status: DC

## 2012-08-14 NOTE — Patient Instructions (Addendum)
Switched to [employer] instead of nasonex.  Will give shot today.  Continue on zyrtec and occasionally try decongestants.  Will refer allergist.

## 2012-08-14 NOTE — Progress Notes (Signed)
  Subjective:    Patient ID: Robert Frey, male    DOB: 09-30-1960, 52 y.o.   MRN: [id number]  HPI Patient presents to the clinic with right sided jaw and ear pain. He recently finished abx for bronchitis. He feels much better. He is not using the albuterol inhaler anymore. He uses nasonex daily. For the last week his right ear has begun to ache. Denies any drainage, fever, chills. He has started to have some head congestion. No wheezing, SOB, or cough. 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 Pain is present all the time 7/10 in nature. Hurts to eat. No ringing in ears.     Review of Systems     Objective:   Physical Exam  Constitutional: He is oriented to person, place, and time. He appears well-developed and well-nourished.  HENT:  Head: Normocephalic and atraumatic.  Left Ear: External ear normal.  Right TM is dull on one side and can see a light reflex on the other. No pus or blood. Slightly buldging.   Negative for maxillary sinus pressure with palpation.   Pain with palpation down right sided jaw line.   Red and swollen bilateral turbinates.  Eyes: Conjunctivae are normal.  Conjunctiva injected.  Neck: Normal range of motion. Neck supple.  Seems swollen and tender to touch under right ear and cervical lymphnodes enlarged.  Cardiovascular: Normal rate, regular rhythm and normal heart sounds.   Pulmonary/Chest: Effort normal and breath sounds normal. He has no wheezes.  Neurological: He is alert and oriented to person, place, and time.  Skin:  Dark circles under eyes.   Psychiatric: He has a normal mood and affect. His behavior is normal.          Assessment & Plan:  Right Eustation tube dysfunction- Switched nasal spray to omnaris from nasonex. Gave solu-medrol 125mg  to get ahead of inflammation. Continue on Zyrtec and add decongestant at first feeling of congestion/head cold. Will refer to allergist. Has been many years and may qualify  for allergy shots.

## 2012-09-09 ENCOUNTER — Other Ambulatory Visit: Payer: Self-pay | Admitting: Family Medicine

## 2012-09-10 ENCOUNTER — Encounter: Payer: Self-pay | Admitting: Family Medicine

## 2012-09-10 ENCOUNTER — Ambulatory Visit (INDEPENDENT_AMBULATORY_CARE_PROVIDER_SITE_OTHER): Payer: 59 | Admitting: Family Medicine

## 2012-09-10 VITALS — BP 149/86 | HR 117 | Ht 70.25 in | Wt 232.0 lb

## 2012-09-10 DIAGNOSIS — S134XXA Sprain of ligaments of cervical spine, initial encounter: Secondary | ICD-10-CM

## 2012-09-10 DIAGNOSIS — S139XXA Sprain of joints and ligaments of unspecified parts of neck, initial encounter: Secondary | ICD-10-CM

## 2012-09-10 MED ORDER — MELOXICAM 15 MG PO TABS: 15.0000 mg | ORAL_TABLET | Freq: Every day | ORAL | Status: DC
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
## 2012-09-20 ENCOUNTER — Other Ambulatory Visit: Payer: Self-pay | Admitting: Family Medicine

## 2012-10-11 ENCOUNTER — Ambulatory Visit (INDEPENDENT_AMBULATORY_CARE_PROVIDER_SITE_OTHER): Payer: 59 | Admitting: Family Medicine

## 2012-10-11 ENCOUNTER — Encounter: Payer: Self-pay | Admitting: Family Medicine

## 2012-10-11 VITALS — BP 139/81 | HR 95 | Wt 238.0 lb

## 2012-10-11 DIAGNOSIS — R3589 Other polyuria: Secondary | ICD-10-CM

## 2012-10-11 DIAGNOSIS — L4050 Arthropathic psoriasis, unspecified: Secondary | ICD-10-CM | POA: Insufficient documentation

## 2012-10-11 DIAGNOSIS — IMO0001 Reserved for inherently not codable concepts without codable children: Secondary | ICD-10-CM

## 2012-10-11 DIAGNOSIS — L409 Psoriasis, unspecified: Secondary | ICD-10-CM

## 2012-10-11 DIAGNOSIS — E783 Hyperchylomicronemia: Secondary | ICD-10-CM

## 2012-10-11 DIAGNOSIS — R7301 Impaired fasting glucose: Secondary | ICD-10-CM

## 2012-10-11 DIAGNOSIS — E785 Hyperlipidemia, unspecified: Secondary | ICD-10-CM

## 2012-10-11 DIAGNOSIS — L408 Other psoriasis: Secondary | ICD-10-CM

## 2012-10-11 DIAGNOSIS — R358 Other polyuria: Secondary | ICD-10-CM

## 2012-10-11 DIAGNOSIS — E1129 Type 2 diabetes mellitus with other diabetic kidney complication: Secondary | ICD-10-CM | POA: Insufficient documentation

## 2012-10-11 LAB — POCT GLYCOSYLATED HEMOGLOBIN (HGB A1C): Hemoglobin A1C: 7.8

## 2012-10-11 MED ORDER — METFORMIN HCL 500 MG PO TABS: 500.0000 mg | ORAL_TABLET | Freq: Two times a day (BID) | ORAL | Status: DC

## 2012-10-11 MED ORDER — PRAVASTATIN SODIUM 40 MG PO TABS: 40.0000 mg | ORAL_TABLET | Freq: Every day | ORAL | Status: DC

## 2012-10-11 NOTE — Progress Notes (Signed)
  Subjective:    Patient ID: Robert Frey, male    DOB: May 24, 1961, 52 y.o.   MRN: [id number]  HPI  Here today bc had labs done at work through [facility].  Sugar was over 200 and he had eaten 2 hours before. Has noticed inc thirst and urination.  Says + fam hx of DM.  Was also told TG  Were 600.  NO CP orSOB..  Had some indigestion. He has noticed a lot of change in vision. He says it's hard to read words on the TV screen even at 15 feet away.  Hyperlipidemia - See above.  He was on a statin at one point in time. He's not sure but he thinks it was Lipitor. He says that cause severe joint and muscle aches and so discontinued it. He has not tried anything else. Note he does have psoriasis and ankylosing spondylitis.  Review of Systems     Objective:   Physical Exam  Constitutional: He is oriented to person, place, and time. He appears well-developed and well-nourished.  Abdominal obesity  HENT:  Head: Normocephalic and atraumatic.  Cardiovascular: Normal rate, regular rhythm and normal heart sounds.   Pulmonary/Chest: Effort normal and breath sounds normal.  Neurological: He is alert and oriented to person, place, and time.  Skin: Skin is warm and dry.  Psychiatric: He has a normal mood and affect. His behavior is normal.          Assessment & Plan:  DM- New Dx.  A1C is 7.8.  Discussed new dx and diet and exercise needs and need for weight gain. Will start metformin and work on these thinks. Discussed potential side effects of the medication. Discussed referral for diabetic nutritions.  If can improve glucose TG usually improve as well. Will start statin. Also encouraged him to schedule an eye exam for the end of June or early July. Hopefully we can get his sugars down and reduce any corneal swelling that may be occurring before he gets his eyes examined and the lenses. He are to have a glucometer at home and encouraged him to check his sugars 2-3 times a week and bring in those  numbers for me to review at his followup in one month.  Hyperlipidemia - Work on low fat diet and exercise.  Will recheck lipid in about 1 months once glucose comes down. Will need to add a direct LD L. We'll start with pravastatin and see if he tolerates it well she has any problems or side effects then please call me immediately.  Time spent 25 minutes, greater than 50% of the time spent discussing his new diagnosis of diabetes and counseling about this condition and treatment options.

## 2012-10-14 ENCOUNTER — Emergency Department
Admission: EM | Admit: 2012-10-14 | Discharge: 2012-10-14 | Disposition: A | Discharge status: Home / Self Care | Payer: 59 | Source: Home / Self Care | Attending: Family Medicine | Admitting: Family Medicine

## 2012-10-14 ENCOUNTER — Encounter: Payer: Self-pay | Admitting: *Deleted

## 2012-10-14 DIAGNOSIS — J069 Acute upper respiratory infection, unspecified: Secondary | ICD-10-CM

## 2012-10-14 HISTORY — DX: Type 2 diabetes mellitus without complications: E11.9

## 2012-10-14 MED ORDER — DOXYCYCLINE HYCLATE 100 MG PO CAPS: 100.0000 mg | ORAL_CAPSULE | Freq: Two times a day (BID) | ORAL | Status: DC

## 2012-10-14 MED ORDER — BENZONATATE 200 MG PO CAPS: 200.0000 mg | ORAL_CAPSULE | Freq: Every day | ORAL | Status: DC

## 2012-10-14 NOTE — ED Provider Notes (Signed)
History     CSN: [id number]  Arrival date & time 10/14/12  4098   First MD Initiated Contact with Patient 10/14/12 1932      Chief Complaint  Patient presents with  . Sinusitis      HPI Comments: Patient complains of onset yesterday of fatigue, sore neck, sore throat, and myalgias.  Today he developed chills, sinus congestion, and a cough.    The history is provided by the patient.    Past Medical History  Diagnosis Date  . ALS (amyotrophic lateral sclerosis)   . COPD (chronic obstructive pulmonary disease)   . Diabetes mellitus without complication     Past Surgical History  Procedure Laterality Date  . Appendectomy    . Tonsillectomy and adenoidectomy      Family History  Problem Relation Age of Onset  . Alcohol abuse Other   . Depression Other   . Hypertension Other   . Hyperlipidemia Other   . Stroke Other     History  Substance Use Topics  . Smoking status: Former Smoker    Quit date: 08/13/2004  . Smokeless tobacco: Not on file  . Alcohol Use: No      Review of Systems + sore throat + cough No pleuritic pain No wheezing + nasal congestion + post-nasal drainage No sinus pain/pressure No itchy/red eyes ? earache No hemoptysis No SOB No fever, + chills + nausea No vomiting No abdominal pain No diarrhea No urinary symptoms No skin rashes + fatigue + myalgias No headache Used OTC meds without relief  Allergies  Zithromax  Home Medications   Current Outpatient Rx  Name  Route  Sig  Dispense  Refill  . benzonatate (TESSALON) 200 MG capsule   Oral   Take 1 capsule (200 mg total) by mouth at bedtime.   12 capsule   0   . ciclesonide (OMNARIS) 50 MCG/ACT nasal spray   Each Nare   Place 2 sprays into both nostrils daily.   12.5 g   3   . doxycycline (VIBRAMYCIN) 100 MG capsule   Oral   Take 1 capsule (100 mg total) by mouth 2 (two) times daily.   20 capsule   0   . etanercept (ENBREL SURECLICK) 50 MG/ML injection  Subcutaneous   Inject 50 mg into the skin as directed.           Marland Kitchen HYDROcodone-acetaminophen (NORCO/VICODIN) 5-325 MG per tablet   Oral   Take 1 tablet by mouth every 6 (six) hours as needed.         Marland Kitchen imipramine (TOFRANIL) 50 MG tablet      TAKE 4 TABLETS AT BEDTIME *NEED APPOINTMENT   120 tablet   1   . meloxicam (MOBIC) 15 MG tablet   Oral   Take 1 tablet (15 mg total) by mouth daily.   30 tablet   2   . metFORMIN (GLUCOPHAGE) 500 MG tablet   Oral   Take 1 tablet (500 mg total) by mouth 2 (two) times daily with a meal.   60 tablet   3   . methotrexate (RHEUMATREX) 2.5 MG tablet   Oral   Take 7.5 mg by mouth. Injection directed to draw up .8 mgs by RA physician          . pravastatin (PRAVACHOL) 40 MG tablet   Oral   Take 1 tablet (40 mg total) by mouth at bedtime.   30 tablet   1   . traMADol (ULTRAM)  50 MG tablet   Oral   Take 50 mg by mouth.             BP 127/73  Pulse 97  Temp(Src) 98.6 F (37 C) (Oral)  Resp 20  Ht 5\' 10"  (1.778 m)  Wt 234 lb 8 oz (106.369 kg)  BMI 33.65 kg/m2  SpO2 94%  Physical Exam Nursing notes and Vital Signs reviewed. Appearance:  Patient appears stated age, and in no acute distress.  Patient is obese (BMI 33.7) Eyes:  Pupils are equal, round, and reactive to light and accomodation.  Extraocular movement is intact.  Conjunctivae are not inflamed  Ears:  Canals normal.  Tympanic membranes normal.  Nose:  Mildly congested turbinates.  No sinus tenderness.   Pharynx:  Normal Neck:  Supple.   Tender shotty posterior nodes are palpated bilaterally  Lungs:   Few faint scattered wheezes bilaterally.  Breath sounds are equal.  Heart:  Regular rate and rhythm without murmurs, rubs, or gallops.  Abdomen:  Nontender without masses or hepatosplenomegaly.  Bowel sounds are present.  No CVA or flank tenderness.  Extremities:  No edema.  No calf tenderness Skin:  No rash present.   ED Course  Procedures  none      1. Acute  upper respiratory infections of unspecified site; suspect viral URI       MDM  With patient's immunocompromized state, will begin doxycycline.  Prescription written for Benzonatate [facility]) to take at bedtime for night-time cough.  Take Mucinex D (guaifenesin with decongestant) twice daily for congestion.  Increase fluid intake, rest. May use Afrin nasal spray (or generic oxymetazoline) twice daily for about 5 days.  Also recommend using saline nasal spray several times daily and saline nasal irrigation (AYR is a common brand) Stop all antihistamines for now, and other non-prescription cough/cold preparations. Follow-up with family doctor if not improving 7 to 10 days.        Lattie Haw, MD 10/15/12 1226

## 2012-10-14 NOTE — ED Notes (Signed)
Patient c/o sinus pressure, cough, body aches x 2 days.

## 2012-10-16 ENCOUNTER — Telehealth: Payer: Self-pay | Admitting: *Deleted

## 2012-10-16 MED ORDER — HYDROCODONE-HOMATROPINE 5-1.5 MG/5ML PO SYRP: 5.0000 mL | ORAL_SOLUTION | Freq: Every evening | ORAL | Status: DC | PRN

## 2012-10-16 NOTE — Telephone Encounter (Signed)
Pt calls & asks if you can send him in some cough syrup.  He was seen in urgent care yesterday & was given tessalon pearls but he states that they're not helping him.  Please advise.

## 2012-10-16 NOTE — Telephone Encounter (Signed)
Ok will send over cough syrup for bedtime.  It has hydrocodone in it so he can't take it and then drive.

## 2012-10-22 ENCOUNTER — Encounter: Payer: Self-pay | Admitting: Family Medicine

## 2012-10-22 ENCOUNTER — Ambulatory Visit (INDEPENDENT_AMBULATORY_CARE_PROVIDER_SITE_OTHER): Payer: 59 | Admitting: Family Medicine

## 2012-10-22 VITALS — BP 135/87 | HR 117 | Temp 98.2°F | Wt 223.0 lb

## 2012-10-22 DIAGNOSIS — J329 Chronic sinusitis, unspecified: Secondary | ICD-10-CM

## 2012-10-22 DIAGNOSIS — A499 Bacterial infection, unspecified: Secondary | ICD-10-CM

## 2012-10-22 DIAGNOSIS — B9689 Other specified bacterial agents as the cause of diseases classified elsewhere: Secondary | ICD-10-CM

## 2012-10-22 MED ORDER — LEVOFLOXACIN 500 MG PO TABS: 500.0000 mg | ORAL_TABLET | Freq: Every day | ORAL | Status: DC

## 2012-10-22 NOTE — Progress Notes (Signed)
CC: Robert Frey is a 52 y.o. male is here for Sinusitis   Subjective: HPI:  Patient complains of  1-1/2 weeks of facial pressure nasal congestion associated with fatigue over the past weekend. Symptoms moderate in severity however became severe yesterday and unchanged today. Symptoms are worse when leaning forward, slightly improved with pepper nasal sprays. Improve for one day on doxycycline only to worsen midway through regimen. Denies fevers, chills, vision loss, motor or sensory disturbances, hearing loss, cough, shortness of breath, wheezing    Review Of Systems Outlined In HPI  Past Medical History  Diagnosis Date  . ALS (amyotrophic lateral sclerosis)   . COPD (chronic obstructive pulmonary disease)   . Diabetes mellitus without complication      Family History  Problem Relation Age of Onset  . Alcohol abuse Other   . Depression Other   . Hypertension Other   . Hyperlipidemia Other   . Stroke Other      History  Substance Use Topics  . Smoking status: Former Smoker    Quit date: 08/13/2004  . Smokeless tobacco: Not on file  . Alcohol Use: No     Objective: Filed Vitals:   10/22/12 1552  BP: 135/87  Pulse: 117  Temp: 98.2 F (36.8 C)    General: Alert and Oriented, No Acute Distress HEENT: Pupils equal, round, reactive to light. Conjunctivae clear.  External ears unremarkable, canals clear with intact TMs with appropriate landmarks.  Middle ear appears open without effusion. Pink inferior turbinates.  Moist mucous membranes, pharynx without inflammation nor lesions.  Neck supple without palpable lymphadenopathy nor abnormal masses. Frontal sinus tenderness to percussion bilaterally Lungs: Clear to auscultation bilaterally, no wheezing/ronchi/rales.  Comfortable work of breathing. Good air movement. Cardiac: Regular rate and rhythm. Normal S1/S2.  No murmurs, rubs, nor gallops.   Extremities: No peripheral edema.  Strong peripheral pulses.  Mental Status: No  depression, anxiety, nor agitation. Skin: Warm and dry.  Assessment & Plan: Robert Frey was seen today for sinusitis.  Diagnoses and associated orders for this visit:  Bacterial sinusitis - levofloxacin (LEVAQUIN) 500 MG tablet; Take 1 tablet (500 mg total) by mouth daily.    Bacterial sinusitis: Stepup therapy with levofloxacin and 80 mg Depo-Medrol today.Signs and symptoms requring emergent/urgent reevaluation were discussed with the patient.  Return if symptoms worsen or fail to improve.

## 2012-11-07 ENCOUNTER — Other Ambulatory Visit: Payer: Self-pay | Admitting: Family Medicine

## 2012-11-08 ENCOUNTER — Ambulatory Visit (INDEPENDENT_AMBULATORY_CARE_PROVIDER_SITE_OTHER): Payer: 59 | Admitting: Family Medicine

## 2012-11-08 ENCOUNTER — Encounter: Payer: Self-pay | Admitting: Family Medicine

## 2012-11-08 ENCOUNTER — Other Ambulatory Visit: Payer: Self-pay | Admitting: Family Medicine

## 2012-11-08 VITALS — BP 128/84 | HR 93 | Wt 224.0 lb

## 2012-11-08 DIAGNOSIS — IMO0001 Reserved for inherently not codable concepts without codable children: Secondary | ICD-10-CM

## 2012-11-08 LAB — POCT UA - MICROALBUMIN
Creatinine, POC: 200 mg/dL
Microalbumin Ur, POC: 30 mg/L

## 2012-11-08 MED ORDER — METFORMIN HCL 1000 MG PO TABS: 1000.0000 mg | ORAL_TABLET | Freq: Two times a day (BID) | ORAL | Status: DC

## 2012-11-08 NOTE — Addendum Note (Signed)
Addended by: Deno Etienne on: 11/08/2012 12:00 PM   Modules accepted: Orders

## 2012-11-08 NOTE — Progress Notes (Signed)
  Subjective:    Patient ID: Robert Frey, male    DOB: 1960-11-03, 52 y.o.   MRN: [id number]  HPI DM- Dx 6 weeks ago.  Checking sugars 3 times a day.  Never heard from nutrition referral. Doing well on metformin an dtolreating it well.  Sugars running 78-135. Mostly running 135.  Has lost 10 lbs.  Inc thirst is better. He feels tired today.  No hypoglycemic evetns.    Review of Systems No CP or SOB.     Objective:   Physical Exam  Constitutional: He is oriented to person, place, and time. He appears well-developed and well-nourished.  HENT:  Head: Normocephalic and atraumatic.  Cardiovascular: Normal rate, regular rhythm and normal heart sounds.   Pulmonary/Chest: Effort normal and breath sounds normal.  Neurological: He is alert and oriented to person, place, and time.  Skin: Skin is warm and dry.  Psychiatric: He has a normal mood and affect. His behavior is normal.          Assessment & Plan:  DM-will increase metformin to 1000mg .  he may notice some loose stools by increasing the dose. Because of her couple weeks and see if this gets better and I encouraged him to do so. If he starts having any lows or hypoglycemic events and please call the office. Otherwise I will see him back in August when he is due for his Nexium blood an A1c. We will check on the diabetes and nutrition referral as he has not been contacted about this yet. We did place a referral a month ago.  Foot exam performed today.

## 2012-12-04 ENCOUNTER — Other Ambulatory Visit: Payer: Self-pay | Admitting: Family Medicine

## 2012-12-05 ENCOUNTER — Other Ambulatory Visit: Payer: Self-pay

## 2013-01-05 ENCOUNTER — Other Ambulatory Visit: Payer: Self-pay | Admitting: Physician Assistant

## 2013-01-10 ENCOUNTER — Ambulatory Visit: Payer: Self-pay | Admitting: Family Medicine

## 2013-01-24 ENCOUNTER — Encounter: Payer: Self-pay | Admitting: *Deleted

## 2013-01-24 ENCOUNTER — Emergency Department
Admission: EM | Admit: 2013-01-24 | Discharge: 2013-01-24 | Disposition: A | Discharge status: Home / Self Care | Payer: Self-pay | Source: Home / Self Care | Attending: Family Medicine | Admitting: Family Medicine

## 2013-01-24 DIAGNOSIS — R062 Wheezing: Secondary | ICD-10-CM

## 2013-01-24 DIAGNOSIS — J329 Chronic sinusitis, unspecified: Secondary | ICD-10-CM

## 2013-01-24 HISTORY — DX: Ankylosing spondylitis of unspecified sites in spine: M45.9

## 2013-01-24 MED ORDER — PREDNISONE 50 MG PO TABS: ORAL_TABLET | ORAL | Status: DC

## 2013-01-24 MED ORDER — CEFDINIR 300 MG PO CAPS: 600.0000 mg | ORAL_CAPSULE | Freq: Every day | ORAL | Status: DC

## 2013-01-24 MED ORDER — METHYLPREDNISOLONE ACETATE 80 MG/ML IJ SUSP
80.0000 mg | Freq: Once | INTRAMUSCULAR | Status: AC
  Administered 2013-01-24: 80 mg via INTRAMUSCULAR

## 2013-01-24 MED ORDER — HYDROCOD POLST-CHLORPHEN POLST 10-8 MG/5ML PO LQCR
5.0000 mL | Freq: Two times a day (BID) | ORAL | Status: DC | PRN
Start: 2013-01-24 — End: 2013-01-27

## 2013-01-24 NOTE — ED Notes (Signed)
Weber c/o sinus pain/teeth pain and productive cough x 3 days. Taken Mucinex D.

## 2013-01-24 NOTE — ED Provider Notes (Signed)
CSN: [id number]     Arrival date & time 01/24/13  0808 History   First MD Initiated Contact with Patient 01/24/13 [phone]     Chief Complaint  Patient presents with  . Facial Pain  . Cough    HPI  URI Symptoms Onset: 3 days  Description: sinus pressure, nasal congestion, cough  Modifying factors:  Baseline hx/o AS; on enbrel and MTX  Symptoms Nasal discharge: yes Fever: mild Sore throat: no Cough: yes Wheezing: yes Ear pain: no GI symptoms: no Sick contacts: yes  Red Flags  Stiff neck: no Dyspnea: no Rash: no Swallowing difficulty: no  Sinusitis Risk Factors Headache/face pain: yes Double sickening: no tooth pain: yes  Allergy Risk Factors Sneezing: no Itchy scratchy throat: no Seasonal symptoms: no  Flu Risk Factors Headache: no muscle aches: no severe fatigue: no   Past Medical History  Diagnosis Date  . COPD (chronic obstructive pulmonary disease)   . Diabetes mellitus without complication   . Ankylosing spondylitis    Past Surgical History  Procedure Laterality Date  . Appendectomy    . Tonsillectomy and adenoidectomy     Family History  Problem Relation Age of Onset  . Alcohol abuse Other   . Depression Other   . Hypertension Other   . Hyperlipidemia Other   . Stroke Other    History  Substance Use Topics  . Smoking status: Former Smoker    Quit date: 08/13/2004  . Smokeless tobacco: Never Used  . Alcohol Use: No    Review of Systems  All other systems reviewed and are negative.    Allergies  Zithromax  Home Medications   Current Outpatient Rx  Name  Route  Sig  Dispense  Refill  . cefdinir (OMNICEF) 300 MG capsule   Oral   Take 2 capsules (600 mg total) by mouth daily.   20 capsule   0   . chlorpheniramine-HYDROcodone (TUSSIONEX PENNKINETIC ER) 10-8 MG/5ML LQCR   Oral   Take 5 mLs by mouth every 12 (twelve) hours as needed (cough).   60 mL   0   . ciclesonide (OMNARIS) 50 MCG/ACT nasal spray   Each Nare   Place 2  sprays into both nostrils daily.   12.5 g   3   . etanercept (ENBREL SURECLICK) 50 MG/ML injection   Subcutaneous   Inject 50 mg into the skin as directed.           Marland Kitchen HYDROcodone-acetaminophen (NORCO/VICODIN) 5-325 MG per tablet   Oral   Take 1 tablet by mouth every 6 (six) hours as needed.         Marland Kitchen imipramine (TOFRANIL) 50 MG tablet      TAKE 4 TABLETS AT BEDTIME *NEED APPOINTMENT   120 tablet   1   . meloxicam (MOBIC) 15 MG tablet      TAKE 1 TABLET (15 MG TOTAL) BY MOUTH DAILY.   30 tablet   2   . metFORMIN (GLUCOPHAGE) 1000 MG tablet   Oral   Take 1 tablet (1,000 mg total) by mouth 2 (two) times daily with a meal.   60 tablet   3   . methotrexate (RHEUMATREX) 2.5 MG tablet   Oral   Take 7.5 mg by mouth. Injection directed to draw up .8 mgs by RA physician          . pravastatin (PRAVACHOL) 40 MG tablet   Oral   Take 1 tablet (40 mg total) by mouth at bedtime.  30 tablet   1   . traMADol (ULTRAM) 50 MG tablet   Oral   Take 50 mg by mouth.            BP 124/73  Pulse 88  Temp(Src) 98.2 F (36.8 C) (Oral)  Resp 16  Wt 227 lb (102.967 kg)  BMI 32.57 kg/m2  SpO2 97% Physical Exam  Constitutional: He appears well-developed and well-nourished.  HENT:  Head: Normocephalic and atraumatic.  Eyes: Conjunctivae are normal. Pupils are equal, round, and reactive to light.  Neck: Normal range of motion. Neck supple.  Cardiovascular: Normal rate and regular rhythm.   Pulmonary/Chest: Effort normal. He has wheezes.  Abdominal: Soft.  Musculoskeletal: Normal range of motion.  Neurological: He is alert.  Skin: Skin is warm.    ED Course  Procedures (including critical care time) Labs Review Labs Reviewed - No data to display Imaging Review No results found.  MDM   1. Sinusitis   2. Wheezing    URI with secondary sinusitis in setting of immunosuppressant treatment.  Depomedrol 80mg  IM x1 for wheezing. Fairly mild on exam. Omnicef for  infectious coverage.  Rx for prednisone if wheezing fails to improve over the next 1-2 days. Discussed infectious and resp red flags at length. Follow up as needed.      The patient and/or caregiver has been counseled thoroughly with regard to treatment plan and/or medications prescribed including dosage, schedule, interactions, rationale for use, and possible side effects and they verbalize understanding. Diagnoses and expected course of recovery discussed and will return if not improved as expected or if the condition worsens. Patient and/or caregiver verbalized understanding.          Doree Albee, MD 01/24/13 [phone]

## 2013-01-27 ENCOUNTER — Emergency Department (INDEPENDENT_AMBULATORY_CARE_PROVIDER_SITE_OTHER): Payer: Managed Care, Other (non HMO)

## 2013-01-27 ENCOUNTER — Emergency Department
Admission: EM | Admit: 2013-01-27 | Discharge: 2013-01-27 | Disposition: A | Discharge status: Home / Self Care | Payer: Managed Care, Other (non HMO) | Source: Home / Self Care | Attending: Family Medicine | Admitting: Family Medicine

## 2013-01-27 ENCOUNTER — Encounter: Payer: Self-pay | Admitting: *Deleted

## 2013-01-27 DIAGNOSIS — R05 Cough: Secondary | ICD-10-CM

## 2013-01-27 DIAGNOSIS — J069 Acute upper respiratory infection, unspecified: Secondary | ICD-10-CM

## 2013-01-27 DIAGNOSIS — R059 Cough, unspecified: Secondary | ICD-10-CM

## 2013-01-27 DIAGNOSIS — J440 Chronic obstructive pulmonary disease with acute lower respiratory infection: Secondary | ICD-10-CM

## 2013-01-27 DIAGNOSIS — J209 Acute bronchitis, unspecified: Secondary | ICD-10-CM

## 2013-01-27 LAB — POCT CBC W AUTO DIFF (K'VILLE URGENT CARE)

## 2013-01-27 MED ORDER — IPRATROPIUM BROMIDE 0.02 % IN SOLN
0.5000 mg | Freq: Once | RESPIRATORY_TRACT | Status: AC
  Administered 2013-01-27: 0.5 mg via RESPIRATORY_TRACT

## 2013-01-27 MED ORDER — ALBUTEROL SULFATE HFA 108 (90 BASE) MCG/ACT IN AERS: 2.0000 | INHALATION_SPRAY | RESPIRATORY_TRACT | Status: DC | PRN

## 2013-01-27 MED ORDER — ALBUTEROL SULFATE (5 MG/ML) 0.5% IN NEBU: 2.5000 mg | INHALATION_SOLUTION | RESPIRATORY_TRACT | Status: DC

## 2013-01-27 MED ORDER — IPRATROPIUM BROMIDE 0.02 % IN SOLN: 0.5000 mg | RESPIRATORY_TRACT | Status: DC

## 2013-01-27 MED ORDER — GUAIFENESIN-CODEINE 100-10 MG/5ML PO SYRP: 10.0000 mL | ORAL_SOLUTION | Freq: Every day | ORAL | Status: DC

## 2013-01-27 MED ORDER — PREDNISONE 50 MG PO TABS: ORAL_TABLET | ORAL | Status: DC

## 2013-01-27 MED ORDER — HYDROCOD POLST-CHLORPHEN POLST 10-8 MG/5ML PO LQCR: 5.0000 mL | Freq: Two times a day (BID) | ORAL | Status: DC | PRN

## 2013-01-27 MED ORDER — ALBUTEROL SULFATE (5 MG/ML) 0.5% IN NEBU
2.5000 mg | INHALATION_SOLUTION | Freq: Once | RESPIRATORY_TRACT | Status: AC
  Administered 2013-01-27: 2.5 mg via RESPIRATORY_TRACT

## 2013-01-27 NOTE — ED Notes (Signed)
Pt c/o cough worse and chest congestion x 5 days.

## 2013-01-27 NOTE — ED Provider Notes (Addendum)
CSN: [id number]     Arrival date & time 01/27/13  1406 History   First MD Initiated Contact with Patient 01/27/13 1427     Chief Complaint  Patient presents with  . Cough      HPI Comments: Patient complains of increased cough and wheezing.  His sore throat has resolved, but he continues to have nasal congestion.  He continues to have a low grade fever to 99 and sweats.  His cough is partly productive.  No pleuritic pain.  He states that he has not filled prescription for prednisone.   The history is provided by the patient.    Past Medical History  Diagnosis Date  . COPD (chronic obstructive pulmonary disease)   . Diabetes mellitus without complication   . Ankylosing spondylitis    Past Surgical History  Procedure Laterality Date  . Appendectomy    . Tonsillectomy and adenoidectomy     Family History  Problem Relation Age of Onset  . Alcohol abuse Other   . Depression Other   . Hypertension Other   . Hyperlipidemia Other   . Stroke Other    History  Substance Use Topics  . Smoking status: Former Smoker    Quit date: 08/13/2004  . Smokeless tobacco: Never Used  . Alcohol Use: No    Review of Systems + sore throat, resolved + cough No pleuritic pain + wheezing + nasal congestion + post-nasal drainage + sinus pain/pressure No itchy/red eyes No earache No hemoptysis No SOB + fever, + chills No nausea No vomiting No abdominal pain No diarrhea No urinary symptoms No skin rashes + fatigue No myalgias No headache    Allergies  Zithromax  Home Medications   Current Outpatient Rx  Name  Route  Sig  Dispense  Refill  . albuterol (PROVENTIL HFA;VENTOLIN HFA) 108 (90 BASE) MCG/ACT inhaler   Inhalation   Inhale 2 puffs into the lungs every 4 (four) hours as needed for wheezing.   1 Inhaler   0   . cefdinir (OMNICEF) 300 MG capsule   Oral   Take 2 capsules (600 mg total) by mouth daily.   20 capsule   0   . ciclesonide (OMNARIS) 50 MCG/ACT nasal  spray   Each Nare   Place 2 sprays into both nostrils daily.   12.5 g   3   . etanercept (ENBREL SURECLICK) 50 MG/ML injection   Subcutaneous   Inject 50 mg into the skin as directed.           Marland Kitchen guaiFENesin-codeine (ROBITUSSIN AC) 100-10 MG/5ML syrup   Oral   Take 10 mLs by mouth at bedtime. for cough   120 mL   0   . HYDROcodone-acetaminophen (NORCO/VICODIN) 5-325 MG per tablet   Oral   Take 1 tablet by mouth every 6 (six) hours as needed.         Marland Kitchen imipramine (TOFRANIL) 50 MG tablet      TAKE 4 TABLETS AT BEDTIME *NEED APPOINTMENT   120 tablet   1   . meloxicam (MOBIC) 15 MG tablet      TAKE 1 TABLET (15 MG TOTAL) BY MOUTH DAILY.   30 tablet   2   . metFORMIN (GLUCOPHAGE) 1000 MG tablet   Oral   Take 1 tablet (1,000 mg total) by mouth 2 (two) times daily with a meal.   60 tablet   3   . methotrexate (RHEUMATREX) 2.5 MG tablet   Oral   Take 7.5  mg by mouth. Injection directed to draw up .8 mgs by RA physician          . pravastatin (PRAVACHOL) 40 MG tablet   Oral   Take 1 tablet (40 mg total) by mouth at bedtime.   30 tablet   1   . predniSONE (DELTASONE) 50 MG tablet      1 tab daily x 5 days   5 tablet   0   . traMADol (ULTRAM) 50 MG tablet   Oral   Take 50 mg by mouth.            BP 124/81  Pulse 120  Temp(Src) 98.3 F (36.8 C) (Oral)  Resp 18  Wt 219 lb (99.338 kg)  BMI 31.42 kg/m2  SpO2 98% Physical Exam Nursing notes and Vital Signs reviewed. Appearance:  Patient appears stated age, and in no acute distress.  He is alert and oriented.  Eyes:  Pupils are equal, round, and reactive to light and accomodation.  Extraocular movement is intact.  Conjunctivae are not inflamed  Ears:  Canals normal.  Tympanic membranes normal.  Nose:  Mildly congested turbinates.  No sinus tenderness.    Pharynx:  Normal Neck:  Supple.  No adenopathy Lungs:   Diffuse wheezes/rhonchi all fields.  Breath sounds are equal.  Heart:  Regular rate and  rhythm without murmurs, rubs, or gallops.  Rate 124 Abdomen:  Nontender without masses or hepatosplenomegaly.  Bowel sounds are present.  No CVA or flank tenderness.  Extremities:  No edema.  No calf tenderness Skin:  No rash present.   ED Course  Procedures none    Labs Reviewed  POCT CBC W AUTO DIFF ([facility])   WBC 11.0; LY 20.7; MO 4.0; GR 75.3; Hgb 16.9; Platelets 315    Imaging Review Dg Chest 2 View  01/27/2013   CLINICAL DATA:  Cough.  EXAM: CHEST  2 VIEW  COMPARISON:  10/06/2009  FINDINGS: Heart and mediastinal contours are within normal limits. No focal opacities or effusions. No acute bony abnormality. Mild peribronchial thickening.  IMPRESSION: Bronchitic changes.   Electronically Signed   By: Charlett Nose   On: 01/27/2013 15:25    MDM   1. Acute bronchitis; secondary to a viral URI COPD   Administered Duoneb by nebulizer in office; patient notes subjective decrease in wheezing. Continue Omnicef. Continue Mucinex daytime with plenty of fluids. Begin prednisone burst.  Add albuterol inhaler.  Cough suppressant at bedtime. Followup with Family Doctor if not improved in 4 to 6 days.    Lattie Haw, MD 01/27/13 1553  Lattie Haw, MD 01/27/13 [phone]

## 2013-03-03 ENCOUNTER — Other Ambulatory Visit: Payer: Self-pay | Admitting: Family Medicine

## 2013-03-03 ENCOUNTER — Other Ambulatory Visit: Payer: Self-pay | Admitting: Physician Assistant

## 2013-03-22 ENCOUNTER — Encounter: Payer: Self-pay | Admitting: Emergency Medicine

## 2013-03-22 ENCOUNTER — Emergency Department
Admission: EM | Admit: 2013-03-22 | Discharge: 2013-03-22 | Disposition: A | Discharge status: Home / Self Care | Payer: BC Managed Care – PPO | Source: Home / Self Care | Attending: Family Medicine | Admitting: Family Medicine

## 2013-03-22 DIAGNOSIS — J069 Acute upper respiratory infection, unspecified: Secondary | ICD-10-CM

## 2013-03-22 MED ORDER — PREDNISONE 20 MG PO TABS: 20.0000 mg | ORAL_TABLET | Freq: Two times a day (BID) | ORAL | Status: DC

## 2013-03-22 MED ORDER — HYDROCOD POLST-CHLORPHEN POLST 10-8 MG/5ML PO LQCR: 5.0000 mL | Freq: Every evening | ORAL | Status: DC | PRN

## 2013-03-22 MED ORDER — CEFDINIR 300 MG PO CAPS: ORAL_CAPSULE | ORAL | Status: DC

## 2013-03-22 NOTE — ED Provider Notes (Signed)
CSN: [id number]     Arrival date & time 03/22/13  1429 History   First MD Initiated Contact with Patient 03/22/13 1644     Chief Complaint  Patient presents with  . Sinus Problem    sore throat, runny nose, body aches, wheeze and cough      HPI Comments: Patient complains of four day history of sinus congestion, sore throat, chills, body aches.  He developed a non-productive cough two days ago.  The cough is worse at night.  The history is provided by the patient.    Past Medical History  Diagnosis Date  . COPD (chronic obstructive pulmonary disease)   . Diabetes mellitus without complication   . Ankylosing spondylitis    Past Surgical History  Procedure Laterality Date  . Appendectomy    . Tonsillectomy and adenoidectomy     Family History  Problem Relation Age of Onset  . Alcohol abuse Other   . Depression Other   . Hypertension Other   . Hyperlipidemia Other   . Stroke Other   . Diabetes Mother    History  Substance Use Topics  . Smoking status: Former Smoker    Quit date: 08/13/2004  . Smokeless tobacco: Never Used  . Alcohol Use: No    Review of Systems + sore throat + cough No pleuritic pain + wheezing + nasal congestion + post-nasal drainage No sinus pain/pressure No itchy/red eyes No earache No hemoptysis No SOB No fever, + chills/sweats No nausea No vomiting No abdominal pain No diarrhea No urinary symptoms No skin rashes + fatigue + myalgias No headache Used OTC meds without relief  Allergies  Zithromax  Home Medications   Current Outpatient Rx  Name  Route  Sig  Dispense  Refill  . ciclesonide (OMNARIS) 50 MCG/ACT nasal spray   Each Nare   Place 2 sprays into both nostrils daily.   12.5 g   3   . etanercept (ENBREL SURECLICK) 50 MG/ML injection   Subcutaneous   Inject 50 mg into the skin as directed.           Marland Kitchen HYDROcodone-acetaminophen (NORCO/VICODIN) 5-325 MG per tablet   Oral   Take 1 tablet by mouth every 6 (six)  hours as needed.         Marland Kitchen imipramine (TOFRANIL) 50 MG tablet      TAKE 4 TABLETS AT BEDTIME *NEED APPOINTMENT   120 tablet   1   . metFORMIN (GLUCOPHAGE) 1000 MG tablet      TAKE 1 TABLET (1,000 MG TOTAL) BY MOUTH 2 (TWO) TIMES DAILY WITH A MEAL.   60 tablet   3   . methotrexate (RHEUMATREX) 2.5 MG tablet   Oral   Take 7.5 mg by mouth. Injection directed to draw up .8 mgs by RA physician          . traMADol (ULTRAM) 50 MG tablet   Oral   Take 50 mg by mouth.           Marland Kitchen albuterol (PROVENTIL HFA;VENTOLIN HFA) 108 (90 BASE) MCG/ACT inhaler   Inhalation   Inhale 2 puffs into the lungs every 4 (four) hours as needed for wheezing.   1 Inhaler   0   . cefdinir (OMNICEF) 300 MG capsule      Take one cap by mouth twice daily   20 capsule   0   . chlorpheniramine-HYDROcodone (TUSSIONEX PENNKINETIC ER) 10-8 MG/5ML LQCR   Oral   Take 5 mLs by mouth  at bedtime as needed.   115 mL   0   . meloxicam (MOBIC) 15 MG tablet      TAKE 1 TABLET (15 MG TOTAL) BY MOUTH DAILY.   30 tablet   2   . pravastatin (PRAVACHOL) 40 MG tablet   Oral   Take 1 tablet (40 mg total) by mouth at bedtime.   30 tablet   1   . predniSONE (DELTASONE) 20 MG tablet   Oral   Take 1 tablet (20 mg total) by mouth 2 (two) times daily.   10 tablet   0    BP 149/89  Pulse 101  Temp(Src) 98 F (36.7 C) (Oral)  Ht 5\' 11"  (1.803 m)  Wt 214 lb (97.07 kg)  BMI 29.86 kg/m2  SpO2 98% Physical Exam Nursing notes and Vital Signs reviewed. Appearance:  Patient appears stated age, and in no acute distress Eyes:  Pupils are equal, round, and reactive to light and accomodation.  Extraocular movement is intact.  Conjunctivae are not inflamed  Ears:  Canals normal.  Tympanic membranes normal.  Nose:  Mildly congested turbinates.  No sinus tenderness.    Pharynx:  Minimal erythema Neck:  Supple.  Tender shotty posterior nodes are palpated bilaterally  Lungs:  Bilateral faint wheezes posteriorly.   Breath sounds are equal. No respiratory distress Heart:  Regular rate and rhythm without murmurs, rubs, or gallops.  Abdomen:  Nontender without masses or hepatosplenomegaly.  Bowel sounds are present.  No CVA or flank tenderness.  Extremities:  No edema.  No calf tenderness Skin:  No rash present.   ED Course  Procedures  none       MDM   1. Acute upper respiratory infections of unspecified site    With a history of frequent respiratory infections, on etanercept, will begin Omnicef, prednisone burst, and Tussionex at bedtime. Take Mucinex D (guaifenesin with decongestant) twice daily for congestion.  Increase fluid intake, rest. May use NeoSynephrine nasal spray (or generic oxymetazoline) twice daily for about 5 days.  Also recommend using saline nasal spray several times daily and saline nasal irrigation (AYR is a common brand).  Use Flonase spray after NeoSynephrine and saline rinse. Stop all antihistamines for now, and other non-prescription cough/cold preparations. Follow-up with family doctor if not improving about one week.    Lattie Haw, MD 03/22/13 2214

## 2013-03-22 NOTE — ED Notes (Signed)
Pt complains of sore throat, body ache, runny nose, wheezing and coughing. Pt denies fever and chill but has had night sweats. Body has been aching for 4 days, sore throat and runny nose for 3 days, and wheezing and coughing for 3 days as well. Production with cough is green. Pt has been using a nasal spray to "open up his nose" and DayQuil.

## 2013-03-31 ENCOUNTER — Telehealth: Payer: Self-pay | Admitting: Emergency Medicine

## 2013-03-31 ENCOUNTER — Ambulatory Visit (INDEPENDENT_AMBULATORY_CARE_PROVIDER_SITE_OTHER): Payer: BC Managed Care – PPO | Admitting: Family Medicine

## 2013-03-31 ENCOUNTER — Encounter: Payer: Self-pay | Admitting: Family Medicine

## 2013-03-31 VITALS — BP 136/76 | HR 96 | Wt 217.0 lb

## 2013-03-31 DIAGNOSIS — J0100 Acute maxillary sinusitis, unspecified: Secondary | ICD-10-CM

## 2013-03-31 MED ORDER — LEVOFLOXACIN 500 MG PO TABS: 500.0000 mg | ORAL_TABLET | Freq: Every day | ORAL | Status: AC

## 2013-03-31 MED ORDER — HYDROCOD POLST-CHLORPHEN POLST 10-8 MG/5ML PO LQCR
5.0000 mL | Freq: Every evening | ORAL | Status: DC | PRN
Start: 2013-03-31 — End: 2013-08-18

## 2013-03-31 NOTE — Progress Notes (Signed)
  Subjective:    Patient ID: Robert Frey, male    DOB: 1961/04/23, 51 y.o.   MRN: [id number]  HPI Cough and nasal congestion for 10 days. Say went to UC and given prednisone and omnicef an dfelt much better and then 3 days later felt worse again.  No fever. Has had sweats.  Mild ST in the AM.  + Postnasal drip.  Bilat facial pain maxillary.  Right is worse than left. Using sudafed PE.  No SOB.  Mild bilat ear pain.     Review of Systems     Objective:   Physical Exam  Constitutional: He is oriented to person, place, and time. He appears well-developed and well-nourished.  HENT:  Head: Normocephalic and atraumatic.  Right Ear: External ear normal.  Left Ear: External ear normal.  Nose: Nose normal.  Mouth/Throat: Oropharynx is clear and moist.  TMs and canals are clear.   Eyes: Conjunctivae and EOM are normal. Pupils are equal, round, and reactive to light.  Neck: Neck supple. No thyromegaly present.  Cardiovascular: Normal rate and normal heart sounds.   Pulmonary/Chest: Effort normal and breath sounds normal.  Diffuse rhonchi with some expiratory wheeze  Lymphadenopathy:    He has no cervical adenopathy.  Neurological: He is alert and oriented to person, place, and time.  Skin: Skin is warm and dry.  Psychiatric: He has a normal mood and affect.          Assessment & Plan:  Acute maxillary sinusitis/bronchitis - complete a course of Omnicef and felt better for a few days. He is symptomatic again. We'll treat with Levaquin 500 milligrams for 10 days. Continues symptomatic care. Call if not feel like he is better by the end of the week.

## 2013-04-16 LAB — BASIC METABOLIC PANEL
BUN: 15 mg/dL (ref 4–21)
CREATININE: 1 mg/dL (ref 0.6–1.3)
Glucose: 241 mg/dL
POTASSIUM: 3.7 mmol/L (ref 3.4–5.3)
SODIUM: 133 mmol/L — AB (ref 137–147)

## 2013-04-16 LAB — HEPATIC FUNCTION PANEL
ALK PHOS: 62 U/L (ref 25–125)
ALT: 32 U/L (ref 10–40)
AST: 31 U/L (ref 14–40)

## 2013-04-16 LAB — CBC AND DIFFERENTIAL
Hemoglobin: 16.1 g/dL (ref 13.5–17.5)
Platelets: 306 10*3/uL (ref 150–399)
WBC: 9.4 10^3/mL

## 2013-05-02 ENCOUNTER — Ambulatory Visit: Payer: Self-pay | Admitting: Physician Assistant

## 2013-05-02 DIAGNOSIS — Z0289 Encounter for other administrative examinations: Secondary | ICD-10-CM

## 2013-05-25 ENCOUNTER — Other Ambulatory Visit: Payer: Self-pay | Admitting: Physician Assistant

## 2013-06-01 ENCOUNTER — Other Ambulatory Visit: Payer: Self-pay | Admitting: Physician Assistant

## 2013-07-26 ENCOUNTER — Other Ambulatory Visit: Payer: Self-pay | Admitting: Physician Assistant

## 2013-07-27 ENCOUNTER — Other Ambulatory Visit: Payer: Self-pay | Admitting: Physician Assistant

## 2013-07-31 ENCOUNTER — Other Ambulatory Visit: Payer: Self-pay | Admitting: Physician Assistant

## 2013-08-12 ENCOUNTER — Encounter: Payer: Self-pay | Admitting: *Deleted

## 2013-08-18 ENCOUNTER — Ambulatory Visit (INDEPENDENT_AMBULATORY_CARE_PROVIDER_SITE_OTHER): Payer: Managed Care, Other (non HMO) | Admitting: Physician Assistant

## 2013-08-18 ENCOUNTER — Encounter: Payer: Self-pay | Admitting: Physician Assistant

## 2013-08-18 VITALS — BP 108/71 | HR 118 | Wt 214.0 lb

## 2013-08-18 DIAGNOSIS — T148 Other injury of unspecified body region: Secondary | ICD-10-CM

## 2013-08-18 DIAGNOSIS — W57XXXA Bitten or stung by nonvenomous insect and other nonvenomous arthropods, initial encounter: Secondary | ICD-10-CM

## 2013-08-18 LAB — CBC WITH DIFFERENTIAL/PLATELET
BASOS ABS: 0 10*3/uL (ref 0.0–0.1)
BASOS PCT: 0 % (ref 0–1)
EOS ABS: 0.3 10*3/uL (ref 0.0–0.7)
Eosinophils Relative: 2 % (ref 0–5)
HCT: 45.9 % (ref 39.0–52.0)
HEMOGLOBIN: 16.3 g/dL (ref 13.0–17.0)
Lymphocytes Relative: 25 % (ref 12–46)
Lymphs Abs: 3.5 10*3/uL (ref 0.7–4.0)
MCH: 32 pg (ref 26.0–34.0)
MCHC: 35.5 g/dL (ref 30.0–36.0)
MCV: 90.2 fL (ref 78.0–100.0)
MONOS PCT: 14 % — AB (ref 3–12)
Monocytes Absolute: 1.9 10*3/uL — ABNORMAL HIGH (ref 0.1–1.0)
NEUTROS ABS: 8.2 10*3/uL — AB (ref 1.7–7.7)
NEUTROS PCT: 59 % (ref 43–77)
PLATELETS: 319 10*3/uL (ref 150–400)
RBC: 5.09 MIL/uL (ref 4.22–5.81)
RDW: 13.9 % (ref 11.5–15.5)
WBC: 13.9 10*3/uL — ABNORMAL HIGH (ref 4.0–10.5)

## 2013-08-18 MED ORDER — DOXYCYCLINE HYCLATE 100 MG PO CAPS: ORAL_CAPSULE | ORAL | Status: DC

## 2013-08-18 NOTE — Progress Notes (Signed)
   Subjective:    Patient ID: Robert Frey, male    DOB: 12-May-1961, 53 y.o.   MRN: [id number]  HPI Patient is a 53 year old male who presents to the clinic after removing a tick from the back of his right knee last night. Patient is not aware of how long tick was watched. He does state that was very full and bulging. He has had some redness around the tick bite but denies any bull's-eye rash. Has not had a fever. Denies any chills. He has had a lot of body aches. His hands specifically her. Aching in the joints. He does have a history of arthritis however he feels like it's worse and over the past couple days. [name] not had anything to make better and nothing seems to make worse. He did remove the tick himself last night. He has no clue where he contracted to take. Has not been in the woods or outside very much.   Review of Systems     Objective:   Physical Exam  Constitutional: He is oriented to person, place, and time. He appears well-developed and well-nourished.  HENT:  Head: Normocephalic and atraumatic.  Cardiovascular: Regular rhythm and normal heart sounds.   Tachycardia at 118.  Pulmonary/Chest: Effort normal and breath sounds normal. He has no wheezes.  Neurological: He is alert and oriented to person, place, and time.  Skin:  Small 1 cm x 1 cm papule with central punctate bite mark. No bull's-eye rash present today.  Psychiatric: His behavior is normal.  Anxious          Assessment & Plan:  Tick bite-since patient is having some arthralgias we'll treat with one dose of doxycycline 200 mg. Discussed with patient if we checked for Lyme disease would likely be negative since tick bite was there recent. If still having symptoms could certainly test for Lyme disease in 2-6 weeks. Can certainly take NSAIDs to help with inflammation and pain patient should have another. Discussed with patient Mobic as an anti-inflammatory.Marland Kitchen Keep wound clean with soap and water as well as ice to  decrease swelling. Will check CBC today. I did give patient a handout on how to prevent future tick bites.

## 2013-08-18 NOTE — Patient Instructions (Signed)
Tick Bite Information Ticks are insects that attach themselves to the skin and draw blood for food. There are various types of ticks. Common types include wood ticks and deer ticks. Most ticks live in shrubs and grassy areas. Ticks can climb onto your body when you make contact with leaves or grass where the tick is waiting. The most common places on the body for ticks to attach themselves are the scalp, neck, armpits, waist, and groin. Most tick bites are harmless, but sometimes ticks carry germs that cause diseases. These germs can be spread to a person during the tick's feeding process. The chance of a disease spreading through a tick bite depends on:   The type of tick.  Time of year.   How long the tick is attached.   Geographic location.  HOW CAN YOU PREVENT TICK BITES? Take these steps to help prevent tick bites when you are outdoors:  Wear protective clothing. Long sleeves and long pants are best.   Wear white clothes so you can see ticks more easily.  Tuck your pant legs into your socks.   If walking on a trail, stay in the middle of the trail to avoid brushing against bushes.  Avoid walking through areas with long grass.  Put insect repellent on all exposed skin and along boot tops, pant legs, and sleeve cuffs.   Check clothing, hair, and skin repeatedly and before going inside.   Brush off any ticks that are not attached.  Take a shower or bath as soon as possible after being outdoors.  WHAT IS THE PROPER WAY TO REMOVE A TICK? Ticks should be removed as soon as possible to help prevent diseases caused by tick bites. 1. If latex gloves are available, put them on before trying to remove a tick.  2. Using fine-point tweezers, grasp the tick as close to the skin as possible. You may also use curved forceps or a tick removal tool. Grasp the tick as close to its head as possible. Avoid grasping the tick on its body. 3. Pull gently with steady upward pressure until  the tick lets go. Do not twist the tick or jerk it suddenly. This may break off the tick's head or mouth parts. 4. Do not squeeze or crush the tick's body. This could force disease-carrying fluids from the tick into your body.  5. After the tick is removed, wash the bite area and your hands with soap and water or other disinfectant such as alcohol. 6. Apply a small amount of antiseptic cream or ointment to the bite site.  7. Wash and disinfect any instruments that were used.  Do not try to remove a tick by applying a hot match, petroleum jelly, or fingernail polish to the tick. These methods do not work and may increase the chances of disease being spread from the tick bite.  WHEN SHOULD YOU SEEK MEDICAL CARE? Contact your health care provider if you are unable to remove a tick from your skin or if a part of the tick breaks off and is stuck in the skin.  After a tick bite, you need to be aware of signs and symptoms that could be related to diseases spread by ticks. Contact your health care provider if you develop any of the following in the days or weeks after the tick bite:  Unexplained fever.  Rash. A circular rash that appears days or weeks after the tick bite may indicate the possibility of Lyme disease. The rash may resemble   a target with a bull's-eye and may occur at a different part of your body than the tick bite.  Redness and swelling in the area of the tick bite.   Tender, swollen lymph glands.   Diarrhea.   Weight loss.   Cough.   Fatigue.   Muscle, joint, or bone pain.   Abdominal pain.   Headache.   Lethargy or a change in your level of consciousness.  Difficulty walking or moving your legs.   Numbness in the legs.   Paralysis.  Shortness of breath.   Confusion.   Repeated vomiting.  Document Released: 05/12/2000 Document Revised: 03/05/2013 Document Reviewed: 10/23/2012 [employer] Patient Information 2014 [employer].  

## 2013-09-12 ENCOUNTER — Other Ambulatory Visit: Payer: Self-pay | Admitting: Physician Assistant

## 2013-09-27 ENCOUNTER — Other Ambulatory Visit: Payer: Self-pay | Admitting: Physician Assistant

## 2013-11-11 ENCOUNTER — Other Ambulatory Visit: Payer: Self-pay | Admitting: Physician Assistant

## 2013-12-12 ENCOUNTER — Ambulatory Visit (INDEPENDENT_AMBULATORY_CARE_PROVIDER_SITE_OTHER): Payer: 59 | Admitting: Physician Assistant

## 2013-12-12 ENCOUNTER — Encounter: Payer: Self-pay | Admitting: Physician Assistant

## 2013-12-12 VITALS — BP 128/86 | HR 100 | Ht 71.0 in | Wt 228.0 lb

## 2013-12-12 DIAGNOSIS — E7800 Pure hypercholesterolemia, unspecified: Secondary | ICD-10-CM

## 2013-12-12 DIAGNOSIS — E1165 Type 2 diabetes mellitus with hyperglycemia: Principal | ICD-10-CM

## 2013-12-12 DIAGNOSIS — IMO0001 Reserved for inherently not codable concepts without codable children: Secondary | ICD-10-CM

## 2013-12-12 LAB — POCT GLYCOSYLATED HEMOGLOBIN (HGB A1C): HEMOGLOBIN A1C: 6.5

## 2013-12-12 LAB — POCT UA - MICROALBUMIN
Albumin/Creatinine Ratio, Urine, POC: <30
Creatinine, POC: 100 mg/dL
MICROALBUMIN (UR) POC: 30 mg/L

## 2013-12-12 MED ORDER — METFORMIN HCL 1000 MG PO TABS: ORAL_TABLET | ORAL | Status: DC

## 2013-12-12 MED ORDER — PRAVASTATIN SODIUM 40 MG PO TABS: 40.0000 mg | ORAL_TABLET | Freq: Every day | ORAL | Status: DC

## 2013-12-12 NOTE — Progress Notes (Signed)
   Subjective:    Patient ID: Robert Frey, male    DOB: 09-15-1960, 53 y.o.   MRN: [id number]  Diabetes He presents for his follow-up diabetic visit. He has type 2 diabetes mellitus. No MedicAlert identification noted. His disease course has been stable. (Shaky, sweaty eats and goes away happens once or twice a month. ) There are no diabetic associated symptoms. Pertinent negatives for hypoglycemia complications include no blackouts and no hospitalization. Symptoms are stable. There are no diabetic complications. Risk factors for coronary artery disease include dyslipidemia, family history, male sex, obesity and sedentary lifestyle. Current diabetic treatment includes oral agent (monotherapy). He is compliant with treatment most of the time. His weight is stable. He is following a high fat/cholesterol and generally unhealthy diet. When asked about meal planning, he reported none. He has not had a previous visit with a dietician. He rarely participates in exercise. His home blood glucose trend is decreasing steadily. (Checks sugar in am and pm.  Am- a little over 124  Pm- about 150-180) An ACE inhibitor/angiotensin II receptor blocker is not being taken. He does not see a podiatrist.Eye exam is not current.     Hypercholesterolemia- not currently taking pravachol. Needs refill.       Review of Systems  All other systems reviewed and are negative.      Objective:   Physical Exam  Constitutional: He is oriented to person, place, and time. He appears well-developed and well-nourished.  Obese.   HENT:  Head: Normocephalic and atraumatic.  Cardiovascular: Normal rate, regular rhythm and normal heart sounds.   Pulmonary/Chest: Effort normal and breath sounds normal. He has no wheezes.  Neurological: He is alert and oriented to person, place, and time.  Skin: Skin is dry.  Psychiatric: He has a normal mood and affect. His behavior is normal.          Assessment & Plan:  DM-  .Marland Kitchen Results for orders placed in visit on 12/12/13  POCT GLYCOSYLATED HEMOGLOBIN (HGB A1C)      Result Value Ref Range   Hemoglobin A1C 6.5    POCT UA - MICROALBUMIN      Result Value Ref Range   Microalbumin Ur, POC 30     Creatinine, POC 100     Albumin/Creatinine Ratio, Urine, POC <30     Encouraged pt that he is doing a great job controlling his sugars today. Refilled metformin 1000mg  bid today. Continue to check at least fasting sugars. Work on furthering good diet choices and regular exercise. Follow up in 3 months.   Hypercholesterolemia- not been taking. Start back on pravachol and recheck lipids in 3 months. Discussed low fat diet.

## 2014-01-19 ENCOUNTER — Ambulatory Visit (INDEPENDENT_AMBULATORY_CARE_PROVIDER_SITE_OTHER): Payer: 59 | Admitting: Physician Assistant

## 2014-01-19 ENCOUNTER — Ambulatory Visit (INDEPENDENT_AMBULATORY_CARE_PROVIDER_SITE_OTHER): Payer: 59

## 2014-01-19 ENCOUNTER — Encounter: Payer: Self-pay | Admitting: Physician Assistant

## 2014-01-19 VITALS — BP 142/79 | HR 123 | Ht 71.0 in | Wt 224.0 lb

## 2014-01-19 DIAGNOSIS — M79672 Pain in left foot: Secondary | ICD-10-CM

## 2014-01-19 DIAGNOSIS — M7732 Calcaneal spur, left foot: Secondary | ICD-10-CM

## 2014-01-19 DIAGNOSIS — M722 Plantar fascial fibromatosis: Secondary | ICD-10-CM

## 2014-01-19 DIAGNOSIS — M79609 Pain in unspecified limb: Secondary | ICD-10-CM

## 2014-01-19 DIAGNOSIS — M7730 Calcaneal spur, unspecified foot: Secondary | ICD-10-CM

## 2014-01-19 MED ORDER — MELOXICAM 15 MG PO TABS: 15.0000 mg | ORAL_TABLET | Freq: Every day | ORAL | Status: DC

## 2014-01-19 MED ORDER — OXYCODONE-ACETAMINOPHEN 5-325 MG PO TABS: 1.0000 | ORAL_TABLET | Freq: Three times a day (TID) | ORAL | Status: DC | PRN

## 2014-01-19 NOTE — Progress Notes (Signed)
   Subjective:    Patient ID: Robert Frey, male    DOB: September 09, 1960, 53 y.o.   MRN: [id number]  HPI Patient presents to the clinic with 2 weeks of left foot pain. He denies any trauma or injury. The pain is worse when he gets up in the morning and bears weight. It is also worse after walking for long periods of time. He is not even having some radiation pain coming from his heel and across his foot. He is on hydrocodone regularly from his rheumatologist. He still has significant now out of 10 pain at night that he feels like has no relief 4. He has not tried any anti-inflammatories. He does have a job where he is on his feet a lot.   Review of Systems  All other systems reviewed and are negative.      Objective:   Physical Exam  Constitutional: He is oriented to person, place, and time. He appears well-developed and well-nourished.  HENT:  Head: Normocephalic and atraumatic.  Cardiovascular: Normal rate, regular rhythm and normal heart sounds.   Pulmonary/Chest: Effort normal and breath sounds normal.  Musculoskeletal:  Evaluation of left foot-   Pain with plantar flexion. Pain over heel to palpation and over fascia.   Normal ROm of left foot. Strength 5/5.    Neurological: He is alert and oriented to person, place, and time.  Psychiatric: He has a normal mood and affect. His behavior is normal.          Assessment & Plan:  Plantar fasciitis, left foot/heel spur- xray confirmed no fracture but did see a heel spur that could be causing inflammation of plantar fascia. Pt requested just a few extra pain pills to sleep at night. Percocet #7 was given. mobic was started daily with breakfast. Discussed icing with frozen water bottle. Wear good supportive shoe and try to stay off of it for next couple of days. Exercises were given to start. If not improving follow up in 2 weeks with Dr. Sophronia Simas for possible injection and/or orthotics.

## 2014-01-19 NOTE — Patient Instructions (Signed)
Plantar Fasciitis (Heel Spur Syndrome) with Rehab The plantar fascia is a fibrous, ligament-like, soft-tissue structure that spans the bottom of the foot. Plantar fasciitis is a condition that causes pain in the foot due to inflammation of the tissue. SYMPTOMS   Pain and tenderness on the underneath side of the foot.  Pain that worsens with standing or walking. CAUSES  Plantar fasciitis is caused by irritation and injury to the plantar fascia on the underneath side of the foot. Common mechanisms of injury include:  Direct trauma to bottom of the foot.  Damage to a small nerve that runs under the foot where the main fascia attaches to the heel bone.  Stress placed on the plantar fascia due to bone spurs. RISK INCREASES WITH:   Activities that place stress on the plantar fascia (running, jumping, pivoting, or cutting).  Poor strength and flexibility.  Improperly fitted shoes.  Tight calf muscles.  Flat feet.  Failure to warm-up properly before activity.  Obesity. PREVENTION  Warm up and stretch properly before activity.  Allow for adequate recovery between workouts.  Maintain physical fitness:  Strength, flexibility, and endurance.  Cardiovascular fitness.  Maintain a health body weight.  Avoid stress on the plantar fascia.  Wear properly fitted shoes, including arch supports for individuals who have flat feet. PROGNOSIS  If treated properly, then the symptoms of plantar fasciitis usually resolve without surgery. However, occasionally surgery is necessary. RELATED COMPLICATIONS   Recurrent symptoms that may result in a chronic condition.  Problems of the lower back that are caused by compensating for the injury, such as limping.  Pain or weakness of the foot during push-off following surgery.  Chronic inflammation, scarring, and partial or complete fascia tear, occurring more often from repeated injections. TREATMENT  Treatment initially involves the use of  ice and medication to help reduce pain and inflammation. The use of strengthening and stretching exercises may help reduce pain with activity, especially stretches of the Achilles tendon. These exercises may be performed at home or with a therapist. Your caregiver may recommend that you use heel cups of arch supports to help reduce stress on the plantar fascia. Occasionally, corticosteroid injections are given to reduce inflammation. If symptoms persist for greater than 6 months despite non-surgical (conservative), then surgery may be recommended.  MEDICATION   If pain medication is necessary, then nonsteroidal anti-inflammatory medications, such as aspirin and ibuprofen, or other minor pain relievers, such as acetaminophen, are often recommended.  Do not take pain medication within 7 days before surgery.  Prescription pain relievers may be given if deemed necessary by your caregiver. Use only as directed and only as much as you need.  Corticosteroid injections may be given by your caregiver. These injections should be reserved for the most serious cases, because they may only be given a certain number of times. HEAT AND COLD  Cold treatment (icing) relieves pain and reduces inflammation. Cold treatment should be applied for 10 to 15 minutes every 2 to 3 hours for inflammation and pain and immediately after any activity that aggravates your symptoms. Use ice packs or massage the area with a piece of ice (ice massage).  Heat treatment may be used prior to performing the stretching and strengthening activities prescribed by your caregiver, physical therapist, or athletic trainer. Use a heat pack or soak the injury in warm water. SEEK IMMEDIATE MEDICAL CARE IF:  Treatment seems to offer no benefit, or the condition worsens.  Any medications produce adverse side effects. EXERCISES RANGE   OF MOTION (ROM) AND STRETCHING EXERCISES - Plantar Fasciitis (Heel Spur Syndrome) These exercises may help you  when beginning to rehabilitate your injury. Your symptoms may resolve with or without further involvement from your physician, physical therapist or athletic trainer. While completing these exercises, remember:   Restoring tissue flexibility helps normal motion to return to the joints. This allows healthier, less painful movement and activity.  An effective stretch should be held for at least 30 seconds.  A stretch should never be painful. You should only feel a gentle lengthening or release in the stretched tissue. RANGE OF MOTION - Toe Extension, Flexion  Sit with your right / left leg crossed over your opposite knee.  Grasp your toes and gently pull them back toward the top of your foot. You should feel a stretch on the bottom of your toes and/or foot.  Hold this stretch for __________ seconds.  Now, gently pull your toes toward the bottom of your foot. You should feel a stretch on the top of your toes and or foot.  Hold this stretch for __________ seconds. Repeat __________ times. Complete this stretch __________ times per day.  RANGE OF MOTION - Ankle Dorsiflexion, Active Assisted  Remove shoes and sit on a chair that is preferably not on a carpeted surface.  Place right / left foot under knee. Extend your opposite leg for support.  Keeping your heel down, slide your right / left foot back toward the chair until you feel a stretch at your ankle or calf. If you do not feel a stretch, slide your bottom forward to the edge of the chair, while still keeping your heel down.  Hold this stretch for __________ seconds. Repeat __________ times. Complete this stretch __________ times per day.  STRETCH - Gastroc, Standing  Place hands on wall.  Extend right / left leg, keeping the front knee somewhat bent.  Slightly point your toes inward on your back foot.  Keeping your right / left heel on the floor and your knee straight, shift your weight toward the wall, not allowing your back to  arch.  You should feel a gentle stretch in the right / left calf. Hold this position for __________ seconds. Repeat __________ times. Complete this stretch __________ times per day. STRETCH - Soleus, Standing  Place hands on wall.  Extend right / left leg, keeping the other knee somewhat bent.  Slightly point your toes inward on your back foot.  Keep your right / left heel on the floor, bend your back knee, and slightly shift your weight over the back leg so that you feel a gentle stretch deep in your back calf.  Hold this position for __________ seconds. Repeat __________ times. Complete this stretch __________ times per day. STRETCH - Gastrocsoleus, Standing  Note: This exercise can place a lot of stress on your foot and ankle. Please complete this exercise only if specifically instructed by your caregiver.   Place the ball of your right / left foot on a step, keeping your other foot firmly on the same step.  Hold on to the wall or a rail for balance.  Slowly lift your other foot, allowing your body weight to press your heel down over the edge of the step.  You should feel a stretch in your right / left calf.  Hold this position for __________ seconds.  Repeat this exercise with a slight bend in your right / left knee. Repeat __________ times. Complete this stretch __________ times per day.    STRENGTHENING EXERCISES - Plantar Fasciitis (Heel Spur Syndrome)  These exercises may help you when beginning to rehabilitate your injury. They may resolve your symptoms with or without further involvement from your physician, physical therapist or athletic trainer. While completing these exercises, remember:   Muscles can gain both the endurance and the strength needed for everyday activities through controlled exercises.  Complete these exercises as instructed by your physician, physical therapist or athletic trainer. Progress the resistance and repetitions only as guided. STRENGTH -  Towel Curls  Sit in a chair positioned on a non-carpeted surface.  Place your foot on a towel, keeping your heel on the floor.  Pull the towel toward your heel by only curling your toes. Keep your heel on the floor.  If instructed by your physician, physical therapist or athletic trainer, add ____________________ at the end of the towel. Repeat __________ times. Complete this exercise __________ times per day. STRENGTH - Ankle Inversion  Secure one end of a rubber exercise band/tubing to a fixed object (table, pole). Loop the other end around your foot just before your toes.  Place your fists between your knees. This will focus your strengthening at your ankle.  Slowly, pull your big toe up and in, making sure the band/tubing is positioned to resist the entire motion.  Hold this position for __________ seconds.  Have your muscles resist the band/tubing as it slowly pulls your foot back to the starting position. Repeat __________ times. Complete this exercises __________ times per day.  Document Released: 05/15/2005 Document Revised: 08/07/2011 Document Reviewed: 08/27/2008 [employer] Patient Information 2015 [employer]. This information is not intended to replace advice given to you by your health care provider. Make sure you discuss any questions you have with your health care provider.  

## 2014-01-20 ENCOUNTER — Other Ambulatory Visit: Payer: Self-pay | Admitting: Physician Assistant

## 2014-01-20 DIAGNOSIS — M19072 Primary osteoarthritis, left ankle and foot: Secondary | ICD-10-CM | POA: Insufficient documentation

## 2014-01-20 DIAGNOSIS — M7730 Calcaneal spur, unspecified foot: Secondary | ICD-10-CM | POA: Insufficient documentation

## 2014-02-16 ENCOUNTER — Ambulatory Visit (INDEPENDENT_AMBULATORY_CARE_PROVIDER_SITE_OTHER): Payer: 59 | Admitting: Physician Assistant

## 2014-02-16 ENCOUNTER — Encounter: Payer: Self-pay | Admitting: Physician Assistant

## 2014-02-16 ENCOUNTER — Ambulatory Visit (INDEPENDENT_AMBULATORY_CARE_PROVIDER_SITE_OTHER): Payer: 59 | Admitting: Sports Medicine

## 2014-02-16 ENCOUNTER — Encounter: Payer: Self-pay | Admitting: Sports Medicine

## 2014-02-16 VITALS — BP 119/62 | HR 99 | Ht 71.0 in | Wt 238.0 lb

## 2014-02-16 DIAGNOSIS — M19079 Primary osteoarthritis, unspecified ankle and foot: Secondary | ICD-10-CM

## 2014-02-16 DIAGNOSIS — M19072 Primary osteoarthritis, left ankle and foot: Secondary | ICD-10-CM

## 2014-02-16 DIAGNOSIS — M722 Plantar fascial fibromatosis: Secondary | ICD-10-CM

## 2014-02-16 MED ORDER — OXYCODONE-ACETAMINOPHEN 5-325 MG PO TABS: 1.0000 | ORAL_TABLET | Freq: Three times a day (TID) | ORAL | Status: DC | PRN

## 2014-02-16 MED ORDER — MELOXICAM 15 MG PO TABS: 15.0000 mg | ORAL_TABLET | Freq: Every day | ORAL | Status: DC

## 2014-02-16 NOTE — Progress Notes (Signed)
   Subjective:    Patient ID: ARTEZ REGIS, male    DOB: [birth year], 53 y.o.   MRN: [id number]  HPI Pt presents to the clinic with left foot pain that worsened after landing on left foot in a plantar flexed position. Since pain has been 9/10. Not able to put full weight on feet without pain. Not able to sleep. Was improving after last visit but worsened after injury.    Review of Systems  All other systems reviewed and are negative.      Objective:   Physical Exam  Musculoskeletal:  Left foot: plantar pain midfoot and dorsally lateral foot.  No pain over heel.  No swelling or bruising.           Assessment & Plan:  Plantar fasciitis/left foot osteoarthritis- see assement and plan by Dr. Dianah Field today who performed injection.  Pt given mobic to continue daily.  Percocet #7 were given for acute pain.  Exercise given to continue.  Follow up with Dr. Darene Lamer as documented.

## 2014-02-16 NOTE — Assessment & Plan Note (Signed)
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 Injection placed into the fourth metatarsal cuboid joint provided instantaneous relief. He did not have any pain at the calcaneal insertion of the plantar fascia. Over-the-counter NSAIDs, return for custom orthotics, then return to see me in one month.

## 2014-02-16 NOTE — Progress Notes (Signed)
   Subjective:    I'm seeing this patient as a consultation for:  Iran Planas, PA-C  CC: Left foot pain  HPI: This is a very pleasant 53 year old male with a long history of left foot pain, he localizes the pain on the plantar aspect of his midfoot, but also endorses some pain dorsally on the lateral midfoot. Pain is severe, persistent, is constant through the day and not worse in the morning. He actually denies pain at the calcaneal insertion of the plantar fascia.  Past medical history, Surgical history, Family history not pertinant except as noted below, Social history, Allergies, and medications have been entered into the medical record, reviewed, and no changes needed.   Review of Systems: No headache, visual changes, nausea, vomiting, diarrhea, constipation, dizziness, abdominal pain, skin rash, fevers, chills, night sweats, weight loss, swollen lymph nodes, body aches, joint swelling, muscle aches, chest pain, shortness of breath, mood changes, visual or auditory hallucinations.   Objective:   General: Well Developed, well nourished, and in no acute distress.  Neuro/Psych: Alert and oriented x3, extra-ocular muscles intact, able to move all 4 extremities, sensation grossly intact. Skin: Warm and dry, no rashes noted.  Respiratory: Not using accessory muscles, speaking in full sentences, trachea midline.  Cardiovascular: Pulses palpable, no extremity edema. Abdomen: Does not appear distended. Left Foot: No visible erythema or swelling. Range of motion is full in all directions. Strength is 5/5 in all directions. No hallux valgus. No pes cavus or pes planus. No abnormal callus noted. No pain over the navicular prominence, or base of fifth metatarsal. No tenderness to palpation of the calcaneal insertion of plantar fascia. No pain at the Achilles insertion. No pain over the calcaneal bursa. No pain of the retrocalcaneal bursa. The patient does have discrete tenderness to  palpation of the plantar aspect of the foot, somewhat around the midfoot, dorsally he has reproduction of his pain at the fourth metatarsal cuboid joint. He has no pain at the calcaneal insertion of the plantar fascia. No hallux rigidus or limitus. No tenderness palpation over interphalangeal joints. No pain with compression of the metatarsal heads. Neurovascularly intact distally.  On review of his x-rays personally, he does have a plantar calcaneal spur however he also has some degenerative changes at the base of the fourth metatarsal articulation with the cuboid bone.  Procedure: Real-time Ultrasound Guided Injection of left fourth metatarsal cuboid joint. Device: GE Logiq E  Verbal informed consent obtained.  Time-out conducted.  Noted no overlying erythema, induration, or other signs of local infection.  Skin prepped in a sterile fashion.  Local anesthesia: Topical Ethyl chloride.  With sterile technique and under real time ultrasound guidance: 25-gauge needle advanced into the joint, 1 cc kenalog 40, 2 cc lidocaine injected easily, insertion of the needle into the joint resulted in concordant pain.  Completed without difficulty  Pain immediately resolved suggesting accurate placement of the medication.  Advised to call if fevers/chills, erythema, induration, drainage, or persistent bleeding.  Images permanently stored and available for review in the ultrasound unit.  Impression: Technically successful ultrasound guided injection.  Impression and Recommendations:   This case required medical decision making of moderate complexity.

## 2014-02-16 NOTE — Patient Instructions (Signed)
Plantar Fasciitis (Heel Spur Syndrome) with Rehab The plantar fascia is a fibrous, ligament-like, soft-tissue structure that spans the bottom of the foot. Plantar fasciitis is a condition that causes pain in the foot due to inflammation of the tissue. SYMPTOMS   Pain and tenderness on the underneath side of the foot.  Pain that worsens with standing or walking. CAUSES  Plantar fasciitis is caused by irritation and injury to the plantar fascia on the underneath side of the foot. Common mechanisms of injury include:  Direct trauma to bottom of the foot.  Damage to a small nerve that runs under the foot where the main fascia attaches to the heel bone.  Stress placed on the plantar fascia due to bone spurs. RISK INCREASES WITH:   Activities that place stress on the plantar fascia (running, jumping, pivoting, or cutting).  Poor strength and flexibility.  Improperly fitted shoes.  Tight calf muscles.  Flat feet.  Failure to warm-up properly before activity.  Obesity. PREVENTION  Warm up and stretch properly before activity.  Allow for adequate recovery between workouts.  Maintain physical fitness:  Strength, flexibility, and endurance.  Cardiovascular fitness.  Maintain a health body weight.  Avoid stress on the plantar fascia.  Wear properly fitted shoes, including arch supports for individuals who have flat feet. PROGNOSIS  If treated properly, then the symptoms of plantar fasciitis usually resolve without surgery. However, occasionally surgery is necessary. RELATED COMPLICATIONS   Recurrent symptoms that may result in a chronic condition.  Problems of the lower back that are caused by compensating for the injury, such as limping.  Pain or weakness of the foot during push-off following surgery.  Chronic inflammation, scarring, and partial or complete fascia tear, occurring more often from repeated injections. TREATMENT  Treatment initially involves the use of  ice and medication to help reduce pain and inflammation. The use of strengthening and stretching exercises may help reduce pain with activity, especially stretches of the Achilles tendon. These exercises may be performed at home or with a therapist. Your caregiver may recommend that you use heel cups of arch supports to help reduce stress on the plantar fascia. Occasionally, corticosteroid injections are given to reduce inflammation. If symptoms persist for greater than 6 months despite non-surgical (conservative), then surgery may be recommended.  MEDICATION   If pain medication is necessary, then nonsteroidal anti-inflammatory medications, such as aspirin and ibuprofen, or other minor pain relievers, such as acetaminophen, are often recommended.  Do not take pain medication within 7 days before surgery.  Prescription pain relievers may be given if deemed necessary by your caregiver. Use only as directed and only as much as you need.  Corticosteroid injections may be given by your caregiver. These injections should be reserved for the most serious cases, because they may only be given a certain number of times. HEAT AND COLD  Cold treatment (icing) relieves pain and reduces inflammation. Cold treatment should be applied for 10 to 15 minutes every 2 to 3 hours for inflammation and pain and immediately after any activity that aggravates your symptoms. Use ice packs or massage the area with a piece of ice (ice massage).  Heat treatment may be used prior to performing the stretching and strengthening activities prescribed by your caregiver, physical therapist, or athletic trainer. Use a heat pack or soak the injury in warm water. SEEK IMMEDIATE MEDICAL CARE IF:  Treatment seems to offer no benefit, or the condition worsens.  Any medications produce adverse side effects. EXERCISES RANGE   OF MOTION (ROM) AND STRETCHING EXERCISES - Plantar Fasciitis (Heel Spur Syndrome) These exercises may help you  when beginning to rehabilitate your injury. Your symptoms may resolve with or without further involvement from your physician, physical therapist or athletic trainer. While completing these exercises, remember:   Restoring tissue flexibility helps normal motion to return to the joints. This allows healthier, less painful movement and activity.  An effective stretch should be held for at least 30 seconds.  A stretch should never be painful. You should only feel a gentle lengthening or release in the stretched tissue. RANGE OF MOTION - Toe Extension, Flexion  Sit with your right / left leg crossed over your opposite knee.  Grasp your toes and gently pull them back toward the top of your foot. You should feel a stretch on the bottom of your toes and/or foot.  Hold this stretch for __________ seconds.  Now, gently pull your toes toward the bottom of your foot. You should feel a stretch on the top of your toes and or foot.  Hold this stretch for __________ seconds. Repeat __________ times. Complete this stretch __________ times per day.  RANGE OF MOTION - Ankle Dorsiflexion, Active Assisted  Remove shoes and sit on a chair that is preferably not on a carpeted surface.  Place right / left foot under knee. Extend your opposite leg for support.  Keeping your heel down, slide your right / left foot back toward the chair until you feel a stretch at your ankle or calf. If you do not feel a stretch, slide your bottom forward to the edge of the chair, while still keeping your heel down.  Hold this stretch for __________ seconds. Repeat __________ times. Complete this stretch __________ times per day.  STRETCH - Gastroc, Standing  Place hands on wall.  Extend right / left leg, keeping the front knee somewhat bent.  Slightly point your toes inward on your back foot.  Keeping your right / left heel on the floor and your knee straight, shift your weight toward the wall, not allowing your back to  arch.  You should feel a gentle stretch in the right / left calf. Hold this position for __________ seconds. Repeat __________ times. Complete this stretch __________ times per day. STRETCH - Soleus, Standing  Place hands on wall.  Extend right / left leg, keeping the other knee somewhat bent.  Slightly point your toes inward on your back foot.  Keep your right / left heel on the floor, bend your back knee, and slightly shift your weight over the back leg so that you feel a gentle stretch deep in your back calf.  Hold this position for __________ seconds. Repeat __________ times. Complete this stretch __________ times per day. STRETCH - Gastrocsoleus, Standing  Note: This exercise can place a lot of stress on your foot and ankle. Please complete this exercise only if specifically instructed by your caregiver.   Place the ball of your right / left foot on a step, keeping your other foot firmly on the same step.  Hold on to the wall or a rail for balance.  Slowly lift your other foot, allowing your body weight to press your heel down over the edge of the step.  You should feel a stretch in your right / left calf.  Hold this position for __________ seconds.  Repeat this exercise with a slight bend in your right / left knee. Repeat __________ times. Complete this stretch __________ times per day.    STRENGTHENING EXERCISES - Plantar Fasciitis (Heel Spur Syndrome)  These exercises may help you when beginning to rehabilitate your injury. They may resolve your symptoms with or without further involvement from your physician, physical therapist or athletic trainer. While completing these exercises, remember:   Muscles can gain both the endurance and the strength needed for everyday activities through controlled exercises.  Complete these exercises as instructed by your physician, physical therapist or athletic trainer. Progress the resistance and repetitions only as guided. STRENGTH -  Towel Curls  Sit in a chair positioned on a non-carpeted surface.  Place your foot on a towel, keeping your heel on the floor.  Pull the towel toward your heel by only curling your toes. Keep your heel on the floor.  If instructed by your physician, physical therapist or athletic trainer, add ____________________ at the end of the towel. Repeat __________ times. Complete this exercise __________ times per day. STRENGTH - Ankle Inversion  Secure one end of a rubber exercise band/tubing to a fixed object (table, pole). Loop the other end around your foot just before your toes.  Place your fists between your knees. This will focus your strengthening at your ankle.  Slowly, pull your big toe up and in, making sure the band/tubing is positioned to resist the entire motion.  Hold this position for __________ seconds.  Have your muscles resist the band/tubing as it slowly pulls your foot back to the starting position. Repeat __________ times. Complete this exercises __________ times per day.  Document Released: 05/15/2005 Document Revised: 08/07/2011 Document Reviewed: 08/27/2008 [employer] Patient Information 2015 [employer]. This information is not intended to replace advice given to you by your health care provider. Make sure you discuss any questions you have with your health care provider.  

## 2014-03-14 ENCOUNTER — Other Ambulatory Visit: Payer: Self-pay | Admitting: Physician Assistant

## 2014-03-15 ENCOUNTER — Other Ambulatory Visit: Payer: Self-pay | Admitting: Physician Assistant

## 2014-03-18 ENCOUNTER — Encounter: Payer: Self-pay | Admitting: Sports Medicine

## 2014-04-04 ENCOUNTER — Other Ambulatory Visit: Payer: Self-pay | Admitting: Physician Assistant

## 2014-05-03 ENCOUNTER — Other Ambulatory Visit: Payer: Self-pay | Admitting: Physician Assistant

## 2014-05-14 ENCOUNTER — Encounter: Payer: Self-pay | Admitting: Family Medicine

## 2014-05-14 ENCOUNTER — Ambulatory Visit (INDEPENDENT_AMBULATORY_CARE_PROVIDER_SITE_OTHER): Payer: 59

## 2014-05-14 ENCOUNTER — Telehealth: Payer: Self-pay | Admitting: *Deleted

## 2014-05-14 ENCOUNTER — Ambulatory Visit (INDEPENDENT_AMBULATORY_CARE_PROVIDER_SITE_OTHER): Payer: 59 | Admitting: Family Medicine

## 2014-05-14 VITALS — BP 134/82 | HR 111 | Temp 98.0°F | Ht 71.0 in | Wt 224.0 lb

## 2014-05-14 DIAGNOSIS — R312 Other microscopic hematuria: Secondary | ICD-10-CM

## 2014-05-14 DIAGNOSIS — R1031 Right lower quadrant pain: Secondary | ICD-10-CM

## 2014-05-14 DIAGNOSIS — IMO0002 Reserved for concepts with insufficient information to code with codable children: Secondary | ICD-10-CM

## 2014-05-14 DIAGNOSIS — R3129 Other microscopic hematuria: Secondary | ICD-10-CM

## 2014-05-14 DIAGNOSIS — M545 Low back pain, unspecified: Secondary | ICD-10-CM

## 2014-05-14 DIAGNOSIS — N200 Calculus of kidney: Secondary | ICD-10-CM

## 2014-05-14 DIAGNOSIS — E1165 Type 2 diabetes mellitus with hyperglycemia: Secondary | ICD-10-CM

## 2014-05-14 LAB — POCT URINALYSIS DIPSTICK
BILIRUBIN UA: NEGATIVE
GLUCOSE UA: 100
Ketones, UA: NEGATIVE
LEUKOCYTES UA: NEGATIVE
NITRITE UA: NEGATIVE
Protein, UA: NEGATIVE
Spec Grav, UA: <=1.005
Urobilinogen, UA: 0.2
pH, UA: 5

## 2014-05-14 LAB — POCT GLYCOSYLATED HEMOGLOBIN (HGB A1C): HEMOGLOBIN A1C: 6.5

## 2014-05-14 MED ORDER — OXYCODONE-ACETAMINOPHEN 5-325 MG PO TABS: 1.0000 | ORAL_TABLET | Freq: Three times a day (TID) | ORAL | Status: DC | PRN

## 2014-05-14 NOTE — Telephone Encounter (Signed)
CT renal approval [id number] expires 06/28/2014. Margette Fast, RMA

## 2014-05-14 NOTE — Progress Notes (Signed)
Subjective:    Patient ID: Robert Frey, male    DOB: 11-Sep-1960, 53 y.o.   MRN: [id number]  HPI pt reports that the pain started 2 wks ago on his R side it goes straight thru. 8/10 sharp and throbbing, he does not have an appendix he said that it really bothers him when he first gets up in the morning. No nausea.  No diarrhea. No fever. Had a URI last week but feels that is better.  Pain started in his back on the right side and now pain is mostly in the RLQ but occ when intense radiates into his back. Says worse when stands up after has been sitting prolonged period. Says can feel every bump in the car.  Hx of appendectomy 20 yr ago.   Diabetes - no hypoglycemic events. No wounds or sores that are not healing well. No increased thirst or urination. Checking glucose at home. Taking medications as prescribed without any side effects.   Review of Systems  BP 134/82 mmHg  Pulse 111  Temp(Src) 98 F (36.7 C)  Ht 5\' 11"  (1.803 m)  Wt 224 lb (101.606 kg)  BMI 31.26 kg/m2  SpO2 100%    Allergies  Allergen Reactions  . Zithromax [Azithromycin Dihydrate] Rash    Past Medical History  Diagnosis Date  . COPD (chronic obstructive pulmonary disease)   . Diabetes mellitus without complication   . Ankylosing spondylitis     Past Surgical History  Procedure Laterality Date  . Appendectomy    . Tonsillectomy and adenoidectomy      History   Social History  . Marital Status: Married    Spouse Name: N/A    Number of Children: N/A  . Years of Education: N/A   Occupational History  . Not on file.   Social History Main Topics  . Smoking status: Current Some Day Smoker    Last Attempt to Quit: 08/13/2004  . Smokeless tobacco: Never Used  . Alcohol Use: No  . Drug Use: No  . Sexual Activity: Not on file   Other Topics Concern  . Not on file   Social History Narrative    Family History  Problem Relation Age of Onset  . Alcohol abuse Other   . Depression Other   .  Hypertension Other   . Hyperlipidemia Other   . Stroke Other   . Diabetes Mother     Outpatient Encounter Prescriptions as of 05/14/2014  Medication Sig  . Adalimumab (HUMIRA [place]) Inject into the skin.  Marland Kitchen imipramine (TOFRANIL) 50 MG tablet TAKE 4 TABLETS AT BEDTIME *NEED APPOINTMENT  . meloxicam (MOBIC) 15 MG tablet TAKE 1 TABLET BY MOUTH EVERY DAY  . metFORMIN (GLUCOPHAGE) 1000 MG tablet TAKE 1 TABLET TWICE A DAY WITH FOOD  . methotrexate (RHEUMATREX) 2.5 MG tablet Take 7.5 mg by mouth. Injection directed to draw up .8 mgs by RA physician   . oxyCODONE-acetaminophen (PERCOCET/ROXICET) 5-325 MG per tablet Take 1 tablet by mouth every 8 (eight) hours as needed for severe pain.  . pravastatin (PRAVACHOL) 40 MG tablet TAKE 1 TABLET (40 MG TOTAL) BY MOUTH AT BEDTIME.  . [DISCONTINUED] oxyCODONE-acetaminophen (PERCOCET/ROXICET) 5-325 MG per tablet Take 1 tablet by mouth every 8 (eight) hours as needed for severe pain.          Objective:   Physical Exam  Constitutional: He is oriented to person, place, and time. He appears well-developed and well-nourished.  HENT:  Head: Normocephalic and atraumatic.  Cardiovascular:  Normal rate, regular rhythm and normal heart sounds.   Pulmonary/Chest: Effort normal and breath sounds normal.  Abdominal: Soft. Bowel sounds are normal. He exhibits no distension and no mass. There is tenderness. There is no rebound and no guarding.  +TTP on the LLQ with some guarding. No rebound.    Neurological: He is alert and oriented to person, place, and time.  Skin: Skin is warm and dry.  Psychiatric: He has a normal mood and affect. His behavior is normal.          Assessment & Plan:  RLQ pain - suspect kidney stone.  He has TTP and guarding.  Consider diverticulitis but with no change in bowel movements or nausea this is less likely. He's had his appendix removed 20 years ago that he says the pain is very similar. If it is a kidney stone we will see if it  small enough for him to pass at home and will provide pain medications for him to use. Given urine strainer as well.   DM- well controlled.  On statin and metformin. F/U in 3 months. Reminded to get UTD eye exam.

## 2014-05-15 ENCOUNTER — Encounter: Payer: Self-pay | Admitting: Physician Assistant

## 2014-05-15 ENCOUNTER — Telehealth: Payer: Self-pay

## 2014-05-15 DIAGNOSIS — R911 Solitary pulmonary nodule: Secondary | ICD-10-CM | POA: Insufficient documentation

## 2014-05-15 DIAGNOSIS — IMO0001 Reserved for inherently not codable concepts without codable children: Secondary | ICD-10-CM | POA: Insufficient documentation

## 2014-05-15 NOTE — Telephone Encounter (Signed)
Call pt: small kidney stone in upper right but should not be causing any pain. 52mm lung nodule since does smoke needs repeat CT scan of chest in 1 year to follow up and make sure not changing.

## 2014-05-15 NOTE — Telephone Encounter (Signed)
Robert Frey is calling for CT results. Could you give results and recommendations for CT report?

## 2014-05-15 NOTE — Telephone Encounter (Signed)
Patient advised of results and recommendations. He is still having pain. I advised him if this continued over the weekend he will need to go to the ED and not wait until Monday.

## 2014-05-18 ENCOUNTER — Other Ambulatory Visit: Payer: Self-pay | Admitting: Physician Assistant

## 2014-05-30 ENCOUNTER — Other Ambulatory Visit: Payer: Self-pay | Admitting: Physician Assistant

## 2014-07-18 ENCOUNTER — Other Ambulatory Visit: Payer: Self-pay | Admitting: Physician Assistant

## 2014-08-19 ENCOUNTER — Other Ambulatory Visit: Payer: Self-pay | Admitting: Physician Assistant

## 2014-08-24 ENCOUNTER — Ambulatory Visit: Payer: Self-pay | Admitting: Physician Assistant

## 2014-09-03 ENCOUNTER — Encounter: Payer: Self-pay | Admitting: *Deleted

## 2014-09-03 ENCOUNTER — Emergency Department
Admission: EM | Admit: 2014-09-03 | Discharge: 2014-09-03 | Disposition: A | Discharge status: Home / Self Care | Payer: BC Managed Care – PPO | Source: Home / Self Care | Attending: Emergency Medicine | Admitting: Emergency Medicine

## 2014-09-03 DIAGNOSIS — R1013 Epigastric pain: Secondary | ICD-10-CM | POA: Diagnosis not present

## 2014-09-03 LAB — POCT CBC W AUTO DIFF (K'VILLE URGENT CARE)

## 2014-09-03 MED ORDER — ESOMEPRAZOLE MAGNESIUM 40 MG PO CPDR: 40.0000 mg | DELAYED_RELEASE_CAPSULE | Freq: Every day | ORAL | Status: DC

## 2014-09-03 MED ORDER — GI COCKTAIL ~~LOC~~
30.0000 mL | ORAL | Status: AC
  Administered 2014-09-03: 30 mL via ORAL

## 2014-09-03 MED ORDER — DICYCLOMINE HCL 20 MG PO TABS: 20.0000 mg | ORAL_TABLET | Freq: Two times a day (BID) | ORAL | Status: DC

## 2014-09-03 NOTE — ED Notes (Signed)
Robert Frey c/o epigastric pain x yesterday. Denies reflux, N/V/D.

## 2014-09-03 NOTE — ED Provider Notes (Signed)
CSN: [id number]     Arrival date & time 09/03/14  1048 History   First MD Initiated Contact with Patient 09/03/14 1105     Chief Complaint  Patient presents with  . Abdominal Pain    epigastric    HPI 54 year old male. Complains of severe epigastric pain, onset yesterday but worse now. Pain is crampy and sharp 8 out of 10 intensity without radiation. Has decreased appetite, but has tolerated liquids this morning without any nausea or vomiting or diarrhea or change in bowel habits. Denies fever or chills. Denies any new acute arthralgias or myalgias.  No chest pain or any cardiorespiratory symptoms. No shortness of breath. No acute neck pain or ENT symptoms. No focal neurologic symptoms. Denies any urinary symptoms. No dysuria or hematuria or flank pain. He states his son had similar illness last week, ultimately diagnosed as a viral gastritis and son's illness resolved after several days. Patient denies eating any unusual foods recently. He denies any new medications recently. He denies alcohol or drugs. Reviewed his past medical history including ankylosing spondylitis, on hydrocodone chronically 3 times a day by Dr. Kathee Delton his rheumatologist. PCP is Dr. Charise Carwin  He is status post appendectomy many years ago. No history of aneurysms.  Remainder of Review of Systems negative for acute change except as noted in the HPI.   Past Medical History  Diagnosis Date  . COPD (chronic obstructive pulmonary disease)   . Diabetes mellitus without complication   . Ankylosing spondylitis    Past Surgical History  Procedure Laterality Date  . Appendectomy    . Tonsillectomy and adenoidectomy     Family History  Problem Relation Age of Onset  . Alcohol abuse Other   . Depression Other   . Hypertension Other   . Hyperlipidemia Other   . Stroke Other   . Diabetes Mother    History  Substance Use Topics  . Smoking status: Current Some Day Smoker    Last Attempt to Quit: 08/13/2004  .  Smokeless tobacco: Never Used  . Alcohol Use: No    Review of Systems  Allergies  Zithromax  Home Medications   Prior to Admission medications   Medication Sig Start Date End Date Taking? Authorizing Provider  Adalimumab (HUMIRA [place]) Inject into the skin.    Historical Provider, MD  dicyclomine (BENTYL) 20 MG tablet Take 1 tablet (20 mg total) by mouth 2 (two) times daily. For abdominal pain/spasm 09/03/14   Jacqulyn Cane, MD  esomeprazole (NEXIUM) 40 MG capsule Take 1 capsule (40 mg total) by mouth daily. 09/03/14   Jacqulyn Cane, MD  imipramine (TOFRANIL) 50 MG tablet TAKE 4 TABLETS AT BEDTIME *NEED APPOINTMENT 08/19/14   Donella Stade, PA-C  meloxicam (MOBIC) 15 MG tablet TAKE 1 TABLET BY MOUTH EVERY DAY 03/16/14   Jade L Breeback, PA-C  metFORMIN (GLUCOPHAGE) 1000 MG tablet TAKE 1 TABLET TWICE A DAY WITH FOOD 06/01/14   Jade L Breeback, PA-C  methotrexate (RHEUMATREX) 2.5 MG tablet Take 7.5 mg by mouth. Injection directed to draw up .8 mgs by RA physician     Historical Provider, MD  pravastatin (PRAVACHOL) 40 MG tablet TAKE 1 TABLET (40 MG TOTAL) BY MOUTH AT BEDTIME. 04/06/14   Jade L Breeback, PA-C   BP 169/95 mmHg  Pulse 96  Temp(Src) 98.1 F (36.7 C) (Oral)  Resp 16  Wt 224 lb (101.606 kg)  SpO2 99% Physical Exam  Constitutional: He is oriented to person, place, and time. He appears well-developed  and well-nourished. No distress.  HENT:  Right Ear: External ear normal.  Left Ear: External ear normal.  Nose: Nose normal.  Mouth/Throat: Oropharynx is clear and moist. No oropharyngeal exudate.  Eyes: Conjunctivae are normal. Right eye exhibits no discharge. Left eye exhibits no discharge. No scleral icterus.  Neck: Normal range of motion. Neck supple.  Cardiovascular: Normal rate, regular rhythm and normal heart sounds.   Pulmonary/Chest: Breath sounds normal.  Abdominal: Soft. Bowel sounds are normal. He exhibits no abdominal bruit and no pulsatile midline mass. There is no  hepatosplenomegaly. There is tenderness. There is no rigidity, no rebound, no guarding, no CVA tenderness, no tenderness at McBurney's point and negative Murphy's sign. Hernia confirmed negative in the ventral area.  Abdomen is protuberant (he states that's normal for him) but not distended. He is extremely tender epigastric area without any specific masses guarding or rebound  Musculoskeletal: Normal range of motion.  Neurological: He is alert and oriented to person, place, and time. No cranial nerve deficit.  Skin: Skin is warm and dry. No rash noted.  Psychiatric: He has a normal mood and affect.  Nursing note and vitals reviewed.  Additional abdominal exam: No bruits. No pulsations. No masses. Peripheral pulses lower extremities intact bilaterally. BP rechecked 148/92 ED Course  Procedures (including critical care time) Labs Review Labs Reviewed  COMPLETE METABOLIC PANEL WITH GFR - Abnormal; Notable for the following:    Sodium 133 (*)    Glucose, Bld 179 (*)    All other components within normal limits   Narrative:    Performed at:  [address], [address]                [place], [place] [address]  AMYLASE   Narrative:    Performed at:  [address], [address]                [place], [place] [address]  LIPASE   Narrative:    Performed at:  [address], [address]                ,  [id number]  POCT CBC W AUTO DIFF ([place])   Results for orders placed or performed during the hospital encounter of 09/03/14  Amylase  Result Value Ref Range   Amylase 22 0 - 105 U/L  Lipase  Result Value Ref Range   Lipase 15 0 - 75 U/L  COMPLETE METABOLIC PANEL WITH GFR  Result Value Ref Range   Sodium 133 (L) 135 - 145 mEq/L   Potassium 4.4 3.5 - 5.3 mEq/L   Chloride 98 96 - 112 mEq/L   CO2 21 19 - 32 mEq/L   Glucose, Bld 179 (H) 70 - 99 mg/dL   BUN  12 6 - 23 mg/dL   Creat 0.84 0.50 - 1.35 mg/dL   Total Bilirubin 0.4 0.2 - 1.2 mg/dL   Alkaline Phosphatase 62 39 - 117 U/L   AST 34 0 - 37 U/L   ALT 41 0 - 53 U/L   Total Protein 7.1 6.0 - 8.3 g/dL   Albumin 4.3 3.5 - 5.2 g/dL   Calcium 9.0 8.4 - 10.5  mg/dL   GFR, Est African American >89 mL/min   GFR, Est Non African American >89 mL/min  POCT CBC w auto diff ([facility])  Result Value Ref Range   WBC  4.5 - 10.5 K/uL   Lymphocytes relative %  15 - 45 %   Monocytes relative %  2 - 10 %   Neutrophils relative % (GR)  44 - 76 %   Lymphocytes absolute  0.1 - 1.8 K/uL   Monocyes absolute  0.1 - 1 K/uL   Neutrophils absolute (GR#)  1.7 - 7.7 K/uL   RBC  4.2 - 5.8 MIL/uL   Hemoglobin  13 - 17 g/dL   Hematocrit  38.5 - 51 %   MCV  80 - 98 fL   MCH  26.5 - 32.5 pg   MCHC  32.5 - 36.9 g/dL   RDW  11.6 - 14 %   Platelet count  140 - 400 K/uL   MPV  7.8 - 11 fL   CBC within normal limits. WBC 8.3, hemoglobin 15.7. Platelets 265,000  GI cocktail ordered--patient reevaluated and reexamined 30 minutes later, the epigastric pain had decreased to 4 out of 10 intensity and he stated that his pain was significantly improved. Imaging Review No results found. He declined any imaging at this time.  MDM   1. Acute epigastric pain    reviewed above labs including CBC CMP amylase and lipase, all within normal limits. Nonfasting glucose 179.  . 12:01 PM. Patient reevaluated. His epigastric pain is significantly decreased to a 4 out of 10 in intensity. Abdominal exam repeated with only mild epigastric tenderness. No bruits or masses or abnormal pulsations. No evidence of surgical abdomen or acute abdomen. Likely cause is gastritis, especially given that his pain significantly improved after the GI cocktail. Options discussed at length. He declined any other testing. We discussed pros and cons of imaging and he declined any imaging at this time. He declined CT of abdomen at this time. He  declined urinalysis. He has no flank pain or lower abdominal or pelvic pain. He is status post appendectomy in the past. Nexium prescribed.-40 mg daily Bentyl prescribed.-20 mg twice a day Avoid NSAIDs for now. Follow-up with your primary care doctor in 3-5 days , or sooner if symptoms become worse. Precautions discussed. Red flags discussed. If any worsening or persistent symptoms or any red flags, go to emergency room stat. Questions invited and answered. Patient voiced understanding and agreement.   Jacqulyn Cane, MD 09/04/14 [phone]

## 2014-09-04 ENCOUNTER — Telehealth: Payer: Self-pay | Admitting: *Deleted

## 2014-09-04 LAB — COMPLETE METABOLIC PANEL WITH GFR
ALT: 41 U/L (ref 0–53)
AST: 34 U/L (ref 0–37)
Albumin: 4.3 g/dL (ref 3.5–5.2)
Alkaline Phosphatase: 62 U/L (ref 39–117)
BUN: 12 mg/dL (ref 6–23)
CO2: 21 mEq/L (ref 19–32)
Calcium: 9 mg/dL (ref 8.4–10.5)
Chloride: 98 mEq/L (ref 96–112)
Creat: 0.84 mg/dL (ref 0.50–1.35)
GFR, Est African American: >89 mL/min
GFR, Est Non African American: >89 mL/min
Glucose, Bld: 179 mg/dL — ABNORMAL HIGH (ref 70–99)
Potassium: 4.4 mEq/L (ref 3.5–5.3)
Sodium: 133 mEq/L — ABNORMAL LOW (ref 135–145)
Total Bilirubin: 0.4 mg/dL (ref 0.2–1.2)
Total Protein: 7.1 g/dL (ref 6.0–8.3)

## 2014-09-04 LAB — AMYLASE: Amylase: 22 U/L (ref 0–105)

## 2014-09-04 LAB — LIPASE: Lipase: 15 U/L (ref 0–75)

## 2014-09-06 ENCOUNTER — Emergency Department (HOSPITAL_BASED_OUTPATIENT_CLINIC_OR_DEPARTMENT_OTHER)
Admission: EM | Admit: 2014-09-06 | Discharge: 2014-09-07 | Disposition: A | Discharge status: Home / Self Care | Payer: BC Managed Care – PPO | Attending: Emergency Medicine | Admitting: Emergency Medicine

## 2014-09-06 ENCOUNTER — Encounter (HOSPITAL_BASED_OUTPATIENT_CLINIC_OR_DEPARTMENT_OTHER): Payer: Self-pay | Admitting: *Deleted

## 2014-09-06 DIAGNOSIS — R1013 Epigastric pain: Secondary | ICD-10-CM | POA: Diagnosis present

## 2014-09-06 DIAGNOSIS — E119 Type 2 diabetes mellitus without complications: Secondary | ICD-10-CM | POA: Diagnosis not present

## 2014-09-06 DIAGNOSIS — Z79899 Other long term (current) drug therapy: Secondary | ICD-10-CM | POA: Diagnosis not present

## 2014-09-06 DIAGNOSIS — Z87891 Personal history of nicotine dependence: Secondary | ICD-10-CM | POA: Insufficient documentation

## 2014-09-06 DIAGNOSIS — Z8739 Personal history of other diseases of the musculoskeletal system and connective tissue: Secondary | ICD-10-CM | POA: Diagnosis not present

## 2014-09-06 DIAGNOSIS — R109 Unspecified abdominal pain: Secondary | ICD-10-CM

## 2014-09-06 DIAGNOSIS — K7581 Nonalcoholic steatohepatitis (NASH): Secondary | ICD-10-CM

## 2014-09-06 DIAGNOSIS — J449 Chronic obstructive pulmonary disease, unspecified: Secondary | ICD-10-CM | POA: Insufficient documentation

## 2014-09-06 DIAGNOSIS — R911 Solitary pulmonary nodule: Secondary | ICD-10-CM | POA: Diagnosis not present

## 2014-09-06 DIAGNOSIS — R52 Pain, unspecified: Secondary | ICD-10-CM

## 2014-09-06 LAB — URINALYSIS, ROUTINE W REFLEX MICROSCOPIC
Bilirubin Urine: NEGATIVE
Glucose, UA: NEGATIVE mg/dL
HGB URINE DIPSTICK: NEGATIVE
KETONES UR: NEGATIVE mg/dL
Leukocytes, UA: NEGATIVE
NITRITE: NEGATIVE
PH: 5.5 (ref 5.0–8.0)
PROTEIN: NEGATIVE mg/dL
Specific Gravity, Urine: 1.005 (ref 1.005–1.030)
Urobilinogen, UA: 0.2 mg/dL (ref 0.0–1.0)

## 2014-09-06 MED ORDER — ONDANSETRON HCL 4 MG/2ML IJ SOLN
4.0000 mg | Freq: Once | INTRAMUSCULAR | Status: AC
  Administered 2014-09-07: 4 mg via INTRAVENOUS
  Filled 2014-09-06: qty 2

## 2014-09-06 MED ORDER — SODIUM CHLORIDE 0.9 % IV BOLUS (SEPSIS)
1000.0000 mL | Freq: Once | INTRAVENOUS | Status: AC
  Administered 2014-09-07: 1000 mL via INTRAVENOUS

## 2014-09-06 MED ORDER — MORPHINE SULFATE 4 MG/ML IJ SOLN
4.0000 mg | Freq: Once | INTRAMUSCULAR | Status: AC
  Administered 2014-09-07: 4 mg via INTRAVENOUS
  Filled 2014-09-06: qty 1

## 2014-09-06 MED ORDER — FAMOTIDINE IN NACL 20-0.9 MG/50ML-% IV SOLN
20.0000 mg | Freq: Once | INTRAVENOUS | Status: AC
  Administered 2014-09-07: 20 mg via INTRAVENOUS
  Filled 2014-09-06: qty 50

## 2014-09-06 NOTE — ED Notes (Signed)
Upper abd pain x 5 day- no n/v/d- pt states pain is stabbing

## 2014-09-06 NOTE — ED Provider Notes (Signed)
CSN: [id number]     Arrival date & time 09/06/14  2140 History  This chart was scribed for Korinna Tat, MD by Edison Simon, ED Scribe. This patient was seen in room MH05/MH05 and the patient's care was started at 11:39 PM.    Chief Complaint  Patient presents with  . Abdominal Pain   Patient is a 54 y.o. male presenting with abdominal pain. The history is provided by the patient. No language interpreter was used.  Abdominal Pain Pain location:  Epigastric Pain quality comment:  "like hunger" Pain radiates to:  Does not radiate Pain severity:  Severe Onset quality:  Sudden Duration:  1 week Timing:  Intermittent Progression:  Worsening (improved, but then recurred today) Chronicity:  New Context: previous surgery (appendectomy 30 years ago)   Context: not suspicious food intake   Relieved by:  Nothing Worsened by:  Nothing tried Ineffective treatments:  None tried Associated symptoms: diarrhea, nausea and vomiting   Associated symptoms: no chest pain, no constipation, no fever and no shortness of breath   Risk factors: no alcohol abuse     HPI Comments: Robert Frey is a 53 y.o. male who presents to the Emergency Department complaining of epigrastric abdominal pain with onset 5 days ago. He states he was seen at [facility] in [place] 4 days ago and "given something to numb my insides" without further workup but told to return if pain returns. He states pain returned at 1830 today. He describes it as "like a hunger pain but worse." He states he last ate at 1200 and had ham and a bread roll. He states pain did not seem to change with eating. He states he recently started using Bentyl, but denies other recent medication change. He reports 1 count of vomiting and diarrhea 1 week ago before pain began. He states he is passing gas and burping, but not more than usual.  He is a former smoker. He denies alcohol use. He reports appendectomy 30 years ago but denies other abdominal  surgeries. He also reports surgery on cartilage as a child. He denies recent vomiting or diarrhea and denies constipation.  PCP: Iran Planas, PA-C   Past Medical History  Diagnosis Date  . COPD (chronic obstructive pulmonary disease)   . Diabetes mellitus without complication   . Ankylosing spondylitis    Past Surgical History  Procedure Laterality Date  . Appendectomy    . Tonsillectomy and adenoidectomy    . Ent surgery    . Carpal tunnel release Right    Family History  Problem Relation Age of Onset  . Alcohol abuse Other   . Depression Other   . Hypertension Other   . Hyperlipidemia Other   . Stroke Other   . Diabetes Mother    History  Substance Use Topics  . Smoking status: Former Smoker    Quit date: 08/13/2004  . Smokeless tobacco: Never Used  . Alcohol Use: No    Review of Systems  Constitutional: Negative for fever.  Respiratory: Negative for shortness of breath.   Cardiovascular: Negative for chest pain, palpitations and leg swelling.  Gastrointestinal: Positive for nausea, vomiting, abdominal pain and diarrhea. Negative for constipation.  All other systems reviewed and are negative.     Allergies  Zithromax  Home Medications   Prior to Admission medications   Medication Sig Start Date End Date Taking? Authorizing Provider  Adalimumab (HUMIRA [place]) Inject into the skin.   Yes Historical Provider, MD  dicyclomine (BENTYL)  20 MG tablet Take 1 tablet (20 mg total) by mouth 2 (two) times daily. For abdominal pain/spasm 09/03/14  Yes Jacqulyn Cane, MD  esomeprazole (NEXIUM) 40 MG capsule Take 1 capsule (40 mg total) by mouth daily. 09/03/14  Yes Jacqulyn Cane, MD  HYDROcodone-acetaminophen (NORCO/VICODIN) 5-325 MG per tablet Take 1 tablet by mouth 3 (three) times daily.   Yes Historical Provider, MD  imipramine (TOFRANIL) 50 MG tablet TAKE 4 TABLETS AT BEDTIME *NEED APPOINTMENT 08/19/14  Yes Jade L Breeback, PA-C  metFORMIN (GLUCOPHAGE) 1000 MG tablet TAKE 1  TABLET TWICE A DAY WITH FOOD 06/01/14  Yes Jade L Breeback, PA-C  methotrexate (RHEUMATREX) 2.5 MG tablet Take 7.5 mg by mouth. Injection directed to draw up .8 mgs by RA physician    Yes Historical Provider, MD  pravastatin (PRAVACHOL) 40 MG tablet TAKE 1 TABLET (40 MG TOTAL) BY MOUTH AT BEDTIME. 04/06/14  Yes Jade L Breeback, PA-C  traMADol (ULTRAM) 50 MG tablet Take by mouth every 6 (six) hours as needed.   Yes Historical Provider, MD  meloxicam (MOBIC) 15 MG tablet TAKE 1 TABLET BY MOUTH EVERY DAY 03/16/14   Jade L Breeback, PA-C   BP 168/100 mmHg  Pulse 92  Temp(Src) 98.6 F (37 C) (Oral)  Resp 18  Ht 6' (1.829 m)  Wt 224 lb (101.606 kg)  BMI 30.37 kg/m2  SpO2 100% Physical Exam  Constitutional: He is oriented to person, place, and time. He appears well-developed and well-nourished.  HENT:  Head: Normocephalic and atraumatic.  Mouth/Throat: Oropharynx is clear and moist.  Eyes: Conjunctivae and EOM are normal. Pupils are equal, round, and reactive to light.  Neck: Normal range of motion. Neck supple.  Cardiovascular: Normal rate and regular rhythm.   Pulmonary/Chest: Effort normal and breath sounds normal. No respiratory distress. He has no wheezes. He has no rales.  Abdominal: Soft. He exhibits no distension and no mass. There is no tenderness. There is no rigidity, no rebound, no guarding, no tenderness at McBurney's point and negative Murphy's sign.  Hyperactive bowel sounds  Musculoskeletal: Normal range of motion.  Neurological: He is alert and oriented to person, place, and time.  Skin: Skin is warm and dry.  Psychiatric: He has a normal mood and affect.  Nursing note and vitals reviewed.   ED Course  Procedures (including critical care time)  DIAGNOSTIC STUDIES: Oxygen Saturation is 100% on room air, normal by my interpretation.    COORDINATION OF CARE: 11:48 PM Discussed treatment plan with patient at beside, the patient agrees with the plan and has no further  questions at this time.   Labs Review Labs Reviewed  URINALYSIS, ROUTINE W REFLEX MICROSCOPIC    Imaging Review No results found.   EKG Interpretation None      MDM   Final diagnoses:  None  Patient could not take the nexium prescribed earlier in the week due to cost.    No acute findings on CT scan.  Suspect GERD or abdominal cramping due to gas.  Will treat with a short course of pain medication and prilosec daily as well as close follow up with the patient's PMD.  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  Patient reports he knows he has this L lung nodule but will follow up with his PMD.    I personally performed the services described in this documentation, which was scribed in my presence.  The recorded information has been reviewed and is accurate.    Veatrice Kells, MD 09/07/14 1259

## 2014-09-07 ENCOUNTER — Emergency Department (HOSPITAL_BASED_OUTPATIENT_CLINIC_OR_DEPARTMENT_OTHER): Payer: BC Managed Care – PPO

## 2014-09-07 ENCOUNTER — Encounter (HOSPITAL_BASED_OUTPATIENT_CLINIC_OR_DEPARTMENT_OTHER): Payer: Self-pay | Admitting: Emergency Medicine

## 2014-09-07 LAB — COMPREHENSIVE METABOLIC PANEL
ALK PHOS: 63 U/L (ref 39–117)
ALT: 33 U/L (ref 0–53)
ANION GAP: 13 (ref 5–15)
AST: 29 U/L (ref 0–37)
Albumin: 4.5 g/dL (ref 3.5–5.2)
BILIRUBIN TOTAL: 0.6 mg/dL (ref 0.3–1.2)
BUN: 16 mg/dL (ref 6–23)
CO2: 21 mmol/L (ref 19–32)
Calcium: 9.2 mg/dL (ref 8.4–10.5)
Chloride: 98 mmol/L (ref 96–112)
Creatinine, Ser: 0.9 mg/dL (ref 0.50–1.35)
GFR calc Af Amer: >90 mL/min (ref >90)
GFR calc non Af Amer: >90 mL/min (ref >90)
GLUCOSE: 91 mg/dL (ref 70–99)
Potassium: 3.7 mmol/L (ref 3.5–5.1)
Sodium: 132 mmol/L — ABNORMAL LOW (ref 135–145)
Total Protein: 8.1 g/dL (ref 6.0–8.3)

## 2014-09-07 LAB — CBC WITH DIFFERENTIAL/PLATELET
BASOS ABS: 0.1 10*3/uL (ref 0.0–0.1)
BASOS PCT: 0 % (ref 0–1)
Eosinophils Absolute: 0.4 10*3/uL (ref 0.0–0.7)
Eosinophils Relative: 3 % (ref 0–5)
HEMATOCRIT: 46.6 % (ref 39.0–52.0)
HEMOGLOBIN: 16.8 g/dL (ref 13.0–17.0)
Lymphocytes Relative: 32 % (ref 12–46)
Lymphs Abs: 4.4 10*3/uL — ABNORMAL HIGH (ref 0.7–4.0)
MCH: 33.6 pg (ref 26.0–34.0)
MCHC: 36.1 g/dL — ABNORMAL HIGH (ref 30.0–36.0)
MCV: 93.2 fL (ref 78.0–100.0)
MONOS PCT: 11 % (ref 3–12)
Monocytes Absolute: 1.5 10*3/uL — ABNORMAL HIGH (ref 0.1–1.0)
NEUTROS ABS: 7.4 10*3/uL (ref 1.7–7.7)
NEUTROS PCT: 54 % (ref 43–77)
Platelets: 274 10*3/uL (ref 150–400)
RBC: 5 MIL/uL (ref 4.22–5.81)
RDW: 12.7 % (ref 11.5–15.5)
WBC: 13.7 10*3/uL — AB (ref 4.0–10.5)

## 2014-09-07 LAB — LIPASE, BLOOD: Lipase: 24 U/L (ref 11–59)

## 2014-09-07 MED ORDER — OMEPRAZOLE 20 MG PO CPDR: 20.0000 mg | DELAYED_RELEASE_CAPSULE | Freq: Every day | ORAL | Status: DC

## 2014-09-07 MED ORDER — IOHEXOL 300 MG/ML  SOLN
100.0000 mL | Freq: Once | INTRAMUSCULAR | Status: AC | PRN
  Administered 2014-09-07: 100 mL via INTRAVENOUS

## 2014-09-07 MED ORDER — OXYCODONE-ACETAMINOPHEN 5-325 MG PO TABS: 1.0000 | ORAL_TABLET | Freq: Four times a day (QID) | ORAL | Status: DC | PRN

## 2014-09-07 MED ORDER — IOHEXOL 300 MG/ML  SOLN
50.0000 mL | Freq: Once | INTRAMUSCULAR | Status: AC | PRN
  Administered 2014-09-07: 50 mL via ORAL

## 2014-09-16 ENCOUNTER — Encounter: Payer: Self-pay | Admitting: Physician Assistant

## 2014-09-16 ENCOUNTER — Ambulatory Visit (INDEPENDENT_AMBULATORY_CARE_PROVIDER_SITE_OTHER): Payer: BC Managed Care – PPO | Admitting: Physician Assistant

## 2014-09-16 VITALS — BP 125/69 | HR 100 | Ht 72.0 in | Wt 224.0 lb

## 2014-09-16 DIAGNOSIS — E118 Type 2 diabetes mellitus with unspecified complications: Secondary | ICD-10-CM | POA: Diagnosis not present

## 2014-09-16 DIAGNOSIS — E785 Hyperlipidemia, unspecified: Secondary | ICD-10-CM | POA: Diagnosis not present

## 2014-09-16 DIAGNOSIS — Z79899 Other long term (current) drug therapy: Secondary | ICD-10-CM | POA: Diagnosis not present

## 2014-09-16 LAB — POCT GLYCOSYLATED HEMOGLOBIN (HGB A1C): Hemoglobin A1C: 6.7

## 2014-09-16 MED ORDER — METFORMIN HCL 1000 MG PO TABS: 1000.0000 mg | ORAL_TABLET | Freq: Two times a day (BID) | ORAL | Status: DC

## 2014-09-16 MED ORDER — IMIPRAMINE HCL 50 MG PO TABS: ORAL_TABLET | ORAL | Status: DC

## 2014-09-17 ENCOUNTER — Other Ambulatory Visit: Payer: Self-pay | Admitting: Physician Assistant

## 2014-09-17 LAB — COMPLETE METABOLIC PANEL WITH GFR
ALT: 46 U/L (ref 0–53)
AST: 41 U/L — ABNORMAL HIGH (ref 0–37)
Albumin: 4.5 g/dL (ref 3.5–5.2)
Alkaline Phosphatase: 59 U/L (ref 39–117)
BILIRUBIN TOTAL: 0.5 mg/dL (ref 0.2–1.2)
BUN: 19 mg/dL (ref 6–23)
CALCIUM: 9.4 mg/dL (ref 8.4–10.5)
CO2: 24 meq/L (ref 19–32)
CREATININE: 0.9 mg/dL (ref 0.50–1.35)
Chloride: 94 mEq/L — ABNORMAL LOW (ref 96–112)
GFR, Est Non African American: >89 mL/min
Glucose, Bld: 61 mg/dL — ABNORMAL LOW (ref 70–99)
Potassium: 4.3 mEq/L (ref 3.5–5.3)
Sodium: 134 mEq/L — ABNORMAL LOW (ref 135–145)
Total Protein: 7.4 g/dL (ref 6.0–8.3)

## 2014-09-17 LAB — LIPID PANEL
CHOL/HDL RATIO: 5.6 ratio
Cholesterol: 209 mg/dL — ABNORMAL HIGH (ref 0–200)
HDL: 37 mg/dL — AB (ref >=40)
LDL Cholesterol: 117 mg/dL — ABNORMAL HIGH (ref 0–99)
TRIGLYCERIDES: 276 mg/dL — AB (ref <150)
VLDL: 55 mg/dL — AB (ref 0–40)

## 2014-09-17 LAB — HEMOGLOBIN A1C
HEMOGLOBIN A1C: 6.8 % — AB (ref <5.7)
MEAN PLASMA GLUCOSE: 148 mg/dL — AB (ref <117)

## 2014-09-21 NOTE — Progress Notes (Signed)
   Subjective:    Patient ID: Robert Frey, male    DOB: 1960/12/22, 54 y.o.   MRN: [id number]  HPI  Pt presents to the clinic for 3 month DM check up. Does not check glucose. Only on metformin. No problems or concerns. No hypoglycemia.       Review of Systems  All other systems reviewed and are negative.      Objective:   Physical Exam  Constitutional: He is oriented to person, place, and time. He appears well-developed and well-nourished.  HENT:  Head: Normocephalic and atraumatic.  Cardiovascular: Normal rate, regular rhythm and normal heart sounds.   Pulmonary/Chest: Effort normal and breath sounds normal.  Neurological: He is alert and oriented to person, place, and time.  Skin: Skin is dry.  Psychiatric: He has a normal mood and affect. His behavior is normal.          Assessment & Plan:  DM, type II, controlled- .Marland Kitchen Lab Results  Component Value Date   HGBA1C 6.8* 09/16/2014   Continue on metformin.  Continue with diet and exercise.  Ordered CMP.  Follow up in 3 months.   Hyperlipidemia- will recheck and refill accordingly.

## 2014-09-24 ENCOUNTER — Other Ambulatory Visit: Payer: Self-pay | Admitting: Physician Assistant

## 2014-09-24 MED ORDER — ATORVASTATIN CALCIUM 40 MG PO TABS: 40.0000 mg | ORAL_TABLET | Freq: Every day | ORAL | Status: DC

## 2014-10-05 ENCOUNTER — Other Ambulatory Visit: Payer: Self-pay | Admitting: Physician Assistant

## 2014-10-30 ENCOUNTER — Other Ambulatory Visit: Payer: Self-pay | Admitting: Physician Assistant

## 2014-12-12 ENCOUNTER — Emergency Department (HOSPITAL_COMMUNITY)
Admission: EM | Admit: 2014-12-12 | Discharge: 2014-12-12 | Disposition: A | Discharge status: Home / Self Care | Payer: BC Managed Care – PPO | Attending: Emergency Medicine | Admitting: Emergency Medicine

## 2014-12-12 ENCOUNTER — Encounter: Payer: Self-pay | Admitting: Emergency Medicine

## 2014-12-12 ENCOUNTER — Emergency Department
Admission: EM | Admit: 2014-12-12 | Discharge: 2014-12-12 | Disposition: A | Discharge status: Home / Self Care | Payer: BC Managed Care – PPO | Source: Home / Self Care | Attending: Family Medicine | Admitting: Family Medicine

## 2014-12-12 ENCOUNTER — Emergency Department (HOSPITAL_COMMUNITY): Payer: BC Managed Care – PPO

## 2014-12-12 ENCOUNTER — Encounter (HOSPITAL_COMMUNITY): Payer: Self-pay | Admitting: Emergency Medicine

## 2014-12-12 DIAGNOSIS — Y998 Other external cause status: Secondary | ICD-10-CM | POA: Diagnosis not present

## 2014-12-12 DIAGNOSIS — S00531A Contusion of lip, initial encounter: Secondary | ICD-10-CM

## 2014-12-12 DIAGNOSIS — Z79899 Other long term (current) drug therapy: Secondary | ICD-10-CM | POA: Diagnosis not present

## 2014-12-12 DIAGNOSIS — W19XXXA Unspecified fall, initial encounter: Secondary | ICD-10-CM

## 2014-12-12 DIAGNOSIS — Z87891 Personal history of nicotine dependence: Secondary | ICD-10-CM | POA: Insufficient documentation

## 2014-12-12 DIAGNOSIS — Z8739 Personal history of other diseases of the musculoskeletal system and connective tissue: Secondary | ICD-10-CM | POA: Insufficient documentation

## 2014-12-12 DIAGNOSIS — S199XXA Unspecified injury of neck, initial encounter: Secondary | ICD-10-CM | POA: Insufficient documentation

## 2014-12-12 DIAGNOSIS — W108XXA Fall (on) (from) other stairs and steps, initial encounter: Secondary | ICD-10-CM | POA: Insufficient documentation

## 2014-12-12 DIAGNOSIS — Y9289 Other specified places as the place of occurrence of the external cause: Secondary | ICD-10-CM | POA: Diagnosis not present

## 2014-12-12 DIAGNOSIS — E119 Type 2 diabetes mellitus without complications: Secondary | ICD-10-CM | POA: Diagnosis not present

## 2014-12-12 DIAGNOSIS — J449 Chronic obstructive pulmonary disease, unspecified: Secondary | ICD-10-CM | POA: Diagnosis not present

## 2014-12-12 DIAGNOSIS — S0081XA Abrasion of other part of head, initial encounter: Secondary | ICD-10-CM

## 2014-12-12 DIAGNOSIS — S134XXA Sprain of ligaments of cervical spine, initial encounter: Secondary | ICD-10-CM | POA: Diagnosis not present

## 2014-12-12 DIAGNOSIS — S139XXA Sprain of joints and ligaments of unspecified parts of neck, initial encounter: Secondary | ICD-10-CM

## 2014-12-12 DIAGNOSIS — S0990XA Unspecified injury of head, initial encounter: Secondary | ICD-10-CM

## 2014-12-12 DIAGNOSIS — Y9301 Activity, walking, marching and hiking: Secondary | ICD-10-CM | POA: Insufficient documentation

## 2014-12-12 MED ORDER — OXYCODONE-ACETAMINOPHEN 5-325 MG PO TABS
1.0000 | ORAL_TABLET | Freq: Once | ORAL | Status: AC
  Administered 2014-12-12: 1 via ORAL
  Filled 2014-12-12: qty 1

## 2014-12-12 NOTE — ED Notes (Signed)
Patient fell face first onto concrete floor at 0400 today; has abrasions on left forehead, nose, edematous lips, both elbows and right knee; states head aches and now neck is hurting.

## 2014-12-12 NOTE — ED Notes (Signed)
Pt sent from urgent care for head CT after falling down steps this morning. Pt reports that he went down one step but missed 3-4 steps after. Pt denies LOC.

## 2014-12-12 NOTE — ED Provider Notes (Signed)
CSN: [id number]     Arrival date & time 12/12/14  1058 History   First MD Initiated Contact with Patient 12/12/14 1101     Chief Complaint  Patient presents with  . Fall  . Head Injury  . Head Laceration  . Abrasion     (Consider location/radiation/quality/duration/timing/severity/associated sxs/prior Treatment) Patient is a 54 y.o. male presenting with fall, head injury, and scalp laceration. The history is provided by the patient. No language interpreter was used.  Fall This is a new problem. The current episode started today. Associated symptoms include headaches and neck pain. Pertinent negatives include no abdominal pain, chest pain, chills or fever. Associated symptoms comments: Fall while walking down steps carrying household garbage to the outdoor can. He hit his head causing facial laceration and neck pain. Fall occurred around 4:30 am this morning. No LOC. He has had no vomiting. No other injury. He was seen at the Urgent Care and referred to the ED for a recommended head and neck CT. Marland Kitchen  Head Injury Associated symptoms: headache and neck pain   Head Laceration Associated symptoms include headaches and neck pain. Pertinent negatives include no abdominal pain, chest pain, chills or fever.    Past Medical History  Diagnosis Date  . COPD (chronic obstructive pulmonary disease)   . Diabetes mellitus without complication   . Ankylosing spondylitis    Past Surgical History  Procedure Laterality Date  . Appendectomy    . Tonsillectomy and adenoidectomy    . Ent surgery    . Carpal tunnel release Right    Family History  Problem Relation Age of Onset  . Alcohol abuse Other   . Depression Other   . Hypertension Other   . Hyperlipidemia Other   . Stroke Other   . Diabetes Mother    History  Substance Use Topics  . Smoking status: Former Smoker    Quit date: 08/13/2004  . Smokeless tobacco: Never Used  . Alcohol Use: No    Review of Systems  Constitutional: Negative  for fever and chills.  HENT: Positive for facial swelling.   Eyes: Negative for visual disturbance.  Respiratory: Negative.  Negative for shortness of breath.   Cardiovascular: Negative.  Negative for chest pain.  Gastrointestinal: Negative.  Negative for abdominal pain.  Musculoskeletal: Positive for neck pain. Negative for back pain.  Skin: Positive for wound.  Neurological: Positive for headaches.      Allergies  Zithromax  Home Medications   Prior to Admission medications   Medication Sig Start Date End Date Taking? Authorizing Provider  Adalimumab (HUMIRA Nowthen) Inject into the skin.    Historical Provider, MD  atorvastatin (LIPITOR) 40 MG tablet Take 1 tablet (40 mg total) by mouth daily. 09/24/14   Jade L Breeback, PA-C  HYDROcodone-acetaminophen (NORCO/VICODIN) 5-325 MG per tablet Take 1 tablet by mouth 3 (three) times daily.    Historical Provider, MD  imipramine (TOFRANIL) 50 MG tablet TAKE 4 TABLETS AT BEDTIME 09/16/14   Jade L Breeback, PA-C  metFORMIN (GLUCOPHAGE) 1000 MG tablet Take 1 tablet (1,000 mg total) by mouth 2 (two) times daily with a meal. 09/16/14   Jade L Breeback, PA-C  methotrexate (RHEUMATREX) 2.5 MG tablet Take 7.5 mg by mouth. Injection directed to draw up .8 mgs by RA physician     Historical Provider, MD  traMADol (ULTRAM) 50 MG tablet Take by mouth every 6 (six) hours as needed.    Historical Provider, MD   BP 142/85 mmHg  Pulse  85  Temp(Src) 98.5 F (36.9 C) (Oral)  Resp 18  Ht 6' (1.829 m)  Wt 220 lb (99.791 kg)  BMI 29.83 kg/m2  SpO2 98% Physical Exam  Constitutional: He is oriented to person, place, and time. He appears well-developed and well-nourished.  HENT:  Head: Normocephalic and atraumatic.  Eyes: EOM are normal. Pupils are equal, round, and reactive to light.  Neck: Normal range of motion.  Cardiovascular: Normal rate and regular rhythm.   No murmur heard. Pulmonary/Chest: Effort normal and breath sounds normal. He has no wheezes.  He has no rales.  Abdominal: Soft. There is no tenderness.  Neurological: He is alert and oriented to person, place, and time. He has normal strength and normal reflexes. No sensory deficit. He displays a negative Romberg sign.    ED Course  Procedures (including critical care time) Labs Review Labs Reviewed - No data to display  Imaging Review No results found.   EKG Interpretation None     Ct Head Wo Contrast  12/12/2014   CLINICAL DATA:  Fall.  Hit forehead on concrete  EXAM: CT HEAD WITHOUT CONTRAST  CT CERVICAL SPINE WITHOUT CONTRAST  TECHNIQUE: Multidetector CT imaging of the head and cervical spine was performed following the standard protocol without intravenous contrast. Multiplanar CT image reconstructions of the cervical spine were also generated.  COMPARISON:  None.  FINDINGS: CT HEAD FINDINGS  No acute cortical infarct, hemorrhage, or mass lesion ispresent. Ventricles are of normal size. No significant extra-axial fluid collection is present. There is partial opacification of the ethmoid air cells and frontal sinus. The mastoid air cells are clear. Left frontal scalp soft tissue swelling noted. No underlying skull fracture. The osseous skull is intact.  CT CERVICAL SPINE FINDINGS  There is straightening of normal cervical lordosis. The vertebral body heights are well preserved. There is mild multi level disc space narrowing and ventral endplate spurring. The facet joints are all well aligned.  IMPRESSION: 1. No acute intracranial abnormalities. 2. No evidence for cervical spine fracture or dislocation. 3. Cervical degenerative disc disease. 4. Ethmoid air cell and frontal sinus disease.   Electronically Signed   By: Kerby Moors M.D.   On: 12/12/2014 12:29   Ct Cervical Spine Wo Contrast  12/12/2014   CLINICAL DATA:  Fall.  Hit forehead on concrete  EXAM: CT HEAD WITHOUT CONTRAST  CT CERVICAL SPINE WITHOUT CONTRAST  TECHNIQUE: Multidetector CT imaging of the head and cervical  spine was performed following the standard protocol without intravenous contrast. Multiplanar CT image reconstructions of the cervical spine were also generated.  COMPARISON:  None.  FINDINGS: CT HEAD FINDINGS  No acute cortical infarct, hemorrhage, or mass lesion ispresent. Ventricles are of normal size. No significant extra-axial fluid collection is present. There is partial opacification of the ethmoid air cells and frontal sinus. The mastoid air cells are clear. Left frontal scalp soft tissue swelling noted. No underlying skull fracture. The osseous skull is intact.  CT CERVICAL SPINE FINDINGS  There is straightening of normal cervical lordosis. The vertebral body heights are well preserved. There is mild multi level disc space narrowing and ventral endplate spurring. The facet joints are all well aligned.  IMPRESSION: 1. No acute intracranial abnormalities. 2. No evidence for cervical spine fracture or dislocation. 3. Cervical degenerative disc disease. 4. Ethmoid air cell and frontal sinus disease.   Electronically Signed   By: Kerby Moors M.D.   On: 12/12/2014 12:29    MDM   Final diagnoses:  None    1. Fall 2. Facial abrasion  The patient remains alert and oriented. Head and neck CT's negative for abnormality or concerning finding. Wounds cleaned and bandaged. He is current on tetanus. He is felt appropriate for discharge.     Charlann Lange, PA-C 12/12/14 [place], MD 12/13/14 [phone]

## 2014-12-12 NOTE — ED Notes (Signed)
Pt undressed, in gown, on continuous pulse oximetry and blood pressure cuff; visitors at bedside 

## 2014-12-12 NOTE — Discharge Instructions (Signed)
Abrasion An abrasion is a cut or scrape of the skin. Abrasions do not extend through all layers of the skin and most heal within 10 days. It is important to care for your abrasion properly to prevent infection. CAUSES  Most abrasions are caused by falling on, or gliding across, the ground or other surface. When your skin rubs on something, the outer and inner layer of skin rubs off, causing an abrasion. DIAGNOSIS  Your caregiver will be able to diagnose an abrasion during a physical exam.  TREATMENT  Your treatment depends on how large and deep the abrasion is. Generally, your abrasion will be cleaned with water and a mild soap to remove any dirt or debris. An antibiotic ointment may be put over the abrasion to prevent an infection. A bandage (dressing) may be wrapped around the abrasion to keep it from getting dirty.  You may need a tetanus shot if:  You cannot remember when you had your last tetanus shot.  You have never had a tetanus shot.  The injury broke your skin. If you get a tetanus shot, your arm may swell, get red, and feel warm to the touch. This is common and not a problem. If you need a tetanus shot and you choose not to have one, there is a rare chance of getting tetanus. Sickness from tetanus can be serious.  HOME CARE INSTRUCTIONS   If a dressing was applied, change it at least once a day or as directed by your caregiver. If the bandage sticks, soak it off with warm water.   Wash the area with water and a mild soap to remove all the ointment 2 times a day. Rinse off the soap and pat the area dry with a clean towel.   Reapply any ointment as directed by your caregiver. This will help prevent infection and keep the bandage from sticking. Use gauze over the wound and under the dressing to help keep the bandage from sticking.   Change your dressing right away if it becomes wet or dirty.   Only take over-the-counter or prescription medicines for pain, discomfort, or fever as  directed by your caregiver.   Follow up with your caregiver within 24-48 hours for a wound check, or as directed. If you were not given a wound-check appointment, look closely at your abrasion for redness, swelling, or pus. These are signs of infection. SEEK IMMEDIATE MEDICAL CARE IF:   You have increasing pain in the wound.   You have redness, swelling, or tenderness around the wound.   You have pus coming from the wound.   You have a fever or persistent symptoms for more than 2-3 days.  You have a fever and your symptoms suddenly get worse.  You have a bad smell coming from the wound or dressing.  MAKE SURE YOU:   Understand these instructions.  Will watch your condition.  Will get help right away if you are not doing well or get worse. Document Released: 02/22/2005 Document Revised: 05/01/2012 Document Reviewed: 04/18/2011 [facility] Patient Information 2015 [place], [place]. This information is not intended to replace advice given to you by your health care provider. Make sure you discuss any questions you have with your health care provider.  Fall Prevention and Home Safety Falls cause injuries and can affect all age groups. It is possible to use preventive measures to significantly decrease the likelihood of falls. There are many simple measures which can make your home safer and prevent falls. OUTDOORS Repair cracks  and edges of walkways and driveways. Remove high doorway thresholds. Trim shrubbery on the main path into your home. Have good outside lighting. Clear walkways of tools, rocks, debris, and clutter. Check that handrails are not broken and are securely fastened. Both sides of steps should have handrails. Have leaves, snow, and ice cleared regularly. Use sand or salt on walkways during winter months. In the garage, clean up grease or oil spills. BATHROOM Install night lights. Install grab bars by the toilet and in the tub and shower. Use non-skid mats or  decals in the tub or shower. Place a plastic non-slip stool in the shower to sit on, if needed. Keep floors dry and clean up all water on the floor immediately. Remove soap buildup in the tub or shower on a regular basis. Secure bath mats with non-slip, double-sided rug tape. Remove throw rugs and tripping hazards from the floors. BEDROOMS Install night lights. Make sure a bedside light is easy to reach. Do not use oversized bedding. Keep a telephone by your bedside. Have a firm chair with side arms to use for getting dressed. Remove throw rugs and tripping hazards from the floor. KITCHEN Keep handles on pots and pans turned toward the center of the stove. Use back burners when possible. Clean up spills quickly and allow time for drying. Avoid walking on wet floors. Avoid hot utensils and knives. Position shelves so they are not too high or low. Place commonly used objects within easy reach. If necessary, use a sturdy step stool with a grab bar when reaching. Keep electrical cables out of the way. Do not use floor polish or wax that makes floors slippery. If you must use wax, use non-skid floor wax. Remove throw rugs and tripping hazards from the floor. STAIRWAYS Never leave objects on stairs. Place handrails on both sides of stairways and use them. Fix any loose handrails. Make sure handrails on both sides of the stairways are as long as the stairs. Check carpeting to make sure it is firmly attached along stairs. Make repairs to worn or loose carpet promptly. Avoid placing throw rugs at the top or bottom of stairways, or properly secure the rug with carpet tape to prevent slippage. Get rid of throw rugs, if possible. Have an electrician put in a light switch at the top and bottom of the stairs. OTHER FALL PREVENTION TIPS Wear low-heel or rubber-soled shoes that are supportive and fit well. Wear closed toe shoes. When using a stepladder, make sure it is fully opened and both spreaders  are firmly locked. Do not climb a closed stepladder. Add color or contrast paint or tape to grab bars and handrails in your home. Place contrasting color strips on first and last steps. Learn and use mobility aids as needed. Install an electrical emergency response system. Turn on lights to avoid dark areas. Replace light bulbs that burn out immediately. Get light switches that glow. Arrange furniture to create clear pathways. Keep furniture in the same place. Firmly attach carpet with non-skid or double-sided tape. Eliminate uneven floor surfaces. Select a carpet pattern that does not visually hide the edge of steps. Be aware of all pets. OTHER HOME SAFETY TIPS Set the water temperature for 120 F (48.8 C). Keep emergency numbers on or near the telephone. Keep smoke detectors on every level of the home and near sleeping areas. Document Released: 05/05/2002 Document Revised: 11/14/2011 Document Reviewed: 08/04/2011 [facility] Patient Information 2015 [place], [place]. This information is not intended to replace advice given  to you by your health care provider. Make sure you discuss any questions you have with your health care provider. Cryotherapy Cryotherapy means treatment with cold. Ice or gel packs can be used to reduce both pain and swelling. Ice is the most helpful within the first 24 to 48 hours after an injury or flare-up from overusing a muscle or joint. Sprains, strains, spasms, burning pain, shooting pain, and aches can all be eased with ice. Ice can also be used when recovering from surgery. Ice is effective, has very few side effects, and is safe for most people to use. PRECAUTIONS  Ice is not a safe treatment option for people with: Raynaud phenomenon. This is a condition affecting small blood vessels in the extremities. Exposure to cold may cause your problems to return. Cold hypersensitivity. There are many forms of cold hypersensitivity, including: Cold urticaria. Red, itchy hives  appear on the skin when the tissues begin to warm after being iced. Cold erythema. This is a red, itchy rash caused by exposure to cold. Cold hemoglobinuria. Red blood cells break down when the tissues begin to warm after being iced. The hemoglobin that carry oxygen are passed into the urine because they cannot combine with blood proteins fast enough. Numbness or altered sensitivity in the area being iced. If you have any of the following conditions, do not use ice until you have discussed cryotherapy with your caregiver: Heart conditions, such as arrhythmia, angina, or chronic heart disease. High blood pressure. Healing wounds or open skin in the area being iced. Current infections. Rheumatoid arthritis. Poor circulation. Diabetes. Ice slows the blood flow in the region it is applied. This is beneficial when trying to stop inflamed tissues from spreading irritating chemicals to surrounding tissues. However, if you expose your skin to cold temperatures for too long or without the proper protection, you can damage your skin or nerves. Watch for signs of skin damage due to cold. HOME CARE INSTRUCTIONS Follow these tips to use ice and cold packs safely. Place a dry or damp towel between the ice and skin. A damp towel will cool the skin more quickly, so you may need to shorten the time that the ice is used. For a more rapid response, add gentle compression to the ice. Ice for no more than 10 to 20 minutes at a time. The bonier the area you are icing, the less time it will take to get the benefits of ice. Check your skin after 5 minutes to make sure there are no signs of a poor response to cold or skin damage. Rest 20 minutes or more between uses. Once your skin is numb, you can end your treatment. You can test numbness by very lightly touching your skin. The touch should be so light that you do not see the skin dimple from the pressure of your fingertip. When using ice, most people will feel these  normal sensations in this order: cold, burning, aching, and numbness. Do not use ice on someone who cannot communicate their responses to pain, such as small children or people with dementia. HOW TO MAKE AN ICE PACK Ice packs are the most common way to use ice therapy. Other methods include ice massage, ice baths, and cryosprays. Muscle creams that cause a cold, tingly feeling do not offer the same benefits that ice offers and should not be used as a substitute unless recommended by your caregiver. To make an ice pack, do one of the following: Place crushed ice or a  bag of frozen vegetables in a sealable plastic bag. Squeeze out the excess air. Place this bag inside another plastic bag. Slide the bag into a pillowcase or place a damp towel between your skin and the bag. Mix 3 parts water with 1 part rubbing alcohol. Freeze the mixture in a sealable plastic bag. When you remove the mixture from the freezer, it will be slushy. Squeeze out the excess air. Place this bag inside another plastic bag. Slide the bag into a pillowcase or place a damp towel between your skin and the bag. SEEK MEDICAL CARE IF: You develop white spots on your skin. This may give the skin a blotchy (mottled) appearance. Your skin turns blue or pale. Your skin becomes waxy or hard. Your swelling gets worse. MAKE SURE YOU:  Understand these instructions. Will watch your condition. Will get help right away if you are not doing well or get worse. Document Released: 01/09/2011 Document Revised: 09/29/2013 Document Reviewed: 01/09/2011 [facility] Patient Information 2015 [place], [place]. This information is not intended to replace advice given to you by your health care provider. Make sure you discuss any questions you have with your health care provider.

## 2014-12-12 NOTE — ED Provider Notes (Signed)
CSN: [id number]     Arrival date & time 12/12/14  0909 History   First MD Initiated Contact with Patient 12/12/14 0935     Chief Complaint  Patient presents with  . Abrasion      HPI Comments: Patient was walking down a flight of four stairs at 4am today when he missed a step and fell directly forward, striking his forehead on a concrete walkway.  He states that his head rebounded backwards, resulting in hyperextension of his neck.  He complains of left sided headache, abrasion on his forehead, and swelling in his left lower lip.  He had a minimal amount of epistaxis.  No loss of consciousness.  No nausea/vomiting.  No localizing neurologic symptoms.    Patient is a 54 y.o. male presenting with head injury. The history is provided by the patient and the spouse.  Head Injury Location:  Frontal Time since incident:  5 hours Mechanism of injury: fall   Pain details:    Quality:  Aching   Radiates to: neck.   Severity:  Moderate   Timing:  Constant   Progression:  Unchanged Chronicity:  New Relieved by:  None tried Exacerbated by: neck movement. Ineffective treatments:  None tried Associated symptoms: headache and neck pain   Associated symptoms: no blurred vision, no difficulty breathing, no disorientation, no double vision, no focal weakness, no hearing loss, no loss of consciousness, no memory loss, no nausea, no numbness, no seizures, no tinnitus and no vomiting     Past Medical History  Diagnosis Date  . COPD (chronic obstructive pulmonary disease)   . Diabetes mellitus without complication   . Ankylosing spondylitis    Past Surgical History  Procedure Laterality Date  . Appendectomy    . Tonsillectomy and adenoidectomy    . Ent surgery    . Carpal tunnel release Right    Family History  Problem Relation Age of Onset  . Alcohol abuse Other   . Depression Other   . Hypertension Other   . Hyperlipidemia Other   . Stroke Other   . Diabetes Mother    History  Substance  Use Topics  . Smoking status: Former Smoker    Quit date: 08/13/2004  . Smokeless tobacco: Never Used  . Alcohol Use: No    Review of Systems  HENT: Negative for hearing loss and tinnitus.   Eyes: Negative for blurred vision and double vision.  Gastrointestinal: Negative for nausea and vomiting.  Musculoskeletal: Positive for neck pain.  Neurological: Positive for headaches. Negative for focal weakness, seizures, loss of consciousness and numbness.  Psychiatric/Behavioral: Negative for memory loss.  All other systems reviewed and are negative.   Allergies  Green dyes and Zithromax  Home Medications   Prior to Admission medications   Medication Sig Start Date End Date Taking? Authorizing Provider  atorvastatin (LIPITOR) 40 MG tablet Take 1 tablet (40 mg total) by mouth daily. 09/24/14   Jade L Breeback, PA-C  clobetasol cream (TEMOVATE) 4.09 % Apply 1 application topically as needed (for itch).    Historical Provider, MD  HYDROcodone-acetaminophen (NORCO/VICODIN) 5-325 MG per tablet Take 1 tablet by mouth 3 (three) times daily.    Historical Provider, MD  imipramine (TOFRANIL) 50 MG tablet TAKE 4 TABLETS AT BEDTIME Patient taking differently: Take 200 mg by mouth at bedtime.  09/16/14   Jade L Breeback, PA-C  metFORMIN (GLUCOPHAGE) 1000 MG tablet Take 1 tablet (1,000 mg total) by mouth 2 (two) times daily with a meal.  09/16/14   Jade L Breeback, PA-C  methotrexate (50 MG/ML) 1 G injection Inject 8 mg into the vein once a week.    Historical Provider, MD  methotrexate (RHEUMATREX) 2.5 MG tablet Take 7.5 mg by mouth. Injection directed to draw up .8 mgs by RA physician     Historical Provider, MD  traMADol (ULTRAM) 50 MG tablet Take 50-100 mg by mouth 2 (two) times daily.     Historical Provider, MD   BP 138/87 mmHg  Pulse 79  Resp 18  SpO2 97% Physical Exam  Constitutional: He is oriented to person, place, and time. He appears well-developed and well-nourished. No distress.  HENT:    Head:    Right Ear: Tympanic membrane, external ear and ear canal normal.  Left Ear: Tympanic membrane, external ear and ear canal normal.  Nose: Nose normal.  Mouth/Throat:    There is a superficial abrasion of the left forehead,  as noted on diagram, with surrounding tenderness to palpation but no suggestion of depressed skull fracture. The left lower lip is diffusely swollen and tender to palpation, but no laceration present.  No oral lesions/injury noted. Nose reveals no epistaxis      Eyes: Conjunctivae and EOM are normal. Pupils are equal, round, and reactive to light.  Neck: Decreased range of motion present.    Neck reveals diffuse bilateral tenderness to palpation as noted on diagram.    Cardiovascular: Normal heart sounds.   Pulmonary/Chest: Breath sounds normal.  Abdominal: There is no tenderness.  Neurological: He is alert and oriented to person, place, and time. He has normal reflexes. No cranial nerve deficit. Coordination normal.  Skin: Skin is warm and dry. He is not diaphoretic.  Psychiatric: His behavior is normal.  Nursing note and vitals reviewed.   ED Course  Procedures  none   MDM   1. Head injury, initial encounter   2. Abrasion of forehead, initial encounter   3. Contusion, lip, initial encounter   4. Cervical sprain, initial encounter    Although patient has normal neurologic exam, the mechanism of his injury is of concern, involving direct trauma to his forehead and sudden neck flexion.  Recommend that he proceed to [facility] ED for further evaluation    Kandra Nicolas, MD 12/12/14 1217

## 2015-01-18 ENCOUNTER — Encounter: Payer: Self-pay | Admitting: Emergency Medicine

## 2015-01-18 ENCOUNTER — Emergency Department
Admission: EM | Admit: 2015-01-18 | Discharge: 2015-01-18 | Disposition: A | Discharge status: Home / Self Care | Payer: BC Managed Care – PPO | Source: Home / Self Care | Attending: Emergency Medicine | Admitting: Emergency Medicine

## 2015-01-18 DIAGNOSIS — J0120 Acute ethmoidal sinusitis, unspecified: Secondary | ICD-10-CM

## 2015-01-18 MED ORDER — LEVOFLOXACIN 500 MG PO TABS: 500.0000 mg | ORAL_TABLET | Freq: Every day | ORAL | Status: DC

## 2015-01-18 MED ORDER — HYDROCOD POLST-CPM POLST ER 10-8 MG/5ML PO SUER
5.0000 mL | Freq: Two times a day (BID) | ORAL | Status: DC
Start: 2015-01-18 — End: 2015-01-22

## 2015-01-18 NOTE — ED Notes (Signed)
Pt c/o sinus infection, facial pain x 3 days, HA, neck pain.

## 2015-01-18 NOTE — ED Provider Notes (Signed)
CSN: [id number]     Arrival date & time 01/18/15  34 History   First MD Initiated Contact with Patient 01/18/15 1821     Chief Complaint  Patient presents with  . Recurrent Sinusitis   (Consider location/radiation/quality/duration/timing/severity/associated sxs/prior Treatment) Patient is a 54 y.o. male presenting with cough. The history is provided by the patient. No language interpreter was used.  Cough Cough characteristics:  Non-productive Severity:  Moderate Onset quality:  Gradual Duration:  3 days Timing:  Constant Progression:  Worsening Chronicity:  New Smoker: no   Context: not fumes   Relieved by:  Nothing Worsened by:  Nothing tried Ineffective treatments:  None tried Associated symptoms: rhinorrhea, sinus congestion and sore throat   Associated symptoms: no chest pain   Risk factors: no recent infection   Pt is on methotrexate and humira.  Pt has recurring sinus infections.    Past Medical History  Diagnosis Date  . COPD (chronic obstructive pulmonary disease)   . Diabetes mellitus without complication   . Ankylosing spondylitis    Past Surgical History  Procedure Laterality Date  . Appendectomy    . Tonsillectomy and adenoidectomy    . Ent surgery    . Carpal tunnel release Right    Family History  Problem Relation Age of Onset  . Alcohol abuse Other   . Depression Other   . Hypertension Other   . Hyperlipidemia Other   . Stroke Other   . Diabetes Mother    Social History  Substance Use Topics  . Smoking status: Former Smoker    Quit date: 08/13/2004  . Smokeless tobacco: Never Used  . Alcohol Use: No    Review of Systems  HENT: Positive for rhinorrhea and sore throat.   Respiratory: Positive for cough.   Cardiovascular: Negative for chest pain.  All other systems reviewed and are negative.   Allergies  Green dyes and Zithromax  Home Medications   Prior to Admission medications   Medication Sig Start Date End Date Taking?  Authorizing Provider  Adalimumab (HUMIRA [place]) Inject into the skin.   Yes Historical Provider, MD  IMIPRAMINE HCL PO Take by mouth.   Yes Historical Provider, MD  chlorpheniramine-HYDROcodone (TUSSIONEX PENNKINETIC ER) 10-8 MG/5ML SUER Take 5 mLs by mouth 2 (two) times daily. 01/18/15   Fransico Meadow, PA-C  levofloxacin (LEVAQUIN) 500 MG tablet Take 1 tablet (500 mg total) by mouth daily. 01/18/15   Fransico Meadow, PA-C  metFORMIN (GLUCOPHAGE) 1000 MG tablet Take 1 tablet (1,000 mg total) by mouth 2 (two) times daily with a meal. 09/16/14   Jade L Breeback, PA-C  methotrexate (RHEUMATREX) 2.5 MG tablet Take 7.5 mg by mouth. Injection directed to draw up .8 mgs by RA physician     Historical Provider, MD   BP 125/85 mmHg  Pulse 120  Temp(Src) 98.7 F (37.1 C) (Oral)  Ht 5\' 11"  (1.803 m)  Wt 223 lb (101.152 kg)  BMI 31.12 kg/m2  SpO2 97% Physical Exam  Constitutional: He is oriented to person, place, and time. He appears well-developed and well-nourished.  HENT:  Head: Normocephalic.  Right Ear: External ear normal.  Left Ear: External ear normal.  Nose: Nose normal.  Mouth/Throat: Oropharynx is clear and moist.  Eyes: EOM are normal. Pupils are equal, round, and reactive to light.  Neck: Normal range of motion.  Cardiovascular: Normal rate and normal heart sounds.   Pulmonary/Chest: Effort normal.  Abdominal: Soft. He exhibits no distension.  Musculoskeletal: Normal range  of motion.  Neurological: He is alert and oriented to person, place, and time.  Skin: Skin is warm.  Psychiatric: He has a normal mood and affect.  Nursing note and vitals reviewed.   ED Course  Procedures (including critical care time) Labs Review Labs Reviewed - No data to display  Imaging Review No results found.   MDM   1. Acute ethmoidal sinusitis, recurrence not specified    Tussionex levaquin Follow up at [facility] for recheck.     Hollace Kinnier [place], PA-C 01/18/15 1858

## 2015-01-18 NOTE — Discharge Instructions (Signed)
Sinusitis °Sinusitis is redness, soreness, and puffiness (inflammation) of the air pockets in the bones of your face (sinuses). The redness, soreness, and puffiness can cause air and mucus to get trapped in your sinuses. This can allow germs to grow and cause an infection.  °HOME CARE  °· Drink enough fluids to keep your pee (urine) clear or pale yellow. °· Use a humidifier in your home. °· Run a hot shower to create steam in the bathroom. Sit in the bathroom with the door closed. Breathe in the steam 3-4 times a day. °· Put a warm, moist washcloth on your face 3-4 times a day, or as told by your doctor. °· Use salt water sprays (saline sprays) to wet the thick fluid in your nose. This can help the sinuses drain. °· Only take medicine as told by your doctor. °GET HELP RIGHT AWAY IF:  °· Your pain gets worse. °· You have very bad headaches. °· You are sick to your stomach (nauseous). °· You throw up (vomit). °· You are very sleepy (drowsy) all the time. °· Your face is puffy (swollen). °· Your vision changes. °· You have a stiff neck. °· You have trouble breathing. °MAKE SURE YOU:  °· Understand these instructions. °· Will watch your condition. °· Will get help right away if you are not doing well or get worse. °Document Released: 11/01/2007 Document Revised: 02/07/2012 Document Reviewed: 12/19/2011 °ExitCare® Patient Information ©2015 [employer]. This information is not intended to replace advice given to you by your health care provider. Make sure you discuss any questions you have with your health care provider. ° °

## 2015-01-20 ENCOUNTER — Encounter: Payer: Self-pay | Admitting: Physician Assistant

## 2015-01-20 ENCOUNTER — Ambulatory Visit (INDEPENDENT_AMBULATORY_CARE_PROVIDER_SITE_OTHER): Payer: BC Managed Care – PPO | Admitting: Physician Assistant

## 2015-01-20 VITALS — BP 126/83 | HR 99 | Temp 98.0°F | Ht 71.0 in | Wt 220.0 lb

## 2015-01-20 DIAGNOSIS — J0120 Acute ethmoidal sinusitis, unspecified: Secondary | ICD-10-CM

## 2015-01-20 DIAGNOSIS — R112 Nausea with vomiting, unspecified: Secondary | ICD-10-CM | POA: Diagnosis not present

## 2015-01-20 MED ORDER — PREDNISONE 20 MG PO TABS: ORAL_TABLET | ORAL | Status: DC

## 2015-01-20 MED ORDER — ONDANSETRON HCL 8 MG PO TABS: 8.0000 mg | ORAL_TABLET | Freq: Three times a day (TID) | ORAL | Status: DC | PRN

## 2015-01-20 NOTE — Progress Notes (Signed)
   Subjective:    Patient ID: Robert Frey, male    DOB: 11/05/1960, 54 y.o.   MRN: [id number]  HPI Pt presents to the clinic for follow up from sinusitis dx on 01/18/15 from UC. He was given levaquin and tussinonex for cough. He vomited first dose of abx but has been able to tolerate 2nd dose. He does continue to feel a little nauseated. He continues to have sinus pressure maxillary and behind eyes, bilateral ear pain, cough, neck soreness, nasal congestion. No fever or chills.   Review of Systems  All other systems reviewed and are negative.      Objective:   Physical Exam  Constitutional: He is oriented to person, place, and time. He appears well-developed and well-nourished.  HENT:  Head: Normocephalic and atraumatic.  TM's erythematous bilaterally with no blood or pus.  Bilateral maxillary sinus tenderness to palpation.  Bilateral nasal turbinates red and swollen.   Eyes: Conjunctivae are normal. Right eye exhibits no discharge. Left eye exhibits no discharge.  Neck: Normal range of motion. Neck supple.  Right sided cervical lymphadenopathy that is tender to palpation.   Cardiovascular: Normal rate, regular rhythm and normal heart sounds.   Pulmonary/Chest: Effort normal and breath sounds normal. He has no wheezes.  Neurological: He is alert and oriented to person, place, and time.  Skin: Skin is dry.  Psychiatric: He has a normal mood and affect. His behavior is normal.          Assessment & Plan:  Ethmoid sinusitis- continue levaquin. If having any more nausea or vomiting will try switching abx. Solumedrol 125mg  IM given in office today. Prednisone for 5 days to start tomorrow. Written out of work today and tomorrow. Call if not improving in next couple of days.   Nausea- zofran given.

## 2015-01-21 ENCOUNTER — Telehealth: Payer: Self-pay | Admitting: Physician Assistant

## 2015-01-21 MED ORDER — METHYLPREDNISOLONE SODIUM SUCC 125 MG IJ SOLR
125.0000 mg | Freq: Once | INTRAMUSCULAR | Status: AC
  Administered 2015-01-20: 125 mg via INTRAVENOUS

## 2015-01-21 MED ORDER — SODIUM CHLORIDE 0.9 % IV SOLN: 125.0000 mg | Freq: Once | INTRAVENOUS | Status: DC

## 2015-01-21 MED ORDER — METHYLPREDNISOLONE SODIUM SUCC 125 MG IJ SOLR: 125.0000 mg | Freq: Once | INTRAMUSCULAR | Status: DC

## 2015-01-21 NOTE — Telephone Encounter (Signed)
Robert Frey, I declined solumedrol order because it said intravenous instead of intramuscular and 130mg  instead of 125mg .

## 2015-01-21 NOTE — Addendum Note (Signed)
Addended by: Huel Cote on: 01/21/2015 08:18 AM   Modules accepted: Orders, Medications

## 2015-01-22 ENCOUNTER — Encounter: Payer: Self-pay | Admitting: Osteopathic Medicine

## 2015-01-22 ENCOUNTER — Ambulatory Visit (INDEPENDENT_AMBULATORY_CARE_PROVIDER_SITE_OTHER): Payer: BC Managed Care – PPO | Admitting: Osteopathic Medicine

## 2015-01-22 VITALS — BP 143/83 | HR 113 | Temp 98.3°F | Wt 222.0 lb

## 2015-01-22 DIAGNOSIS — R05 Cough: Secondary | ICD-10-CM

## 2015-01-22 DIAGNOSIS — E118 Type 2 diabetes mellitus with unspecified complications: Secondary | ICD-10-CM

## 2015-01-22 DIAGNOSIS — Z79899 Other long term (current) drug therapy: Secondary | ICD-10-CM

## 2015-01-22 DIAGNOSIS — R059 Cough, unspecified: Secondary | ICD-10-CM

## 2015-01-22 DIAGNOSIS — J440 Chronic obstructive pulmonary disease with acute lower respiratory infection: Secondary | ICD-10-CM | POA: Diagnosis not present

## 2015-01-22 DIAGNOSIS — R911 Solitary pulmonary nodule: Secondary | ICD-10-CM

## 2015-01-22 DIAGNOSIS — Z79631 Long term (current) use of antimetabolite agent: Secondary | ICD-10-CM

## 2015-01-22 DIAGNOSIS — IMO0001 Reserved for inherently not codable concepts without codable children: Secondary | ICD-10-CM

## 2015-01-22 MED ORDER — LEVOFLOXACIN 750 MG PO TABS: 750.0000 mg | ORAL_TABLET | Freq: Every day | ORAL | Status: DC

## 2015-01-22 MED ORDER — IPRATROPIUM-ALBUTEROL 0.5-2.5 (3) MG/3ML IN SOLN
3.0000 mL | Freq: Once | RESPIRATORY_TRACT | Status: AC
  Administered 2015-01-22: 3 mL via RESPIRATORY_TRACT

## 2015-01-22 MED ORDER — HYDROCOD POLST-CPM POLST ER 10-8 MG/5ML PO SUER: 5.0000 mL | Freq: Two times a day (BID) | ORAL | Status: DC

## 2015-01-22 MED ORDER — IPRATROPIUM-ALBUTEROL 18-103 MCG/ACT IN AERO: 2.0000 | INHALATION_SPRAY | RESPIRATORY_TRACT | Status: DC | PRN

## 2015-01-22 NOTE — Patient Instructions (Signed)
We're treating this as an acute exacerbation of chronic bronchitis. You have declined to have an x-ray done today to evaluate for pneumonia, but the antibiotic you're taking should cover this, I have increased the dose of levofloxacin 750 mg. You should continue the steroids as instructed. I have prescribed an inhaler that you should use every 4-6 hours as needed for shortness of breath/coughing. If you develop persistent shortness of breath, fever or chills, or any other worsening concerns, you needs to be seen by a physician immediately. Otherwise follow-up with Dr. Sheppard Coil or Luvenia Starch on Monday for recheck of your symptoms. When this illness has subsided, it is recommended that you have testing done to evaluate your lung function, this can be scheduled at our office.

## 2015-01-22 NOTE — Progress Notes (Signed)
HPI: Robert Frey is a 54 y.o. male who presents to [place]  today for chief complaint of: Worsening sinus infection. . Location: chest and sinuses . Quality: congestion . Severity: worse . Duration: 4 - 5 days . Modifying factors: recent abx and steroid treatment, cough medicine. Has been coughing more. . Assoc signs/symptoms: No fever/chills, persistent cough, occasionally productive.   Type 2 diabetes. Patient compliant with metformin, due for follow-up A1c  MTX use: Patient following with rheumatology, he is currently on methotrexate, reports recent blood work with rheumatology was normal   Past medical, social and family history reviewed: Past Medical History  Diagnosis Date  . COPD (chronic obstructive pulmonary disease)   . Diabetes mellitus without complication   . Ankylosing spondylitis    Past Surgical History  Procedure Laterality Date  . Appendectomy    . Tonsillectomy and adenoidectomy    . Ent surgery    . Carpal tunnel release Right    Social History  Substance Use Topics  . Smoking status: Former Smoker    Quit date: 08/13/2004  . Smokeless tobacco: Never Used  . Alcohol Use: No   Family History  Problem Relation Age of Onset  . Alcohol abuse Other   . Depression Other   . Hypertension Other   . Hyperlipidemia Other   . Stroke Other   . Diabetes Mother     Current Outpatient Prescriptions  Medication Sig Dispense Refill  . Adalimumab (HUMIRA [place]) Inject into the skin.    Marland Kitchen atorvastatin (LIPITOR) 40 MG tablet TAKE 1 TABLET (40 MG TOTAL) BY MOUTH DAILY.  3  . chlorpheniramine-HYDROcodone (TUSSIONEX PENNKINETIC ER) 10-8 MG/5ML SUER Take 5 mLs by mouth 2 (two) times daily. 100 mL 0  . IMIPRAMINE HCL PO Take by mouth.    . levofloxacin (LEVAQUIN) 750 MG tablet Take 1 tablet (750 mg total) by mouth daily. 7 tablet 0  . metFORMIN (GLUCOPHAGE) 1000 MG tablet Take 1 tablet (1,000 mg total) by mouth 2 (two) times daily  with a meal. 60 tablet 5  . methotrexate (RHEUMATREX) 2.5 MG tablet Take 7.5 mg by mouth. Injection directed to draw up .8 mgs by RA physician     . ondansetron (ZOFRAN) 8 MG tablet Take 1 tablet (8 mg total) by mouth every 8 (eight) hours as needed for nausea or vomiting. 20 tablet 0  . predniSONE (DELTASONE) 20 MG tablet Take 2 tablets for 5 days. 10 tablet 0  . traMADol (ULTRAM) 50 MG tablet TAKE 1 TABLET IN AM AND 1 TABLET AT LUNCH AND 2TABLETS AT BEDTIME  2  . albuterol-ipratropium (COMBIVENT) 18-103 MCG/ACT inhaler Inhale 2 puffs into the lungs every 4 (four) hours as needed for wheezing or shortness of breath. 1 Inhaler 3   No current facility-administered medications for this visit.   Allergies  Allergen Reactions  . Green Dyes Hives  . Zithromax [Azithromycin Dihydrate] Rash     Review of Systems: CONSTITUTIONAL: Neg fever/chills, no unintentional weight changes HEAD/EYES/EARS/NOSE/THROAT: No headache/vision change or hearing change, sinus congestion as per history of present illness CARDIAC: No chest pain/palpitations, no orthopnea. Reports chest congestion as per history of present illness, some soreness with coughing RESPIRATORY: Positive cough as noted in history of present illness MUSCULOSKELETAL: No myalgia/arthralgia SKIN: No rash/wounds/concerning lesions Endocrine: Diabetic, no polyuria polydipsia or polyphagia HEM/ONC: No easy bruising/bleeding, no abnormal lymph node   Exam:  BP 143/83 mmHg  Pulse 113  Temp(Src) 98.3 F (36.8 C) (Oral)  Wt 222 lb (100.699 kg)  SpO2 98% Constitutional: VSS, see above. General Appearance: alert, well-developed, well-nourished, NAD coughing,  Ears, Nose, Mouth, Throat: Normal external inspection ears/nares/mouth/lips/gums, Normal TM bilaterally, MMM, posterior pharynx without erythema/exudate Neck: No masses, trachea midline. No thyroid enlargement/tenderness/mass appreciated Respiratory: Normal respiratory effort. No  dullness/hyper-resonance to percussion. Breath sounds initially clear/diminished bilaterally, patient began coughing and can hear some wheezing and rhonchorous sounds.  Cardiovascular: S1/S2 normal, no murmur/rub/gallop auscultated. No carotid bruit or JVD. No abdominal aortic bruit. Pedal pulse II/IV bilaterally DP and PT. No lower extremity edema.   No results found for this or any previous visit (from the past 72 hour(s)).    ASSESSMENT/PLAN:  Bronchitis, chronic obstructive w acute bronchitis - PATIENT DECLINES CHEST X-RAY AND BLOOD WORK 01/22/2015 - Plan: levofloxacin (LEVAQUIN) 750 MG tablet, albuterol-ipratropium (COMBIVENT) 18-103 MCG/ACT inhaler, ipratropium-albuterol (DUONEB) 0.5-2.5 (3) MG/3ML nebulizer solution 3 mL  Cough - Plan: chlorpheniramine-HYDROcodone (TUSSIONEX PENNKINETIC ER) 10-8 MG/5ML SUER, ipratropium-albuterol (DUONEB) 0.5-2.5 (3) MG/3ML nebulizer solution 3 mL  Type 2 diabetes mellitus with complication - Recent D1V indicates suboptimal control  Lung nodule < 6cm on CT - not yet due for followup (8 mos ago per problems list)  Methotrexate, long term, current use consideration for possible risk of immunocompromise patient on methotrexate   Escalating therapy with Levaquin due to concern for community-acquired pneumonia, patient refuses chest x-ray. I advised him that I recommended chest x-ray for complete evaluation of his symptoms, to ensure we are treating him appropriately and not missing anything. he again declines. Oxygen saturation normal. Patient encouraged to seek care over the weekend if he feels worse or has any concerns, otherwise follow up in clinic on Monday for recheck  Adding inhaler to treat COPD exacerbation, continue prednisone 5 day course. Recommend he return to clinic for spirometry when acute illness resolves  Of note, patient is also on methotrexate, I have no recent labs available at however she says everything was normal recently per  rheumatologist. Patient has also recently quit smoking, approximately 1 month ago. Tobacco cessation maintenance was encouraged.   Patient is overdue for follow-up with PCP for monitoring of chronic problems, he is encouraged to come at his earliest convenience to address these issues.

## 2015-01-22 NOTE — Telephone Encounter (Signed)
Will correct the order.

## 2015-03-07 ENCOUNTER — Encounter (HOSPITAL_COMMUNITY): Payer: Self-pay | Admitting: *Deleted

## 2015-03-07 ENCOUNTER — Emergency Department (HOSPITAL_COMMUNITY)
Admission: EM | Admit: 2015-03-07 | Discharge: 2015-03-07 | Disposition: A | Discharge status: Home / Self Care | Payer: BC Managed Care – PPO | Attending: Emergency Medicine | Admitting: Emergency Medicine

## 2015-03-07 ENCOUNTER — Emergency Department (HOSPITAL_COMMUNITY): Payer: BC Managed Care – PPO

## 2015-03-07 DIAGNOSIS — J449 Chronic obstructive pulmonary disease, unspecified: Secondary | ICD-10-CM | POA: Insufficient documentation

## 2015-03-07 DIAGNOSIS — Y9289 Other specified places as the place of occurrence of the external cause: Secondary | ICD-10-CM | POA: Diagnosis not present

## 2015-03-07 DIAGNOSIS — Z79899 Other long term (current) drug therapy: Secondary | ICD-10-CM | POA: Insufficient documentation

## 2015-03-07 DIAGNOSIS — E119 Type 2 diabetes mellitus without complications: Secondary | ICD-10-CM | POA: Diagnosis not present

## 2015-03-07 DIAGNOSIS — W01198A Fall on same level from slipping, tripping and stumbling with subsequent striking against other object, initial encounter: Secondary | ICD-10-CM | POA: Insufficient documentation

## 2015-03-07 DIAGNOSIS — S80211A Abrasion, right knee, initial encounter: Secondary | ICD-10-CM | POA: Diagnosis not present

## 2015-03-07 DIAGNOSIS — M25561 Pain in right knee: Secondary | ICD-10-CM

## 2015-03-07 DIAGNOSIS — R000 Tachycardia, unspecified: Secondary | ICD-10-CM | POA: Diagnosis not present

## 2015-03-07 DIAGNOSIS — S8991XA Unspecified injury of right lower leg, initial encounter: Secondary | ICD-10-CM | POA: Insufficient documentation

## 2015-03-07 DIAGNOSIS — Y998 Other external cause status: Secondary | ICD-10-CM | POA: Diagnosis not present

## 2015-03-07 DIAGNOSIS — G8911 Acute pain due to trauma: Secondary | ICD-10-CM

## 2015-03-07 DIAGNOSIS — M25512 Pain in left shoulder: Secondary | ICD-10-CM

## 2015-03-07 DIAGNOSIS — S4992XA Unspecified injury of left shoulder and upper arm, initial encounter: Secondary | ICD-10-CM | POA: Diagnosis not present

## 2015-03-07 DIAGNOSIS — Z87891 Personal history of nicotine dependence: Secondary | ICD-10-CM | POA: Diagnosis not present

## 2015-03-07 DIAGNOSIS — Y9389 Activity, other specified: Secondary | ICD-10-CM | POA: Diagnosis not present

## 2015-03-07 MED ORDER — IBUPROFEN 200 MG PO TABS
600.0000 mg | ORAL_TABLET | Freq: Once | ORAL | Status: AC
  Administered 2015-03-07: 600 mg via ORAL
  Filled 2015-03-07 (×2): qty 1

## 2015-03-07 MED ORDER — OXYCODONE-ACETAMINOPHEN 5-325 MG PO TABS
2.0000 | ORAL_TABLET | Freq: Once | ORAL | Status: AC
  Administered 2015-03-07: 2 via ORAL
  Filled 2015-03-07: qty 2

## 2015-03-07 MED ORDER — FENTANYL CITRATE (PF) 100 MCG/2ML IJ SOLN
100.0000 ug | Freq: Once | INTRAMUSCULAR | Status: AC
  Administered 2015-03-07: 100 ug via INTRAMUSCULAR
  Filled 2015-03-07: qty 2

## 2015-03-07 MED ORDER — CYCLOBENZAPRINE HCL 10 MG PO TABS: 10.0000 mg | ORAL_TABLET | Freq: Two times a day (BID) | ORAL | Status: DC | PRN

## 2015-03-07 MED ORDER — DIAZEPAM 5 MG PO TABS
5.0000 mg | ORAL_TABLET | Freq: Once | ORAL | Status: AC
Start: 2015-03-07 — End: 2015-03-07
  Administered 2015-03-07: 5 mg via ORAL
  Filled 2015-03-07: qty 1

## 2015-03-07 MED ORDER — OXYCODONE-ACETAMINOPHEN 5-325 MG PO TABS: 1.0000 | ORAL_TABLET | ORAL | Status: DC | PRN

## 2015-03-07 NOTE — ED Provider Notes (Signed)
CSN: [id number]     Arrival date & time 03/07/15  0604 History   First MD Initiated Contact with Patient 03/07/15 [phone]     Chief Complaint  Patient presents with  . Shoulder Pain     (Consider location/radiation/quality/duration/timing/severity/associated sxs/prior Treatment) HPI   Patient is a 54 year old male with history of COPD, diabetes, psoriatic arthritis, ankylosing spondylitis, who presents emergency Department with complaints of left shoulder pain and right knee pain after slipping on a step while walking up a stairwell. He states he fell forward onto the ascending stairs, sustained an abrasion to the right knee and fell onto his left arm which was extended out trying to brace his fall, with immediate pain.  The accident occurred last night at approximately 8 PM patient states he's had constant, throbbing left shoulder pain, rated 10/10, and was unable to sleep. He drove himself to the emergency department this morning to be evaluated.  He complains of severe throbbing pain felt worse in the front of his left shoulder, he states the pain is worse with any movement in the left arm.  The pain is minimally relieved with holding his arm across his torso with weight supported by his right arm.  He denies numbness or tingling in his left arm, he denies weakness. He reports feeling hot and experiencing some sweats with pain. He denies any chest pain, shortness of breath, fever, chills.  He is not on any blood thinners, denies hitting his head, denies any loss of consciousness. He has no other complaints at this time.   Past Medical History  Diagnosis Date  . COPD (chronic obstructive pulmonary disease) ([place])   . Diabetes mellitus without complication ([place])   . Ankylosing spondylitis [facility])    Past Surgical History  Procedure Laterality Date  . Appendectomy    . Tonsillectomy and adenoidectomy    . Ent surgery    . Carpal tunnel release Right    Family History  Problem Relation Age of  Onset  . Alcohol abuse Other   . Depression Other   . Hypertension Other   . Hyperlipidemia Other   . Stroke Other   . Diabetes Mother    Social History  Substance Use Topics  . Smoking status: Former Smoker    Quit date: 08/13/2004  . Smokeless tobacco: Never Used  . Alcohol Use: No    Review of Systems  Constitutional: Positive for diaphoresis. Negative for fever, chills and fatigue.  HENT: Negative.   Respiratory: Negative for cough, chest tightness and wheezing.   Cardiovascular: Negative.  Negative for chest pain, palpitations and leg swelling.  Gastrointestinal: Negative.   Genitourinary: Negative.   Musculoskeletal: Positive for arthralgias. Negative for myalgias, back pain, joint swelling, gait problem, neck pain and neck stiffness.  Neurological: Negative.  Negative for dizziness, weakness, light-headedness, numbness and headaches.  Hematological: Negative.   Psychiatric/Behavioral: Negative.       Allergies  Green dyes and Zithromax  Home Medications   Prior to Admission medications   Medication Sig Start Date End Date Taking? Authorizing Provider  albuterol-ipratropium (COMBIVENT) 18-103 MCG/ACT inhaler Inhale 2 puffs into the lungs every 4 (four) hours as needed for wheezing or shortness of breath. 01/22/15  Yes Emeterio Reeve, DO  atorvastatin (LIPITOR) 40 MG tablet TAKE 1 TABLET (40 MG TOTAL) BY MOUTH DAILY. 12/29/14  Yes Historical Provider, MD  etanercept (ENBREL) 25 MG injection Inject 25 mg into the skin once a week.    Yes Historical Provider, MD  HYDROcodone-acetaminophen (  NORCO/VICODIN) 5-325 MG tablet TAKE 1 TABLET 3 TIMES A DAY AS NEEDED FOR PAIN 01/26/15  Yes Historical Provider, MD  imipramine (TOFRANIL) 50 MG tablet Take 200 mg by mouth at bedtime.   Yes Historical Provider, MD  metFORMIN (GLUCOPHAGE) 1000 MG tablet Take 1 tablet (1,000 mg total) by mouth 2 (two) times daily with a meal. 09/16/14  Yes Jade L Breeback, PA-C  Methotrexate, PF, 7.5  MG/0.4ML SOAJ Inject 7.5 mg into the skin every 7 (seven) days.   Yes Historical Provider, MD  traMADol (ULTRAM) 50 MG tablet Take 50-100 mg by mouth See admin instructions. Take 1 tablet in the morning and at noon. Take 2 tablets at bedtime.   Yes Historical Provider, MD  chlorpheniramine-HYDROcodone (TUSSIONEX PENNKINETIC ER) 10-8 MG/5ML SUER Take 5 mLs by mouth 2 (two) times daily. Patient not taking: Reported on 03/07/2015 01/22/15   Emeterio Reeve, DO  cyclobenzaprine (FLEXERIL) 10 MG tablet Take 1 tablet (10 mg total) by mouth 2 (two) times daily as needed for muscle spasms. 03/07/15   Delsa Grana, PA-C  levofloxacin (LEVAQUIN) 750 MG tablet Take 1 tablet (750 mg total) by mouth daily. Patient not taking: Reported on 03/07/2015 01/22/15   Emeterio Reeve, DO  ondansetron (ZOFRAN) 8 MG tablet Take 1 tablet (8 mg total) by mouth every 8 (eight) hours as needed for nausea or vomiting. Patient not taking: Reported on 03/07/2015 01/20/15   Donella Stade, PA-C  oxyCODONE-acetaminophen (PERCOCET) 5-325 MG tablet Take 1 tablet by mouth every 4 (four) hours as needed. 03/07/15   Delsa Grana, PA-C  predniSONE (DELTASONE) 20 MG tablet Take 2 tablets for 5 days. Patient not taking: Reported on 03/07/2015 01/20/15   Jade L Breeback, PA-C   BP 149/96 mmHg  Pulse 112  Temp(Src) 98.2 F (36.8 C) (Oral)  Resp 16  SpO2 96% Physical Exam  Constitutional: He is oriented to person, place, and time. He appears well-developed and well-nourished. He appears distressed.  Nontoxic appearing, appears extremely uncomfortable, sweating  HENT:  Head: Normocephalic and atraumatic.  Right Ear: External ear normal.  Left Ear: External ear normal.  Nose: Nose normal.  Mouth/Throat: Oropharynx is clear and moist. No oropharyngeal exudate.  Eyes: Conjunctivae and EOM are normal. Pupils are equal, round, and reactive to light. Right eye exhibits no discharge. Left eye exhibits no discharge. No scleral icterus.  Neck:  Normal range of motion. Neck supple. No JVD present. No tracheal deviation present.  Cardiovascular: Regular rhythm and normal pulses.  Tachycardia present.  Exam reveals no gallop and no friction rub.   No murmur heard. Pulmonary/Chest: Effort normal and breath sounds normal. No accessory muscle usage or stridor. No tachypnea. No respiratory distress. He has no decreased breath sounds. He has no wheezes. He has no rhonchi. He has no rales.  No tenderness to palpation over right anterior and posterior chest wall, breath sounds heard throughout  Abdominal: Soft. He exhibits no distension. There is no tenderness.  Musculoskeletal: He exhibits tenderness. He exhibits no edema.  Left shoulder:  Normal-appearing, no obvious deformity, no sulcus sign, no visible trauma, no ttp to sternoclavicular joint, clavicle, AC joint, scapular spine. Tenderness to palpation over anterior shoulder, specifically from bicipital groove extending around deltoid towards posterior glenohumeral fossa.  No crepitus palpated with minimal left shoulder movement.  Passive internal rotation of shoulder intact, patient unable to flex shoulder beyond 90 with minimal abduction. Positive drop test.  Right knee, superficial abrasion over patella, no obvious deformities, normal passive and active  ROM, negative anterior drawer test, tenderness to palpation over LCL, no popliteal fossa tenderness, no palpable effusion, no crepitus palpated  Lymphadenopathy:    He has no cervical adenopathy.  Neurological: He is alert and oriented to person, place, and time. He exhibits normal muscle tone. Coordination normal.  Skin: Skin is warm. No rash noted. He is diaphoretic. No pallor.  Superficial abrasion to right knee with dried blood present, no surrounding edema or erythema  Psychiatric: He has a normal mood and affect. His behavior is normal. Judgment and thought content normal.    ED Course  Procedures (including critical care time) Labs  Review Labs Reviewed - No data to display  Imaging Review Dg Shoulder Left  03/07/2015   CLINICAL DATA:  Status post fall, landing on left shoulder, with left shoulder pain and limited range of motion. Initial encounter.  EXAM: LEFT SHOULDER - 2+ VIEW  COMPARISON:  Chest radiograph performed 01/27/2013  FINDINGS: There is no evidence of fracture or dislocation. The left humeral head is seated within the glenoid fossa. The acromioclavicular joint is unremarkable in appearance. No significant soft tissue abnormalities are seen. The visualized portions of the left lung are clear.  IMPRESSION: No evidence of fracture or dislocation.   Electronically Signed   By: Garald Balding M.D.   On: 03/07/2015 07:12   Dg Knee Complete 4 Views Right  03/07/2015   CLINICAL DATA:  Status post fall, with abrasion at the anterior aspect of the right knee. Initial encounter.  EXAM: RIGHT KNEE - COMPLETE 4+ VIEW  COMPARISON:  None.  FINDINGS: There is no evidence of fracture or dislocation. The joint spaces are preserved. No significant degenerative change is seen; the patellofemoral joint is grossly unremarkable in appearance.  Trace knee joint fluid remains within normal limits. The visualized soft tissues are normal in appearance.  IMPRESSION: No evidence of fracture or dislocation.   Electronically Signed   By: Garald Balding M.D.   On: 03/07/2015 07:13   I have personally reviewed and evaluated these images and lab results as part of my medical decision-making.   EKG Interpretation None      MDM   Final diagnoses:  Acute shoulder pain due to trauma, left  Right knee pain    Patient with mechanical fall sustaining injury to his left shoulder and right knee  Patient sustained an abrasion to his right knee: X-ray negative for fracture, exam reveals Normal range of motion, mild tenderness to LCL, no concern for joint instability. Patient able to ambulate without difficulty.  Given knee sleeve, instructed to do  RICE therapy  Left shoulder pain is more severe, causing tachycardia and diaphoresis. Minimal response to Fentanyl X-ray revealed no fracture or dislocation Exam is concerning for possible rotator cuff injury, limited flexion and abduction: Positive drop test  The patient was placed in sling immobilizer, given oral pain medicine, ibuprofen, dose of Valium for muscle spasm. He is a patient of Dr. Durward Fortes and has an appointment tomorrow for injections for his psoriatic arthritis.   D/C home with pain meds, muscle relaxers, has good follow-up established.      Delsa Grana, PA-C 03/07/15 [place], MD 03/08/15 [phone]

## 2015-03-07 NOTE — ED Notes (Signed)
Patient returned from X-ray 

## 2015-03-07 NOTE — Discharge Instructions (Signed)
For musculoskeletal pain (strains, sprains) RICE therapy includes rest, ice, compression, elevation. Please follow up with your Orthopedic surgeon, Dr. Durward Fortes.  Please call their office tomorrow.  Knee Pain Knee pain is a very common symptom and can have many causes. Knee pain often goes away when you follow your health care provider's instructions for relieving pain and discomfort at home. However, knee pain can develop into a condition that needs treatment. Some conditions may include:  Arthritis caused by wear and tear (osteoarthritis).  Arthritis caused by swelling and irritation (rheumatoid arthritis or gout).  A cyst or growth in your knee.  An infection in your knee joint.  An injury that will not heal.  Damage, swelling, or irritation of the tissues that support your knee (torn ligaments or tendinitis). If your knee pain continues, additional tests may be ordered to diagnose your condition. Tests may include X-rays or other imaging studies of your knee. You may also need to have fluid removed from your knee. Treatment for ongoing knee pain depends on the cause, but treatment may include:  Medicines to relieve pain or swelling.  Steroid injections in your knee.  Physical therapy.  Surgery. HOME CARE INSTRUCTIONS  Take medicines only as directed by your health care provider.  Rest your knee and keep it raised (elevated) while you are resting.  Do not do things that cause or worsen pain.  Avoid high-impact activities or exercises, such as running, jumping rope, or doing jumping jacks.  Apply ice to the knee area:  Put ice in a plastic bag.  Place a towel between your skin and the bag.  Leave the ice on for 20 minutes, 2-3 times a day.  Ask your health care provider if you should wear an elastic knee support.  Keep a pillow under your knee when you sleep.  Lose weight if you are overweight. Extra weight can put pressure on your knee.  Do not use any tobacco  products, including cigarettes, chewing tobacco, or electronic cigarettes. If you need help quitting, ask your health care provider. Smoking may slow the healing of any bone and joint problems that you may have. SEEK MEDICAL CARE IF:  Your knee pain continues, changes, or gets worse.  You have a fever along with knee pain.  Your knee buckles or locks up.  Your knee becomes more swollen. SEEK IMMEDIATE MEDICAL CARE IF:   Your knee joint feels hot to the touch.  You have chest pain or trouble breathing.   This information is not intended to replace advice given to you by your health care provider. Make sure you discuss any questions you have with your health care provider.   Document Released: 03/12/2007 Document Revised: 06/05/2014 Document Reviewed: 12/29/2013 Elsevier Interactive Patient Education 2016 [employer]  Shoulder Pain The shoulder is the joint that connects your arms to your body. The bones that form the shoulder joint include the upper arm bone (humerus), the shoulder blade (scapula), and the collarbone (clavicle). The top of the humerus is shaped like a ball and fits into a rather flat socket on the scapula (glenoid cavity). A combination of muscles and strong, fibrous tissues that connect muscles to bones (tendons) support your shoulder joint and hold the ball in the socket. Small, fluid-filled sacs (bursae) are located in different areas of the joint. They act as cushions between the bones and the overlying soft tissues and help reduce friction between the gliding tendons and the bone as you move your arm. Your shoulder  joint allows a wide range of motion in your arm. This range of motion allows you to do things like scratch your back or throw a ball. However, this range of motion also makes your shoulder more prone to pain from overuse and injury. Causes of shoulder pain can originate from both injury and overuse and usually can be grouped in the following four  categories:  Redness, swelling, and pain (inflammation) of the tendon (tendinitis) or the bursae (bursitis).  Instability, such as a dislocation of the joint.  Inflammation of the joint (arthritis).  Broken bone (fracture). HOME CARE INSTRUCTIONS   Apply ice to the sore area.  Put ice in a plastic bag.  Place a towel between your skin and the bag.  Leave the ice on for 15-20 minutes, 3-4 times per day for the first 2 days, or as directed by your health care provider.  Stop using cold packs if they do not help with the pain.  If you have a shoulder sling or immobilizer, wear it as long as your caregiver instructs. Only remove it to shower or bathe. Move your arm as little as possible, but keep your hand moving to prevent swelling.  Squeeze a soft ball or foam pad as much as possible to help prevent swelling.  Only take over-the-counter or prescription medicines for pain, discomfort, or fever as directed by your caregiver. SEEK MEDICAL CARE IF:   Your shoulder pain increases, or new pain develops in your arm, hand, or fingers.  Your hand or fingers become cold and numb.  Your pain is not relieved with medicines. SEEK IMMEDIATE MEDICAL CARE IF:   Your arm, hand, or fingers are numb or tingling.  Your arm, hand, or fingers are significantly swollen or turn white or blue. MAKE SURE YOU:   Understand these instructions.  Will watch your condition.  Will get help right away if you are not doing well or get worse.   This information is not intended to replace advice given to you by your health care provider. Make sure you discuss any questions you have with your health care provider.   Document Released: 02/22/2005 Document Revised: 06/05/2014 Document Reviewed: 09/07/2014 Elsevier Interactive Patient Education 2016 [employer] Cuff Injury Rotator cuff injury is any type of injury to the set of muscles and tendons that make up the stabilizing unit of your  shoulder. This unit holds the ball of your upper arm bone (humerus) in the socket of your shoulder blade (scapula).  CAUSES Injuries to your rotator cuff most commonly come from sports or activities that cause your arm to be moved repeatedly over your head. Examples of this include throwing, weight lifting, swimming, or racquet sports. Long lasting (chronic) irritation of your rotator cuff can cause soreness and swelling (inflammation), bursitis, and eventual damage to your tendons, such as a tear (rupture). SIGNS AND SYMPTOMS Acute rotator cuff tear:  Sudden tearing sensation followed by severe pain shooting from your upper shoulder down your arm toward your elbow.  Decreased range of motion of your shoulder because of pain and muscle spasm.  Severe pain.  Inability to raise your arm out to the side because of pain and loss of muscle power (large tears). Chronic rotator cuff tear:  Pain that usually is worse at night and may interfere with sleep.  Gradual weakness and decreased shoulder motion as the pain worsens.  Decreased range of motion. Rotator cuff tendinitis:  Deep ache in your shoulder and the outside upper arm  over your shoulder.  Pain that comes on gradually and becomes worse when lifting your arm to the side or turning it inward. DIAGNOSIS Rotator cuff injury is diagnosed through a medical history, physical exam, and imaging exam. The medical history helps determine the type of rotator cuff injury. Your health care provider will look at your injured shoulder, feel the injured area, and ask you to move your shoulder in different positions. X-ray exams typically are done to rule out other causes of shoulder pain, such as fractures. MRI is the exam of choice for the most severe shoulder injuries because the images show muscles and tendons.  TREATMENT  Chronic tear:  Medicine for pain, such as acetaminophen or ibuprofen.  Physical therapy and range-of-motion exercises may be  helpful in maintaining shoulder function and strength.  Steroid injections into your shoulder joint.  Surgical repair of the rotator cuff if the injury does not heal with noninvasive treatment. Acute tear:  Anti-inflammatory medicines such as ibuprofen and naproxen to help reduce pain and swelling.  A sling to help support your arm and rest your rotator cuff muscles. Long-term use of a sling is not advised. It may cause significant stiffening of the shoulder joint.  Surgery may be considered within a few weeks, especially in younger, active people, to return the shoulder to full function.  Indications for surgical treatment include the following:  Age younger than 60 years.  Rotator cuff tears that are complete.  Physical therapy, rest, and anti-inflammatory medicines have been used for 6-8 weeks, with no improvement.  Employment or sporting activity that requires constant shoulder use. Tendinitis:  Anti-inflammatory medicines such as ibuprofen and naproxen to help reduce pain and swelling.  A sling to help support your arm and rest your rotator cuff muscles. Long-term use of a sling is not advised. It may cause significant stiffening of the shoulder joint.  Severe tendinitis may require:  Steroid injections into your shoulder joint.  Physical therapy.  Surgery. HOME CARE INSTRUCTIONS   Apply ice to your injury:  Put ice in a plastic bag.  Place a towel between your skin and the bag.  Leave the ice on for 20 minutes, 2-3 times a day.  If you have a shoulder immobilizer (sling and straps), wear it until told otherwise by your health care provider.  You may want to sleep on several pillows or in a recliner at night to lessen swelling and pain.  Only take over-the-counter or prescription medicines for pain, discomfort, or fever as directed by your health care provider.  Do simple hand squeezing exercises with a soft rubber ball to decrease hand swelling. SEEK MEDICAL  CARE IF:   Your shoulder pain increases, or new pain or numbness develops in your arm, hand, or fingers.  Your hand or fingers are colder than your other hand. SEEK IMMEDIATE MEDICAL CARE IF:   Your arm, hand, or fingers are numb or tingling.  Your arm, hand, or fingers are increasingly swollen and painful, or they turn white or blue. MAKE SURE YOU:  Understand these instructions.  Will watch your condition.  Will get help right away if you are not doing well or get worse.   This information is not intended to replace advice given to you by your health care provider. Make sure you discuss any questions you have with your health care provider.   Document Released: 05/12/2000 Document Revised: 05/20/2013 Document Reviewed: 12/25/2012 Elsevier Interactive Patient Education [employer].

## 2015-03-07 NOTE — ED Notes (Signed)
Pt to ED c/o L shoulder pain. Pt reports slipping down the steps hitting L shoulder. Tenderness noted to shoulder. Abrasion noted to R knee. Limited movement to R arm

## 2015-03-20 ENCOUNTER — Other Ambulatory Visit: Payer: Self-pay | Admitting: Physician Assistant

## 2015-03-23 NOTE — Telephone Encounter (Signed)
We have never prescribed this medication. Does he have some one else that prescribes this.

## 2015-03-24 NOTE — Telephone Encounter (Signed)
I have never rx this. Does he have another doctor who has?

## 2015-04-01 ENCOUNTER — Other Ambulatory Visit: Payer: Self-pay | Admitting: Orthopaedic Surgery

## 2015-04-01 DIAGNOSIS — M25512 Pain in left shoulder: Secondary | ICD-10-CM

## 2015-06-08 ENCOUNTER — Ambulatory Visit: Payer: Self-pay | Admitting: Physician Assistant

## 2015-06-08 ENCOUNTER — Ambulatory Visit (INDEPENDENT_AMBULATORY_CARE_PROVIDER_SITE_OTHER): Payer: BC Managed Care – PPO | Admitting: Physician Assistant

## 2015-06-08 ENCOUNTER — Encounter: Payer: Self-pay | Admitting: Physician Assistant

## 2015-06-08 VITALS — BP 146/88 | HR 93 | Ht 71.0 in | Wt 226.0 lb

## 2015-06-08 DIAGNOSIS — R809 Proteinuria, unspecified: Secondary | ICD-10-CM | POA: Diagnosis not present

## 2015-06-08 DIAGNOSIS — Z79899 Other long term (current) drug therapy: Secondary | ICD-10-CM | POA: Diagnosis not present

## 2015-06-08 DIAGNOSIS — E119 Type 2 diabetes mellitus without complications: Secondary | ICD-10-CM

## 2015-06-08 DIAGNOSIS — I10 Essential (primary) hypertension: Secondary | ICD-10-CM | POA: Diagnosis not present

## 2015-06-08 DIAGNOSIS — E7800 Pure hypercholesterolemia, unspecified: Secondary | ICD-10-CM

## 2015-06-08 DIAGNOSIS — T148XXA Other injury of unspecified body region, initial encounter: Secondary | ICD-10-CM

## 2015-06-08 LAB — POCT UA - MICROALBUMIN
Creatinine, POC: 200 mg/dL
MICROALBUMIN (UR) POC: 80 mg/L

## 2015-06-08 LAB — POCT GLYCOSYLATED HEMOGLOBIN (HGB A1C): HEMOGLOBIN A1C: 6.8

## 2015-06-08 MED ORDER — METFORMIN HCL 1000 MG PO TABS: 1000.0000 mg | ORAL_TABLET | Freq: Two times a day (BID) | ORAL | Status: DC

## 2015-06-08 MED ORDER — LISINOPRIL 20 MG PO TABS: 20.0000 mg | ORAL_TABLET | Freq: Every day | ORAL | Status: DC

## 2015-06-08 NOTE — Progress Notes (Signed)
   Subjective:    Patient ID: Robert Frey, male    DOB: 12/22/1960, 55 y.o.   MRN: [id number]  HPI  Patient is a 55 year old male who presents to the clinic to follow-up on diabetes.  Diabetes-patient does not check sugars regularly but lately his fasting sugars have elevated to around 140-160. He feels like his neuropathy though has improved. He denies any open wounds or lesions. He has had some easy bruising but he wonders about. He denies any hypoglycemic events. He continues to take metformin 1000 mg twice a day.  Patient has also noticed that his blood pressure has slowly been increasing. The last couple times his blood pressure has been elevated. He denies any chest pains, palpitations, dizziness, vision changes. He has never been on any medication for blood pressure.   Review of Systems  All other systems reviewed and are negative.      Objective:   Physical Exam  Constitutional: He is oriented to person, place, and time. He appears well-developed and well-nourished.  HENT:  Head: Normocephalic and atraumatic.  Cardiovascular: Normal rate, regular rhythm and normal heart sounds.   Pulmonary/Chest: Effort normal and breath sounds normal. He has no wheezes.  Neurological: He is alert and oriented to person, place, and time.  Psychiatric: He has a normal mood and affect. His behavior is normal.          Assessment & Plan:  DM type II/mircoalbumin- ... Lab Results  Component Value Date   HGBA1C 6.8 06/08/2015   microalbumin positive for protein. Patient was started on lisinopril today. Continue metformin. Follow-up in 3 months. Encouraged exercise and low carb/low sugar diet.  Encouraged patient to schedule eye exam. Patient [name] has Pneumovax 23.   HTN- blood pressure continues to be elevated. Patient was started on lisinopril 20 mg once a day. Protect his kidneys. Side effects discussed. Will recheck blood pressure in 3 months at diabetic  check.   Hypercholesterolemia- we'll recheck lipid level today. Will adjust medications appropriately.  Easy bruising-I do think this is likely coming from his medications for ankylosing spondylitis. Patient is on Enbrel. He should discuss easy bruising with his rheumatologist. He reports he has an appointment next week.

## 2015-06-09 DIAGNOSIS — T148XXA Other injury of unspecified body region, initial encounter: Secondary | ICD-10-CM | POA: Insufficient documentation

## 2015-07-22 ENCOUNTER — Ambulatory Visit (INDEPENDENT_AMBULATORY_CARE_PROVIDER_SITE_OTHER): Payer: BC Managed Care – PPO | Admitting: Osteopathic Medicine

## 2015-07-22 ENCOUNTER — Encounter: Payer: Self-pay | Admitting: Osteopathic Medicine

## 2015-07-22 ENCOUNTER — Ambulatory Visit (INDEPENDENT_AMBULATORY_CARE_PROVIDER_SITE_OTHER): Payer: BC Managed Care – PPO

## 2015-07-22 VITALS — BP 137/79 | HR 94 | Temp 97.7°F | Ht 71.0 in | Wt 224.0 lb

## 2015-07-22 DIAGNOSIS — R062 Wheezing: Secondary | ICD-10-CM | POA: Diagnosis not present

## 2015-07-22 DIAGNOSIS — Z8709 Personal history of other diseases of the respiratory system: Secondary | ICD-10-CM

## 2015-07-22 DIAGNOSIS — J069 Acute upper respiratory infection, unspecified: Secondary | ICD-10-CM | POA: Diagnosis not present

## 2015-07-22 DIAGNOSIS — R05 Cough: Secondary | ICD-10-CM | POA: Diagnosis not present

## 2015-07-22 DIAGNOSIS — R52 Pain, unspecified: Secondary | ICD-10-CM | POA: Diagnosis not present

## 2015-07-22 DIAGNOSIS — B9789 Other viral agents as the cause of diseases classified elsewhere: Principal | ICD-10-CM

## 2015-07-22 LAB — POCT INFLUENZA A/B
INFLUENZA A, POC: NEGATIVE
INFLUENZA B, POC: NEGATIVE

## 2015-07-22 MED ORDER — IPRATROPIUM BROMIDE 0.03 % NA SOLN: 2.0000 | Freq: Three times a day (TID) | NASAL | Status: DC

## 2015-07-22 MED ORDER — METHYLPREDNISOLONE 4 MG PO TBPK: ORAL_TABLET | ORAL | Status: DC

## 2015-07-22 MED ORDER — HYDROCODONE-HOMATROPINE 5-1.5 MG/5ML PO SYRP: 5.0000 mL | ORAL_SOLUTION | Freq: Three times a day (TID) | ORAL | Status: DC | PRN

## 2015-07-22 NOTE — Progress Notes (Signed)
HPI: Robert Frey is a 55 y.o. male who presents to [place]  today for chief complaint of:  Chief Complaint  Patient presents with  . Nasal Congestion  . Cough  . Generalized Body Aches    . Location: Generalized aches, sinus pain, . Quality: thought had flu it came on so fast - . Assoc signs/symptoms:  headache, neck pain, body aches, coughing . Duration: started about 8:00 pm yesterday . Modifying factors: has tried the following OTC/Rx medications: Mucinex, Tylenol last night  without relief . Context: Patient states that he has history of asthma, patient is also on methotrexate for psoriasis/ankylosing spondylitis, following with rheumatology, records reviewed from visit 06/18/2015   Past medical, social and family history reviewed. Current medications and allergies reviewed.     Review of Systems: CONSTITUTIONAL: subjective fever/chills HEAD/EYES/EARS/NOSE/THROAT: yes headache, no vision change or hearing change, yes sore throat CARDIAC: No chest pain/pressure/palpitations, no orthopnea RESPIRATORY: yes cough, no shortness of breath GASTROINTESTINAL: yes nausea, 2 days ago vomiting, no abdominal pain, no blood in stool, no diarrhea MUSCULOSKELETAL: yes myalgia/arthralgia   Exam:  BP 137/79 mmHg  Pulse 94  Temp(Src) 97.7 F (36.5 C) (Oral)  Ht 5\' 11"  (1.803 m)  Wt 224 lb (101.606 kg)  BMI 31.26 kg/m2 Constitutional: VSS, see above. General Appearance: alert, well-developed, well-nourished, NAD Eyes: Normal lids and conjunctive, non-icteric sclera, PERRLA Ears, Nose, Mouth, Throat: Normal external inspection ears/nares/mouth/lips/gums, normal TM, MMM;       posterior pharynx with erythema, without exudate Neck: No masses, trachea midline. No thyroid enlargement/tenderness/mass appreciated, normal lymph nodes Respiratory: Normal respiratory effort. Yes  Wheeze mild on right lower lobe, no rhonchi/rales Cardiovascular: S1/S2 normal,  no murmur/rub/gallop auscultated. RRR. No lower extremity edema.    Results for orders placed or performed in visit on 07/22/15 (from the past 72 hour(s))  POCT Influenza A/B     Status: None   Collection Time: 07/22/15 11:03 AM  Result Value Ref Range   Influenza A, POC Negative Negative   Influenza B, POC Negative Negative   Dg Chest 2 View  07/22/2015  CLINICAL DATA:  Wheezing cough and chest congestion for the past day; history of asthma-COPD, former smoker. EXAM: CHEST  2 VIEW COMPARISON:  PA and lateral chest x-ray of January 27, 2013. FINDINGS: The lungs are adequately inflated. The interstitial markings are coarse but this is not entirely new. There is no alveolar infiltrate. There is no pleural effusion. No pulmonary parenchymal nodules or masses are observed. The heart and pulmonary vascularity are normal. The mediastinum is normal in width. There is mild multilevel degenerative disc disease of the thoracic spine. IMPRESSION: Chronic interstitial changes consistent with asthma or COPD. There is no evidence of pneumonia, CHF, nor other acute cardiopulmonary abnormality. Electronically Signed   By: David  Martinique M.D.   On: 07/22/2015 14:49    CXR on personal review Cardiomediastinal silhouette/heart size: normal Obvious bony abnormality: none Infiltrate: none Mass or other opacity: none Atelectasis: none Diaphragms: normal Lateral view: normal Pt counseled that radiologist will review the images as well, our office will call if the formal read reveals any significant findings other than what has been noted above.    ASSESSMENT/PLAN: Negative for influenza based on point care test, patient is afebrile and no GI symptoms at this time, patient advised that if he feels like he's getting worse let us know so that we can start antivirals however I have low suspicion for flu baced on  clinical symptoms. Patient was sent for chest x-ray due to faint wheeze heard on lung auscultation, x-ray  personally reviewed, no concerns, radiology report corresponds to personal read. Concern for findings on x-ray consistent with asthma/COPD, patient does not carry diagnosis of this but may consider spirometry in the future. Patient is on methotrexate, we called his rheumatology office for most recent blood work, patient was there in January 2017, they state all he had was urine drug screen. RTC/ER precautions reviewed. No concern for bacterial infection at this time, however given the patient is on methotrexate would be helpful to ensure that he is up-to-date on routine monitoring for this either through rheumatology or through his PCP.  Viral URI with cough - Plan: methylPREDNISolone (MEDROL DOSEPAK) 4 MG TBPK tablet, ipratropium (ATROVENT) 0.03 % nasal spray, HYDROcodone-homatropine (HYCODAN) 5-1.5 MG/5ML syrup  Body aches - Plan: POCT Influenza A/B  History of asthma - Plan: DG Chest 2 View  Wheeze - Plan: DG Chest 2 View    Return if symptoms worsen or fail to improve.

## 2015-07-22 NOTE — Patient Instructions (Signed)
Most likely this is a viral on this however there is some concern for possible asthma exacerbation. Make sure that you're taking the Combivent up to every 2 hours as needed, at least be taking this 3 times a day while you're sick. He also be given prescription for steroid taper, cough medicine, and nasal spray. Depending on the x-ray, that will determine if you need antibiotics right away, however if you're not feeling better in the next 7-10 days, or if you are feeling worse sooner than not, please let the office know and we may need to consider antibiotic treatment that at this time most likely this is a virus which will not be helped by antibiotics. If you develop significant fever, productive cough, shortness of breath, please seek medical care either in this office or in urgent care/ER over the weekend if needed

## 2015-07-24 ENCOUNTER — Encounter: Payer: Self-pay | Admitting: Emergency Medicine

## 2015-07-24 ENCOUNTER — Emergency Department
Admission: EM | Admit: 2015-07-24 | Discharge: 2015-07-24 | Disposition: A | Discharge status: Home / Self Care | Payer: BC Managed Care – PPO | Source: Home / Self Care | Attending: Family Medicine | Admitting: Family Medicine

## 2015-07-24 DIAGNOSIS — J069 Acute upper respiratory infection, unspecified: Secondary | ICD-10-CM | POA: Diagnosis not present

## 2015-07-24 DIAGNOSIS — J309 Allergic rhinitis, unspecified: Secondary | ICD-10-CM

## 2015-07-24 DIAGNOSIS — H65191 Other acute nonsuppurative otitis media, right ear: Secondary | ICD-10-CM | POA: Diagnosis not present

## 2015-07-24 DIAGNOSIS — H1013 Acute atopic conjunctivitis, bilateral: Secondary | ICD-10-CM

## 2015-07-24 MED ORDER — OLOPATADINE HCL 0.1 % OP SOLN: 1.0000 [drp] | Freq: Two times a day (BID) | OPHTHALMIC | Status: DC

## 2015-07-24 MED ORDER — ALBUTEROL SULFATE HFA 108 (90 BASE) MCG/ACT IN AERS: 1.0000 | INHALATION_SPRAY | Freq: Four times a day (QID) | RESPIRATORY_TRACT | Status: DC | PRN

## 2015-07-24 MED ORDER — CEFDINIR 300 MG PO CAPS: 300.0000 mg | ORAL_CAPSULE | Freq: Two times a day (BID) | ORAL | Status: DC
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
## 2015-07-24 NOTE — Discharge Instructions (Signed)
You may take 400-600mg  Ibuprofen (Motrin) every 6-8 hours for fever and pain  Alternate with Tylenol  You may take 500mg  Tylenol every 4-6 hours as needed for fever and pain  Follow-up with your primary care provider next week for recheck of symptoms if not improving.  Be sure to drink plenty of fluids and rest, at least 8hrs of sleep a night, preferably more while you are sick. Return urgent care or go to closest ER if you cannot keep down fluids/signs of dehydration, fever not reducing with Tylenol, difficulty breathing/wheezing, stiff neck, worsening condition, or other concerns (see below)  Please take antibiotics as prescribed and be sure to complete entire course even if you start to feel better to ensure infection does not come back.   Cool Mist Vaporizers Vaporizers may help relieve the symptoms of a cough and cold. They add moisture to the air, which helps mucus to become thinner and less sticky. This makes it easier to breathe and cough up secretions. Cool mist vaporizers do not cause serious burns like hot mist vaporizers, which may also be called steamers or humidifiers. Vaporizers have not been proven to help with colds. You should not use a vaporizer if you are allergic to mold. HOME CARE INSTRUCTIONS  Follow the package instructions for the vaporizer.  Do not use anything other than distilled water in the vaporizer.  Do not run the vaporizer all of the time. This can cause mold or bacteria to grow in the vaporizer.  Clean the vaporizer after each time it is used.  Clean and dry the vaporizer well before storing it.  Stop using the vaporizer if worsening respiratory symptoms develop.   This information is not intended to replace advice given to you by your health care provider. Make sure you discuss any questions you have with your health care provider.   Document Released: 02/10/2004 Document Revised: 05/20/2013 Document Reviewed: 10/02/2012 Elsevier Interactive Patient  Education 2016 [employer].  Allergic Conjunctivitis A thin, clear membrane (conjunctiva) covers the white part of your eye and the inner surface of your eyelid. Allergic conjunctivitis happens when this membrane gets irritated. This is caused by allergies. Common things (allergens) that can cause an allergic reaction include:  Dust.  Pollen.  Mold.  Animal:  Hair.  Fur.  Skin.  Saliva or other animal fluids. This condition can make your eye red or pink. It can also make your eye feel itchy. This condition cannot be spread by one person to another person (noncontagious).  HOME CARE  Take or apply medicines only as told by your doctor.  Avoid touching or rubbing your eyes.  Apply a cool, clean washcloth to your eye for 10-20 minutes. Do this 3-4 times a day.  If you wear contact lenses, do not wear them until the irritation is gone. Wear glasses in the meantime.  Avoid wearing eye makeup until the irritation is gone.  Try to avoid whatever allergen is causing the allergic reaction. GET HELP IF:  Your symptoms get worse.  You have pus draining from your eyes.  You have new symptoms.  You have a fever.   This information is not intended to replace advice given to you by your health care provider. Make sure you discuss any questions you have with your health care provider.   Document Released: 11/02/2009 Document Revised: 06/05/2014 Document Reviewed: 02/24/2014 Elsevier Interactive Patient Education [employer].
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
## 2015-07-27 ENCOUNTER — Ambulatory Visit (INDEPENDENT_AMBULATORY_CARE_PROVIDER_SITE_OTHER): Payer: BC Managed Care – PPO | Admitting: Physician Assistant

## 2015-07-27 ENCOUNTER — Encounter: Payer: Self-pay | Admitting: Physician Assistant

## 2015-07-27 VITALS — BP 161/70 | HR 99 | Ht 71.0 in | Wt 221.0 lb

## 2015-07-27 DIAGNOSIS — R05 Cough: Secondary | ICD-10-CM

## 2015-07-27 DIAGNOSIS — R059 Cough, unspecified: Secondary | ICD-10-CM

## 2015-07-27 DIAGNOSIS — J208 Acute bronchitis due to other specified organisms: Secondary | ICD-10-CM | POA: Diagnosis not present

## 2015-07-27 MED ORDER — ALBUTEROL SULFATE (2.5 MG/3ML) 0.083% IN NEBU: 2.5000 mg | INHALATION_SOLUTION | Freq: Once | RESPIRATORY_TRACT | Status: DC

## 2015-07-27 MED ORDER — IPRATROPIUM-ALBUTEROL 0.5-2.5 (3) MG/3ML IN SOLN
3.0000 mL | Freq: Once | RESPIRATORY_TRACT | Status: AC
  Administered 2015-07-27: 3 mL via RESPIRATORY_TRACT

## 2015-07-27 MED ORDER — PREDNISONE 20 MG PO TABS: ORAL_TABLET | ORAL | Status: DC

## 2015-07-27 MED ORDER — LEVOFLOXACIN 500 MG PO TABS: 500.0000 mg | ORAL_TABLET | Freq: Every day | ORAL | Status: DC

## 2015-07-27 MED ORDER — HYDROCOD POLST-CPM POLST ER 10-8 MG/5ML PO SUER: 5.0000 mL | Freq: Two times a day (BID) | ORAL | Status: DC | PRN

## 2015-07-27 MED ORDER — POLYMYXIN B-TRIMETHOPRIM 10000-0.1 UNIT/ML-% OP SOLN: 2.0000 [drp] | OPHTHALMIC | Status: DC

## 2015-07-27 MED ORDER — IPRATROPIUM-ALBUTEROL 0.5-2.5 (3) MG/3ML IN SOLN: 3.0000 mL | Freq: Four times a day (QID) | RESPIRATORY_TRACT | Status: DC

## 2015-07-27 MED ORDER — METHYLPREDNISOLONE SODIUM SUCC 125 MG IJ SOLR
125.0000 mg | Freq: Once | INTRAMUSCULAR | Status: AC
  Administered 2015-07-27: 125 mg via INTRAMUSCULAR

## 2015-07-27 NOTE — Patient Instructions (Signed)
Needs spironmetry.

## 2015-07-28 NOTE — Progress Notes (Signed)
   Subjective:    Patient ID: Robert Frey, male    DOB: 14-May-1961, 55 y.o.   MRN: [id number]  HPI Pt is a 55 yo male who presents to the clinic with with acute upper respiratory symptoms that have not improved. He saw Dr. Sheppard Coil last week and given medrol dose pak and zpak with hycodan. No real improvement. He infact feels worse. CXR was normal last week. No fever. He does have significant wheezing and SOB. Both eyes have purulent discharge and very achy. No energy.    Review of Systems    see HPI Objective:   Physical Exam  Constitutional: He is oriented to person, place, and time.  Flushed cheeks.   HENT:  Head: Normocephalic and atraumatic.  TM's dull, erythematous and bulging.  Tenderness over maxillary and frontal sinuses to palpation.  Bilateral nasal turbinates red and swollen.  Oropharynx erythematous.   Eyes:  Bilateral injected conjunctiva with purulent discharge. Pain to palpation around eyes.   Neck: Normal range of motion. Neck supple.  Cardiovascular: Normal rate, regular rhythm and normal heart sounds.   Pulmonary/Chest:  Bilateral wheezing.   Lymphadenopathy:    He has no cervical adenopathy.  Neurological: He is alert and oriented to person, place, and time.  Psychiatric: He has a normal mood and affect. His behavior is normal.          Assessment & Plan:  Acute bronchitis with acute asthma exacerbation-duoneb given in office today with much improvement. Solumedrol 125mg  IM given today. Prednisone taper. levquin for 7 days.tussinonex given for cough at bedtime. Continue albuterol. In 2 weeks needs spironmetry. May need to consider ICS. Follow up as needed.    Bilateral bacterial conjunctivitis- polytrim given for 7 days. Dicussed warm compresses.

## 2015-08-09 ENCOUNTER — Ambulatory Visit: Payer: Self-pay | Admitting: Physician Assistant

## 2015-08-09 ENCOUNTER — Other Ambulatory Visit: Payer: Self-pay

## 2015-08-10 ENCOUNTER — Ambulatory Visit (INDEPENDENT_AMBULATORY_CARE_PROVIDER_SITE_OTHER): Payer: BC Managed Care – PPO | Admitting: Physician Assistant

## 2015-08-10 VITALS — BP 128/78 | HR 91 | Ht 71.0 in | Wt 220.0 lb

## 2015-08-10 DIAGNOSIS — R05 Cough: Secondary | ICD-10-CM | POA: Diagnosis not present

## 2015-08-10 DIAGNOSIS — R0602 Shortness of breath: Secondary | ICD-10-CM

## 2015-08-10 DIAGNOSIS — R059 Cough, unspecified: Secondary | ICD-10-CM

## 2015-08-10 MED ORDER — ALBUTEROL SULFATE (2.5 MG/3ML) 0.083% IN NEBU
2.5000 mg | INHALATION_SOLUTION | Freq: Once | RESPIRATORY_TRACT | Status: AC
  Administered 2015-08-10: 2.5 mg via RESPIRATORY_TRACT

## 2015-08-10 NOTE — Progress Notes (Signed)
Patient came into clinic today for spirometry due to chronic cough and shortness of breath. Pt is a former smoker but reports he stopped smoking about 6 months ago. There is no availability for Pt to follow up regarding results with PCP today so Pt will schedule a separate appointment to do so. Pt is OK to do so, this means he will not be late for work. Spirometry completed and results placed in PCP's basket for review. No further question at this time.   Reviewed: FVC and FEV1 both decreased by FEV1/FVC is above normal. No improvement with post bronchodilator. This is suggestive of restricted lung disease. I am going to refer to pulmonologist for more testing. Iran Planas PA-C

## 2015-08-11 ENCOUNTER — Encounter: Payer: Self-pay | Admitting: Physician Assistant

## 2015-08-11 ENCOUNTER — Telehealth: Payer: Self-pay | Admitting: Physician Assistant

## 2015-08-11 DIAGNOSIS — Z87891 Personal history of nicotine dependence: Secondary | ICD-10-CM

## 2015-08-11 DIAGNOSIS — R942 Abnormal results of pulmonary function studies: Secondary | ICD-10-CM

## 2015-08-11 DIAGNOSIS — R059 Cough, unspecified: Secondary | ICD-10-CM

## 2015-08-11 DIAGNOSIS — R05 Cough: Secondary | ICD-10-CM

## 2015-08-11 NOTE — Telephone Encounter (Signed)
I reviewed results. There are some signs of restrictive lung disease. Most lung diseases are obstructed or more due to asthma. I want you to see pulmonology for further work up of restrictive causes of lung disease. Will send spironmetry results. You can cancel appt with me.

## 2015-08-11 NOTE — Telephone Encounter (Signed)
Pt advised of results and recommendation. verbalized understanding.

## 2015-08-18 ENCOUNTER — Other Ambulatory Visit: Payer: Self-pay | Admitting: Osteopathic Medicine

## 2015-09-06 ENCOUNTER — Ambulatory Visit: Payer: Self-pay | Admitting: Physician Assistant

## 2015-09-29 ENCOUNTER — Other Ambulatory Visit: Payer: Self-pay | Admitting: Physician Assistant

## 2015-09-30 ENCOUNTER — Other Ambulatory Visit: Payer: Self-pay | Admitting: *Deleted

## 2015-09-30 MED ORDER — IMIPRAMINE HCL 50 MG PO TABS: 200.0000 mg | ORAL_TABLET | Freq: Every day | ORAL | Status: DC

## 2016-04-10 ENCOUNTER — Other Ambulatory Visit: Payer: Self-pay | Admitting: Physician Assistant

## 2016-05-06 ENCOUNTER — Encounter: Payer: Self-pay | Admitting: Emergency Medicine

## 2016-05-06 ENCOUNTER — Emergency Department (INDEPENDENT_AMBULATORY_CARE_PROVIDER_SITE_OTHER): Payer: BC Managed Care – PPO

## 2016-05-06 ENCOUNTER — Emergency Department
Admission: EM | Admit: 2016-05-06 | Discharge: 2016-05-06 | Disposition: A | Discharge status: Home / Self Care | Payer: BC Managed Care – PPO | Source: Home / Self Care | Attending: Emergency Medicine | Admitting: Emergency Medicine

## 2016-05-06 DIAGNOSIS — M769 Unspecified enthesopathy, lower limb, excluding foot: Secondary | ICD-10-CM

## 2016-05-06 DIAGNOSIS — D1622 Benign neoplasm of long bones of left lower limb: Secondary | ICD-10-CM

## 2016-05-06 DIAGNOSIS — S83282A Other tear of lateral meniscus, current injury, left knee, initial encounter: Secondary | ICD-10-CM

## 2016-05-06 DIAGNOSIS — M25562 Pain in left knee: Secondary | ICD-10-CM

## 2016-05-06 MED ORDER — HYDROCODONE-ACETAMINOPHEN 5-325 MG PO TABS: 1.0000 | ORAL_TABLET | Freq: Four times a day (QID) | ORAL | 0 refills | Status: DC | PRN

## 2016-05-06 NOTE — ED Provider Notes (Signed)
[name]    CSN: [id number] Arrival date & time: 05/06/16  1200     History   Chief Complaint Chief Complaint  Patient presents with  . Knee Pain    HPI Robert Frey is a 55 y.o. male.   HPI Pt c/o Severe, sharp left lateral knee pain, without radiation. Acute injury: twisted 2 days ago moving chairs, pain has progressively gotten worse. Hurts to move knee. Pain improves a little with rest. Been difficult to weight-bear, feels like knee might give out. History of knee injuries many years ago, has seen orthopedic physicians at [facility] in the past, but not recently. Past Medical History:  Diagnosis Date  . Ankylosing spondylitis ([place])   . COPD (chronic obstructive pulmonary disease) ([place])   . Diabetes mellitus without complication [facility])     Patient Active Problem List   Diagnosis Date Noted  . Bruising 06/09/2015  . Essential hypertension, benign 06/08/2015  . Microalbuminuria 06/08/2015  . Lung nodule < 6cm on CT 05/15/2014  . Heel spur 01/20/2014  . Osteoarthritis of foot, left 01/20/2014  . Psoriasis 10/11/2012  . Type II diabetes mellitus, uncontrolled ([place]) 10/11/2012  . ANKYLOSING SPONDYLITIS 09/30/2009  . KNEE PAIN, BILATERAL 12/28/2006  . ELEVATED BLOOD PRESSURE WITHOUT DIAGNOSIS OF HYPERTENSION 08/29/2006  . HYPERCHOLESTEROLEMIA 03/06/2006  . OBESITY, NOS 03/06/2006  . OBSESSIVE COMPUL. DISORDER 03/06/2006    Past Surgical History:  Procedure Laterality Date  . APPENDECTOMY    . CARPAL TUNNEL RELEASE Right   . ent surgery    . TONSILLECTOMY AND ADENOIDECTOMY         Home Medications    Prior to Admission medications   Medication Sig Start Date End Date Taking? Authorizing Provider  HYDROcodone-acetaminophen (NORCO/VICODIN) 5-325 MG tablet Take 1-2 tablets by mouth every 6 (six) hours as needed for severe pain. Take with food. 05/06/16   Jacqulyn Cane, MD  imipramine (TOFRANIL) 50 MG tablet Take 200 mg by mouth at bedtime.     Historical Provider, MD  imipramine (TOFRANIL) 50 MG tablet TAKE 4 TABLETS (200 MG TOTAL) BY MOUTH AT BEDTIME. 04/10/16   Jade L Breeback, PA-C  metFORMIN (GLUCOPHAGE) 1000 MG tablet Take 1 tablet (1,000 mg total) by mouth 2 (two) times daily with a meal. 06/08/15   Donella Stade, PA-C    Family History Family History  Problem Relation Age of Onset  . Stroke Other   . Alcohol abuse Other   . Depression Other   . Hypertension Other   . Hyperlipidemia Other   . Diabetes Mother     Social History Social History  Substance Use Topics  . Smoking status: Former Smoker    Quit date: 08/13/2004  . Smokeless tobacco: Never Used  . Alcohol use No     Allergies   Green dyes and Zithromax [azithromycin dihydrate]   Review of Systems Review of Systems  All other systems reviewed and are negative.    Physical Exam Triage Vital Signs ED Triage Vitals  Enc Vitals Group     BP 05/06/16 1232 145/76     Pulse Rate 05/06/16 1232 86     Resp --      Temp 05/06/16 1232 97.9 F (36.6 C)     Temp Source 05/06/16 1232 Oral     SpO2 05/06/16 1232 98 %     Weight 05/06/16 1232 226 lb 12 oz (102.9 kg)     Height 05/06/16 1232 6' (1.829 m)  Head Circumference --      Peak Flow --      Pain Score 05/06/16 1235 7     Pain Loc --      Pain Edu? --      Excl. in [place]? --    No data found.   Updated Vital Signs BP 145/76 (BP Location: Left Arm)   Pulse 86   Temp 97.9 F (36.6 C) (Oral)   Ht 6' (1.829 m)   Wt 226 lb 12 oz (102.9 kg)   SpO2 98%   BMI 30.75 kg/m   Visual Acuity Right Eye Distance:   Left Eye Distance:   Bilateral Distance:    Right Eye Near:   Left Eye Near:    Bilateral Near:     Physical Exam  Constitutional: He is oriented to person, place, and time. He appears well-developed and well-nourished. No distress.  HENT:  Head: Normocephalic and atraumatic.  Eyes: Pupils are equal, round, and reactive to light. No scleral icterus.  Neck: Normal range of  motion. Neck supple.  Cardiovascular: Normal rate and regular rhythm.   Pulmonary/Chest: Effort normal.  Abdominal: He exhibits no distension.  Musculoskeletal:       Left knee: He exhibits decreased range of motion, swelling and bony tenderness. He exhibits no ecchymosis, no laceration and no erythema. Tenderness found. Lateral joint line tenderness noted. No patellar tendon tenderness noted.  Positive crepitus and positive McMurray's sign left knee. Negative anterior drawer sign  Neurological: He is alert and oriented to person, place, and time. No cranial nerve deficit.  Skin: Skin is warm and dry. No bruising and no rash noted.  Psychiatric: He has a normal mood and affect. His behavior is normal.  Vitals reviewed.    UC Treatments / Results  Labs (all labs ordered are listed, but only abnormal results are displayed) Labs Reviewed - No data to display  EKG  EKG Interpretation None       Radiology Dg Knee Complete 4 Views Left  Result Date: 05/06/2016 CLINICAL DATA:  Pt states he twisted his left knee on Thursday and has been having lateral and posterior pain since. Pain increases when bearing weight and when bending knee. EXAM: LEFT KNEE - COMPLETE 4+ VIEW COMPARISON:  02/04/2008 MRI and prior radiographs from 12/28/2006 FINDINGS: Mild spurring of the tibial spine. Suspected small medial osteochondroma of the proximal fibular metaphysis. Chronically fragmented spur of the distal quadriceps tendon. Minimal marginal spurring of the lower patella. Upper normal amount of fluid in the suprapatellar bursa. No fracture observed. IMPRESSION: 1. New chronically fragmented spurring of the distal quadriceps tendon compared to 2008. 2. No fracture or acute bony findings. 3. Small proximal fibular metaphyseal osteochondroma medially. 4. If pain persists despite conservative therapy, MRI may be warranted for further characterization. Electronically Signed   By: Van Clines M.D.   On:  05/06/2016 12:57    Procedures Procedures (including critical care time)  Medications Ordered in UC Medications - No data to display   Initial Impression / Assessment and Plan / UC Course  I have reviewed the triage vital signs and the nursing notes.  Pertinent labs & imaging results that were available during my care of the patient were reviewed by me and considered in my medical decision making (see chart for details).  Clinical Course as of May 06 1337  Sat May 06, 2016  1306 1. New chronically fragmented spurring of the distal quadriceps tendon compared to 2008. 2. No fracture or  acute bony findings. 3. Small proximal fibular metaphyseal osteochondroma medially. Reviewed and discussed the above with patient  [DM]    Clinical Course User Index [DM] Jacqulyn Cane, MD  Discussed with patient, gave copy of x-ray report, I advised him to discuss that with his orthopedist   Final Clinical Impressions(s) / UC Diagnoses   Final diagnoses:  Acute pain of left knee  Other tear of lateral meniscus of left knee as current injury, initial encounter   Left knee immobilizer placed, and he felt that relieved the pain somewhat. He declined crutches. An After Visit Summary was printed and given to the patient. Follow-up with your Orthopedist at [facility] (has seen in the past for other orthopedic problems) within the next week.  Red flags discussed. Questions invited and answered. Patient voiced understanding and agreement.  New Prescriptions New Prescriptions   HYDROCODONE-ACETAMINOPHEN (NORCO/VICODIN) 5-325 MG TABLET    Take 1-2 tablets by mouth every 6 (six) hours as needed for severe pain. Take with food.  Vicodin for short-term relief of severe pain. Otherwise use ibuprofen when necessary pain-precautions discussed with these medicines.   Jacqulyn Cane, MD 05/06/16 2036

## 2016-05-06 NOTE — Discharge Instructions (Signed)
Ice and elevate left knee for another day. Wear the knee immobilizer. OTC ibuprofen for moderate pain Prescription for Vicodin as needed for severe pain. No walking or climbing at work for the next week.. Follow-up with your doctor at [facility] orthopedics within the next week for further evaluation and treatment.-Bring a copy of your printed x-ray report with you to show to the orthopedist.

## 2016-05-06 NOTE — ED Triage Notes (Signed)
Pt c/o left knee pain, twisted 2 days ago moving chairs, pain has progressively gotten worse.

## 2016-07-10 ENCOUNTER — Encounter: Payer: Self-pay | Admitting: Emergency Medicine

## 2016-07-10 ENCOUNTER — Emergency Department (INDEPENDENT_AMBULATORY_CARE_PROVIDER_SITE_OTHER): Payer: BC Managed Care – PPO

## 2016-07-10 ENCOUNTER — Emergency Department (INDEPENDENT_AMBULATORY_CARE_PROVIDER_SITE_OTHER)
Admission: EM | Admit: 2016-07-10 | Discharge: 2016-07-10 | Disposition: A | Discharge status: Home / Self Care | Payer: BC Managed Care – PPO | Source: Home / Self Care | Attending: Family Medicine | Admitting: Family Medicine

## 2016-07-10 DIAGNOSIS — S60111A Contusion of right thumb with damage to nail, initial encounter: Secondary | ICD-10-CM | POA: Diagnosis not present

## 2016-07-10 DIAGNOSIS — M79644 Pain in right finger(s): Secondary | ICD-10-CM | POA: Diagnosis not present

## 2016-07-10 MED ORDER — HYDROCODONE-ACETAMINOPHEN 5-325 MG PO TABS: ORAL_TABLET | ORAL | 0 refills | Status: DC

## 2016-07-10 NOTE — ED Triage Notes (Signed)
Right thumb slammed in car door yesterday. Patient stuck a needle in the nail to relieve pressure some blood came out, still painful, throbbing 7/10

## 2016-07-10 NOTE — Discharge Instructions (Signed)
Continue to apply ice pack for 20 to 30 minutes, 3 to 4 times daily  Continue until pain and swelling decrease.  Apply Bacitracin ointment and bandage daily until pain resolves.  Call (or follow-up with family doctor) for increasing redness, pain, swelling.  Wear splint whenever possible.

## 2016-07-10 NOTE — ED Provider Notes (Signed)
[facility]    CSN: [id number] Arrival date & time: 07/10/16  [id number]     History   Chief Complaint Chief Complaint  Patient presents with  . Finger Injury    HPI Robert Frey is a 56 y.o. male.   Patient closed his right thumb tip in car door yesterday.  He tried to relieve some of the pain underneath his fingernail by drilling a small hole in base of nail.  His last Tdap was 3 years ago.   The history is provided by the patient.  Hand Pain  This is a new problem. The current episode started yesterday. The problem occurs constantly. The problem has not changed since onset.Exacerbated by: Contact with fingertip. Nothing relieves the symptoms. Treatments tried: Drilling hole at base of fingernail. The treatment provided mild relief.    Past Medical History:  Diagnosis Date  . Ankylosing spondylitis ([place])   . COPD (chronic obstructive pulmonary disease) ([place])   . Diabetes mellitus without complication [facility])     Patient Active Problem List   Diagnosis Date Noted  . Bruising 06/09/2015  . Essential hypertension, benign 06/08/2015  . Microalbuminuria 06/08/2015  . Lung nodule < 6cm on CT 05/15/2014  . Heel spur 01/20/2014  . Osteoarthritis of foot, left 01/20/2014  . Psoriasis 10/11/2012  . Type II diabetes mellitus, uncontrolled ([place]) 10/11/2012  . ANKYLOSING SPONDYLITIS 09/30/2009  . KNEE PAIN, BILATERAL 12/28/2006  . ELEVATED BLOOD PRESSURE WITHOUT DIAGNOSIS OF HYPERTENSION 08/29/2006  . HYPERCHOLESTEROLEMIA 03/06/2006  . OBESITY, NOS 03/06/2006  . OBSESSIVE COMPUL. DISORDER 03/06/2006    Past Surgical History:  Procedure Laterality Date  . APPENDECTOMY    . CARPAL TUNNEL RELEASE Right   . ent surgery    . TONSILLECTOMY AND ADENOIDECTOMY         Home Medications    Prior to Admission medications   Medication Sig Start Date End Date Taking? Authorizing Provider  HYDROcodone-acetaminophen (NORCO/VICODIN) 5-325 MG tablet Take one by mouth at  bedtime as needed for pain 07/10/16   Kandra Nicolas, MD  imipramine (TOFRANIL) 50 MG tablet Take 200 mg by mouth at bedtime.    Historical Provider, MD  imipramine (TOFRANIL) 50 MG tablet TAKE 4 TABLETS (200 MG TOTAL) BY MOUTH AT BEDTIME. 04/10/16   Jade L Breeback, PA-C  metFORMIN (GLUCOPHAGE) 1000 MG tablet Take 1 tablet (1,000 mg total) by mouth 2 (two) times daily with a meal. 06/08/15   Donella Stade, PA-C    Family History Family History  Problem Relation Age of Onset  . Stroke Other   . Alcohol abuse Other   . Depression Other   . Hypertension Other   . Hyperlipidemia Other   . Diabetes Mother     Social History Social History  Substance Use Topics  . Smoking status: Former Smoker    Quit date: 08/13/2004  . Smokeless tobacco: Never Used  . Alcohol use No     Allergies   Green dyes and Zithromax [azithromycin dihydrate]   Review of Systems Review of Systems  All other systems reviewed and are negative.    Physical Exam Triage Vital Signs ED Triage Vitals  Enc Vitals Group     BP 07/10/16 0841 153/99     Pulse Rate 07/10/16 0841 92     Resp --      Temp 07/10/16 0841 97.8 F (36.6 C)     Temp Source 07/10/16 0841 Oral     SpO2 07/10/16 0841 96 %  Weight 07/10/16 0842 224 lb (101.6 kg)     Height 07/10/16 0842 6' (1.829 m)     Head Circumference --      Peak Flow --      Pain Score 07/10/16 0845 7     Pain Loc --      Pain Edu? --      Excl. in [place]? --    No data found.   Updated Vital Signs BP 153/99 (BP Location: Right Arm)   Pulse 92   Temp 97.8 F (36.6 C) (Oral)   Ht 6' (1.829 m)   Wt 224 lb (101.6 kg)   SpO2 96%   BMI 30.38 kg/m   Visual Acuity Right Eye Distance:   Left Eye Distance:   Bilateral Distance:    Right Eye Near:   Left Eye Near:    Bilateral Near:     Physical Exam  Constitutional: He appears well-developed and well-nourished. No distress.  HENT:  Head: Normocephalic.  Eyes: Pupils are equal, round, and  reactive to light.  Cardiovascular: Normal rate.   Pulmonary/Chest: Effort normal.  Musculoskeletal:       Right hand: He exhibits decreased range of motion, tenderness, bony tenderness and swelling. He exhibits normal two-point discrimination, normal capillary refill, no deformity and no laceration. Normal sensation noted.       Hands: Right thumb distal phalanx and DIP joint tender to palpation, with minimal swelling.  Flexion/extension at DIP joint intact.  Distal neurovascular function is intact.  Thumbnail has minimal subungual hematoma present (appears to have already drained through small hole created by patient).  Neurological: He is alert.  Skin: Skin is warm and dry.  Nursing note and vitals reviewed.    UC Treatments / Results  Labs (all labs ordered are listed, but only abnormal results are displayed) Labs Reviewed - No data to display  EKG  EKG Interpretation None       Radiology Dg Finger Thumb Right  Result Date: 07/10/2016 CLINICAL DATA:  56 year old male status post blunt trauma to the right thumb last night, slammed in car door. Pain and bruising. Initial encounter. EXAM: RIGHT THUMB 2+V COMPARISON:  None. FINDINGS: Bone mineralization is within normal limits. Visible carpal bones and the first metacarpal appear intact and within normal limits. At the lateral aspect of the right thumb IP joint there is a tiny ossific fragment which appears chronic and is probably degenerative in nature. No right thumb phalanx fracture or dislocation. Other visible osseous structures in the right hand appear intact. IMPRESSION: No acute fracture or dislocation identified about the right thumb. Electronically Signed   By: Genevie Ann M.D.   On: 07/10/2016 09:29    Procedures Procedures (including critical care time)  Medications Ordered in UC Medications - No data to display   Initial Impression / Assessment and Plan / UC Course  I have reviewed the triage vital signs and the  nursing notes.  Pertinent labs & imaging results that were available during my care of the patient were reviewed by me and considered in my medical decision making (see chart for details).    Stack splint applied.  Applied Bacitracin/bandaid. Continue to apply ice pack for 20 to 30 minutes, 3 to 4 times daily  Continue until pain and swelling decrease.  Apply Bacitracin ointment and bandage daily until pain resolves.  Call (or follow-up with family doctor) for increasing redness, pain, swelling.  Wear splint whenever possible.    Final Clinical Impressions(s) / UC  Diagnoses   Final diagnoses:  Contusion of thumb (nail), right, initial encounter  Subungual hematoma of right thumb, initial encounter    New Prescriptions New Prescriptions   HYDROCODONE-ACETAMINOPHEN (NORCO/VICODIN) 5-325 MG TABLET    Take one by mouth at bedtime as needed for pain     Kandra Nicolas, MD 07/10/16 [id number]

## 2016-08-31 IMAGING — CT CT CERVICAL SPINE W/O CM
1 series · 12 of 14 positions shown, 15 images · non-contrast
Comparison: None.

CLINICAL DATA: Fall.  Hit forehead on concrete

EXAM:
CT HEAD WITHOUT CONTRAST
CT CERVICAL SPINE WITHOUT CONTRAST
TECHNIQUE: Multidetector CT imaging of the head and cervical spine was
performed following the standard protocol without intravenous
contrast. Multiplanar CT image reconstructions of the cervical spine
were also generated.

[Series 2: head 5.0 h30s · axial · 0.46mm/px · z∈[-49,+76]mm · 12 of 31 slices shown, 15 images]
[im 3/31  soft-tissue]
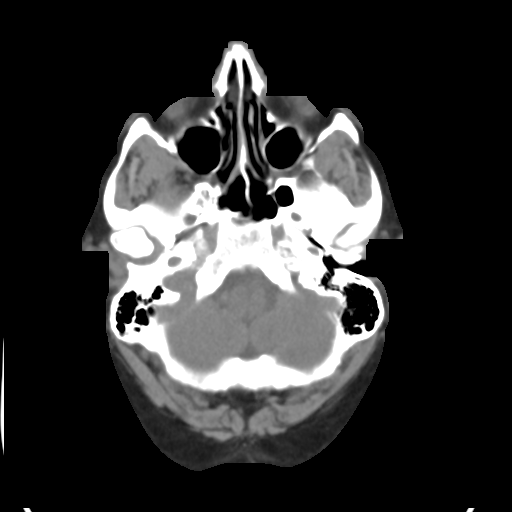
[im 3/31  bone]
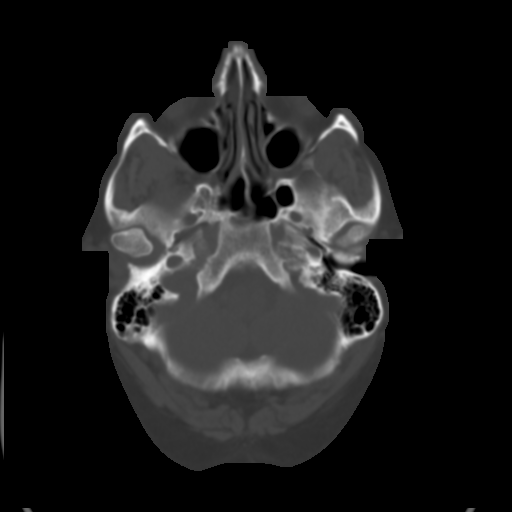
[im 5/31  bone]
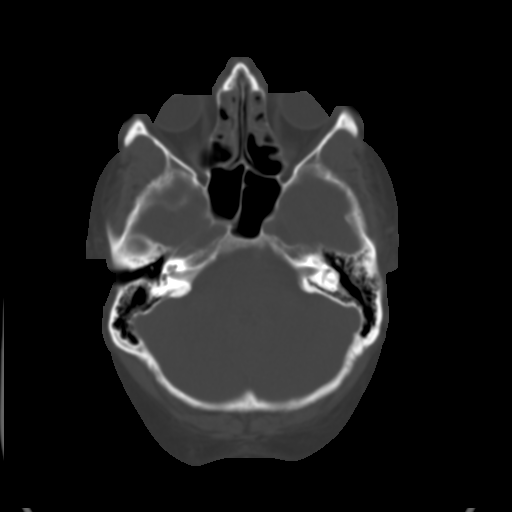
[im 7/31  bone]
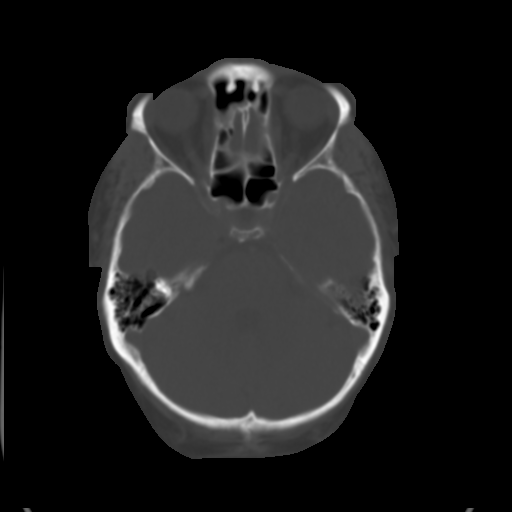
[im 10/31  bone]
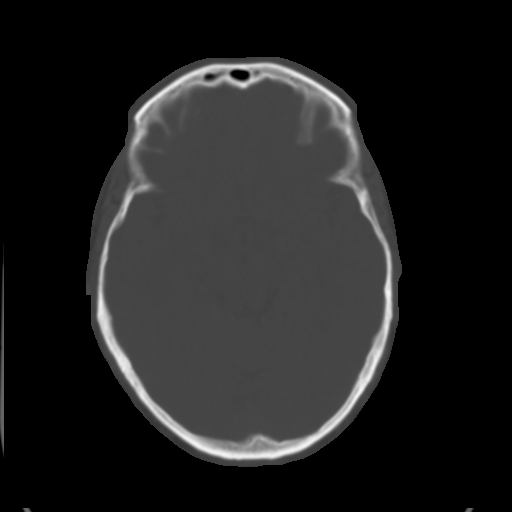
[im 12/31  soft-tissue]
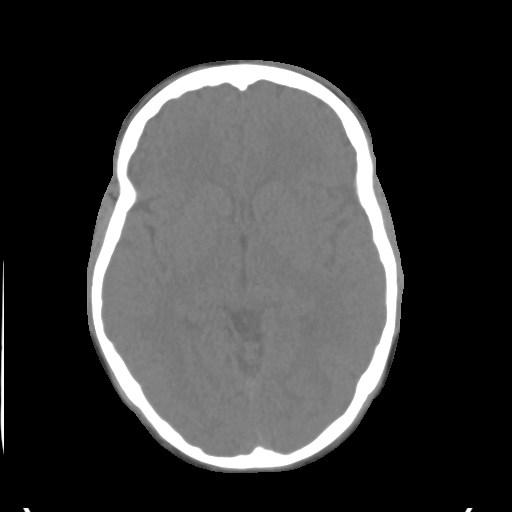
[im 12/31  bone]
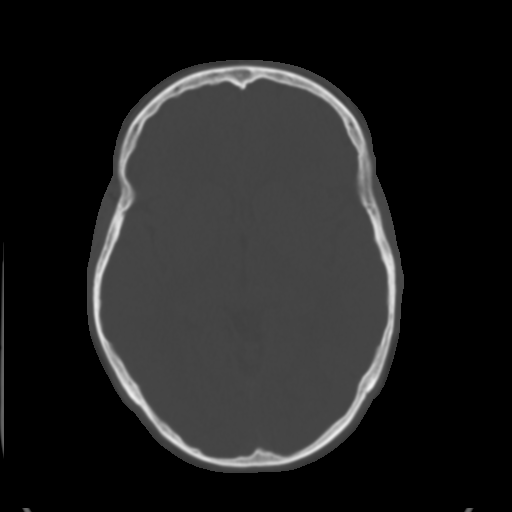
[im 14/31  bone]
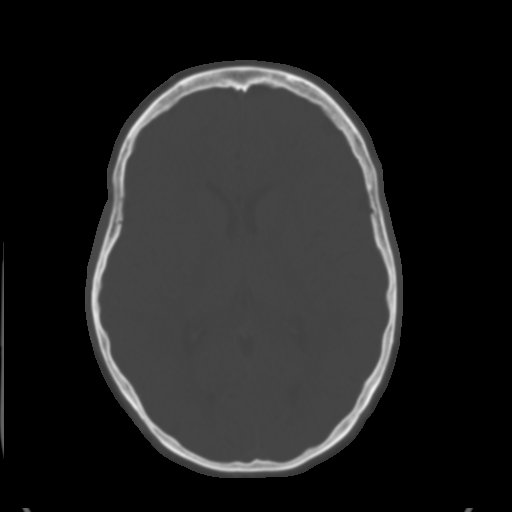
[im 17/31  bone]
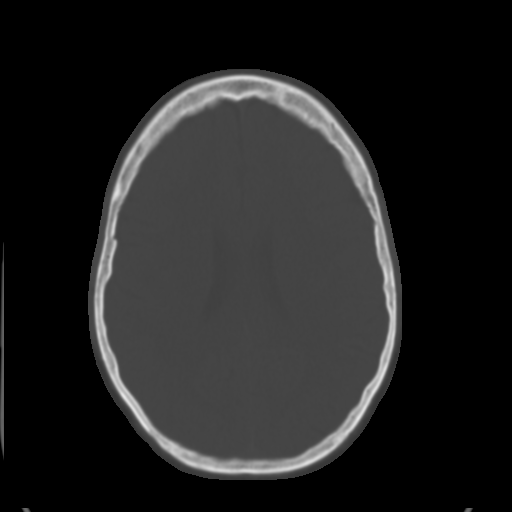
[im 19/31  bone]
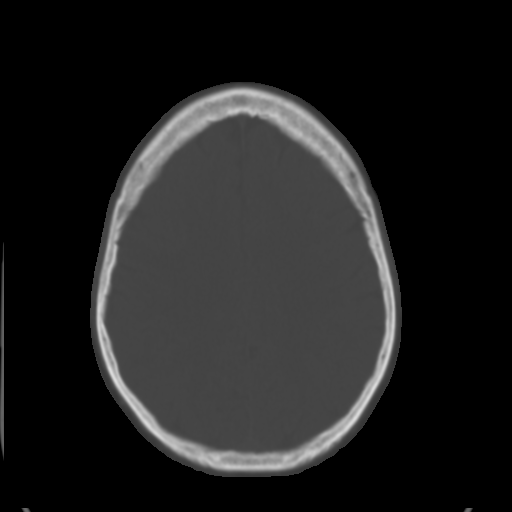
[im 21/31  soft-tissue]
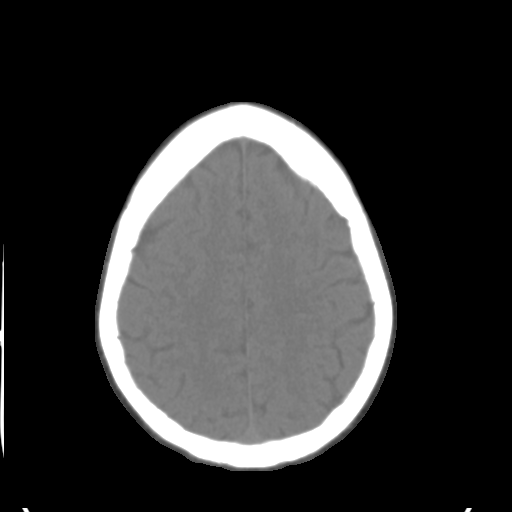
[im 21/31  bone]
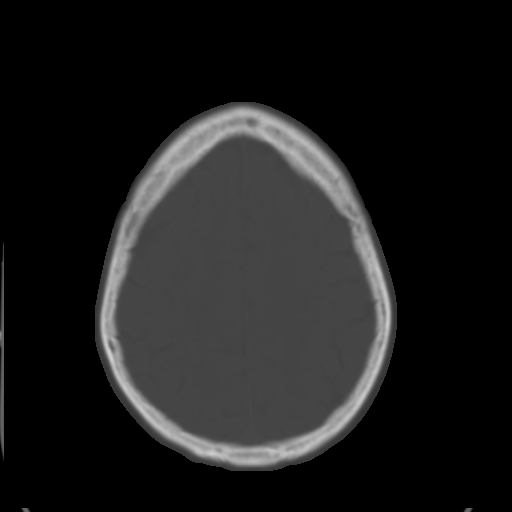
[im 24/31  bone]
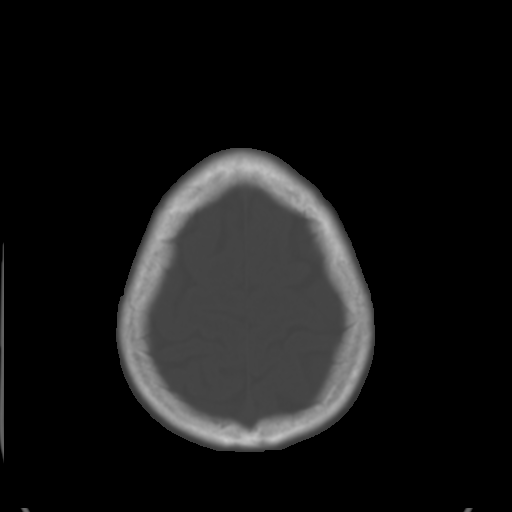
[im 26/31  bone]
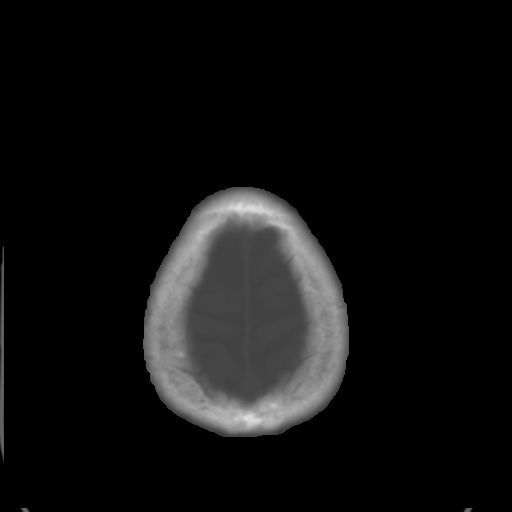
[im 28/31  bone]
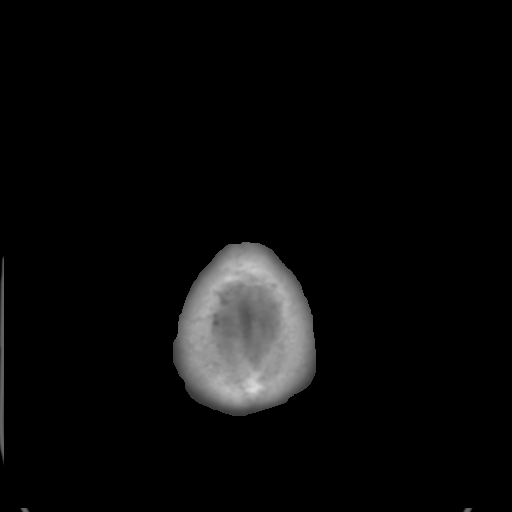

[12 of 14 positions shown; findings below may reference images not displayed]

FINDINGS: CT HEAD FINDINGS

No acute cortical infarct, hemorrhage, or mass lesion ispresent.
Ventricles are of normal size. No significant extra-axial fluid
collection is present. There is partial opacification of the ethmoid
air cells and frontal sinus. The mastoid air cells are clear. Left
frontal scalp soft tissue swelling noted. No underlying skull
fracture. The osseous skull is intact.

CT CERVICAL SPINE FINDINGS

There is straightening of normal cervical lordosis. The vertebral
body heights are well preserved. There is mild multi level disc
space narrowing and ventral endplate spurring. The facet joints are
all well aligned.
IMPRESSION: 1. No acute intracranial abnormalities.
2. No evidence for cervical spine fracture or dislocation.
3. Cervical degenerative disc disease.
4. Ethmoid air cell and frontal sinus disease.

## 2016-09-04 ENCOUNTER — Other Ambulatory Visit: Payer: Self-pay | Admitting: Physician Assistant

## 2016-09-04 ENCOUNTER — Other Ambulatory Visit: Payer: Self-pay | Admitting: *Deleted

## 2016-10-06 ENCOUNTER — Other Ambulatory Visit: Payer: Self-pay | Admitting: *Deleted

## 2016-10-06 MED ORDER — IMIPRAMINE HCL 50 MG PO TABS: 200.0000 mg | ORAL_TABLET | Freq: Every day | ORAL | 2 refills | Status: DC

## 2016-11-06 ENCOUNTER — Encounter: Payer: Self-pay | Admitting: Osteopathic Medicine

## 2016-11-06 ENCOUNTER — Ambulatory Visit (INDEPENDENT_AMBULATORY_CARE_PROVIDER_SITE_OTHER): Payer: Managed Care, Other (non HMO) | Admitting: Osteopathic Medicine

## 2016-11-06 VITALS — BP 157/82 | HR 98 | Wt 215.0 lb

## 2016-11-06 DIAGNOSIS — E119 Type 2 diabetes mellitus without complications: Secondary | ICD-10-CM

## 2016-11-06 DIAGNOSIS — I10 Essential (primary) hypertension: Secondary | ICD-10-CM | POA: Diagnosis not present

## 2016-11-06 DIAGNOSIS — R740 Nonspecific elevation of levels of transaminase and lactic acid dehydrogenase [LDH]: Secondary | ICD-10-CM | POA: Diagnosis not present

## 2016-11-06 DIAGNOSIS — R7401 Elevation of levels of liver transaminase levels: Secondary | ICD-10-CM

## 2016-11-06 DIAGNOSIS — E871 Hypo-osmolality and hyponatremia: Secondary | ICD-10-CM | POA: Diagnosis not present

## 2016-11-06 LAB — POCT GLYCOSYLATED HEMOGLOBIN (HGB A1C): Hemoglobin A1C: 13.1

## 2016-11-06 MED ORDER — PEN NEEDLES 32G X 4 MM MISC: 1.0000 [IU] | Freq: Every day | 99 refills | Status: AC

## 2016-11-06 MED ORDER — LOSARTAN POTASSIUM 50 MG PO TABS: 50.0000 mg | ORAL_TABLET | Freq: Every day | ORAL | 1 refills | Status: DC

## 2016-11-06 MED ORDER — INSULIN GLARGINE 100 UNIT/ML SOLOSTAR PEN: 10.0000 [IU] | PEN_INJECTOR | Freq: Every day | SUBCUTANEOUS | 99 refills | Status: DC

## 2016-11-06 NOTE — Progress Notes (Signed)
HPI: Robert Frey is a 56 y.o. male  who presents to [facility] today, 11/06/16,  for chief complaint of:  Chief Complaint  Patient presents with  . Follow-up    DIABETES    Diabetes: Has been sometime since patient is followed up on this issue. Has been noticing some increased fatigue and weight loss. Has been compliant with metformin, in fact increasing dose lately but sugars at home are still running in the 400s.  Hypertension: Not at goal, non-no medications right now. No chest pain, pressure, shortness of breath.   Past medical history, surgical history, social history and family history reviewed.  Patient Active Problem List   Diagnosis Date Noted  . Bruising 06/09/2015  . Essential hypertension, benign 06/08/2015  . Microalbuminuria 06/08/2015  . Lung nodule < 6cm on CT 05/15/2014  . Heel spur 01/20/2014  . Osteoarthritis of foot, left 01/20/2014  . Psoriasis 10/11/2012  . Type II diabetes mellitus, uncontrolled ([place]) 10/11/2012  . ANKYLOSING SPONDYLITIS 09/30/2009  . KNEE PAIN, BILATERAL 12/28/2006  . ELEVATED BLOOD PRESSURE WITHOUT DIAGNOSIS OF HYPERTENSION 08/29/2006  . HYPERCHOLESTEROLEMIA 03/06/2006  . OBESITY, NOS 03/06/2006  . OBSESSIVE COMPUL. DISORDER 03/06/2006    Current medication list and allergy/intolerance information reviewed.   Current Outpatient Prescriptions on File Prior to Visit  Medication Sig Dispense Refill  . HYDROcodone-acetaminophen (NORCO/VICODIN) 5-325 MG tablet Take one by mouth at bedtime as needed for pain 7 tablet 0  . imipramine (TOFRANIL) 50 MG tablet Take 4 tablets (200 mg total) by mouth at bedtime. 360 tablet 2  . metFORMIN (GLUCOPHAGE) 1000 MG tablet Take 1 tablet (1,000 mg total) by mouth 2 (two) times daily with a meal. 60 tablet 5   No current facility-administered medications on file prior to visit.    Allergies  Allergen Reactions  . Green Dyes Hives  . Zithromax [Azithromycin  Dihydrate] Rash      Review of Systems:  Constitutional: No recent illness, + generalized fatigue   HEENT: No  headache, no vision change  Cardiac: No  chest pain, No  pressure, No palpitations  Respiratory:  No  shortness of breath. No  Cough  Musculoskeletal: No new myalgia/arthralgia  Skin: No  Rash  Neurologic: No  weakness, No  Dizziness   Exam:  BP (!) 157/82   Pulse 98   Wt 215 lb (97.5 kg)   BMI 29.16 kg/m   Constitutional: VS see above. General Appearance: alert, well-developed, well-nourished, NAD  Eyes: Normal lids and conjunctive, non-icteric sclera  Ears, Nose, Mouth, Throat: MMM, Normal external inspection ears/nares/mouth/lips/gums.  Neck: No masses, trachea midline.   Respiratory: Normal respiratory effort. no wheeze, no rhonchi, no rales  Cardiovascular: S1/S2 normal, no murmur, no rub/gallop auscultated. RRR.   Musculoskeletal: Gait normal. Symmetric and independent movement of all extremities  Neurological: Normal balance/coordination. No tremor.  Skin: warm, dry, intact.   Psychiatric: Normal judgment/insight. Normal mood and affect. Oriented x3.    Recent Results (from the past 2160 hour(s))  POCT HgB A1C     Status: None   Collection Time: 11/06/16  3:40 PM  Result Value Ref Range   Hemoglobin A1C 13.1      ASSESSMENT/PLAN:   A1c markedly elevated compared to previous readings greater than 1 year ago. At this point, with maximization of lifestyle changes and marketed worsening of A1c, will go ahead and start long-acting insulin in addition to maintenance of metformin and lifestyle modifications  Diabetes mellitus without complication ([place]) -  Plan: POCT HgB A1C, Insulin Glargine (LANTUS SOLOSTAR) 100 UNIT/ML Solostar Pen, Insulin Pen Needle (PEN NEEDLES) 32G X 4 MM MISC, CBC, COMPLETE METABOLIC PANEL WITH GFR, TSH, Microalbumin / creatinine urine ratio, Urinalysis  Essential hypertension, benign - Plan: losartan (COZAAR) 50 MG  tablet    Patient Instructions  For Diabetes  Continue Metformin as you are taking it  Measure fasting blood sugar every day: goal for now is to get this to 80-130  Increase daily Insulin dose by 3 units at a time, twice per week, until fasting sugars are consistently 80-130, then continue at that dose  Plan to recheck A1C in 3 months  Plan to follow-up in the office sooner if you experience low sugars   Plan to follow-up in the office sooner if your fasting sugars are consistently high  Bring all sugar readings with you to your office visits     Follow-up plan: Return in about 2 weeks (around 11/20/2016) for follow-up diabetes & blood pressure .  Visit summary with medication list and pertinent instructions was printed for patient to review, alert [place] if any changes needed. All questions at time of visit were answered - patient instructed to contact office with any additional concerns. ER/RTC precautions were reviewed with the patient and understanding verbalized.

## 2016-11-06 NOTE — Patient Instructions (Signed)
For Diabetes  Continue Metformin as you are taking it  Measure fasting blood sugar every day: goal for now is to get this to 80-130  Increase daily Insulin dose by 3 units at a time, twice per week, until fasting sugars are consistently 80-130, then continue at that dose  Plan to recheck A1C in 3 months  Plan to follow-up in the office sooner if you experience low sugars   Plan to follow-up in the office sooner if your fasting sugars are consistently high  Bring all sugar readings with you to your office visits  

## 2016-11-07 ENCOUNTER — Telehealth: Payer: Self-pay | Admitting: Osteopathic Medicine

## 2016-11-07 LAB — COMPLETE METABOLIC PANEL WITH GFR
ALBUMIN: 3.9 g/dL (ref 3.6–5.1)
ALK PHOS: 108 U/L (ref 40–115)
ALT: 82 U/L — ABNORMAL HIGH (ref 9–46)
AST: 53 U/L — ABNORMAL HIGH (ref 10–35)
BILIRUBIN TOTAL: 0.4 mg/dL (ref 0.2–1.2)
BUN: 10 mg/dL (ref 7–25)
CALCIUM: 9.1 mg/dL (ref 8.6–10.3)
CO2: 25 mmol/L (ref 20–31)
Chloride: 94 mmol/L — ABNORMAL LOW (ref 98–110)
Creat: 0.92 mg/dL (ref 0.70–1.33)
Glucose, Bld: 238 mg/dL — ABNORMAL HIGH (ref 65–99)
POTASSIUM: 4.4 mmol/L (ref 3.5–5.3)
SODIUM: 131 mmol/L — AB (ref 135–146)
TOTAL PROTEIN: 7.1 g/dL (ref 6.1–8.1)

## 2016-11-07 LAB — CBC
HCT: 48.4 % (ref 38.5–50.0)
HEMOGLOBIN: 16.7 g/dL (ref 13.2–17.1)
MCH: 33.5 pg — AB (ref 27.0–33.0)
MCHC: 34.5 g/dL (ref 32.0–36.0)
MCV: 97.2 fL (ref 80.0–100.0)
MPV: 9.1 fL (ref 7.5–12.5)
Platelets: 200 10*3/uL (ref 140–400)
RBC: 4.98 MIL/uL (ref 4.20–5.80)
RDW: 13.9 % (ref 11.0–15.0)
WBC: 8.7 10*3/uL (ref 3.8–10.8)

## 2016-11-07 LAB — TSH: TSH: 2.22 m[IU]/L (ref 0.40–4.50)

## 2016-11-07 NOTE — Telephone Encounter (Signed)
I clicked the wrong button at his checkout for follow-up plan, meant to have him come back in 2 weeks not 2 months, can we call him and make sure that he is on my schedule for this for follow-up diabetes and blood pressure? Thanks!

## 2016-11-08 ENCOUNTER — Other Ambulatory Visit: Payer: Self-pay | Admitting: Physician Assistant

## 2016-11-08 ENCOUNTER — Telehealth: Payer: Self-pay | Admitting: *Deleted

## 2016-11-08 LAB — URINALYSIS
BILIRUBIN URINE: NEGATIVE
HGB URINE DIPSTICK: NEGATIVE
KETONES UR: NEGATIVE
LEUKOCYTES UA: NEGATIVE
Nitrite: NEGATIVE
PH: 5.5 (ref 5.0–8.0)
Specific Gravity, Urine: 1.029 (ref 1.001–1.035)

## 2016-11-08 LAB — MICROALBUMIN / CREATININE URINE RATIO
CREATININE, URINE: 137 mg/dL (ref 20–370)
MICROALB UR: 9.7 mg/dL
MICROALB/CREAT RATIO: 71 ug/mg{creat} — AB (ref <30)

## 2016-11-08 MED ORDER — INSULIN GLARGINE 300 UNIT/ML ~~LOC~~ SOPN: 10.0000 [IU] | PEN_INJECTOR | Freq: Every day | SUBCUTANEOUS | 0 refills | Status: DC

## 2016-11-08 NOTE — Telephone Encounter (Signed)
Patient notified.2 toujeo samples in our fridge with directions printed out and attatched to sample. Patient will come and pick them up.

## 2016-11-08 NOTE — Progress Notes (Signed)
Pt is on toujeo not lantus. He comes in wanting samples. Given 2 samples today.

## 2016-11-08 NOTE — Telephone Encounter (Signed)
I called patient and we scheduled him a 2 week fu. Thanks

## 2016-11-08 NOTE — Telephone Encounter (Signed)
Thank you so much for taking care of this.

## 2016-11-08 NOTE — Addendum Note (Signed)
Addended by: Maryla Morrow on: 11/08/2016 07:49 AM   Modules accepted: Orders

## 2016-11-08 NOTE — Telephone Encounter (Signed)
The lantus written for this patient requires a PA. Of course this will be a process with the insurance company. We have samples of Toujeo but not lantus. Since they are both in the long acting family of insulins can patient use toujeo samples while we are trying to get lantus approved?

## 2016-11-08 NOTE — Telephone Encounter (Signed)
Excellent plan! Patient can come to pick this up, I'll write the orders in the chart but dosing is same as Lantus. I'd be fine with Levemir too if this is something insurance will cover without PA. Thanks!

## 2016-11-09 ENCOUNTER — Telehealth: Payer: Self-pay | Admitting: *Deleted

## 2016-11-09 NOTE — Telephone Encounter (Signed)
Pre Authorization sent to cover my meds. [id number] - PA Case ID: [id number]

## 2016-11-10 ENCOUNTER — Other Ambulatory Visit: Payer: Self-pay | Admitting: Osteopathic Medicine

## 2016-11-10 ENCOUNTER — Other Ambulatory Visit: Payer: Self-pay

## 2016-11-10 ENCOUNTER — Ambulatory Visit: Payer: Self-pay | Admitting: Osteopathic Medicine

## 2016-11-10 MED ORDER — INSULIN GLARGINE 100 UNIT/ML SOLOSTAR PEN: PEN_INJECTOR | SUBCUTANEOUS | 99 refills | Status: DC

## 2016-11-10 NOTE — Progress Notes (Signed)
Levemir not covered, switch to Lantus - patient was given Toujeo sample to hold him over, will leave this on med list for now

## 2016-11-15 MED ORDER — INSULIN DETEMIR 100 UNIT/ML FLEXPEN: PEN_INJECTOR | SUBCUTANEOUS | 11 refills | Status: DC

## 2016-11-15 NOTE — Telephone Encounter (Signed)
OK. New Rx is in.

## 2016-11-15 NOTE — Telephone Encounter (Signed)
Ridiculous. I'll place alternative Rx order - looks like basaglar, levemir and tresiba are covered?

## 2016-11-15 NOTE — Telephone Encounter (Signed)
Unfortunately Lantus was denied. Insurance wants documented contraindication or intolerance to [employer], or [place].

## 2016-11-15 NOTE — Telephone Encounter (Signed)
Yep. According to the letter I received they want him to have tried and failed those medications before they will consider coverage for lantus

## 2016-11-21 ENCOUNTER — Encounter: Payer: Self-pay | Admitting: Osteopathic Medicine

## 2016-11-21 ENCOUNTER — Ambulatory Visit (INDEPENDENT_AMBULATORY_CARE_PROVIDER_SITE_OTHER): Payer: Managed Care, Other (non HMO) | Admitting: Osteopathic Medicine

## 2016-11-21 VITALS — BP 125/75 | HR 86 | Ht 71.0 in | Wt 216.0 lb

## 2016-11-21 DIAGNOSIS — Z794 Long term (current) use of insulin: Secondary | ICD-10-CM | POA: Diagnosis not present

## 2016-11-21 DIAGNOSIS — I10 Essential (primary) hypertension: Secondary | ICD-10-CM | POA: Diagnosis not present

## 2016-11-21 DIAGNOSIS — R809 Proteinuria, unspecified: Secondary | ICD-10-CM

## 2016-11-21 DIAGNOSIS — R748 Abnormal levels of other serum enzymes: Secondary | ICD-10-CM

## 2016-11-21 DIAGNOSIS — E1129 Type 2 diabetes mellitus with other diabetic kidney complication: Secondary | ICD-10-CM

## 2016-11-21 DIAGNOSIS — E119 Type 2 diabetes mellitus without complications: Secondary | ICD-10-CM

## 2016-11-21 NOTE — Progress Notes (Signed)
HPI: Robert Frey is a 56 y.o. male  who presents to [place] today, 11/21/16,  for chief complaint of:  Chief Complaint  Patient presents with  . Follow-up    BLOOD PRESSURE    Diabetes: Recently started insulin. He is doing well on this medication, no hypoglycemic symptoms. He has titrated up on this rather quickly from 10 units all the way up to 30, fasting sugars are typically in the 180s.  Hypertension: No chest pain, pressure, shortness of breath. No home blood pressures to report. Blood pressure on recheck is within oh range  Elevated liver enzymes: Patient not sure about whether or not he had alcohol prior to measurement of liver enzymes. No history of IV drug abuse or tattoos or blood transfusion   Past medical history, surgical history, social history and family history reviewed.  Patient Active Problem List   Diagnosis Date Noted  . Bruising 06/09/2015  . Essential hypertension, benign 06/08/2015  . Microalbuminuria 06/08/2015  . Lung nodule < 6cm on CT 05/15/2014  . Heel spur 01/20/2014  . Osteoarthritis of foot, left 01/20/2014  . Psoriasis 10/11/2012  . Type II diabetes mellitus, uncontrolled ([place]) 10/11/2012  . ANKYLOSING SPONDYLITIS 09/30/2009  . KNEE PAIN, BILATERAL 12/28/2006  . ELEVATED BLOOD PRESSURE WITHOUT DIAGNOSIS OF HYPERTENSION 08/29/2006  . HYPERCHOLESTEROLEMIA 03/06/2006  . OBESITY, NOS 03/06/2006  . OBSESSIVE COMPUL. DISORDER 03/06/2006    Current medication list and allergy/intolerance information reviewed.   Current Outpatient Prescriptions on File Prior to Visit  Medication Sig Dispense Refill  . imipramine (TOFRANIL) 50 MG tablet Take 4 tablets (200 mg total) by mouth at bedtime. 360 tablet 2  . Insulin Detemir (LEVEMIR FLEXPEN) 100 UNIT/ML Pen Inject 10 Units into the skin daily in evenings. Increase as directed up to 60 Units max - can start Levemir (this medicine) when Toujeo sample pen is out 15 mL 11   . Insulin Glargine (LANTUS SOLOSTAR) 100 UNIT/ML Solostar Pen Inject 10 Units into the skin daily in evenings. Increase as directed up to 60 Units max - can start Lantus (this medicine) when Toujeo sample pen is out 5 pen PRN  . Insulin Glargine (TOUJEO SOLOSTAR) 300 UNIT/ML SOPN Inject 10 Units into the skin daily. Inject 10 Units into the skin daily. Increase as directed up to 60 Units max 2 pen 0  . Insulin Pen Needle (PEN NEEDLES) 32G X 4 MM MISC Inject 1 Units into the skin daily. 100 each 99  . losartan (COZAAR) 50 MG tablet Take 1 tablet (50 mg total) by mouth daily. 90 tablet 1  . metFORMIN (GLUCOPHAGE) 1000 MG tablet Take 1 tablet (1,000 mg total) by mouth 2 (two) times daily with a meal. 60 tablet 5   No current facility-administered medications on file prior to visit.    Allergies  Allergen Reactions  . Green Dyes Hives  . Zithromax [Azithromycin Dihydrate] Rash      Review of Systems:  Constitutional: No recent illness  Cardiac: No  chest pain, No  pressure,   Respiratory:  No  shortness of breath.  Neurologic: No  weakness, No  Dizziness    Exam:  BP 125/75   Pulse 86   Ht 5\' 11"  (1.803 m)   Wt 216 lb (98 kg)   BMI 30.13 kg/m   Constitutional: VS see above. General Appearance: alert, well-developed, well-nourished, NAD  Eyes: Normal lids and conjunctive, non-icteric sclera  Ears, Nose, Mouth, Throat: MMM, Normal external inspection ears/nares/mouth/lips/gums.  Neck: No masses, trachea midline.   Respiratory: Normal respiratory effort. no wheeze, no rhonchi, no rales  Cardiovascular: S1/S2 normal, no murmur, no rub/gallop auscultated. RRR.   Musculoskeletal: Gait normal. Symmetric and independent movement of all extremities  Neurological: Normal balance/coordination. No tremor.  Skin: warm, dry, intact.   Psychiatric: Normal judgment/insight. Normal mood and affect. Oriented x3.    Recent Results (from the past 2160 hour(s))  CBC     Status:  Abnormal   Collection Time: 11/06/16  8:40 AM  Result Value Ref Range   WBC 8.7 3.8 - 10.8 K/uL   RBC 4.98 4.20 - 5.80 MIL/uL   Hemoglobin 16.7 13.2 - 17.1 g/dL   HCT 48.4 38.5 - 50.0 %   MCV 97.2 80.0 - 100.0 fL   MCH 33.5 (H) 27.0 - 33.0 pg   MCHC 34.5 32.0 - 36.0 g/dL   RDW 13.9 11.0 - 15.0 %   Platelets 200 140 - 400 K/uL   MPV 9.1 7.5 - 12.5 fL  COMPLETE METABOLIC PANEL WITH GFR     Status: Abnormal   Collection Time: 11/06/16  8:40 AM  Result Value Ref Range   Sodium 131 (L) 135 - 146 mmol/L   Potassium 4.4 3.5 - 5.3 mmol/L   Chloride 94 (L) 98 - 110 mmol/L   CO2 25 20 - 31 mmol/L   Glucose, Bld 238 (H) 65 - 99 mg/dL   BUN 10 7 - 25 mg/dL   Creat 0.92 0.70 - 1.33 mg/dL    Comment:   For patients > or = 54 years of age: The upper reference limit for Creatinine is approximately 13% higher for people identified as African-American.      Total Bilirubin 0.4 0.2 - 1.2 mg/dL   Alkaline Phosphatase 108 40 - 115 U/L   AST 53 (H) 10 - 35 U/L   ALT 82 (H) 9 - 46 U/L   Total Protein 7.1 6.1 - 8.1 g/dL   Albumin 3.9 3.6 - 5.1 g/dL   Calcium 9.1 8.6 - 10.3 mg/dL   GFR, Est African American >89 >=60 mL/min   GFR, Est Non African American >89 >=60 mL/min  TSH     Status: None   Collection Time: 11/06/16  8:40 AM  Result Value Ref Range   TSH 2.22 0.40 - 4.50 mIU/L  Microalbumin / creatinine urine ratio     Status: Abnormal   Collection Time: 11/06/16  8:40 AM  Result Value Ref Range   Creatinine, Urine 137 20 - 370 mg/dL   Microalb, Ur 9.7 Not estab mg/dL   Microalb Creat Ratio 71 (H) <30 mcg/mg creat    Comment: The ADA has defined abnormalities in albumin excretion as follows:           Category           Result                            (mcg/mg creatinine)                 Normal:    <30       Microalbuminuria:    30 - 299   Clinical albuminuria:    > or = 300   The ADA recommends that at least two of three specimens collected within a 3 - 6 month period be abnormal  before considering a patient to be within a diagnostic category.  Urinalysis     Status: Abnormal   Collection Time: 11/06/16  8:40 AM  Result Value Ref Range   Color, Urine YELLOW YELLOW   APPearance TURBID (A) CLEAR   Specific Gravity, Urine 1.029 1.001 - 1.035   pH 5.5 5.0 - 8.0   Glucose, UA 3+ (A) NEGATIVE   Bilirubin Urine NEGATIVE NEGATIVE   Ketones, ur NEGATIVE NEGATIVE   Hgb urine dipstick NEGATIVE NEGATIVE   Protein, ur 1+ (A) NEGATIVE   Nitrite NEGATIVE NEGATIVE   Leukocytes, UA NEGATIVE NEGATIVE  POCT HgB A1C     Status: None   Collection Time: 11/06/16  3:40 PM  Result Value Ref Range   Hemoglobin A1C 13.1     No results found.  No flowsheet data found.  No flowsheet data found.    ASSESSMENT/PLAN: Overall doing well, addition of statin/aspirin depending on labs, insulin dosing instructions reviewed, will further address eye exam/foot exam/pneumococcal vaccine at upcoming visit, smoking cessation reviewed  Type 2 diabetes mellitus with microalbuminuria, with long-term current use of insulin ([place]) - Has gone up on insulin dose fairly quickly, reviewed instructions for titration, hypoglycemic symptoms. Advised will likely need statin, aspirin - Plan: Lipid panel, COMPLETE METABOLIC PANEL WITH GFR  Essential hypertension, benign - Improved on antihypertensive medication - Plan: Lipid panel, COMPLETE METABOLIC PANEL WITH GFR  Elevated liver enzymes - Caution if we are considering statin use. Fatty liver seems most likely, labs pending, consider abdominal ultrasound, low risk for hepatitis      Follow-up plan: Return in about 3 months (around 02/21/2017) for recheck diabetes, sooner if needed based on labs .  Visit summary with medication list and pertinent instructions was printed for patient to review, alert [place] if any changes needed. All questions at time of visit were answered - patient instructed to contact office with any additional concerns. ER/RTC  precautions were reviewed with the patient and understanding verbalized.

## 2017-01-17 ENCOUNTER — Ambulatory Visit (INDEPENDENT_AMBULATORY_CARE_PROVIDER_SITE_OTHER): Payer: Managed Care, Other (non HMO) | Admitting: Family Medicine

## 2017-01-17 ENCOUNTER — Encounter: Payer: Self-pay | Admitting: Family Medicine

## 2017-01-17 VITALS — BP 156/81 | HR 102 | Wt 221.0 lb

## 2017-01-17 DIAGNOSIS — Z23 Encounter for immunization: Secondary | ICD-10-CM | POA: Diagnosis not present

## 2017-01-17 DIAGNOSIS — S39012A Strain of muscle, fascia and tendon of lower back, initial encounter: Secondary | ICD-10-CM | POA: Diagnosis not present

## 2017-01-17 MED ORDER — CYCLOBENZAPRINE HCL 10 MG PO TABS: 10.0000 mg | ORAL_TABLET | Freq: Three times a day (TID) | ORAL | 0 refills | Status: DC | PRN

## 2017-01-17 MED ORDER — TRAMADOL HCL 50 MG PO TABS: 50.0000 mg | ORAL_TABLET | Freq: Three times a day (TID) | ORAL | 0 refills | Status: DC | PRN

## 2017-01-17 NOTE — Patient Instructions (Signed)
Thank you for coming in today. Attend PT.  Use tramadol Sparingly.  Use flexeril as needed especially at bedtime.  Come back or go to the emergency room if you notice new weakness new numbness problems walking or bowel or bladder problems. Use a heating pad.   TENS UNIT: This is helpful for muscle pain and spasm.   Search and Purchase a TENS 7000 2nd edition at  www.tenspros.com or www.[employer].com It should be less than $30.     TENS unit instructions: Do not shower or bathe with the unit on Turn the unit off before removing electrodes or batteries If the electrodes lose stickiness add a drop of water to the electrodes after they are disconnected from the unit and place on plastic sheet. If you continued to have difficulty, call the TENS unit company to purchase more electrodes. Do not apply lotion on the skin area prior to use. Make sure the skin is clean and dry as this will help prolong the life of the electrodes. After use, always check skin for unusual red areas, rash or other skin difficulties. If there are any skin problems, does not apply electrodes to the same area. Never remove the electrodes from the unit by pulling the wires. Do not use the TENS unit or electrodes other than as directed. Do not change electrode placement without consultating your therapist or physician. Keep 2 fingers with between each electrode. Wear time ratio is 2:1, on to off times.    For example on for 30 minutes off for 15 minutes and then on for 30 minutes off for 15 minutes   Lumbosacral Strain Lumbosacral strain is an injury that causes pain in the lower back (lumbosacral spine). This injury usually occurs from overstretching the muscles or ligaments along your spine. A strain can affect one or more muscles or cord-like tissues that connect bones to other bones (ligaments). What are the causes? This condition may be caused by:  A hard, direct hit (blow) to the back.  Excessive stretching of  the lower back muscles. This may result from: ? A fall. ? Lifting something heavy. ? Repetitive movements such as bending or crouching.  What increases the risk? The following factors may increase your risk of getting this condition:  Participating in sports or activities that involve: ? A sudden twist of the back. ? Pushing or pulling motions.  Being overweight or obese.  Having poor strength and flexibility, especially tight hamstrings or weak muscles in the back or abdomen.  Having too much of a curve in the lower back.  Having a pelvis that is tilted forward.  What are the signs or symptoms? The main symptom of this condition is pain in the lower back, at the site of the strain. Pain may extend (radiate) down one or both legs. How is this diagnosed? This condition is diagnosed based on:  Your symptoms.  Your medical history.  A physical exam. ? Your health care provider may push on certain areas of your back to determine the source of your pain. ? You may be asked to bend forward, backward, and side to side to assess the severity of your pain and your range of motion.  Imaging tests, such as: ? X-rays. ? MRI.  How is this treated? Treatment for this condition may include:  Putting heat and cold on the affected area.  Medicines to help relieve pain and relax your muscles (muscle relaxants).  NSAIDs to help reduce swelling and discomfort.  When your  symptoms improve, it is important to gradually return to your normal routine as soon as possible to reduce pain, avoid stiffness, and avoid loss of muscle strength. Generally, symptoms should improve within 6 weeks of treatment. However, recovery time varies. Follow these instructions at home: Managing pain, stiffness, and swelling   If directed, put ice on the injured area during the first 24 hours after your strain. ? Put ice in a plastic bag. ? Place a towel between your skin and the bag. ? Leave the ice on for  20 minutes, 2-3 times a day.  If directed, put heat on the affected area as often as told by your health care provider. Use the heat source that your health care provider recommends, such as a moist heat pack or a heating pad. ? Place a towel between your skin and the heat source. ? Leave the heat on for 20-30 minutes. ? Remove the heat if your skin turns bright red. This is especially important if you are unable to feel pain, heat, or cold. You may have a greater risk of getting burned. Activity  Rest and return to your normal activities as told by your health care provider. Ask your health care provider what activities are safe for you.  Avoid activities that take a lot of energy for as long as told by your health care provider. General instructions  Take over-the-counter and prescription medicines only as told by your health care provider.  Donot drive or use heavy machinery while taking prescription pain medicine.  Do not use any products that contain nicotine or tobacco, such as cigarettes and e-cigarettes. If you need help quitting, ask your health care provider.  Keep all follow-up visits as told by your health care provider. This is important. How is this prevented?  Use correct form when playing sports and lifting heavy objects.  Use good posture when sitting and standing.  Maintain a healthy weight.  Sleep on a mattress with medium firmness to support your back.  Be safe and responsible while being active to avoid falls.  Do at least 150 minutes of moderate-intensity exercise each week, such as brisk walking or water aerobics. Try a form of exercise that takes stress off your back, such as swimming or stationary cycling.  Maintain physical fitness, including: ? Strength. ? Flexibility. ? Cardiovascular fitness. ? Endurance. Contact a health care provider if:  Your back pain does not improve after 6 weeks of treatment.  Your symptoms get worse. Get help right  away if:  Your back pain is severe.  You cannot stand or walk.  You have difficulty controlling when you urinate or when you have a bowel movement.  You feel nauseous or you vomit.  Your feet get very cold.  You have numbness, tingling, weakness, or problems using your arms or legs.  You develop any of the following: ? Shortness of breath. ? Dizziness. ? Pain in your legs. ? Weakness in your buttocks or legs. ? Discoloration of the skin on your toes or legs. This information is not intended to replace advice given to you by your health care provider. Make sure you discuss any questions you have with your health care provider. Document Released: 02/22/2005 Document Revised: 12/03/2015 Document Reviewed: 10/17/2015 Elsevier Interactive Patient Education  2017 [employer].

## 2017-01-17 NOTE — Progress Notes (Signed)
Robert Frey is a 56 y.o. male who presents to [place] today for back pain. Patient notes acute episode of back pain for 4-5 days. The pain is located in the right lower back worse with activity and also worse with prolonged rest. Pain is also present when he stands from a seated position. He denies any injury. He notes the pain is not consistent with prior episodes of ankylosing spondylitis. He works as a Sports coach at the [employer] He denies any radiating pain weakness or numbness fevers or chills. He feels well otherwise.   Past Medical History:  Diagnosis Date  . Ankylosing spondylitis ([place])   . COPD (chronic obstructive pulmonary disease) ([place])   . Diabetes mellitus without complication [facility])    Past Surgical History:  Procedure Laterality Date  . APPENDECTOMY    . CARPAL TUNNEL RELEASE Right   . ent surgery    . TONSILLECTOMY AND ADENOIDECTOMY     Social History  Substance Use Topics  . Smoking status: Former Smoker    Quit date: 08/13/2004  . Smokeless tobacco: Never Used  . Alcohol use No     ROS:  As above   Medications: Current Outpatient Prescriptions  Medication Sig Dispense Refill  . imipramine (TOFRANIL) 50 MG tablet Take 4 tablets (200 mg total) by mouth at bedtime. 360 tablet 2  . Insulin Detemir (LEVEMIR FLEXPEN) 100 UNIT/ML Pen Inject 10 Units into the skin daily in evenings. Increase as directed up to 60 Units max - can start Levemir (this medicine) when Toujeo sample pen is out 15 mL 11  . Insulin Glargine (TOUJEO SOLOSTAR) 300 UNIT/ML SOPN Inject 10 Units into the skin daily. Inject 10 Units into the skin daily. Increase as directed up to 60 Units max 2 pen 0  . Insulin Pen Needle (PEN NEEDLES) 32G X 4 MM MISC Inject 1 Units into the skin daily. 100 each 99  . losartan (COZAAR) 50 MG tablet Take 1 tablet (50 mg total) by mouth daily. 90 tablet 1  . metFORMIN (GLUCOPHAGE) 1000 MG tablet Take 1 tablet  (1,000 mg total) by mouth 2 (two) times daily with a meal. 60 tablet 5  . cyclobenzaprine (FLEXERIL) 10 MG tablet Take 1 tablet (10 mg total) by mouth 3 (three) times daily as needed for muscle spasms. 30 tablet 0  . traMADol (ULTRAM) 50 MG tablet Take 1 tablet (50 mg total) by mouth every 8 (eight) hours as needed. 12 tablet 0   No current facility-administered medications for this visit.    Allergies  Allergen Reactions  . Green Dyes Hives  . Zithromax [Azithromycin Dihydrate] Rash     Exam:  BP (!) 156/81   Pulse (!) 102   Wt 221 lb (100.2 kg)   BMI 30.82 kg/m  General: Well Developed, well nourished, and in no acute distress.  Neuro/Psych: Alert and oriented x3, extra-ocular muscles intact, able to move all 4 extremities, sensation grossly intact. Skin: Warm and dry, no rashes noted.  Respiratory: Not using accessory muscles, speaking in full sentences, trachea midline.  Cardiovascular: Pulses palpable, no extremity edema. Abdomen: Does not appear distended. MSK:  L spine: Nontender to spinal midline. Tender palpation right lumbar paraspinal muscle group. Normal back motion except for extension which produces pain. Additionally right lateral flexion produces pain. Lower extremity strength reflexes and sensation are equal and normal throughout. Non-antalgic gait present.  X-ray lumbar spine dated May 2012 reviewed  No results found for  this or any previous visit (from the past 48 hour(s)). No results found.    Assessment and Plan: 56 y.o. male with lumbosacral strain of the right low back. Likely myofascial disruption. Very doubtful for exacerbation of ankylosing spondylitis. We decided lengthy discussion about options. Plan for heating pad home exercise program Flexeril and low-dose tramadol intermittently sparingly. Most importantly plan to refer to physical therapy. Recheck in 6 weeks or sooner if needed.  Influenza vaccine given today prior to discharge.   Orders  Placed This Encounter  Procedures  . Flu Vaccine QUAD 36+ mos IM  . Ambulatory referral to Physical Therapy    Referral Priority:   Routine    Referral Type:   Physical Medicine    Referral Reason:   Specialty Services Required    Requested Specialty:   Physical Therapy   Meds ordered this encounter  Medications  . cyclobenzaprine (FLEXERIL) 10 MG tablet    Sig: Take 1 tablet (10 mg total) by mouth 3 (three) times daily as needed for muscle spasms.    Dispense:  30 tablet    Refill:  0  . traMADol (ULTRAM) 50 MG tablet    Sig: Take 1 tablet (50 mg total) by mouth every 8 (eight) hours as needed.    Dispense:  12 tablet    Refill:  0    Discussed warning signs or symptoms. Please see discharge instructions. Patient expresses understanding.

## 2017-01-30 ENCOUNTER — Ambulatory Visit: Payer: Self-pay | Admitting: Physical Therapy

## 2017-05-14 ENCOUNTER — Other Ambulatory Visit: Payer: Self-pay | Admitting: Osteopathic Medicine

## 2017-05-14 DIAGNOSIS — I10 Essential (primary) hypertension: Secondary | ICD-10-CM

## 2017-07-31 ENCOUNTER — Other Ambulatory Visit: Payer: Self-pay

## 2017-07-31 ENCOUNTER — Emergency Department (INDEPENDENT_AMBULATORY_CARE_PROVIDER_SITE_OTHER)
Admission: EM | Admit: 2017-07-31 | Discharge: 2017-07-31 | Disposition: A | Discharge status: Home / Self Care | Payer: Commercial Managed Care - PPO | Source: Home / Self Care

## 2017-07-31 DIAGNOSIS — J0120 Acute ethmoidal sinusitis, unspecified: Secondary | ICD-10-CM

## 2017-07-31 MED ORDER — AMOXICILLIN-POT CLAVULANATE 875-125 MG PO TABS: 1.0000 | ORAL_TABLET | Freq: Two times a day (BID) | ORAL | 0 refills | Status: DC

## 2017-07-31 NOTE — Discharge Instructions (Signed)
Return if any problems.

## 2017-07-31 NOTE — ED Provider Notes (Signed)
[name]    CSN: [id number] Arrival date & time: 07/31/17  0808     History   Chief Complaint Chief Complaint  Patient presents with  . Facial Pain  . Sore Throat  . Headache    HPI Robert Frey is a 57 y.o. male.   The history is provided by the patient. No language interpreter was used.  Sore Throat  This is a new problem. The current episode started yesterday. The problem occurs constantly. The problem has been gradually worsening. Associated symptoms include headaches. Nothing aggravates the symptoms. Nothing relieves the symptoms. He has tried nothing for the symptoms. The treatment provided no relief.  Headache   Pt complains of sinus pressure and congestion.  Pt reports colored drainage. Pt reports he has a histroy of sinus infections Past Medical History:  Diagnosis Date  . Ankylosing spondylitis ([place])   . COPD (chronic obstructive pulmonary disease) ([place])   . Diabetes mellitus without complication [facility])     Patient Active Problem List   Diagnosis Date Noted  . Lumbosacral strain 01/17/2017  . Bruising 06/09/2015  . Essential hypertension, benign 06/08/2015  . Microalbuminuria 06/08/2015  . Lung nodule < 6cm on CT 05/15/2014  . Heel spur 01/20/2014  . Osteoarthritis of foot, left 01/20/2014  . Psoriasis 10/11/2012  . Type 2 diabetes mellitus with microalbuminuria, with long-term current use of insulin ([place]) 10/11/2012  . ANKYLOSING SPONDYLITIS 09/30/2009  . KNEE PAIN, BILATERAL 12/28/2006  . ELEVATED BLOOD PRESSURE WITHOUT DIAGNOSIS OF HYPERTENSION 08/29/2006  . HYPERCHOLESTEROLEMIA 03/06/2006  . OBESITY, NOS 03/06/2006  . OBSESSIVE COMPUL. DISORDER 03/06/2006    Past Surgical History:  Procedure Laterality Date  . APPENDECTOMY    . CARPAL TUNNEL RELEASE Right   . ent surgery    . TONSILLECTOMY AND ADENOIDECTOMY         Home Medications    Prior to Admission medications   Medication Sig Start Date End Date Taking? Authorizing  Provider  cyclobenzaprine (FLEXERIL) 10 MG tablet Take 1 tablet (10 mg total) by mouth 3 (three) times daily as needed for muscle spasms. 01/17/17   Gregor Hams, MD  imipramine (TOFRANIL) 50 MG tablet Take 4 tablets (200 mg total) by mouth at bedtime. 10/06/16   Breeback, Royetta Car, PA-C  Insulin Detemir (LEVEMIR FLEXPEN) 100 UNIT/ML Pen Inject 10 Units into the skin daily in evenings. Increase as directed up to 60 Units max - can start Levemir (this medicine) when Toujeo sample pen is out 11/15/16   Emeterio Reeve, DO  Insulin Glargine (TOUJEO SOLOSTAR) 300 UNIT/ML SOPN Inject 10 Units into the skin daily. Inject 10 Units into the skin daily. Increase as directed up to 60 Units max 11/08/16   Emeterio Reeve, DO  Insulin Pen Needle (PEN NEEDLES) 32G X 4 MM MISC Inject 1 Units into the skin daily. 11/06/16   Emeterio Reeve, DO  losartan (COZAAR) 50 MG tablet Take 1 tablet (50 mg total) by mouth daily. Appointment required for further refills 05/14/17   Emeterio Reeve, DO  metFORMIN (GLUCOPHAGE) 1000 MG tablet Take 1 tablet (1,000 mg total) by mouth 2 (two) times daily with a meal. 06/08/15   Breeback, Jade L, PA-C  traMADol (ULTRAM) 50 MG tablet Take 1 tablet (50 mg total) by mouth every 8 (eight) hours as needed. 01/17/17   Gregor Hams, MD    Family History Family History  Problem Relation Age of Onset  . Stroke Other   . Alcohol abuse Other   .  Depression Other   . Hypertension Other   . Hyperlipidemia Other   . Diabetes Mother     Social History Social History   Tobacco Use  . Smoking status: Former Smoker    Last attempt to quit: 08/13/2004    Years since quitting: 12.9  . Smokeless tobacco: Never Used  Substance Use Topics  . Alcohol use: No  . Drug use: No     Allergies   Green dyes and Zithromax [azithromycin dihydrate]   Review of Systems Review of Systems  Neurological: Positive for headaches.  All other systems reviewed and are negative.    Physical  Exam Triage Vital Signs ED Triage Vitals  Enc Vitals Group     BP 07/31/17 0826 (!) 143/88     Pulse Rate 07/31/17 0826 (!) 104     Resp --      Temp 07/31/17 0826 98 F (36.7 C)     Temp Source 07/31/17 0826 Oral     SpO2 07/31/17 0826 98 %     Weight 07/31/17 0827 218 lb (98.9 kg)     Height 07/31/17 0827 6' (1.829 m)     Head Circumference --      Peak Flow --      Pain Score 07/31/17 0826 4     Pain Loc --      Pain Edu? --      Excl. in [place]? --    No data found.  Updated Vital Signs BP (!) 143/88 (BP Location: Right Arm)   Pulse (!) 104   Temp 98 F (36.7 C) (Oral)   Ht 6' (1.829 m)   Wt 218 lb (98.9 kg)   SpO2 98%   BMI 29.57 kg/m   Visual Acuity Right Eye Distance:   Left Eye Distance:   Bilateral Distance:    Right Eye Near:   Left Eye Near:    Bilateral Near:     Physical Exam  Constitutional: He appears well-developed and well-nourished.  HENT:  Head: Normocephalic.  Right Ear: Hearing, tympanic membrane and ear canal normal.  Left Ear: Hearing, tympanic membrane and ear canal normal.  Mouth/Throat: Mucous membranes are normal. Posterior oropharyngeal edema present. Tonsils are 0 on the right. Tonsils are 0 on the left.  Tender maxillary sinuses  Eyes: Pupils are equal, round, and reactive to light.  Neck: Normal range of motion.  Cardiovascular: Normal rate.  Pulmonary/Chest: Effort normal and breath sounds normal.  Skin: Skin is warm.  Nursing note and vitals reviewed.    UC Treatments / Results  Labs (all labs ordered are listed, but only abnormal results are displayed) Labs Reviewed - No data to display  EKG  EKG Interpretation None       Radiology No results found.  Procedures Procedures (including critical care time)  Medications Ordered in UC Medications - No data to display   Initial Impression / Assessment and Plan / UC Course  I have reviewed the triage vital signs and the nursing notes.  Pertinent labs & imaging  results that were available during my care of the patient were reviewed by me and considered in my medical decision making (see chart for details).       Final Clinical Impressions(s) / UC Diagnoses   Final diagnoses:  Acute non-recurrent ethmoidal sinusitis    ED Discharge Orders    None    An After Visit Summary was printed and given to the patient.   Controlled Substance Prescriptions [place] Controlled Substance  Registry consulted? Not Applicable   Fransico Meadow, [place] 07/31/17 [id number]

## 2017-07-31 NOTE — ED Triage Notes (Signed)
Started last night with facial pain and pressure.  Nose and throat burning last night and this morning.  Severe headache.

## 2017-08-12 ENCOUNTER — Other Ambulatory Visit: Payer: Self-pay | Admitting: Physician Assistant

## 2017-08-13 NOTE — Telephone Encounter (Signed)
Pt needs appointment for diabtes. No further refills given until appt. KG LPN

## 2017-09-07 ENCOUNTER — Other Ambulatory Visit: Payer: Self-pay

## 2017-09-07 MED ORDER — IMIPRAMINE HCL 50 MG PO TABS: 200.0000 mg | ORAL_TABLET | Freq: Every day | ORAL | 0 refills | Status: DC

## 2017-09-11 ENCOUNTER — Other Ambulatory Visit: Payer: Self-pay | Admitting: Physician Assistant

## 2017-10-08 ENCOUNTER — Encounter: Payer: Self-pay | Admitting: Physician Assistant

## 2017-10-08 ENCOUNTER — Ambulatory Visit (INDEPENDENT_AMBULATORY_CARE_PROVIDER_SITE_OTHER): Payer: Commercial Managed Care - PPO | Admitting: Physician Assistant

## 2017-10-08 ENCOUNTER — Telehealth: Payer: Self-pay

## 2017-10-08 VITALS — BP 164/77 | HR 90 | Ht 72.01 in | Wt 213.0 lb

## 2017-10-08 DIAGNOSIS — Z1159 Encounter for screening for other viral diseases: Secondary | ICD-10-CM

## 2017-10-08 DIAGNOSIS — I10 Essential (primary) hypertension: Secondary | ICD-10-CM | POA: Diagnosis not present

## 2017-10-08 DIAGNOSIS — R809 Proteinuria, unspecified: Secondary | ICD-10-CM

## 2017-10-08 DIAGNOSIS — E1129 Type 2 diabetes mellitus with other diabetic kidney complication: Secondary | ICD-10-CM | POA: Diagnosis not present

## 2017-10-08 DIAGNOSIS — Z794 Long term (current) use of insulin: Secondary | ICD-10-CM

## 2017-10-08 DIAGNOSIS — Z23 Encounter for immunization: Secondary | ICD-10-CM | POA: Diagnosis not present

## 2017-10-08 DIAGNOSIS — Z0000 Encounter for general adult medical examination without abnormal findings: Secondary | ICD-10-CM

## 2017-10-08 DIAGNOSIS — Z13220 Encounter for screening for lipoid disorders: Secondary | ICD-10-CM | POA: Diagnosis not present

## 2017-10-08 LAB — POCT GLYCOSYLATED HEMOGLOBIN (HGB A1C): Hemoglobin A1C: 9.7

## 2017-10-08 MED ORDER — LOSARTAN POTASSIUM 50 MG PO TABS: 50.0000 mg | ORAL_TABLET | Freq: Every day | ORAL | 1 refills | Status: DC

## 2017-10-08 MED ORDER — DULAGLUTIDE 1.5 MG/0.5ML ~~LOC~~ SOAJ: 1.0000 "pen " | SUBCUTANEOUS | 2 refills | Status: DC

## 2017-10-08 MED ORDER — DAPAGLIFLOZIN PRO-METFORMIN ER 10-1000 MG PO TB24: 1.0000 | ORAL_TABLET | Freq: Every day | ORAL | 2 refills | Status: DC

## 2017-10-08 MED ORDER — IMIPRAMINE HCL 50 MG PO TABS: 200.0000 mg | ORAL_TABLET | Freq: Every day | ORAL | 5 refills | Status: DC

## 2017-10-08 NOTE — Progress Notes (Signed)
Subjective:    Patient ID: Robert Frey, male    DOB: 1960/10/14, 57 y.o.   MRN: [id number]  HPI  Pt is a 57 yo obese male with HTN, COPD, DM who presents to the clinic for follow up.   HTN- denies any CP, palpitations, headaches or vision changes. He is not checking BP. He is not taking cozaar.   DM- pt would like to "get back on the right track". He has started watching his diet more. He is not checking sugars regularly but they range from 130's to 200's. At times he checkins in am and then a few hours after meal. Denies any hypoglycemia. Only taking metformin. Has long acting insulin but "really not taking". He denies any exercising. No hypoglycemic events.   .. Active Ambulatory Problems    Diagnosis Date Noted  . HYPERCHOLESTEROLEMIA 03/06/2006  . OBESITY, NOS 03/06/2006  . OBSESSIVE COMPUL. DISORDER 03/06/2006  . KNEE PAIN, BILATERAL 12/28/2006  . ANKYLOSING SPONDYLITIS 09/30/2009  . ELEVATED BLOOD PRESSURE WITHOUT DIAGNOSIS OF HYPERTENSION 08/29/2006  . Psoriasis 10/11/2012  . Type 2 diabetes mellitus with microalbuminuria, with long-term current use of insulin ([place]) 10/11/2012  . Heel spur 01/20/2014  . Osteoarthritis of foot, left 01/20/2014  . Lung nodule < 6cm on CT 05/15/2014  . Essential hypertension, benign 06/08/2015  . Microalbuminuria 06/08/2015  . Bruising 06/09/2015  . Lumbosacral strain 01/17/2017   Resolved Ambulatory Problems    Diagnosis Date Noted  . Cough 11/25/2010   Past Medical History:  Diagnosis Date  . Ankylosing spondylitis ([place])   . COPD (chronic obstructive pulmonary disease) ([place])   . Diabetes mellitus without complication ([place])       Review of Systems  All other systems reviewed and are negative.      Objective:   Physical Exam  Constitutional: He is oriented to person, place, and time. He appears well-developed and well-nourished.  HENT:  Head: Normocephalic and atraumatic.  Cardiovascular: Normal rate and regular rhythm.   Pulmonary/Chest: Effort normal and breath sounds normal.  Neurological: He is alert and oriented to person, place, and time.  Psychiatric: He has a normal mood and affect. His behavior is normal.          Assessment & Plan:  Marland KitchenMarland KitchenAleczander was seen today for medication refill.  Diagnoses and all orders for this visit:  Type 2 diabetes mellitus with microalbuminuria, with long-term current use of insulin ([name]) -     POCT HgB A1C -     Dulaglutide (TRULICITY) 1.5 CW/2.3JS SOPN; Inject 1 pen into the skin once a week. -     COMPLETE METABOLIC PANEL WITH GFR -     Dapagliflozin-metFORMIN HCl ER (XIGDUO XR) 02-999 MG TB24; Take 1 tablet by mouth daily.  Encounter for hepatitis C screening test for low risk patient -     Hepatitis C Antibody  Screening for lipid disorders -     Lipid Panel w/reflex Direct LDL  Preventative health care -     Hepatitis C Antibody -     Lipid Panel w/reflex Direct LDL -     COMPLETE METABOLIC PANEL WITH GFR -     CBC with Differential/Platelet -     PSA  Essential hypertension, benign -     losartan (COZAAR) 50 MG tablet; Take 1 tablet (50 mg total) by mouth daily.  Need for 23-polyvalent pneumococcal polysaccharide vaccine -     Pneumococcal polysaccharide vaccine 23-valent greater than or equal to 2yo subcutaneous/IM     .Marland Kitchen  Lab Results  Component Value Date   HGBA1C 9.7 10/08/2017   .Marland Kitchen Diabetic Foot Exam - Simple   Simple Foot Form Visual Inspection No deformities, no ulcerations, no other skin breakdown bilaterally:  Yes Sensation Testing Intact to touch and monofilament testing bilaterally:  Yes Pulse Check Posterior Tibialis and Dorsalis pulse intact bilaterally:  Yes Comments    A!C improved but still not to goal.  Added trulicity and xigduo.  Lipid level to be checked. On statin.  BP controlled. On ARB. Encouraged yearly eye exam.  Pneumonia 23 given today.  Follow up in 3 months.  Discussed diabetic diet and exercise.

## 2017-10-08 NOTE — Patient Instructions (Addendum)
Stop metformin. Start xigduo and trulicity.  cologuard to be sent to house.  Get labs.  Follow up in 3 months.

## 2017-10-08 NOTE — Telephone Encounter (Signed)
Sent to pharmacy 

## 2017-12-03 NOTE — Telephone Encounter (Signed)
PA has been approved for Trulicity and form sent to scan.

## 2017-12-07 ENCOUNTER — Telehealth: Payer: Self-pay

## 2017-12-07 NOTE — Telephone Encounter (Signed)
Called pt to encourage him to complete cologuard kit and send it back in. Pt agreed. -[facility] 

## 2018-03-23 ENCOUNTER — Other Ambulatory Visit: Payer: Self-pay | Admitting: Physician Assistant

## 2018-03-23 DIAGNOSIS — R809 Proteinuria, unspecified: Principal | ICD-10-CM

## 2018-03-23 DIAGNOSIS — E1129 Type 2 diabetes mellitus with other diabetic kidney complication: Secondary | ICD-10-CM

## 2018-03-23 DIAGNOSIS — Z794 Long term (current) use of insulin: Principal | ICD-10-CM

## 2018-04-05 ENCOUNTER — Other Ambulatory Visit: Payer: Self-pay | Admitting: Physician Assistant

## 2018-04-08 ENCOUNTER — Telehealth: Payer: Self-pay | Admitting: Physician Assistant

## 2018-04-08 NOTE — Telephone Encounter (Signed)
Please call patient the ACC/AHC guidelines state for patients with diabetes need to be on a statin to reduce CV risk. I don't have up to date lipid panel on you but willing to send over a statin if willing to start. You are due for follow up. You could also come in and discuss recommendation.

## 2018-04-22 ENCOUNTER — Other Ambulatory Visit: Payer: Self-pay | Admitting: Physician Assistant

## 2018-04-22 DIAGNOSIS — Z794 Long term (current) use of insulin: Principal | ICD-10-CM

## 2018-04-22 DIAGNOSIS — R809 Proteinuria, unspecified: Principal | ICD-10-CM

## 2018-04-22 DIAGNOSIS — E1129 Type 2 diabetes mellitus with other diabetic kidney complication: Secondary | ICD-10-CM

## 2018-05-07 ENCOUNTER — Telehealth: Payer: Self-pay | Admitting: Physician Assistant

## 2018-05-07 DIAGNOSIS — R809 Proteinuria, unspecified: Principal | ICD-10-CM

## 2018-05-07 DIAGNOSIS — E1129 Type 2 diabetes mellitus with other diabetic kidney complication: Secondary | ICD-10-CM

## 2018-05-07 DIAGNOSIS — Z794 Long term (current) use of insulin: Principal | ICD-10-CM

## 2018-05-07 MED ORDER — DULAGLUTIDE 1.5 MG/0.5ML ~~LOC~~ SOAJ: SUBCUTANEOUS | 0 refills | Status: DC

## 2018-05-07 MED ORDER — IMIPRAMINE HCL 50 MG PO TABS: 200.0000 mg | ORAL_TABLET | Freq: Every day | ORAL | 0 refills | Status: DC

## 2018-05-07 NOTE — Telephone Encounter (Signed)
Pt running out of Rx's, due for follow up. Scheduled for next week when PCP back in office. Refill sent, Pt aware he must keep follow up.

## 2018-05-10 NOTE — Telephone Encounter (Signed)
Pt has appt next week to addres statin

## 2018-05-14 ENCOUNTER — Encounter: Payer: Self-pay | Admitting: Physician Assistant

## 2018-05-14 ENCOUNTER — Ambulatory Visit (INDEPENDENT_AMBULATORY_CARE_PROVIDER_SITE_OTHER): Payer: Commercial Managed Care - PPO | Admitting: Physician Assistant

## 2018-05-14 VITALS — BP 161/87 | HR 99 | Ht 72.01 in | Wt 211.0 lb

## 2018-05-14 DIAGNOSIS — R911 Solitary pulmonary nodule: Secondary | ICD-10-CM

## 2018-05-14 DIAGNOSIS — R809 Proteinuria, unspecified: Secondary | ICD-10-CM | POA: Diagnosis not present

## 2018-05-14 DIAGNOSIS — I10 Essential (primary) hypertension: Secondary | ICD-10-CM

## 2018-05-14 DIAGNOSIS — Z794 Long term (current) use of insulin: Secondary | ICD-10-CM

## 2018-05-14 DIAGNOSIS — L409 Psoriasis, unspecified: Secondary | ICD-10-CM

## 2018-05-14 DIAGNOSIS — E1129 Type 2 diabetes mellitus with other diabetic kidney complication: Secondary | ICD-10-CM

## 2018-05-14 DIAGNOSIS — Z1159 Encounter for screening for other viral diseases: Secondary | ICD-10-CM

## 2018-05-14 DIAGNOSIS — IMO0001 Reserved for inherently not codable concepts without codable children: Secondary | ICD-10-CM

## 2018-05-14 DIAGNOSIS — E7800 Pure hypercholesterolemia, unspecified: Secondary | ICD-10-CM

## 2018-05-14 DIAGNOSIS — Z125 Encounter for screening for malignant neoplasm of prostate: Secondary | ICD-10-CM

## 2018-05-14 LAB — POCT GLYCOSYLATED HEMOGLOBIN (HGB A1C): HEMOGLOBIN A1C: 11.9 % — AB (ref 4.0–5.6)

## 2018-05-14 MED ORDER — DULAGLUTIDE 1.5 MG/0.5ML ~~LOC~~ SOAJ: SUBCUTANEOUS | 2 refills | Status: DC

## 2018-05-14 MED ORDER — LOSARTAN POTASSIUM 50 MG PO TABS: 50.0000 mg | ORAL_TABLET | Freq: Every day | ORAL | 1 refills | Status: DC

## 2018-05-14 MED ORDER — DAPAGLIFLOZIN PRO-METFORMIN ER 10-1000 MG PO TB24: 1.0000 | ORAL_TABLET | Freq: Every day | ORAL | 2 refills | Status: DC

## 2018-05-14 MED ORDER — INSULIN GLARGINE (2 UNIT DIAL) 300 UNIT/ML ~~LOC~~ SOPN: 10.0000 [IU] | PEN_INJECTOR | Freq: Every day | SUBCUTANEOUS | 2 refills | Status: DC

## 2018-05-14 MED ORDER — CLOBETASOL PROPIONATE 0.05 % EX CREA: 1.0000 "application " | TOPICAL_CREAM | Freq: Two times a day (BID) | CUTANEOUS | 2 refills | Status: DC

## 2018-05-14 MED ORDER — IMIPRAMINE HCL 50 MG PO TABS: 200.0000 mg | ORAL_TABLET | Freq: Every day | ORAL | 1 refills | Status: DC

## 2018-05-14 NOTE — Progress Notes (Signed)
Subjective:    Patient ID: Robert Frey, male    DOB: 1960-11-02, 57 y.o.   MRN: [id number]  HPI  Pt is a 57 yo male with T2DM, HTN, psorasis, HLD who presents to the clinic for follow up.   Pt admits he has not been compliant with medications. He has not been watching his diet and/or exercising. He is not checking his sugars regularly but before he got here was 158. He has only been doing trulicity. He request the new testing device libre to see if that would make checking BS easier.   He denies and CP, palpitations, headache or vision changes.   He has not been on any biologics for psorasis in a few years. He has done well but has a itchy outbreak on his left lower leg.   .. Active Ambulatory Problems    Diagnosis Date Noted  . HYPERCHOLESTEROLEMIA 03/06/2006  . OBESITY, NOS 03/06/2006  . OBSESSIVE COMPUL. DISORDER 03/06/2006  . KNEE PAIN, BILATERAL 12/28/2006  . ANKYLOSING SPONDYLITIS 09/30/2009  . ELEVATED BLOOD PRESSURE WITHOUT DIAGNOSIS OF HYPERTENSION 08/29/2006  . Psoriasis 10/11/2012  . Type 2 diabetes mellitus with microalbuminuria, with long-term current use of insulin ([place]) 10/11/2012  . Heel spur 01/20/2014  . Osteoarthritis of foot, left 01/20/2014  . Lung nodule < 6cm on CT 05/15/2014  . Essential hypertension, benign 06/08/2015  . Microalbuminuria 06/08/2015  . Bruising 06/09/2015  . Lumbosacral strain 01/17/2017   Resolved Ambulatory Problems    Diagnosis Date Noted  . Cough 11/25/2010   Past Medical History:  Diagnosis Date  . COPD (chronic obstructive pulmonary disease) ([place])   . Diabetes mellitus without complication ([place])      Review of Systems  All other systems reviewed and are negative.      Objective:   Physical Exam Constitutional:      Appearance: Normal appearance.  HENT:     Head: Normocephalic and atraumatic.  Cardiovascular:     Rate and Rhythm: Normal rate and regular rhythm.  Pulmonary:     Effort: Pulmonary effort is  normal.     Breath sounds: Normal breath sounds.  Skin:    Comments: Large area of raised plaque with defined border on left lower leg and foot.   Neurological:     General: No focal deficit present.     Mental Status: He is alert and oriented to person, place, and time.  Psychiatric:        Mood and Affect: Mood normal.        Behavior: Behavior normal.           Assessment & Plan:  Marland KitchenMarland KitchenKyjuan was seen today for follow-up.  Diagnoses and all orders for this visit:  Type 2 diabetes mellitus with microalbuminuria, with long-term current use of insulin ([name]) -     POCT glycosylated hemoglobin (Hb A1C) -     Dulaglutide (TRULICITY) 1.5 PN/3.6RW SOPN; INJECT 1 PEN INTO THE SKIN ONCE A WEEK. -     Dapagliflozin-metFORMIN HCl ER (XIGDUO XR) 02-999 MG TB24; Take 1 tablet by mouth daily. -     Insulin Glargine, 2 Unit Dial, (TOUJEO MAX SOLOSTAR) 300 UNIT/ML SOPN; Inject 10 Units into the skin at bedtime. Increase by 2 units every 5 days until fasting blood sugars between 90-130. -     Continuous Blood Gluc Receiver (FREESTYLE LIBRE 14 DAY READER) DEVI; 1 applicator by Does not apply route every 14 (fourteen) days. -     Continuous Blood Gluc  Sensor (FREESTYLE LIBRE 14 DAY SENSOR) MISC; 1 Device by Does not apply route every 14 (fourteen) days.  Essential hypertension, benign -     COMPLETE METABOLIC PANEL WITH GFR -     CBC with Differential/Platelet -     losartan (COZAAR) 50 MG tablet; Take 1 tablet (50 mg total) by mouth daily.  Psoriasis -     clobetasol cream (TEMOVATE) 0.05 %; Apply 1 application topically 2 (two) times daily.  HYPERCHOLESTEROLEMIA -     Lipid Panel w/reflex Direct LDL  Lung nodule < 6cm on CT  Need for hepatitis C screening test -     Hepatitis C Antibody  Prostate cancer screening -     PSA  Other orders -     imipramine (TOFRANIL) 50 MG tablet; Take 4 tablets (200 mg total) by mouth at bedtime.   .. Results for orders placed or performed in visit  on 05/14/18  POCT glycosylated hemoglobin (Hb A1C)  Result Value Ref Range   Hemoglobin A1C 11.9 (A) 4.0 - 5.6 %   HbA1c POC (<> result, manual entry)     HbA1c, POC (prediabetic range)     HbA1c, POC (controlled diabetic range)     A!C is terrible.  Long discussion about controlling sugars and being compliant with medications.  Added toujeo. Refilled xigduo.  BP not controlled. Start back on cozaar.  Added statin. Even though not had labs done in a while.  Libre order for better testing. Goal fasting glucose 90 to 130.  Flu and pneumonia up to date.    CT showed lung nodule. Pt declined follow up CT.   Hx of psorasis. Will try clobetosol. Rash almost appears like it could be fungus as well. If worsening or not improving consider anti fungal.   Follow up in 3 months.

## 2018-05-14 NOTE — Patient Instructions (Addendum)
Start toujeo 10 units at bedtime. Increase by 2 units every 5 days until fasting blood sugar between 90-130.  Start back xigduo daily.   Follow up in 3 months.

## 2018-05-16 MED ORDER — FREESTYLE LIBRE 14 DAY SENSOR MISC: 1.0000 | 11 refills | Status: DC

## 2018-05-16 MED ORDER — FREESTYLE LIBRE 14 DAY READER DEVI: 1.0000 | 11 refills | Status: DC

## 2018-05-17 MED ORDER — ATORVASTATIN CALCIUM 20 MG PO TABS: 20.0000 mg | ORAL_TABLET | Freq: Every day | ORAL | 3 refills | Status: DC

## 2018-08-04 ENCOUNTER — Other Ambulatory Visit: Payer: Self-pay | Admitting: Physician Assistant

## 2018-08-11 ENCOUNTER — Other Ambulatory Visit: Payer: Self-pay | Admitting: Physician Assistant

## 2018-08-11 DIAGNOSIS — I10 Essential (primary) hypertension: Secondary | ICD-10-CM

## 2018-08-31 ENCOUNTER — Other Ambulatory Visit: Payer: Self-pay | Admitting: Physician Assistant

## 2018-09-01 ENCOUNTER — Other Ambulatory Visit: Payer: Self-pay | Admitting: Physician Assistant

## 2018-09-01 DIAGNOSIS — E1129 Type 2 diabetes mellitus with other diabetic kidney complication: Secondary | ICD-10-CM

## 2018-09-01 DIAGNOSIS — R809 Proteinuria, unspecified: Principal | ICD-10-CM

## 2018-09-01 DIAGNOSIS — Z794 Long term (current) use of insulin: Principal | ICD-10-CM

## 2018-09-12 ENCOUNTER — Encounter: Payer: Self-pay | Admitting: Physician Assistant

## 2018-09-12 ENCOUNTER — Ambulatory Visit (INDEPENDENT_AMBULATORY_CARE_PROVIDER_SITE_OTHER): Payer: Commercial Managed Care - PPO | Admitting: Physician Assistant

## 2018-09-12 VITALS — Temp 97.3°F

## 2018-09-12 DIAGNOSIS — J301 Allergic rhinitis due to pollen: Secondary | ICD-10-CM

## 2018-09-12 DIAGNOSIS — R51 Headache: Secondary | ICD-10-CM

## 2018-09-12 DIAGNOSIS — Z0289 Encounter for other administrative examinations: Secondary | ICD-10-CM

## 2018-09-12 DIAGNOSIS — R509 Fever, unspecified: Secondary | ICD-10-CM | POA: Diagnosis not present

## 2018-09-12 DIAGNOSIS — R0981 Nasal congestion: Secondary | ICD-10-CM | POA: Diagnosis not present

## 2018-09-12 NOTE — Progress Notes (Signed)
Virtual Visit via Telephone Note  I connected with Robert Frey on 09/12/18 at  2:20 PM EDT by telephone and verified that I am speaking with the correct person using two identifiers.   I discussed the limitations, risks, security and privacy concerns of performing an evaluation and management service by telephone and the availability of in person appointments. I also discussed with the patient that there may be a patient responsible charge related to this service. The patient expressed understanding and agreed to proceed.   History of Present Illness: HPI:                                                                Robert Frey is a 58 y.o. male   CC: fever  Onset: April 3rd 99.8 F, was sent home from work Highest temp 100.1 F, 9 days ago Has not had a fever since Monday 4/12 99.8 F Temperature 97.3 today without any fever reducers for the last 4-5 days Reports last week he had a runny nose and mild nasal congestion and mild headache that he attributed to pollen. Symptoms resolved. He has a history of environmental allergies and takes Allegra as needed He is asymptomatic today. Denies malaise, chills, cough, wheezing, dyspnea.  States his employer will not let him return to work until he has a note.  Past Medical History:  Diagnosis Date  . Ankylosing spondylitis ([place])   . COPD (chronic obstructive pulmonary disease) ([place])   . Diabetes mellitus without complication [facility])    Past Surgical History:  Procedure Laterality Date  . APPENDECTOMY    . CARPAL TUNNEL RELEASE Right   . ent surgery    . TONSILLECTOMY AND ADENOIDECTOMY     Social History   Tobacco Use  . Smoking status: Former Smoker    Last attempt to quit: 08/13/2004    Years since quitting: 14.0  . Smokeless tobacco: Never Used  Substance Use Topics  . Alcohol use: No   family history includes Alcohol abuse in an other family member; Depression in an other family member; Diabetes in his mother; Hyperlipidemia  in an other family member; Hypertension in an other family member; Stroke in an other family member.    ROS: negative except as noted in the HPI  Medications: Current Outpatient Medications  Medication Sig Dispense Refill  . atorvastatin (LIPITOR) 20 MG tablet Take 1 tablet (20 mg total) by mouth daily. 90 tablet 3  . clobetasol cream (TEMOVATE) 4.70 % Apply 1 application topically 2 (two) times daily. 80 g 2  . Continuous Blood Gluc Receiver (FREESTYLE LIBRE 14 DAY READER) DEVI 1 applicator by Does not apply route every 14 (fourteen) days. 2 Device 11  . Continuous Blood Gluc Sensor (FREESTYLE LIBRE 14 DAY SENSOR) MISC 1 Device by Does not apply route every 14 (fourteen) days. 2 each 11  . Dapagliflozin-metFORMIN HCl ER (XIGDUO XR) 02-999 MG TB24 Take 1 tablet by mouth daily. 30 tablet 2  . Dulaglutide (TRULICITY) 1.5 JG/2.8ZM SOPN INJECT 1 PEN INTO THE SKIN ONCE A WEEK. 4 pen 2  . imipramine (TOFRANIL) 50 MG tablet Take 4 tablets (200 mg total) by mouth at bedtime. NEEDS LABS AND APPT- CONTACT PCP OFFICE 60 tablet 0  . Insulin Glargine (TOUJEO SOLOSTAR) 300  UNIT/ML SOPN Inject 10 Units into the skin daily. Inject 10 Units into the skin daily. Increase as directed up to 60 Units max 2 pen 0  . Insulin Pen Needle (PEN NEEDLES) 32G X 4 MM MISC Inject 1 Units into the skin daily. 100 each 99  . losartan (COZAAR) 25 MG tablet Take 2 tablets (50 mg total) by mouth daily. Due for follow up visit w/PCP 60 tablet 0  . TOUJEO MAX SOLOSTAR 300 UNIT/ML SOPN INJECT 10 UTS INTO SKIN AT BEDTIME. INCREASE BY 2 UNITS EVERY 5 DAYS TIL FASTING SUGARS AT 90-130. 2 pen 0   No current facility-administered medications for this visit.    Allergies  Allergen Reactions  . Green Dyes Hives  . Zithromax [Azithromycin Dihydrate] Rash       Objective:  Temp (!) 97.3 F (36.3 C) (Oral)  Pulm: Normal work of breathing, normal phonation, speaking in full sentences Neuro: alert and oriented x 3   No results  found for this or any previous visit (from the past 72 hour(s)). No results found.    Assessment and Plan: 58 y.o. male with   .Robert Frey was seen today for low grade temp.  Diagnoses and all orders for this visit:  Low grade fever  Seasonal allergic rhinitis due to pollen   Low grade fever max temp 100.1 beginning 2 weeks ago associated with rhinitis/nasal congestion/headache Symptoms resolved with antihistamine He has been fever-free for over 72 hours without the use of fever reducers and may return to work without restrictions Sent MyChart activation link to patient's cell phone and instructed him to message when he is logged in so I can send his work note  Follow Up Instructions:    I discussed the assessment and treatment plan with the patient. The patient was provided an opportunity to ask questions and all were answered. The patient agreed with the plan and demonstrated an understanding of the instructions.   The patient was advised to call back or seek an in-person evaluation if the symptoms worsen or if the condition fails to improve as anticipated.  I provided 11-20 minutes of non-face-to-face time during this encounter.   Trixie Dredge, [place]

## 2018-09-15 ENCOUNTER — Other Ambulatory Visit: Payer: Self-pay | Admitting: Physician Assistant

## 2018-09-16 NOTE — Telephone Encounter (Signed)
Patient last seen by PCP 05/14/18, but was recently seen by Nelson Chimes on 09/12/18  Please advise on refill.Marland Kitchen

## 2018-09-17 ENCOUNTER — Other Ambulatory Visit: Payer: Self-pay | Admitting: Physician Assistant

## 2018-09-17 DIAGNOSIS — I10 Essential (primary) hypertension: Secondary | ICD-10-CM

## 2018-10-04 ENCOUNTER — Other Ambulatory Visit: Payer: Self-pay | Admitting: Physician Assistant

## 2018-10-04 DIAGNOSIS — E1129 Type 2 diabetes mellitus with other diabetic kidney complication: Secondary | ICD-10-CM

## 2018-10-04 NOTE — Telephone Encounter (Signed)
Please call patient and schedule virtual/office visit with Abdiaziz Klahn for follow up prior to refills. Thanks!  

## 2018-10-07 NOTE — Telephone Encounter (Signed)
Left A VM for pt to schedule a VIRTUAL or IN office appt with Luvenia Starch

## 2018-10-08 ENCOUNTER — Encounter: Payer: Self-pay | Admitting: Physician Assistant

## 2018-10-08 LAB — COLOGUARD

## 2018-10-11 ENCOUNTER — Ambulatory Visit (INDEPENDENT_AMBULATORY_CARE_PROVIDER_SITE_OTHER): Payer: Commercial Managed Care - PPO | Admitting: Physician Assistant

## 2018-10-11 VITALS — BP 141/78 | Ht 72.0 in | Wt 211.0 lb

## 2018-10-11 DIAGNOSIS — E782 Mixed hyperlipidemia: Secondary | ICD-10-CM

## 2018-10-11 DIAGNOSIS — I10 Essential (primary) hypertension: Secondary | ICD-10-CM

## 2018-10-11 DIAGNOSIS — Z1211 Encounter for screening for malignant neoplasm of colon: Secondary | ICD-10-CM

## 2018-10-11 DIAGNOSIS — M450 Ankylosing spondylitis of multiple sites in spine: Secondary | ICD-10-CM

## 2018-10-11 DIAGNOSIS — Z794 Long term (current) use of insulin: Secondary | ICD-10-CM

## 2018-10-11 DIAGNOSIS — R809 Proteinuria, unspecified: Secondary | ICD-10-CM

## 2018-10-11 DIAGNOSIS — E1129 Type 2 diabetes mellitus with other diabetic kidney complication: Secondary | ICD-10-CM

## 2018-10-11 DIAGNOSIS — Z125 Encounter for screening for malignant neoplasm of prostate: Secondary | ICD-10-CM

## 2018-10-11 MED ORDER — DULAGLUTIDE 1.5 MG/0.5ML ~~LOC~~ SOAJ: SUBCUTANEOUS | 2 refills | Status: DC

## 2018-10-11 MED ORDER — IMIPRAMINE HCL 50 MG PO TABS: 200.0000 mg | ORAL_TABLET | Freq: Every day | ORAL | 3 refills | Status: DC

## 2018-10-11 MED ORDER — LOSARTAN POTASSIUM 50 MG PO TABS: 50.0000 mg | ORAL_TABLET | Freq: Every day | ORAL | 3 refills | Status: DC

## 2018-10-11 MED ORDER — INSULIN GLARGINE (2 UNIT DIAL) 300 UNIT/ML ~~LOC~~ SOPN: 8.0000 [IU] | PEN_INJECTOR | Freq: Every day | SUBCUTANEOUS | 1 refills | Status: DC

## 2018-10-11 NOTE — Progress Notes (Deleted)
Patient doing well. Blood sugars running 125 in the morning, 150 after food. Has not had recent eye exam or labs. Due for A1C. Asked about Cologuard (received note that they cancelled his order due to not receiving feedback from patient) he states he would like to just get traditional colonoscopy.

## 2018-10-11 NOTE — Progress Notes (Signed)
Patient ID: Robert Frey, male   DOB: Oct 18, 1960, 58 y.o.   MRN: [id number] .Marland KitchenVirtual Visit via Telephone Note  I connected with BREON REHM on 10/14/18 at  9:30 AM EDT by telephone and verified that I am speaking with the correct person using two identifiers.  Location: Patient: home Provider: home   I discussed the limitations, risks, security and privacy concerns of performing an evaluation and management service by telephone and the availability of in person appointments. I also discussed with the patient that there may be a patient responsible charge related to this service. The patient expressed understanding and agreed to proceed.   History of Present Illness: Pt is a 58 yo male with T2DM, HTN, anklosing spondylitis, HLD, OCD who calls into the clinic for medication management.   Pt is doing well. Blood sugars are running 125 in the am and usually 150 after food. Not had recent eye exam or labs. No hypoglycemia. He is taking xigduo and trulicity and toujeo. Pt is taking toujeo twice a day. No open sores or wounds. He is trying to be better with diet. Denies any exercise.   Needs refill on tofranil.    .. Active Ambulatory Problems    Diagnosis Date Noted  . HYPERCHOLESTEROLEMIA 03/06/2006  . OBESITY, NOS 03/06/2006  . OBSESSIVE COMPUL. DISORDER 03/06/2006  . KNEE PAIN, BILATERAL 12/28/2006  . ANKYLOSING SPONDYLITIS 09/30/2009  . Psoriasis 10/11/2012  . Type 2 diabetes mellitus with microalbuminuria, with long-term current use of insulin ([place]) 10/11/2012  . Heel spur 01/20/2014  . Osteoarthritis of foot, left 01/20/2014  . Lung nodule < 6cm on CT 05/15/2014  . Essential hypertension, benign 06/08/2015  . Microalbuminuria 06/08/2015  . Bruising 06/09/2015  . Lumbosacral strain 01/17/2017   Resolved Ambulatory Problems    Diagnosis Date Noted  . ELEVATED BLOOD PRESSURE WITHOUT DIAGNOSIS OF HYPERTENSION 08/29/2006  . Cough 11/25/2010   Past Medical History:  Diagnosis  Date  . COPD (chronic obstructive pulmonary disease) ([place])   . Diabetes mellitus without complication ([place])    Reviewed med, allergy, problem list.    Observations/Objective: No acute distress. Normal mood.  Normal breathing.   .. Today's Vitals   10/11/18 0847  BP: (!) 141/78  Weight: 211 lb (95.7 kg)  Height: 6' (1.829 m)   Body mass index is 28.62 kg/m.  Assessment and Plan: Marland KitchenMarland KitchenSamvel was seen today for diabetes.  Diagnoses and all orders for this visit:  Type 2 diabetes mellitus with microalbuminuria, with long-term current use of insulin ([name]) -     Dulaglutide (TRULICITY) 1.5 WU/9.8JX SOPN; INJECT 1 PEN INTO THE SKIN ONCE A WEEK. -     Insulin Glargine, 2 Unit Dial, (TOUJEO MAX SOLOSTAR) 300 UNIT/ML SOPN; Inject 8 Units as directed at bedtime. Increase by 2 units every 5 days until fasting sugars at 90-130. -     losartan (COZAAR) 50 MG tablet; Take 1 tablet (50 mg total) by mouth daily. -     COMPLETE METABOLIC PANEL WITH GFR -     CBC with Differential/Platelet -     Hemoglobin A1c -     Dapagliflozin-metFORMIN HCl ER (XIGDUO XR) 02-999 MG TB24; Take 1 tablet by mouth daily.  Mixed hyperlipidemia -     Lipid Panel w/reflex Direct LDL  Colon cancer screening -     Ambulatory referral to Gastroenterology  Prostate cancer screening -     PSA  Essential hypertension, benign -     losartan (COZAAR) 50 MG  tablet; Take 1 tablet (50 mg total) by mouth daily.  Ankylosing spondylitis of multiple sites in spine ([name]) -     imipramine (TOFRANIL) 50 MG tablet; Take 4 tablets (200 mg total) by mouth at bedtime.   Never had labs drawn in December. Needs labs.  Refilled medications.  Continue to check morning sugars and goal is between 90-130.  Discussed toujeo is once a day at bedtime does not need to take twice a day.  trulicity is once a week.  xigduo is daily.  On statin.  On ARB. Follow up in 3 months.   Referral for colonoscopy made. Never returned cologuard.     Follow Up Instructions:    I discussed the assessment and treatment plan with the patient. The patient was provided an opportunity to ask questions and all were answered. The patient agreed with the plan and demonstrated an understanding of the instructions.   The patient was advised to call back or seek an in-person evaluation if the symptoms worsen or if the condition fails to improve as anticipated.  I provided 25 minutes of non-face-to-face time during this encounter.   Iran Planas, PA-C

## 2018-10-14 ENCOUNTER — Encounter: Payer: Self-pay | Admitting: Physician Assistant

## 2018-10-14 MED ORDER — DAPAGLIFLOZIN PRO-METFORMIN ER 10-1000 MG PO TB24: 1.0000 | ORAL_TABLET | Freq: Every day | ORAL | 2 refills | Status: DC

## 2018-12-16 ENCOUNTER — Telehealth: Payer: Self-pay | Admitting: Physician Assistant

## 2018-12-16 NOTE — Telephone Encounter (Signed)
Received fax from [employer] that Toujeo requires a PA. Information has been sent to the insurance company. Awaiting determination.

## 2018-12-17 NOTE — Telephone Encounter (Signed)
Received fax from [employer] that Trulicity was approved from 12/16/2018 through 12/16/2019.  Pharmacy notified and forms sent to scan.

## 2019-01-01 ENCOUNTER — Encounter: Payer: Self-pay | Admitting: Physician Assistant

## 2019-01-01 ENCOUNTER — Telehealth: Payer: Self-pay | Admitting: Physician Assistant

## 2019-01-01 ENCOUNTER — Ambulatory Visit (INDEPENDENT_AMBULATORY_CARE_PROVIDER_SITE_OTHER): Payer: Commercial Managed Care - PPO | Admitting: Physician Assistant

## 2019-01-01 ENCOUNTER — Other Ambulatory Visit: Payer: Self-pay

## 2019-01-01 VITALS — BP 165/72 | HR 112 | Ht 71.0 in | Wt 211.0 lb

## 2019-01-01 DIAGNOSIS — R809 Proteinuria, unspecified: Secondary | ICD-10-CM | POA: Diagnosis not present

## 2019-01-01 DIAGNOSIS — I10 Essential (primary) hypertension: Secondary | ICD-10-CM

## 2019-01-01 DIAGNOSIS — E1129 Type 2 diabetes mellitus with other diabetic kidney complication: Secondary | ICD-10-CM | POA: Diagnosis not present

## 2019-01-01 DIAGNOSIS — L409 Psoriasis, unspecified: Secondary | ICD-10-CM

## 2019-01-01 DIAGNOSIS — Z794 Long term (current) use of insulin: Secondary | ICD-10-CM

## 2019-01-01 DIAGNOSIS — R21 Rash and other nonspecific skin eruption: Secondary | ICD-10-CM

## 2019-01-01 DIAGNOSIS — Z1159 Encounter for screening for other viral diseases: Secondary | ICD-10-CM

## 2019-01-01 DIAGNOSIS — R82998 Other abnormal findings in urine: Secondary | ICD-10-CM | POA: Insufficient documentation

## 2019-01-01 LAB — POCT URINALYSIS DIPSTICK
Bilirubin, UA: NEGATIVE
Glucose, UA: POSITIVE — AB
Leukocytes, UA: NEGATIVE
Nitrite, UA: NEGATIVE
Protein, UA: POSITIVE — AB
Spec Grav, UA: 1.02 (ref 1.010–1.025)
Urobilinogen, UA: 0.2 E.U./dL
pH, UA: 5 (ref 5.0–8.0)

## 2019-01-01 LAB — POCT UA - MICROALBUMIN
Creatinine, POC: 200 mg/dL
Microalbumin Ur, POC: 150 mg/L

## 2019-01-01 LAB — POCT GLYCOSYLATED HEMOGLOBIN (HGB A1C): Hemoglobin A1C: 10.6 % — AB (ref 4.0–5.6)

## 2019-01-01 MED ORDER — CLOTRIMAZOLE-BETAMETHASONE 1-0.05 % EX CREA: 1.0000 "application " | TOPICAL_CREAM | Freq: Two times a day (BID) | CUTANEOUS | 0 refills | Status: DC

## 2019-01-01 MED ORDER — OZEMPIC (1 MG/DOSE) 2 MG/1.5ML ~~LOC~~ SOPN: 1.0000 mg | PEN_INJECTOR | SUBCUTANEOUS | 2 refills | Status: DC

## 2019-01-01 MED ORDER — LOSARTAN POTASSIUM 100 MG PO TABS: 100.0000 mg | ORAL_TABLET | Freq: Every day | ORAL | 2 refills | Status: DC

## 2019-01-01 NOTE — Progress Notes (Signed)
Subjective:    Patient ID: Robert Frey, male    DOB: August 26, 1960, 58 y.o.   MRN: [id number]  HPI  Pt is a 58 yo male with T2DM, HTN, psoriasis who presents to the clinic for Diabetes follow up.   Pt is checking his morning and evening sugars. They range from 150's to 220's in the am. He is taking xigduo, trulcity, and toujeo. He is trying to eat less sugars/carbs and more protein. Last a1c was 11.6. he is not exercising. He is worried about his liver. He has had some dark urine recently off and on.   Pt denies any CP, palpitations, headaches or vision changes.   He has a itchy rash on abdomen. He feels like it looks different than his usual psoriasis rash.   .. Active Ambulatory Problems    Diagnosis Date Noted  . HYPERCHOLESTEROLEMIA 03/06/2006  . OBESITY, NOS 03/06/2006  . OBSESSIVE COMPUL. DISORDER 03/06/2006  . KNEE PAIN, BILATERAL 12/28/2006  . ANKYLOSING SPONDYLITIS 09/30/2009  . Psoriasis 10/11/2012  . Type 2 diabetes mellitus with microalbuminuria, with long-term current use of insulin ([place]) 10/11/2012  . Heel spur 01/20/2014  . Osteoarthritis of foot, left 01/20/2014  . Lung nodule < 6cm on CT 05/15/2014  . Essential hypertension 06/08/2015  . Microalbuminuria 06/08/2015  . Bruising 06/09/2015  . Lumbosacral strain 01/17/2017  . Dark urine 01/01/2019   Resolved Ambulatory Problems    Diagnosis Date Noted  . ELEVATED BLOOD PRESSURE WITHOUT DIAGNOSIS OF HYPERTENSION 08/29/2006  . Cough 11/25/2010   Past Medical History:  Diagnosis Date  . COPD (chronic obstructive pulmonary disease) ([place])   . Diabetes mellitus without complication ([place])      Review of Systems   see HPI.  Objective:   Physical Exam Vitals signs reviewed.  Constitutional:      Appearance: Normal appearance. He is obese.  HENT:     Head: Normocephalic.     Nose: Nose normal.     Mouth/Throat:     Mouth: Mucous membranes are moist.  Eyes:     Extraocular Movements: Extraocular  movements intact.     Pupils: Pupils are equal, round, and reactive to light.  Cardiovascular:     Rate and Rhythm: Normal rate and regular rhythm.     Pulses: Normal pulses.  Pulmonary:     Effort: Pulmonary effort is normal.     Breath sounds: Normal breath sounds.  Abdominal:     Comments: Hepatomegaly.   Skin:    Comments: Scaly rash on abdomen with some central clearing.   Neurological:     General: No focal deficit present.     Mental Status: He is alert and oriented to person, place, and time.  Psychiatric:        Mood and Affect: Mood normal.        Behavior: Behavior normal.           Assessment & Plan:  Marland KitchenMarland KitchenJanelle was seen today for rash.  Diagnoses and all orders for this visit:  Type 2 diabetes mellitus with microalbuminuria, with long-term current use of insulin ([name]) -     POCT glycosylated hemoglobin (Hb A1C) -     POCT UA - Microalbumin -     Semaglutide, 1 MG/DOSE, (OZEMPIC, 1 MG/DOSE,) 2 MG/1.5ML SOPN; Inject 1 mg into the skin once a week.  Dark urine -     POCT UA - Microalbumin -     POCT Urinalysis Dipstick  Rash -  clotrimazole-betamethasone (LOTRISONE) cream; Apply 1 application topically 2 (two) times daily. -     Fungal Stain  Essential hypertension -     losartan (COZAAR) 100 MG tablet; Take 1 tablet (100 mg total) by mouth daily.  Need for hepatitis C screening test -     Hepatitis C antibody  Essential hypertension, benign  Psoriasis -     clobetasol cream (TEMOVATE) 0.05 %; Apply 1 application topically 2 (two) times daily.   .. Results for orders placed or performed in visit on 01/01/19  Fungal Stain   Specimen: Other  Result Value Ref Range   MICRO NUMBER: [id number]    SPECIMEN QUALITY: Adequate    Source: SCRAPINGS    STATUS: FINAL    FUNGAL SMEAR: No fungal elements seen.   Hepatitis C antibody  Result Value Ref Range   Hepatitis C Ab NON-REACTIVE NON-REACTI   SIGNAL TO CUT-OFF 0.03 <1.00  POCT glycosylated  hemoglobin (Hb A1C)  Result Value Ref Range   Hemoglobin A1C 10.6 (A) 4.0 - 5.6 %   HbA1c POC (<> result, manual entry)     HbA1c, POC (prediabetic range)     HbA1c, POC (controlled diabetic range)    POCT UA - Microalbumin  Result Value Ref Range   Microalbumin Ur, POC 150 mg/L   Creatinine, POC 200 mg/dL   Albumin/Creatinine Ratio, Urine, POC 30-300   POCT Urinalysis Dipstick  Result Value Ref Range   Color, UA amber    Clarity, UA clear    Glucose, UA Positive (A) Negative   Bilirubin, UA negative    Ketones, UA trace    Spec Grav, UA 1.020 1.010 - 1.025   Blood, UA trace-lysed    pH, UA 5.0 5.0 - 8.0   Protein, UA Positive (A) Negative   Urobilinogen, UA 0.2 0.2 or 1.0 E.U./dL   Nitrite, UA negative    Leukocytes, UA Negative Negative   Appearance     Odor     BP not to goal. Increased cozaar to 100mg .   A!C not to goal.  Switched trulicity to ozempic for superior a1c control.  Increased toujeo 20 units am and pm.  Discussed goal of 130 in morning and under 180 before bed.  Discussed diabetic diet and what foods to avoid.  Follow up in 3 months.   .. Diabetic Foot Exam - Simple   Simple Foot Form Diabetic Foot exam was performed with the following findings: Yes 01/01/2019  8:58 AM  Visual Inspection No deformities, no ulcerations, no other skin breakdown bilaterally: Yes See comments: Yes Sensation Testing Intact to touch and monofilament testing bilaterally: Yes Pulse Check Posterior Tibialis and Dorsalis pulse intact bilaterally: Yes Comments Broken nail on right big toe and psoriatic arthritis rash on top of right foot, otherwise normal.    Needs labs. Rash on abdomen. Likely psoriasis but looks a little like fungus. Will get KOH. Start with lortisone and if negative for fungus ok to switch to clobetasol.   Follow up for compliance and make sure sugars are trending down in 4 weeks.

## 2019-01-01 NOTE — Telephone Encounter (Signed)
Received fax from [employer] that Ozempic requires a PA. Information has been sent to the insurance company. Awaiting determination.   

## 2019-01-01 NOTE — Patient Instructions (Addendum)
GET Labs. Switch trulicity to ozempic 1mg  weekly.  toujeo 20 units am and pm. Goal under 130 in morning and under 180 before bed.  Increase cozaar to 100mg  daily.  Watch sugars/carbs.

## 2019-01-02 ENCOUNTER — Other Ambulatory Visit: Payer: Self-pay | Admitting: Physician Assistant

## 2019-01-02 DIAGNOSIS — R748 Abnormal levels of other serum enzymes: Secondary | ICD-10-CM

## 2019-01-02 LAB — HEPATITIS C ANTIBODY
Hepatitis C Ab: NONREACTIVE
SIGNAL TO CUT-OFF: 0.03 (ref <1.00)

## 2019-01-02 MED ORDER — ATORVASTATIN CALCIUM 40 MG PO TABS: 40.0000 mg | ORAL_TABLET | Freq: Every day | ORAL | 3 refills | Status: DC

## 2019-01-02 NOTE — Telephone Encounter (Signed)
Received fax from [employer] that Ozempic was approved from 01/01/2019 through 01/01/2020. Pharmacy notified and forms sent to scan.

## 2019-01-02 NOTE — Progress Notes (Signed)
Call Pt: Cholesterol elevated and not to goal. Increasing lipitor to 40mg  daily.  Kidney function is good.  Liver enzymes are up a bit. We need to get a liver u/s.   Can we add: hepatitis panel?

## 2019-01-03 LAB — FUNGAL STAIN
FUNGAL SMEAR:: NONE SEEN
MICRO NUMBER:: 739684
SPECIMEN QUALITY:: ADEQUATE

## 2019-01-03 MED ORDER — CLOBETASOL PROPIONATE 0.05 % EX CREA: 1.0000 "application " | TOPICAL_CREAM | Freq: Two times a day (BID) | CUTANEOUS | 2 refills | Status: AC

## 2019-01-03 NOTE — Progress Notes (Signed)
Skin rash was not fungus. He can stop lortisone and I will send over just topical steroid.

## 2019-01-06 LAB — COMPLETE METABOLIC PANEL WITH GFR
AG Ratio: 1.6 (calc) (ref 1.0–2.5)
ALT: 94 U/L — ABNORMAL HIGH (ref 9–46)
AST: 122 U/L — ABNORMAL HIGH (ref 10–35)
Albumin: 4.8 g/dL (ref 3.6–5.1)
Alkaline phosphatase (APISO): 78 U/L (ref 35–144)
BUN: 14 mg/dL (ref 7–25)
CO2: 28 mmol/L (ref 20–32)
Calcium: 9.5 mg/dL (ref 8.6–10.3)
Chloride: 93 mmol/L — ABNORMAL LOW (ref 98–110)
Creat: 0.93 mg/dL (ref 0.70–1.33)
GFR, Est African American: 107 mL/min/{1.73_m2} (ref >=60)
GFR, Est Non African American: 92 mL/min/{1.73_m2} (ref >=60)
Globulin: 3 g/dL (calc) (ref 1.9–3.7)
Glucose, Bld: 268 mg/dL — ABNORMAL HIGH (ref 65–99)
Potassium: 4.4 mmol/L (ref 3.5–5.3)
Sodium: 131 mmol/L — ABNORMAL LOW (ref 135–146)
Total Bilirubin: 0.8 mg/dL (ref 0.2–1.2)
Total Protein: 7.8 g/dL (ref 6.1–8.1)

## 2019-01-06 LAB — CBC WITH DIFFERENTIAL/PLATELET
Absolute Monocytes: 858 cells/uL (ref 200–950)
Basophils Absolute: 59 cells/uL (ref 0–200)
Basophils Relative: 0.9 %
Eosinophils Absolute: 137 cells/uL (ref 15–500)
Eosinophils Relative: 2.1 %
HCT: 45.7 % (ref 38.5–50.0)
Hemoglobin: 15.9 g/dL (ref 13.2–17.1)
Lymphs Abs: 1183 cells/uL (ref 850–3900)
MCH: 34.2 pg — ABNORMAL HIGH (ref 27.0–33.0)
MCHC: 34.8 g/dL (ref 32.0–36.0)
MCV: 98.3 fL (ref 80.0–100.0)
MPV: 9.7 fL (ref 7.5–12.5)
Monocytes Relative: 13.2 %
Neutro Abs: 4264 cells/uL (ref 1500–7800)
Neutrophils Relative %: 65.6 %
Platelets: 143 10*3/uL (ref 140–400)
RBC: 4.65 10*6/uL (ref 4.20–5.80)
RDW: 13.1 % (ref 11.0–15.0)
Total Lymphocyte: 18.2 %
WBC: 6.5 10*3/uL (ref 3.8–10.8)

## 2019-01-06 LAB — PSA: PSA: 0.4 ng/mL (ref <=4.0)

## 2019-01-06 LAB — ACUTE HEP PANEL AND HEP B SURFACE AB
HEPATITIS C ANTIBODY REFILL$(REFL): NONREACTIVE
Hep A IgM: NONREACTIVE
Hep B C IgM: NONREACTIVE
Hepatitis B Surface Ag: NONREACTIVE
SIGNAL TO CUT-OFF: 0.03 (ref <1.00)

## 2019-01-06 LAB — LIPID PANEL W/REFLEX DIRECT LDL
Cholesterol: 258 mg/dL — ABNORMAL HIGH (ref <200)
HDL: 38 mg/dL — ABNORMAL LOW (ref >=40)
LDL Cholesterol (Calc): 171 mg/dL (calc) — ABNORMAL HIGH
Non-HDL Cholesterol (Calc): 220 mg/dL (calc) — ABNORMAL HIGH (ref <130)
Total CHOL/HDL Ratio: 6.8 (calc) — ABNORMAL HIGH (ref <5.0)
Triglycerides: 276 mg/dL — ABNORMAL HIGH (ref <150)

## 2019-01-06 LAB — HEMOGLOBIN A1C
Hgb A1c MFr Bld: 10.9 % of total Hgb — ABNORMAL HIGH (ref <5.7)
Mean Plasma Glucose: 266 (calc)
eAG (mmol/L): 14.7 (calc)

## 2019-01-06 LAB — REFLEX TIQ

## 2019-01-14 ENCOUNTER — Other Ambulatory Visit: Payer: Self-pay | Admitting: Physician Assistant

## 2019-01-14 DIAGNOSIS — E1129 Type 2 diabetes mellitus with other diabetic kidney complication: Secondary | ICD-10-CM

## 2019-01-14 DIAGNOSIS — Z794 Long term (current) use of insulin: Secondary | ICD-10-CM

## 2019-01-22 ENCOUNTER — Other Ambulatory Visit: Payer: Self-pay

## 2019-01-22 ENCOUNTER — Ambulatory Visit (INDEPENDENT_AMBULATORY_CARE_PROVIDER_SITE_OTHER): Payer: Commercial Managed Care - PPO | Admitting: Physician Assistant

## 2019-01-22 VITALS — BP 148/82 | HR 105 | Ht 71.0 in | Wt 216.0 lb

## 2019-01-22 DIAGNOSIS — Z794 Long term (current) use of insulin: Secondary | ICD-10-CM

## 2019-01-22 DIAGNOSIS — R809 Proteinuria, unspecified: Secondary | ICD-10-CM | POA: Diagnosis not present

## 2019-01-22 DIAGNOSIS — I10 Essential (primary) hypertension: Secondary | ICD-10-CM

## 2019-01-22 DIAGNOSIS — R42 Dizziness and giddiness: Secondary | ICD-10-CM | POA: Diagnosis not present

## 2019-01-22 DIAGNOSIS — E1129 Type 2 diabetes mellitus with other diabetic kidney complication: Secondary | ICD-10-CM

## 2019-01-22 MED ORDER — HYDROCHLOROTHIAZIDE 12.5 MG PO TABS: 12.5000 mg | ORAL_TABLET | Freq: Every day | ORAL | 2 refills | Status: DC

## 2019-01-22 MED ORDER — XIGDUO XR 10-1000 MG PO TB24: 1.0000 | ORAL_TABLET | Freq: Every day | ORAL | 2 refills | Status: DC

## 2019-01-22 NOTE — Progress Notes (Signed)
Subjective:    Patient ID: Robert Frey, male    DOB: December 17, 1960, 58 y.o.   MRN: [id number]  HPI  Pt is a 58 yo male with T2DM, HLD, HTN who presents to the clinic with episode of dizziness that started yesterday. He forget that he had already taken BP medication and took 2 doses of losaartan. He began to feel jittery and like he was present but not present. He reported "an out of body experience". He worked all day and then work up this morning still feeling the same way. About 4 hours ago all symptoms resolved. No other medications changes. He is taking his insulin toujeo 20mg  bid. He is checking sugars and still in 200's but seem to be better.   He feels completely fine now.   .. Active Ambulatory Problems    Diagnosis Date Noted  . HYPERCHOLESTEROLEMIA 03/06/2006  . OBESITY, NOS 03/06/2006  . OBSESSIVE COMPUL. DISORDER 03/06/2006  . KNEE PAIN, BILATERAL 12/28/2006  . ANKYLOSING SPONDYLITIS 09/30/2009  . Psoriasis 10/11/2012  . Type 2 diabetes mellitus with microalbuminuria, with long-term current use of insulin ([place]) 10/11/2012  . Heel spur 01/20/2014  . Osteoarthritis of foot, left 01/20/2014  . Lung nodule < 6cm on CT 05/15/2014  . Essential hypertension 06/08/2015  . Microalbuminuria 06/08/2015  . Bruising 06/09/2015  . Lumbosacral strain 01/17/2017  . Dark urine 01/01/2019   Resolved Ambulatory Problems    Diagnosis Date Noted  . ELEVATED BLOOD PRESSURE WITHOUT DIAGNOSIS OF HYPERTENSION 08/29/2006  . Cough 11/25/2010   Past Medical History:  Diagnosis Date  . COPD (chronic obstructive pulmonary disease) ([place])   . Diabetes mellitus without complication ([place])      Review of Systems See HPI.     Objective:   Physical Exam Vitals signs reviewed.  Constitutional:      Appearance: Normal appearance.  HENT:     Head: Normocephalic.  Eyes:     Extraocular Movements: Extraocular movements intact.     Pupils: Pupils are equal, round, and reactive to light.   Cardiovascular:     Rate and Rhythm: Normal rate and regular rhythm.     Pulses: Normal pulses.  Pulmonary:     Effort: Pulmonary effort is normal.     Breath sounds: Normal breath sounds.  Neurological:     General: No focal deficit present.     Mental Status: He is alert and oriented to person, place, and time.     Motor: No weakness.     Gait: Gait normal.     Deep Tendon Reflexes: Reflexes normal.     Comments: Negative dixhallpike.   Psychiatric:        Mood and Affect: Mood normal.           Assessment & Plan:  Marland KitchenMarland KitchenAskia was seen today for dizziness.  Diagnoses and all orders for this visit:  Dizziness  Type 2 diabetes mellitus with microalbuminuria, with long-term current use of insulin ([name]) -     Dapagliflozin-metFORMIN HCl ER (XIGDUO XR) 02-999 MG TB24; Take 1 tablet by mouth daily.  Essential hypertension  Other orders -     hydrochlorothiazide (HYDRODIURIL) 12.5 MG tablet; Take 1 tablet (12.5 mg total) by mouth daily.   Likely dizziness did come from taking BP medication twice. BP elevated today wonder if some of it is rebound. Add hctz to losaartan.discussed side effects. Keep monitoring BP. Follow up in 2 weeks. No signs of BPPV on exam today.    Went over  DM medications. Pt was not taking xigduo. Refilled and told to start.

## 2019-01-22 NOTE — Patient Instructions (Signed)
For Blood pressure continue losaartan 100mg  and added HCTZ 12.5mg .   Make sure to restart xigduo that was sent to the pharmacy daily.   Recheck BP in 2 weeks.

## 2019-01-24 ENCOUNTER — Encounter: Payer: Self-pay | Admitting: Physician Assistant

## 2019-01-29 ENCOUNTER — Telehealth: Payer: Self-pay | Admitting: Neurology

## 2019-01-29 ENCOUNTER — Ambulatory Visit (HOSPITAL_COMMUNITY)
Admission: RE | Admit: 2019-01-29 | Discharge: 2019-01-29 | Disposition: A | Discharge status: Home / Self Care | Payer: Commercial Managed Care - PPO | Source: Ambulatory Visit | Attending: Physician Assistant | Admitting: Physician Assistant

## 2019-01-29 ENCOUNTER — Other Ambulatory Visit: Payer: Self-pay

## 2019-01-29 DIAGNOSIS — R748 Abnormal levels of other serum enzymes: Secondary | ICD-10-CM | POA: Insufficient documentation

## 2019-01-29 NOTE — Telephone Encounter (Signed)
Ok so take the 50mg (2 cozaar) at the same time in the am with HCTZ. BP is still elevated. We must get this down. Let me know BP readings and how you feel taking it this week by next Tuesday.

## 2019-01-29 NOTE — Progress Notes (Signed)
Low risk of fibrosis of liver. We will continue to work on low fat diet and weight loss to see if weight loss could help liver enzymes.

## 2019-01-29 NOTE — Telephone Encounter (Signed)
Patient states when he takes Losartan twice daily it makes him "jittery". Losartan is written to take 100 mg once daily with HCTZ added last visit. He states he is taking HCTZ, but his bottle states to take Losartan twice daily, but this is 25 mg tablets. He was last prescribed this is April. He feels good when he takes 25 mg once daily. He checked blood pressure last week and reading was around 156/110. Please advise.

## 2019-01-29 NOTE — Telephone Encounter (Signed)
Patient made aware and he is agreeable to try. Will call him next week to check on his blood pressure.

## 2019-01-29 NOTE — Telephone Encounter (Signed)
Called patient back. He was trying to figure out where to go for his ultrasound but he was able to figure it out. Will call back if needed.

## 2019-01-29 NOTE — Telephone Encounter (Signed)
Left vm with name, phone number, DOB and no other information. Asked for call back.

## 2019-02-05 ENCOUNTER — Ambulatory Visit: Payer: Commercial Managed Care - PPO | Admitting: Physician Assistant

## 2019-03-29 ENCOUNTER — Other Ambulatory Visit: Payer: Self-pay | Admitting: Physician Assistant

## 2019-03-29 DIAGNOSIS — I10 Essential (primary) hypertension: Secondary | ICD-10-CM

## 2019-04-02 ENCOUNTER — Ambulatory Visit (INDEPENDENT_AMBULATORY_CARE_PROVIDER_SITE_OTHER): Payer: Commercial Managed Care - PPO | Admitting: Physician Assistant

## 2019-04-02 ENCOUNTER — Other Ambulatory Visit: Payer: Self-pay

## 2019-04-02 ENCOUNTER — Encounter: Payer: Self-pay | Admitting: Physician Assistant

## 2019-04-02 VITALS — BP 181/84 | HR 108 | Ht 72.0 in | Wt 208.0 lb

## 2019-04-02 DIAGNOSIS — I10 Essential (primary) hypertension: Secondary | ICD-10-CM | POA: Diagnosis not present

## 2019-04-02 DIAGNOSIS — L409 Psoriasis, unspecified: Secondary | ICD-10-CM | POA: Diagnosis not present

## 2019-04-02 DIAGNOSIS — Z794 Long term (current) use of insulin: Secondary | ICD-10-CM

## 2019-04-02 DIAGNOSIS — R251 Tremor, unspecified: Secondary | ICD-10-CM | POA: Diagnosis not present

## 2019-04-02 DIAGNOSIS — R809 Proteinuria, unspecified: Secondary | ICD-10-CM

## 2019-04-02 DIAGNOSIS — E1129 Type 2 diabetes mellitus with other diabetic kidney complication: Secondary | ICD-10-CM | POA: Diagnosis not present

## 2019-04-02 MED ORDER — AMLODIPINE BESYLATE 2.5 MG PO TABS: 2.5000 mg | ORAL_TABLET | Freq: Every day | ORAL | 0 refills | Status: DC

## 2019-04-02 MED ORDER — XIGDUO XR 10-1000 MG PO TB24: 1.0000 | ORAL_TABLET | Freq: Every day | ORAL | 2 refills | Status: DC

## 2019-04-02 MED ORDER — TOUJEO MAX SOLOSTAR 300 UNIT/ML ~~LOC~~ SOPN: 20.0000 [IU] | PEN_INJECTOR | Freq: Every day | SUBCUTANEOUS | 1 refills | Status: DC

## 2019-04-02 MED ORDER — HYDROCHLOROTHIAZIDE 25 MG PO TABS: 25.0000 mg | ORAL_TABLET | Freq: Every day | ORAL | 1 refills | Status: DC

## 2019-04-02 NOTE — Progress Notes (Signed)
Subjective:    Patient ID: Robert Frey, male    DOB: 11-04-1960, 58 y.o.   MRN: [id number]  HPI  Pt is a 58 yo male with T2DM, HTN, psoriasis who presents to the clinic for follow up.   His BP has not been able to be managed. He developed whole body shakes for last 2 days but much better this morning. He is checking his sugars and not low when he is having the shakes. Nothing makes them better. He finds it difficult to write or have any fine motor skills when this occurs. No CP, palpitations, headaches, muscles aches.    Pt is ready for treatment for psoriasis. It seems to be getting worse. He was on treatment before but just stopped because he did not think it was bad enough. Now he feels like rash is bad enough.   .. Active Ambulatory Problems    Diagnosis Date Noted  . HYPERCHOLESTEROLEMIA 03/06/2006  . OBESITY, NOS 03/06/2006  . OBSESSIVE COMPUL. DISORDER 03/06/2006  . KNEE PAIN, BILATERAL 12/28/2006  . ANKYLOSING SPONDYLITIS 09/30/2009  . Psoriasis 10/11/2012  . Type 2 diabetes mellitus with microalbuminuria, with long-term current use of insulin ([place]) 10/11/2012  . Heel spur 01/20/2014  . Osteoarthritis of foot, left 01/20/2014  . Lung nodule < 6cm on CT 05/15/2014  . Essential hypertension 06/08/2015  . Microalbuminuria 06/08/2015  . Bruising 06/09/2015  . Lumbosacral strain 01/17/2017  . Dark urine 01/01/2019  . Shakes 04/04/2019   Resolved Ambulatory Problems    Diagnosis Date Noted  . ELEVATED BLOOD PRESSURE WITHOUT DIAGNOSIS OF HYPERTENSION 08/29/2006  . Cough 11/25/2010   Past Medical History:  Diagnosis Date  . COPD (chronic obstructive pulmonary disease) ([place])   . Diabetes mellitus without complication ([place])       Review of Systems See HPI.     Objective:   Physical Exam Vitals signs reviewed.  Constitutional:      Appearance: Normal appearance.  HENT:     Head: Normocephalic.  Cardiovascular:     Rate and Rhythm: Normal rate and regular  rhythm.  Pulmonary:     Effort: Pulmonary effort is normal.     Breath sounds: Normal breath sounds.  Skin:    Comments: Raised erythematous scaly plaques over entire body.   Neurological:     General: No focal deficit present.     Mental Status: He is alert and oriented to person, place, and time.     Comments: No tremors or shakes today.   Psychiatric:        Mood and Affect: Mood normal.           Assessment & Plan:  Marland KitchenMarland KitchenRafeeq was seen today for hypertension.  Diagnoses and all orders for this visit:  Shakes  Type 2 diabetes mellitus with microalbuminuria, with long-term current use of insulin ([name]) -     Insulin Glargine, 2 Unit Dial, (TOUJEO MAX SOLOSTAR) 300 UNIT/ML SOPN; Inject 20 Units into the skin at bedtime. -     Dapagliflozin-metFORMIN HCl ER (XIGDUO XR) 02-999 MG TB24; Take 1 tablet by mouth daily. -     COMPLETE METABOLIC PANEL WITH GFR -     Hemoglobin A1c  Essential hypertension -     amLODipine (NORVASC) 2.5 MG tablet; Take 1 tablet (2.5 mg total) by mouth daily. -     hydrochlorothiazide (HYDRODIURIL) 25 MG tablet; Take 1 tablet (25 mg total) by mouth daily.  Psoriasis -     Ambulatory referral to  Dermatology   Note for work given.  BP still not controlled added norvasc 2.5mg . discussed side effects.  2 week follow up.  Unclear of etiology of shaking. Could be BP related. Sounds like essential tremor. Will continue to monitor.   Refilled DM medications. Not yet time for A1C. Printed labs. Keep check on sugars. Adjust toujeo as needed to get fasting sugars below 130.   Referral made to dermatology.

## 2019-04-03 ENCOUNTER — Telehealth: Payer: Self-pay | Admitting: Neurology

## 2019-04-03 NOTE — Telephone Encounter (Signed)
Patient left vm with his blood pressure reading as instructed: 151/82. He still has the shakes. FYI.

## 2019-04-04 ENCOUNTER — Encounter: Payer: Self-pay | Admitting: Physician Assistant

## 2019-04-04 DIAGNOSIS — R251 Tremor, unspecified: Secondary | ICD-10-CM | POA: Insufficient documentation

## 2019-04-04 NOTE — Telephone Encounter (Signed)
Spoke with patient. He states readings this morning were 170/99 and 163/91. He has felt great all day. No shakes. When he felt bad yesterday his blood sugar was 280. He will increase Norvasc to 5 mg daily and keep a blood pressure log. Will call us with any further issues.

## 2019-04-04 NOTE — Telephone Encounter (Signed)
BP improved from last visit. Confirm reading this morning if above 140/90 increase norvasc to 5mg  daily. What is your sugar running when you get the shakes?

## 2019-04-04 NOTE — Telephone Encounter (Signed)
Tried to call patient, he answered and stated he needed to call back.

## 2019-04-16 ENCOUNTER — Other Ambulatory Visit: Payer: Self-pay

## 2019-04-16 ENCOUNTER — Ambulatory Visit (INDEPENDENT_AMBULATORY_CARE_PROVIDER_SITE_OTHER): Payer: Commercial Managed Care - PPO | Admitting: Physician Assistant

## 2019-04-16 ENCOUNTER — Encounter: Payer: Self-pay | Admitting: Physician Assistant

## 2019-04-16 VITALS — BP 144/73 | HR 105 | Ht 72.0 in | Wt 209.0 lb

## 2019-04-16 DIAGNOSIS — Z794 Long term (current) use of insulin: Secondary | ICD-10-CM

## 2019-04-16 DIAGNOSIS — I10 Essential (primary) hypertension: Secondary | ICD-10-CM

## 2019-04-16 DIAGNOSIS — R809 Proteinuria, unspecified: Secondary | ICD-10-CM | POA: Diagnosis not present

## 2019-04-16 DIAGNOSIS — E1129 Type 2 diabetes mellitus with other diabetic kidney complication: Secondary | ICD-10-CM | POA: Diagnosis not present

## 2019-04-16 DIAGNOSIS — G250 Essential tremor: Secondary | ICD-10-CM | POA: Insufficient documentation

## 2019-04-16 LAB — POCT GLYCOSYLATED HEMOGLOBIN (HGB A1C): Hemoglobin A1C: 7.3 % — AB (ref 4.0–5.6)

## 2019-04-16 MED ORDER — PROPRANOLOL HCL 10 MG PO TABS: 10.0000 mg | ORAL_TABLET | Freq: Two times a day (BID) | ORAL | 2 refills | Status: DC

## 2019-04-16 MED ORDER — OZEMPIC (1 MG/DOSE) 2 MG/1.5ML ~~LOC~~ SOPN: 1.0000 mg | PEN_INJECTOR | SUBCUTANEOUS | 2 refills | Status: DC

## 2019-04-16 MED ORDER — FREESTYLE LIBRE 14 DAY READER DEVI: 1.0000 | 11 refills | Status: DC

## 2019-04-16 MED ORDER — LOSARTAN POTASSIUM 100 MG PO TABS: 100.0000 mg | ORAL_TABLET | Freq: Every day | ORAL | 0 refills | Status: DC

## 2019-04-16 MED ORDER — FREESTYLE LIBRE 14 DAY SENSOR MISC: 1.0000 | 11 refills | Status: DC

## 2019-04-16 MED ORDER — AMLODIPINE BESYLATE 5 MG PO TABS: 5.0000 mg | ORAL_TABLET | Freq: Every day | ORAL | 3 refills | Status: DC

## 2019-04-16 NOTE — Patient Instructions (Signed)

## 2019-04-16 NOTE — Progress Notes (Signed)
Subjective:    Patient ID: ZAMIRE TIET, male    DOB: 08-Jan-1961, 58 y.o.   MRN: [id number]  HPI  Patient is a 58 year old male with hypertension and type 2 diabetes who presents to the clinic for 2-week follow-up.  His blood pressure has been uncontrolled for the past few months.  He is now taking losartan 100 mg, HCTZ 25 mg, Norvasc 2.5 mg.  He is very compliant with his medication.  His blood pressures have improved significantly.  He is checking them occasionally.  They seem to be running in the 140s over 70s.  He denies any chest pains or palpitations.  He does think his episodes of severe body shakes was due to his elevated blood pressure.  He has not had any headaches.  He is not having any overall body shakes but he still has a tremor of both hands.  He denies any family history of Parkinson's.  He does admit that his mom has a similar tremor.  Patient is checking his sugars fairly regularly.  He is running 1 30-1 38 in the mornings and 1 30-1 50s in the evenings.  He is taking Toujeo 20 units morning and night.  He has only Ozempic 1 mg weekly and Xigduo XR daily.   He reports he is feeling a lot better.  .. Active Ambulatory Problems    Diagnosis Date Noted  . HYPERCHOLESTEROLEMIA 03/06/2006  . OBESITY, NOS 03/06/2006  . OBSESSIVE COMPUL. DISORDER 03/06/2006  . KNEE PAIN, BILATERAL 12/28/2006  . ANKYLOSING SPONDYLITIS 09/30/2009  . Psoriasis 10/11/2012  . Type 2 diabetes mellitus with microalbuminuria, with long-term current use of insulin ([place]) 10/11/2012  . Heel spur 01/20/2014  . Osteoarthritis of foot, left 01/20/2014  . Lung nodule < 6cm on CT 05/15/2014  . Essential hypertension 06/08/2015  . Microalbuminuria 06/08/2015  . Bruising 06/09/2015  . Lumbosacral strain 01/17/2017  . Dark urine 01/01/2019  . Shakes 04/04/2019  . Essential tremor 04/16/2019   Resolved Ambulatory Problems    Diagnosis Date Noted  . ELEVATED BLOOD PRESSURE WITHOUT DIAGNOSIS OF  HYPERTENSION 08/29/2006  . Cough 11/25/2010   Past Medical History:  Diagnosis Date  . COPD (chronic obstructive pulmonary disease) ([place])   . Diabetes mellitus without complication ([place])       Review of Systems  All other systems reviewed and are negative.      Objective:   Physical Exam Vitals signs reviewed.  Constitutional:      Appearance: Normal appearance.  Cardiovascular:     Rate and Rhythm: Normal rate and regular rhythm.     Pulses: Normal pulses.  Pulmonary:     Effort: Pulmonary effort is normal.     Breath sounds: Normal breath sounds.  Neurological:     General: No focal deficit present.     Mental Status: He is alert and oriented to person, place, and time.     Motor: No weakness.     Coordination: Coordination normal.     Gait: Gait normal.     Comments: Bilateral hand tremor with intention. Shakiness with nose to finger testing.   Psychiatric:        Mood and Affect: Mood normal.     .. Results for orders placed or performed in visit on 04/16/19  POCT glycosylated hemoglobin (Hb A1C)  Result Value Ref Range   Hemoglobin A1C 7.3 (A) 4.0 - 5.6 %   HbA1c POC (<> result, manual entry)     HbA1c, POC (prediabetic range)  HbA1c, POC (controlled diabetic range)           Assessment & Plan:  Marland KitchenMarland KitchenReginaldo was seen today for follow-up.  Diagnoses and all orders for this visit:  Type 2 diabetes mellitus with microalbuminuria, with long-term current use of insulin ([name]) -     POCT glycosylated hemoglobin (Hb A1C) -     Continuous Blood Gluc Sensor (FREESTYLE LIBRE 14 DAY SENSOR) MISC; 1 Device by Does not apply route every 14 (fourteen) days. -     Continuous Blood Gluc Receiver (FREESTYLE LIBRE 14 DAY READER) DEVI; 1 applicator by Does not apply route every 14 (fourteen) days. -     Ambulatory referral to Ophthalmology -     Semaglutide, 1 MG/DOSE, (OZEMPIC, 1 MG/DOSE,) 2 MG/1.5ML SOPN; Inject 1 mg into the skin once a week.  Essential tremor -      propranolol (INDERAL) 10 MG tablet; Take 1 tablet (10 mg total) by mouth 2 (two) times daily.  Essential hypertension -     amLODipine (NORVASC) 5 MG tablet; Take 1 tablet (5 mg total) by mouth daily. -     losartan (COZAAR) 100 MG tablet; Take 1 tablet (100 mg total) by mouth daily.    A1C has improved.  Stay on same medication.  Discussed toujeo increase at bedtime to get fasting sugars to 90-130.  On STATIN.  On ARB.  Needs eye exam. Referral made today.  Flu and pneumonia vaccine UTD.   Increased norvasc to 5mg  to get BP to goal of under 130/80.   Added propranolol for tremor 10mg  bid. HO given. No signs of parkinson tremor or gait change.   Follow up in 3 months.

## 2019-04-24 ENCOUNTER — Other Ambulatory Visit: Payer: Self-pay | Admitting: Physician Assistant

## 2019-04-24 DIAGNOSIS — I10 Essential (primary) hypertension: Secondary | ICD-10-CM

## 2019-04-28 ENCOUNTER — Telehealth: Payer: Self-pay | Admitting: Neurology

## 2019-04-28 NOTE — Telephone Encounter (Signed)
Patient left vm stating he was sick last week and missed work. He was tested for Covid and was negative. Needs note to return to work. Okay to write?

## 2019-04-28 NOTE — Telephone Encounter (Signed)
Letter written, he will pick up at the front desk.

## 2019-04-28 NOTE — Telephone Encounter (Signed)
Ok to write that he should be excused for absences due to covid testing.

## 2019-04-29 ENCOUNTER — Telehealth: Payer: Self-pay | Admitting: Physician Assistant

## 2019-04-29 MED ORDER — PRIMIDONE 50 MG PO TABS: ORAL_TABLET | ORAL | 2 refills | Status: DC

## 2019-04-29 NOTE — Telephone Encounter (Signed)
Patient called and reports that he has been having some vomiting since he started his Propanolol. He was tested for Covid over a week ago and it was negative. He is going to stop taking the Propanolol. He is wanting something else in its place since its making him vomit. He is not having any other side effects. He took a Zofran earlier today and could not keep it down. Please advise.

## 2019-04-29 NOTE — Telephone Encounter (Signed)
Sent primidone 1/2 tablet for 7 days at bedtime then increase to 1 full tablet.

## 2019-04-30 ENCOUNTER — Encounter: Payer: Self-pay | Admitting: Osteopathic Medicine

## 2019-04-30 ENCOUNTER — Ambulatory Visit (INDEPENDENT_AMBULATORY_CARE_PROVIDER_SITE_OTHER): Payer: Commercial Managed Care - PPO | Admitting: Osteopathic Medicine

## 2019-04-30 VITALS — BP 140/78 | HR 91 | Temp 98.6°F | Wt 207.0 lb

## 2019-04-30 DIAGNOSIS — R112 Nausea with vomiting, unspecified: Secondary | ICD-10-CM

## 2019-04-30 NOTE — Progress Notes (Signed)
Virtual Visit via Video (App used: Doximity) Note  I connected with      [name] on 04/30/19 at 3:38 PM by a telemedicine application and verified that I am speaking with the correct person using two identifiers.  Patient is at home I am in office   I discussed the limitations of evaluation and management by telemedicine and the availability of in person appointments. The patient expressed understanding and agreed to proceed.  History of Present Illness: Robert Frey is a 58 y.o. male who would like to discuss nausea    Sunday feeling sick Monday tested for COVID This was negative Tuesday went back to work Thinks it might have ben the propranol Nausea and loss of appetite Has had to miss work last week and this Monday He reports he needs note to return to work tomorrow  Has felt fine other than some nausea this morning which improved w/ food     Observations/Objective: There were no vitals taken for this visit. BP Readings from Last 3 Encounters:  04/16/19 (!) 144/73  04/02/19 (!) 181/84  01/22/19 (!) 148/82   Exam: Normal Speech.  NAD  Lab and Radiology Results No results found for this or any previous visit (from the past 72 hour(s)). No results found.     Assessment and Plan: 58 y.o. male with The encounter diagnosis was Non-intractable vomiting with nausea, unspecified vomiting type.  Possible medication effect from propranolol vs viral gastroenteritis, no myalgia fatigue or respiratory problems   Pt was sent text w/ instruction to sign up for MyChart to view/print letter, and we will also mail the letter   Follow Up Instructions: Return for as directed for routien care w/ PCP / as needed .    I discussed the assessment and treatment plan with the patient. The patient was provided an opportunity to ask questions and all were answered. The patient agreed with the plan and demonstrated an understanding of the instructions.   The patient was  advised to call back or seek an in-person evaluation if any new concerns, if symptoms worsen or if the condition fails to improve as anticipated.  10 minutes of non-face-to-face time was provided during this encounter.      . . . . . . . . . . . . . Marland Kitchen                   Historical information moved to improve visibility of documentation.  Past Medical History:  Diagnosis Date  . Ankylosing spondylitis ([place])   . COPD (chronic obstructive pulmonary disease) ([place])   . Diabetes mellitus without complication [facility])    Past Surgical History:  Procedure Laterality Date  . APPENDECTOMY    . CARPAL TUNNEL RELEASE Right   . ent surgery    . TONSILLECTOMY AND ADENOIDECTOMY     Social History   Tobacco Use  . Smoking status: Former Smoker    Quit date: 08/13/2004    Years since quitting: 14.7  . Smokeless tobacco: Never Used  Substance Use Topics  . Alcohol use: No   family history includes Alcohol abuse in an other family member; Depression in an other family member; Diabetes in his mother; Hyperlipidemia in an other family member; Hypertension in an other family member; Stroke in an other family member.  Medications: Current Outpatient Medications  Medication Sig Dispense Refill  . amLODipine (NORVASC) 5 MG tablet Take 1 tablet (5 mg total) by mouth daily. [place]  tablet 3  . atorvastatin (LIPITOR) 40 MG tablet Take 1 tablet (40 mg total) by mouth daily. 90 tablet 3  . clobetasol cream (TEMOVATE) [id number] % Apply 1 application topically 2 (two) times daily. 80 g 2  . clotrimazole-betamethasone (LOTRISONE) cream Apply 1 application topically 2 (two) times daily. 90 g 0  . Continuous Blood Gluc Receiver (FREESTYLE LIBRE 14 DAY READER) DEVI 1 applicator by Does not apply route every 14 (fourteen) days. 2 each 11  . Continuous Blood Gluc Sensor (FREESTYLE LIBRE 14 DAY SENSOR) MISC 1 Device by Does not apply route every 14 (fourteen) days. 2 each 11  .  Dapagliflozin-metFORMIN HCl ER (XIGDUO XR) 02-999 MG TB24 Take 1 tablet by mouth daily. 30 tablet 2  . hydrochlorothiazide (HYDRODIURIL) 25 MG tablet Take 1 tablet (25 mg total) by mouth daily. 90 tablet 1  . imipramine (TOFRANIL) 50 MG tablet Take 4 tablets (200 mg total) by mouth at bedtime. 360 tablet 3  . Insulin Glargine, 2 Unit Dial, (TOUJEO MAX SOLOSTAR) 300 UNIT/ML SOPN Inject 20 Units into the skin at bedtime. 6 pen 1  . Insulin Pen Needle (PEN NEEDLES) 32G X 4 MM MISC Inject 1 Units into the skin daily. 100 each 99  . losartan (COZAAR) 100 MG tablet Take 1 tablet (100 mg total) by mouth daily. 90 tablet 0  . primidone (MYSOLINE) 50 MG tablet Take 1/2 tablet at bedtime for 7 days then increase to 1 full tablet. 30 tablet 2  . Semaglutide, 1 MG/DOSE, (OZEMPIC, 1 MG/DOSE,) 2 MG/1.5ML SOPN Inject 1 mg into the skin once a week. 2 pen 2   No current facility-administered medications for this visit.    Allergies  Allergen Reactions  . Green Dyes Hives  . Propranolol     Nausea/vomiting  . Zithromax [Azithromycin Dihydrate] Rash

## 2019-04-30 NOTE — Telephone Encounter (Signed)
Patient advised.

## 2019-05-01 ENCOUNTER — Encounter: Payer: Self-pay | Admitting: Osteopathic Medicine

## 2019-05-20 ENCOUNTER — Other Ambulatory Visit: Payer: Self-pay | Admitting: Physician Assistant

## 2019-05-20 DIAGNOSIS — I10 Essential (primary) hypertension: Secondary | ICD-10-CM

## 2019-06-03 ENCOUNTER — Ambulatory Visit (INDEPENDENT_AMBULATORY_CARE_PROVIDER_SITE_OTHER): Payer: Commercial Managed Care - PPO | Admitting: Physician Assistant

## 2019-06-03 ENCOUNTER — Ambulatory Visit (INDEPENDENT_AMBULATORY_CARE_PROVIDER_SITE_OTHER): Payer: Commercial Managed Care - PPO

## 2019-06-03 ENCOUNTER — Encounter: Payer: Self-pay | Admitting: Physician Assistant

## 2019-06-03 ENCOUNTER — Other Ambulatory Visit: Payer: Self-pay

## 2019-06-03 VITALS — BP 166/72 | HR 100 | Ht 72.0 in | Wt 210.0 lb

## 2019-06-03 DIAGNOSIS — M25511 Pain in right shoulder: Secondary | ICD-10-CM | POA: Diagnosis not present

## 2019-06-03 DIAGNOSIS — S4991XA Unspecified injury of right shoulder and upper arm, initial encounter: Secondary | ICD-10-CM | POA: Diagnosis not present

## 2019-06-03 MED ORDER — MELOXICAM 15 MG PO TABS: 15.0000 mg | ORAL_TABLET | Freq: Every day | ORAL | 0 refills | Status: DC

## 2019-06-03 MED ORDER — HYDROCODONE-ACETAMINOPHEN 5-325 MG PO TABS: 1.0000 | ORAL_TABLET | Freq: Four times a day (QID) | ORAL | 0 refills | Status: DC | PRN

## 2019-06-03 NOTE — Progress Notes (Signed)
Subjective:    Patient ID: Robert Frey, male    DOB: 1961-05-09, 59 y.o.   MRN: [id number]  HPI  Patient is a 59 year old male who comes into the clinic with right shoulder pain after direct impact injury last night.  He reports that he was very tired after a long day of work and when he got out of bed his legs were a little weak and buckled and he fell into wall/door frame.  He describes falling into the top part of his shoulder.  He is in significant pain today. Rates 9/10 at times. Most range of motions of the shoulder create pain.  He comes in holding his arm for support.  He has not tried anything to make better.  .. Active Ambulatory Problems    Diagnosis Date Noted  . HYPERCHOLESTEROLEMIA 03/06/2006  . OBESITY, NOS 03/06/2006  . OBSESSIVE COMPUL. DISORDER 03/06/2006  . KNEE PAIN, BILATERAL 12/28/2006  . ANKYLOSING SPONDYLITIS 09/30/2009  . Psoriasis 10/11/2012  . Type 2 diabetes mellitus with microalbuminuria, with long-term current use of insulin ([place]) 10/11/2012  . Heel spur 01/20/2014  . Osteoarthritis of foot, left 01/20/2014  . Lung nodule < 6cm on CT 05/15/2014  . Essential hypertension 06/08/2015  . Microalbuminuria 06/08/2015  . Bruising 06/09/2015  . Lumbosacral strain 01/17/2017  . Dark urine 01/01/2019  . Shakes 04/04/2019  . Essential tremor 04/16/2019   Resolved Ambulatory Problems    Diagnosis Date Noted  . ELEVATED BLOOD PRESSURE WITHOUT DIAGNOSIS OF HYPERTENSION 08/29/2006  . Cough 11/25/2010   Past Medical History:  Diagnosis Date  . COPD (chronic obstructive pulmonary disease) ([place])   . Diabetes mellitus without complication ([place])     Review of Systems See HPI.     Objective:   Physical Exam Vitals reviewed.  Constitutional:      Appearance: Normal appearance.  Cardiovascular:     Rate and Rhythm: Normal rate and regular rhythm.     Pulses: Normal pulses.  Pulmonary:     Effort: Pulmonary effort is normal.  Musculoskeletal:   Comments: Right shoulder: Visible am able to be palpated step-off of AC joint.  Pinpoint tenderness to palpation over AC joint. Very limited active and passive range of motion due to pain. Hand grip 5/5.   Neurological:     General: No focal deficit present.     Mental Status: He is alert and oriented to person, place, and time.  Psychiatric:        Mood and Affect: Mood normal.           Assessment & Plan:  Robert Frey KitchenMarland KitchenPesach was seen today for shoulder injury.  Diagnoses and all orders for this visit:  Arthralgia of right acromioclavicular joint -     meloxicam (MOBIC) 15 MG tablet; Take 1 tablet (15 mg total) by mouth daily. -     HYDROcodone-acetaminophen (NORCO/VICODIN) 5-325 MG tablet; Take 1 tablet by mouth every 6 (six) hours as needed for up to 5 days for moderate pain.  Acute pain of right shoulder -     DG Shoulder Right  Injury of right shoulder, initial encounter -     DG Shoulder Right  Other orders -     Discontinue: HYDROcodone-acetaminophen (NORCO/VICODIN) 5-325 MG tablet; Take 1 tablet by mouth every 6 (six) hours as needed for up to 5 days for moderate pain.   We will get stat x-ray in office today for assessment.  Suspect some degree of AC injury.  Will consult Dr. Dianah Frey  for plan.  Xray showed some degenerative changes at [facility] joint but no separation. Likely injury inflamed all surrounding tissue. Placed in sling for 2 weeks. Mobic. Encouraged to ice area. HO given. Follow up with sports medicine in 2 weeks.  Robert Frey KitchenMarland KitchenPDMP reviewed during this encounter.  Small quanity of norco given for moderate to severe pain.

## 2019-06-03 NOTE — Progress Notes (Signed)
Discussed with patient in clinic to acute findings. Degenerative changes noted in [facility] joint.

## 2019-06-17 ENCOUNTER — Other Ambulatory Visit: Payer: Self-pay

## 2019-06-17 ENCOUNTER — Ambulatory Visit (INDEPENDENT_AMBULATORY_CARE_PROVIDER_SITE_OTHER): Payer: Commercial Managed Care - PPO | Admitting: Sports Medicine

## 2019-06-17 DIAGNOSIS — M25511 Pain in right shoulder: Secondary | ICD-10-CM | POA: Diagnosis not present

## 2019-06-17 DIAGNOSIS — S43004D Unspecified dislocation of right shoulder joint, subsequent encounter: Secondary | ICD-10-CM

## 2019-06-17 MED ORDER — HYDROCODONE-ACETAMINOPHEN 5-325 MG PO TABS: 1.0000 | ORAL_TABLET | Freq: Four times a day (QID) | ORAL | 0 refills | Status: AC | PRN

## 2019-06-17 NOTE — Patient Instructions (Signed)
Sling for 1 week then may discontinue

## 2019-06-17 NOTE — Assessment & Plan Note (Signed)
Robert Frey fell directly onto his right shoulder about 10 days ago, since then has had pain that he localizes at the top of the shoulder, he was seen by Iran Planas, PA-C, treated conservatively and referred to me for further evaluation and definitive treatment. He does have a positive crossarm sign. Symptoms and signs are highly consistent with a grade 1 shoulder separation, he does have some underlying acromioclavicular osteoarthritis which will likely cause residual pain once his AC separation is healed. Continue sling for another week, continue hydrocodone at night, I am going to refill this. Adding some rehab exercises. Like to see him back in about 2 weeks, we will do an acromioclavicular joint injection with ultrasound guidance if is not any better.

## 2019-06-17 NOTE — Progress Notes (Signed)
    Procedures performed today:    None.  Independent interpretation of tests performed by another provider:   I personally reviewed his x-rays, he has mild acromioclavicular and glenohumeral degenerative changes.  No obvious fractures  Impression and Recommendations:    Shoulder separation, right, subsequent encounter Robert Frey fell directly onto his right shoulder about 10 days ago, since then has had pain that he localizes at the top of the shoulder, he was seen by Iran Planas, PA-C, treated conservatively and referred to me for further evaluation and definitive treatment. He does have a positive crossarm sign. Symptoms and signs are highly consistent with a grade 1 shoulder separation, he does have some underlying acromioclavicular osteoarthritis which will likely cause residual pain once his AC separation is healed. Continue sling for another week, continue hydrocodone at night, I am going to refill this. Adding some rehab exercises. Like to see him back in about 2 weeks, we will do an acromioclavicular joint injection with ultrasound guidance if is not any better.    ___________________________________________ Gwen Her. Dianah Field, M.D., ABFM., CAQSM. Primary Care and [place] Instructor of [place] of [facility] of [facility]

## 2019-07-02 ENCOUNTER — Ambulatory Visit (INDEPENDENT_AMBULATORY_CARE_PROVIDER_SITE_OTHER): Payer: Commercial Managed Care - PPO | Admitting: Sports Medicine

## 2019-07-02 ENCOUNTER — Other Ambulatory Visit: Payer: Self-pay

## 2019-07-02 DIAGNOSIS — M19011 Primary osteoarthritis, right shoulder: Secondary | ICD-10-CM

## 2019-07-02 DIAGNOSIS — L4050 Arthropathic psoriasis, unspecified: Secondary | ICD-10-CM

## 2019-07-02 DIAGNOSIS — S43004D Unspecified dislocation of right shoulder joint, subsequent encounter: Secondary | ICD-10-CM | POA: Diagnosis not present

## 2019-07-02 MED ORDER — HYDROCODONE-ACETAMINOPHEN 5-325 MG PO TABS: 1.0000 | ORAL_TABLET | Freq: Three times a day (TID) | ORAL | 0 refills | Status: DC | PRN

## 2019-07-02 NOTE — Assessment & Plan Note (Signed)
Toprak returns, he is a pleasant 59 year old male, fell directly into the right shoulder approximately a month and change ago. Pain was directly over the acromioclavicular joint. X-rays showed mild osteoarthritis of the acromioclavicular joint, and symptoms were consistent with a grade 1 separation. He continues to have pain 1 month plus after his injury so we did a acromioclavicular injection today. I am refilling his hydrocodone. Return to see me in 1 month.

## 2019-07-02 NOTE — Assessment & Plan Note (Signed)
Robert Frey has been on Humira and Enbrel in the past along with methotrexate. He tells me he had issues with abnormal liver function on these medications. He stopped all medications and has adopted a holistic approach. I have advised him today that holistic approach is acceptable however in the case of psoriatic arthritis with potential permanent damage to his joints he should again consider working with a rheumatologist, and consider medication such as Cosentyx, and potentially avoiding methotrexate due to his historical abnormal liver function. I am going to do the referral.

## 2019-07-02 NOTE — Progress Notes (Signed)
    Procedures performed today:    Procedure: Real-time Ultrasound Guided injection of the right acromioclavicular joint Device: Samsung HS60  Verbal informed consent obtained.  Time-out conducted.  Noted no overlying erythema, induration, or other signs of local infection.  Skin prepped in a sterile fashion.  Local anesthesia: Topical Ethyl chloride.  With sterile technique and under real time ultrasound guidance:  1 cc Kenalog 40, 1 cc lidocaine injected easily pain immediately resolved suggesting accurate placement of the medication.  Advised to call if fevers/chills, erythema, induration, drainage, or persistent bleeding.  Images permanently stored and available for review in the ultrasound unit.  Impression: Technically successful ultrasound guided injection.  Procedure: Real-time Ultrasound Guided injection of the right glenohumeral joint Device: Samsung HS60  Verbal informed consent obtained.  Time-out conducted.  Noted no overlying erythema, induration, or other signs of local infection.  Skin prepped in a sterile fashion.  Local anesthesia: Topical Ethyl chloride.  With sterile technique and under real time ultrasound guidance: 1 cc Kenalog 40, 2 cc lidocaine, 2 cc bupivacaine injected easily Completed without difficulty  Pain immediately resolved suggesting accurate placement of the medication.  Advised to call if fevers/chills, erythema, induration, drainage, or persistent bleeding.  Images permanently stored and available for review in the ultrasound unit.  Impression: Technically successful ultrasound guided injection.  Independent interpretation of tests performed by another provider:   None.  Impression and Recommendations:    Shoulder separation, right, subsequent encounter Robert Frey returns, he is a pleasant 59 year old male, fell directly into the right shoulder approximately a month and change ago. Pain was directly over the acromioclavicular joint. X-rays showed  mild osteoarthritis of the acromioclavicular joint, and symptoms were consistent with a grade 1 separation. He continues to have pain 1 month plus after his injury so we did a acromioclavicular injection today. I am refilling his hydrocodone. Return to see me in 1 month.  Primary osteoarthritis, right glenohumeral joint Robert Frey also has pain at the joint line with x-rays confirming glenohumeral osteoarthritis. He has persistent pain so we also injected his glenohumeral joint today. Return to see me in a month.  Psoriatic arthritis ([place]) Robert Frey has been on Humira and Enbrel in the past along with methotrexate. He tells me he had issues with abnormal liver function on these medications. He stopped all medications and has adopted a holistic approach. I have advised him today that holistic approach is acceptable however in the case of psoriatic arthritis with potential permanent damage to his joints he should again consider working with a rheumatologist, and consider medication such as Cosentyx, and potentially avoiding methotrexate due to his historical abnormal liver function. I am going to do the referral.    ___________________________________________ Robert Frey. Robert Frey, M.D., ABFM., CAQSM. Primary Care and [place] Instructor of [place] of [facility] of Medicine

## 2019-07-02 NOTE — Assessment & Plan Note (Signed)
Robert Frey also has pain at the joint line with x-rays confirming glenohumeral osteoarthritis. He has persistent pain so we also injected his glenohumeral joint today. Return to see me in a month.

## 2019-07-16 ENCOUNTER — Encounter: Payer: Self-pay | Admitting: Physician Assistant

## 2019-07-16 ENCOUNTER — Telehealth: Payer: Self-pay | Admitting: Neurology

## 2019-07-16 ENCOUNTER — Other Ambulatory Visit: Payer: Self-pay

## 2019-07-16 ENCOUNTER — Ambulatory Visit (INDEPENDENT_AMBULATORY_CARE_PROVIDER_SITE_OTHER): Payer: Commercial Managed Care - PPO | Admitting: Physician Assistant

## 2019-07-16 VITALS — BP 140/76 | HR 101 | Ht 72.0 in | Wt 210.0 lb

## 2019-07-16 DIAGNOSIS — Z794 Long term (current) use of insulin: Secondary | ICD-10-CM | POA: Diagnosis not present

## 2019-07-16 DIAGNOSIS — E782 Mixed hyperlipidemia: Secondary | ICD-10-CM

## 2019-07-16 DIAGNOSIS — I10 Essential (primary) hypertension: Secondary | ICD-10-CM

## 2019-07-16 DIAGNOSIS — G250 Essential tremor: Secondary | ICD-10-CM

## 2019-07-16 DIAGNOSIS — Z1211 Encounter for screening for malignant neoplasm of colon: Secondary | ICD-10-CM | POA: Diagnosis not present

## 2019-07-16 DIAGNOSIS — R809 Proteinuria, unspecified: Secondary | ICD-10-CM | POA: Diagnosis not present

## 2019-07-16 DIAGNOSIS — E1129 Type 2 diabetes mellitus with other diabetic kidney complication: Secondary | ICD-10-CM | POA: Diagnosis not present

## 2019-07-16 LAB — LIPID PANEL W/REFLEX DIRECT LDL
Cholesterol: 185 mg/dL (ref <200)
HDL: 49 mg/dL (ref >=40)
LDL Cholesterol (Calc): 104 mg/dL (calc) — ABNORMAL HIGH
Non-HDL Cholesterol (Calc): 136 mg/dL (calc) — ABNORMAL HIGH (ref <130)
Total CHOL/HDL Ratio: 3.8 (calc) (ref <5.0)
Triglycerides: 207 mg/dL — ABNORMAL HIGH (ref <150)

## 2019-07-16 LAB — COMPLETE METABOLIC PANEL WITH GFR
AG Ratio: 1.8 (calc) (ref 1.0–2.5)
ALT: 62 U/L — ABNORMAL HIGH (ref 9–46)
AST: 63 U/L — ABNORMAL HIGH (ref 10–35)
Albumin: 4.8 g/dL (ref 3.6–5.1)
Alkaline phosphatase (APISO): 55 U/L (ref 35–144)
BUN: 17 mg/dL (ref 7–25)
CO2: 26 mmol/L (ref 20–32)
Calcium: 9.6 mg/dL (ref 8.6–10.3)
Chloride: 99 mmol/L (ref 98–110)
Creat: 0.95 mg/dL (ref 0.70–1.33)
GFR, Est African American: 103 mL/min/{1.73_m2} (ref >=60)
GFR, Est Non African American: 89 mL/min/{1.73_m2} (ref >=60)
Globulin: 2.7 g/dL (calc) (ref 1.9–3.7)
Glucose, Bld: 137 mg/dL — ABNORMAL HIGH (ref 65–99)
Potassium: 4.6 mmol/L (ref 3.5–5.3)
Sodium: 137 mmol/L (ref 135–146)
Total Bilirubin: 0.9 mg/dL (ref 0.2–1.2)
Total Protein: 7.5 g/dL (ref 6.1–8.1)

## 2019-07-16 LAB — POCT GLYCOSYLATED HEMOGLOBIN (HGB A1C): Hemoglobin A1C: 7.3 % — AB (ref 4.0–5.6)

## 2019-07-16 MED ORDER — LOSARTAN POTASSIUM 100 MG PO TABS: 100.0000 mg | ORAL_TABLET | Freq: Every day | ORAL | 0 refills | Status: DC

## 2019-07-16 MED ORDER — XIGDUO XR 10-1000 MG PO TB24: 1.0000 | ORAL_TABLET | Freq: Every day | ORAL | 2 refills | Status: DC

## 2019-07-16 MED ORDER — PRIMIDONE 50 MG PO TABS: ORAL_TABLET | ORAL | 2 refills | Status: DC

## 2019-07-16 MED ORDER — OZEMPIC (1 MG/DOSE) 2 MG/1.5ML ~~LOC~~ SOPN: 1.0000 mg | PEN_INJECTOR | SUBCUTANEOUS | 2 refills | Status: DC

## 2019-07-16 NOTE — Telephone Encounter (Signed)
Cologuard order faxed to [phone] with confirmation received. They will contact the patient directly.   

## 2019-07-16 NOTE — Patient Instructions (Signed)
toujeo increase by 2 units now and then every 5 reassess and if above 130 then increase by 2 units again and then repeat.   Stay on all other medications.

## 2019-07-16 NOTE — Progress Notes (Signed)
Subjective:    Patient ID: Robert Frey, male    DOB: Sep 17, 1960, 59 y.o.   MRN: [id number]  HPI  Pt is a 59 yo male with T2DM, HTN, HLD, essential tremor who presents to the clinic for 3 month follow up.   He is taking all medication. He denies any CP, palpitations, headaches, vision changes. He is taking cozaar and norvasc for BP checking BP at home and running 130s over 70s.   He is taking 20 units of toujeo at bedtime, xigduo, ozempic weekly. No problems or concerns. Checking fastings sugars and running 130s to 150s. No open sore or wounds. He is trying to watch sugars and carbs. He is not exercising.   Tremor has improved but still struggles to give insulin shot and with fine motor task. On 1/2 tablet daily of primadone.    .. Active Ambulatory Problems    Diagnosis Date Noted  . HYPERCHOLESTEROLEMIA 03/06/2006  . OBESITY, NOS 03/06/2006  . OBSESSIVE COMPUL. DISORDER 03/06/2006  . KNEE PAIN, BILATERAL 12/28/2006  . ANKYLOSING SPONDYLITIS 09/30/2009  . Psoriatic arthritis ([place]) 10/11/2012  . Type 2 diabetes mellitus with microalbuminuria, with long-term current use of insulin ([place]) 10/11/2012  . Heel spur 01/20/2014  . Osteoarthritis of foot, left 01/20/2014  . Lung nodule < 6cm on CT 05/15/2014  . Essential hypertension 06/08/2015  . Microalbuminuria 06/08/2015  . Bruising 06/09/2015  . Lumbosacral strain 01/17/2017  . Dark urine 01/01/2019  . Shakes 04/04/2019  . Essential tremor 04/16/2019  . Shoulder separation, right, subsequent encounter 06/17/2019  . Primary osteoarthritis, right glenohumeral joint 07/02/2019   Resolved Ambulatory Problems    Diagnosis Date Noted  . ELEVATED BLOOD PRESSURE WITHOUT DIAGNOSIS OF HYPERTENSION 08/29/2006  . Cough 11/25/2010   Past Medical History:  Diagnosis Date  . COPD (chronic obstructive pulmonary disease) ([place])   . Diabetes mellitus without complication ([place])      Review of Systems  All other systems reviewed and are  negative.      Objective:   Physical Exam Vitals reviewed.  Constitutional:      Appearance: Normal appearance. He is obese.  HENT:     Head: Normocephalic.  Cardiovascular:     Rate and Rhythm: Regular rhythm. Tachycardia present.     Pulses: Normal pulses.  Pulmonary:     Effort: Pulmonary effort is normal.     Breath sounds: Normal breath sounds.  Neurological:     General: No focal deficit present.     Mental Status: He is alert and oriented to person, place, and time.  Psychiatric:        Mood and Affect: Mood normal.        Behavior: Behavior normal.           Assessment & Plan:  Marland KitchenMarland KitchenTery was seen today for diabetes and hypertension.  Diagnoses and all orders for this visit:  Type 2 diabetes mellitus with microalbuminuria, with long-term current use of insulin ([name]) -     POCT glycosylated hemoglobin (Hb A1C) -     COMPLETE METABOLIC PANEL WITH GFR -     Semaglutide, 1 MG/DOSE, (OZEMPIC, 1 MG/DOSE,) 2 MG/1.5ML SOPN; Inject 1 mg into the skin once a week. -     Dapagliflozin-metFORMIN HCl ER (XIGDUO XR) 02-999 MG TB24; Take 1 tablet by mouth daily.  Essential tremor -     primidone (MYSOLINE) 50 MG tablet; Take one tablet daily.  Essential hypertension -     COMPLETE METABOLIC PANEL WITH  GFR -     losartan (COZAAR) 100 MG tablet; Take 1 tablet (100 mg total) by mouth daily.  Colon cancer screening -     Cologuard  Mixed hyperlipidemia -     Lipid Panel w/reflex Direct LDL   .Marland Kitchen Lab Results  Component Value Date   HGBA1C 7.3 (A) 07/16/2019   No change in a1c. Not to goal.  Increase toujeo at bedtime by 2 units every 5 days until fasting glucose below 130.  Continue xigduo and ozempic.  DM diet and try to walk 15 minutes a day.  On STATIN. Recheck lipid. On ARB. BP goal is under 140/90. BPs at home better than in office. Stay on same medications.  Eye exam UTD.  Flu and pneumonia vaccine UTD.   Needs colonoscopy. Agreed to cologuard.    Increased primadone for tremor.   Follow up in 3 months.

## 2019-07-17 ENCOUNTER — Other Ambulatory Visit: Payer: Self-pay | Admitting: Physician Assistant

## 2019-07-17 MED ORDER — ATORVASTATIN CALCIUM 80 MG PO TABS: 80.0000 mg | ORAL_TABLET | Freq: Every day | ORAL | 3 refills | Status: DC

## 2019-07-17 MED ORDER — ICOSAPENT ETHYL 1 G PO CAPS: 2.0000 g | ORAL_CAPSULE | Freq: Two times a day (BID) | ORAL | 11 refills | Status: DC

## 2019-07-17 NOTE — Progress Notes (Signed)
Schyler,   Cholesterol is much better but still not to goal. I will increase lipitor to 80mg . Liver enyzmes improving.  TG still elevated. I would like to add prescription fish oil to lipitor to get TG down and reduce CV risk I will send vascepa topharmacy.

## 2019-07-18 ENCOUNTER — Other Ambulatory Visit: Payer: Self-pay | Admitting: Physician Assistant

## 2019-07-18 DIAGNOSIS — G250 Essential tremor: Secondary | ICD-10-CM

## 2019-07-21 ENCOUNTER — Telehealth: Payer: Self-pay | Admitting: Physician Assistant

## 2019-07-21 NOTE — Telephone Encounter (Signed)
Received fax for PA on Vascepa sent through cover my meds and received authorization valid through 07/20/2020 - CF

## 2019-07-26 ENCOUNTER — Other Ambulatory Visit: Payer: Self-pay | Admitting: Physician Assistant

## 2019-07-26 DIAGNOSIS — E1129 Type 2 diabetes mellitus with other diabetic kidney complication: Secondary | ICD-10-CM

## 2019-07-30 ENCOUNTER — Other Ambulatory Visit: Payer: Self-pay

## 2019-07-30 ENCOUNTER — Ambulatory Visit (INDEPENDENT_AMBULATORY_CARE_PROVIDER_SITE_OTHER): Payer: Commercial Managed Care - PPO | Admitting: Sports Medicine

## 2019-07-30 DIAGNOSIS — L4050 Arthropathic psoriasis, unspecified: Secondary | ICD-10-CM

## 2019-07-30 DIAGNOSIS — M19011 Primary osteoarthritis, right shoulder: Secondary | ICD-10-CM

## 2019-07-30 NOTE — Assessment & Plan Note (Signed)
Robert Frey returns, he has glenohumeral and acromioclavicular osteoarthritis, I injected both structures at the last visit he returns today doing significantly better. He is happy with how things are going and continues to improve. He agrees to increase his diligent with his rehabilitation exercises, and desires to also keep somewhat of a more holistic supplement approach rather than use allopathic medications which I respect. He can call me as needed if no better with being diligent with his rehab exercises for the next month or 2.

## 2019-07-30 NOTE — Progress Notes (Signed)
    Procedures performed today:    None.  Independent interpretation of notes and tests performed by another provider:   None.  Impression and Recommendations:    Primary osteoarthritis, right glenohumeral joint Kester returns, he has glenohumeral and acromioclavicular osteoarthritis, I injected both structures at the last visit he returns today doing significantly better. He is happy with how things are going and continues to improve. He agrees to increase his diligent with his rehabilitation exercises, and desires to also keep somewhat of a more holistic supplement approach rather than use allopathic medications which I respect. He can call me as needed if no better with being diligent with his rehab exercises for the next month or 2.  Psoriatic arthritis ([place]) As above Nichlaus desires a holistic approach with treatment of his psoriatic arthritis, he did have to come off of his Biologics and methotrexate due to liver insufficiency. He has a follow-up appointment with rheumatology coming up in May. I think if he is able to get back on a biologic his shoulder will improve considerably as well.     ___________________________________________ Gwen Her. Dianah Field, M.D., ABFM., CAQSM. Primary Care and [place] Instructor of [place] [facility]

## 2019-07-30 NOTE — Assessment & Plan Note (Signed)
As above Robert Frey desires a holistic approach with treatment of his psoriatic arthritis, he did have to come off of his Biologics and methotrexate due to liver insufficiency. He has a follow-up appointment with rheumatology coming up in May. I think if he is able to get back on a biologic his shoulder will improve considerably as well.

## 2019-08-11 ENCOUNTER — Other Ambulatory Visit: Payer: Self-pay | Admitting: Physician Assistant

## 2019-08-11 DIAGNOSIS — E1129 Type 2 diabetes mellitus with other diabetic kidney complication: Secondary | ICD-10-CM

## 2019-08-11 DIAGNOSIS — Z794 Long term (current) use of insulin: Secondary | ICD-10-CM

## 2019-09-24 ENCOUNTER — Other Ambulatory Visit: Payer: Self-pay | Admitting: Physician Assistant

## 2019-09-24 DIAGNOSIS — E1129 Type 2 diabetes mellitus with other diabetic kidney complication: Secondary | ICD-10-CM

## 2019-09-24 DIAGNOSIS — R809 Proteinuria, unspecified: Secondary | ICD-10-CM

## 2019-09-24 DIAGNOSIS — I10 Essential (primary) hypertension: Secondary | ICD-10-CM

## 2019-09-25 NOTE — Progress Notes (Signed)
Office Visit Note  Patient: Robert Frey             Date of Birth: 01/30/1961           MRN: [id number]             PCP: Lavada Mesi Referring: Silverio Decamp,* Visit Date: 09/30/2019 Occupation: '@GUAROCC' @  Subjective:  New Patient (Initial Visit) (Psoriasis, Arthritis)   History of Present Illness: Robert Frey is a 59 y.o. male with history of psoriatic arthritis, osteoarthritis and psoriasis he was seen last by me in May 2017.  Patient states that he started having psoriasis when he was in his 68s and was treated with topical agents by dermatologist a few late years later he started having lower back pain and joint pain.  He came to see me in March 2010 at the time the work-up was done and he was diagnosed with psoriatic arthritis.  He was placed on methotrexate initially as methotrexate failed Enbrel was added.  He did very good on the combination therapy.  He stayed on the combination therapy until 2017 and then decided to come off Enbrel.  He saw me last in May 2017 when he discontinued Enbrel and was a still taking methotrexate.  After that he lost follow-up.  He states he discontinued methotrexate as well and tried holistic medications for a while.  He states he did really well until the end of 2021 psoriasis flared all over.  He was seen by his PCP and was given topical agents which helped with the psoriasis.  Although his joints continue to hurt.  He has been under care of Dr. Dianah Field.  He was found to have severe right shoulder joint glenohumeral arthritis.  He was referred to me for further evaluation.  Patient gives history of lower back pain he has known history of degenerative disease of lumbar spine.  He also gives history of pain in bilateral hands, hip joints, SI joints, left knee, ankles.  He denies any joint swelling currently.  There is no history of plantar fasciitis or Achilles tendinitis.  Activities of Daily Living:  Patient reports morning  stiffness for 45 minutes.   Patient Denies nocturnal pain.  Difficulty dressing/grooming: Reports Difficulty climbing stairs: Denies Difficulty getting out of chair: Denies Difficulty using hands for taps, buttons, cutlery, and/or writing: Denies  Review of Systems  Constitutional: Positive for fatigue. Negative for night sweats.  HENT: Positive for mouth dryness. Negative for mouth sores and nose dryness.   Eyes: Negative for redness and dryness.  Respiratory: Negative for shortness of breath and difficulty breathing.   Cardiovascular: Negative for chest pain, palpitations, hypertension, irregular heartbeat and swelling in legs/feet.  Gastrointestinal: Negative for constipation and diarrhea.  Endocrine: Negative for excessive thirst and increased urination.  Genitourinary: Negative for difficulty urinating.  Musculoskeletal: Positive for arthralgias, joint pain and morning stiffness. Negative for joint swelling, myalgias, muscle weakness, muscle tenderness and myalgias.  Skin: Positive for rash. Negative for color change, hair loss, nodules/bumps, skin tightness, ulcers and sensitivity to sunlight.  Allergic/Immunologic: Negative for susceptible to infections.  Neurological: Negative for dizziness, fainting, numbness, memory loss, night sweats and weakness ( ).  Hematological: Negative for bruising/bleeding tendency and swollen glands.  Psychiatric/Behavioral: Negative for depressed mood and sleep disturbance. The patient is not nervous/anxious.     PMFS History:  Patient Active Problem List   Diagnosis Date Noted  . Primary osteoarthritis, right glenohumeral joint 07/02/2019  . Shoulder separation,  right, subsequent encounter 06/17/2019  . Essential tremor 04/16/2019  . Shakes 04/04/2019  . Dark urine 01/01/2019  . Lumbosacral strain 01/17/2017  . Bruising 06/09/2015  . Essential hypertension 06/08/2015  . Microalbuminuria 06/08/2015  . Lung nodule < 6cm on CT 05/15/2014  .  Heel spur 01/20/2014  . Osteoarthritis of foot, left 01/20/2014  . Psoriatic arthritis ([place]) 10/11/2012  . Type 2 diabetes mellitus with microalbuminuria, with long-term current use of insulin ([place]) 10/11/2012  . ANKYLOSING SPONDYLITIS 09/30/2009  . KNEE PAIN, BILATERAL 12/28/2006  . HYPERCHOLESTEROLEMIA 03/06/2006  . OBESITY, NOS 03/06/2006  . OBSESSIVE COMPUL. DISORDER 03/06/2006    Past Medical History:  Diagnosis Date  . Ankylosing spondylitis ([place])   . COPD (chronic obstructive pulmonary disease) ([place])   . Diabetes mellitus without complication ([place])   . High cholesterol   . Hypertension     Family History  Problem Relation Age of Onset  . Stroke Other   . Alcohol abuse Other   . Depression Other   . Hypertension Other   . Hyperlipidemia Other   . Diabetes Mother   . Alzheimer's disease Mother   . Diabetes Sister   . Stroke Sister    Past Surgical History:  Procedure Laterality Date  . APPENDECTOMY    . CARPAL TUNNEL RELEASE Right   . ent surgery    . TONSILLECTOMY AND ADENOIDECTOMY     Social History   Social History Narrative  . Not on file   Immunization History  Administered Date(s) Administered  . Influenza Whole 06/14/2006  . Influenza,inj,Quad PF,6+ Mos 01/17/2017  . Pneumococcal Polysaccharide-23 12/16/2008, 10/08/2017  . Tdap 12/28/2013     Objective: Vital Signs: BP (!) 117/55 (BP Location: Right Arm, Patient Position: Sitting, Cuff Size: Normal)   Pulse (!) 106   Resp 18   Ht 5' 10.5" (1.791 m)   Wt 218 lb (98.9 kg)   BMI 30.84 kg/m    Physical Exam Vitals and nursing note reviewed.  Constitutional:      Appearance: He is well-developed.  HENT:     Head: Normocephalic and atraumatic.  Eyes:     Conjunctiva/sclera: Conjunctivae normal.     Pupils: Pupils are equal, round, and reactive to light.  Cardiovascular:     Rate and Rhythm: Normal rate and regular rhythm.     Heart sounds: Normal heart sounds.  Pulmonary:     Effort:  Pulmonary effort is normal.     Breath sounds: Normal breath sounds.  Abdominal:     General: Bowel sounds are normal.     Palpations: Abdomen is soft.  Musculoskeletal:     Cervical back: Normal range of motion and neck supple.  Skin:    General: Skin is warm and dry.     Capillary Refill: Capillary refill takes less than 2 seconds.  Neurological:     Mental Status: He is alert and oriented to person, place, and time.  Psychiatric:        Behavior: Behavior normal.      Musculoskeletal Exam: C-spine was in good range of motion.  He has limited painful range of motion of lumbar spine.  He had mild tenderness over SI joints.  Shoulder joints, elbow joints were in good range of motion.  He has some DIP and PIP thickening with no synovitis.  He has some tenderness across PIPs.  Hip joints and knee joints with good range of motion.  He has some discomfort in his left knee joint without any  warmth swelling or effusion.  He has good range of motion of ankle joints.  He has some DIP and PIP thickening in his feet but no synovitis was noted.  CDAI Exam: CDAI Score: -- Patient Global: --; Provider Global: -- Swollen: --; Tender: -- Joint Exam 09/30/2019   No joint exam has been documented for this visit   There is currently no information documented on the homunculus. Go to the Rheumatology activity and complete the homunculus joint exam.  Investigation: No additional findings.  Imaging: XR Foot 2 Views Left  Result Date: 09/30/2019 First MTP, PIP and DIP narrowing was noted.  Dorsal spurring was noted.  Inferior and posterior calcaneal spurs were noted.  No erosive changes were noted. Impression: These findings are consistent with osteoarthritis of the foot.  XR Foot 2 Views Right  Result Date: 09/30/2019 First MTP, PIP and DIP narrowing was noted.  No erosive changes were noted.  No intertarsal tibiotalar joint space narrowing was noted.  Dorsal spurring, inferior and posterior  calcaneal spurs were noted. Impression: These findings are consistent with osteoarthritis of the foot.  XR Hand 2 View Left  Result Date: 09/30/2019 Minimal PIP and DIP narrowing was noted.  Posttraumatic changes noted over the fifth PIP joint.  No MCP, intercarpal or radiocarpal joint space narrowing was noted.  No erosive changes were noted. Impression: These findings are consistent with osteoarthritis of the hand.  XR Hand 2 View Right  Result Date: 09/30/2019 Minimal PIP and DIP narrowing was noted.  No MCP, intercarpal or radiocarpal joint space narrowing was noted.  No erosive changes were noted. Impression: These findings are consistent with osteoarthritis of the hand.  XR Pelvis 1-2 Views  Result Date: 09/30/2019 Bilateral SI joint sclerosis was noted.  No significant narrowing was noted. Impression: These findings are consistent with chronic sacroiliitis.   Recent Labs: Lab Results  Component Value Date   WBC 6.5 01/01/2019   HGB 15.9 01/01/2019   PLT 143 01/01/2019   NA 137 07/16/2019   K 4.6 07/16/2019   CL 99 07/16/2019   CO2 26 07/16/2019   GLUCOSE 137 (H) 07/16/2019   BUN 17 07/16/2019   CREATININE 0.95 07/16/2019   BILITOT 0.9 07/16/2019   ALKPHOS 108 11/06/2016   AST 63 (H) 07/16/2019   ALT 62 (H) 07/16/2019   PROT 7.5 07/16/2019   ALBUMIN 3.9 11/06/2016   CALCIUM 9.6 07/16/2019   GFRAA 103 07/16/2019  August 24, 2008 ESR is 18, hep panel negative, RF negative, ANA negative, ACE 100, anti-CCP negative, HLA-B27 Negative   speciality Comments: No specialty comments available.  Procedures:  No procedures performed Allergies: Azithromycin, Green dyes, Propranolol, and Zithromax [azithromycin dihydrate]   Assessment / Plan:     Visit Diagnoses: Psoriatic arthritis ([place]) - History of inflammatory arthritis, sacroiliitis on MRI.  Patient has no synovitis on examination today.  He does have arthralgias and the x-rays were consistent with osteoarthritis.  The x-rays  of the SI joints showed SI joint to sclerosis consistent with sacroiliitis.  He continues to have some morning stiffness and SI joint pain.  He came off Enbrel himself when his symptoms went into remission.  Methotrexate was discontinued due to elevated LFTs in the past.  He was in remission for several years but has been experiencing increased psoriasis and increased joint pain now.  We detailed discussion regarding possible use of Otezla.  Indications side effects contraindications were discussed at length.  Patient was in agreement to proceed with [facility].  We will apply for [facility].  A handout was given and consent was taken.  Counseled patient that [place] Nail is a PDE 4 inhibitor that works to treat psoriasis and the joint pain and tenderness of psoriatic arthritis.  Counseled patient on purpose, proper use, and adverse effects of Otezla.  Reviewed the most common adverse effects of weight loss, depression, nausea/diarrhea/vomiting, headaches, and nasal congestion.  Advised patient to notify office of any serious changes in mood and/or thoughts of suicide.  Provided patient with medication education material and answered all questions.  Patient consented to [place].    Patient dose will be Otezla starter titration pack and then 30 mg twice daily.  Prescription pending insurance approval and once approved patient may pick up sample for starter pack from our office.  Psoriasis-he has psoriasis involving his lower extremities and abdominal region.  He has been using clobetasol cream which is cleared up his abdominal rash.  Elevated LFTs-he has chronic elevation of LFTs.  He states he has had work-up in the past.  Pain in both hands -he complains of pain in both hands.  No synovitis was noted.  Clinical and radiographic findings are consistent with osteoarthritis.  Plan: XR Hand 2 View Right, XR Hand 2 View Left  Pain in both feet -he complains of pain in bilateral feet.  No synovitis was noted.  There was no  evidence of Achilles tendinitis or plantar fasciitis.  Clinical and radiographic findings are consistent with osteoarthritis of feet.  Plan: XR Foot 2 Views Right, XR Foot 2 Views Left  Chronic SI joint pain -I reviewed MRI of SI joints in 2010 which was consistent with sacroiliitis.  The x-rays today showed SI joint to sclerosis.  Plan: XR Pelvis 1-2 Views  DDD (degenerative disc disease), lumbar-he has known history of degenerative disease of lumbar spine which causes chronic pain.  Shoulder separation, right, subsequent encounter-followed by Dr. Dianah Field.  Primary osteoarthritis, right glenohumeral joint  Essential hypertension-his blood pressure is controlled.  Dyslipidemia-he is on lipid-lowering agents.  Type 2 diabetes mellitus with microalbuminuria, with long-term current use of insulin ([name])-treated.  Essential tremor  Lung nodule < 6cm on CT - Scarring from pneumonia per patient  Smoker - 1 pack/day for 25 years.  Smoking cessation was discussed.  Microalbuminuria  Orders: Orders Placed This Encounter  Procedures  . XR Pelvis 1-2 Views  . XR Hand 2 View Right  . XR Hand 2 View Left  . XR Foot 2 Views Right  . XR Foot 2 Views Left   No orders of the defined types were placed in this encounter.   Face-to-face time spent with patient was 60 minutes. Greater than 50% of time was spent in counseling and coordination of care.  Follow-Up Instructions: Return for Psoriatic arthritis,Ps.   Bo Merino, MD  Note - This record has been created using Editor, commissioning.  Chart creation errors have been sought, but may not always  have been located. Such creation errors do not reflect on  the standard of medical care.

## 2019-09-30 ENCOUNTER — Ambulatory Visit: Payer: Self-pay

## 2019-09-30 ENCOUNTER — Encounter: Payer: Self-pay | Admitting: Rheumatology

## 2019-09-30 ENCOUNTER — Telehealth: Payer: Self-pay | Admitting: Pharmacist

## 2019-09-30 ENCOUNTER — Other Ambulatory Visit: Payer: Self-pay

## 2019-09-30 ENCOUNTER — Ambulatory Visit (INDEPENDENT_AMBULATORY_CARE_PROVIDER_SITE_OTHER): Payer: Commercial Managed Care - PPO | Admitting: Rheumatology

## 2019-09-30 VITALS — BP 117/55 | HR 106 | Resp 18 | Ht 70.5 in | Wt 218.0 lb

## 2019-09-30 DIAGNOSIS — E785 Hyperlipidemia, unspecified: Secondary | ICD-10-CM

## 2019-09-30 DIAGNOSIS — I10 Essential (primary) hypertension: Secondary | ICD-10-CM

## 2019-09-30 DIAGNOSIS — M79642 Pain in left hand: Secondary | ICD-10-CM | POA: Diagnosis not present

## 2019-09-30 DIAGNOSIS — L4050 Arthropathic psoriasis, unspecified: Secondary | ICD-10-CM

## 2019-09-30 DIAGNOSIS — L409 Psoriasis, unspecified: Secondary | ICD-10-CM

## 2019-09-30 DIAGNOSIS — Z794 Long term (current) use of insulin: Secondary | ICD-10-CM

## 2019-09-30 DIAGNOSIS — R7989 Other specified abnormal findings of blood chemistry: Secondary | ICD-10-CM

## 2019-09-30 DIAGNOSIS — R911 Solitary pulmonary nodule: Secondary | ICD-10-CM

## 2019-09-30 DIAGNOSIS — G8929 Other chronic pain: Secondary | ICD-10-CM

## 2019-09-30 DIAGNOSIS — M19011 Primary osteoarthritis, right shoulder: Secondary | ICD-10-CM

## 2019-09-30 DIAGNOSIS — M79641 Pain in right hand: Secondary | ICD-10-CM

## 2019-09-30 DIAGNOSIS — M533 Sacrococcygeal disorders, not elsewhere classified: Secondary | ICD-10-CM | POA: Diagnosis not present

## 2019-09-30 DIAGNOSIS — G250 Essential tremor: Secondary | ICD-10-CM

## 2019-09-30 DIAGNOSIS — M79672 Pain in left foot: Secondary | ICD-10-CM

## 2019-09-30 DIAGNOSIS — M79671 Pain in right foot: Secondary | ICD-10-CM

## 2019-09-30 DIAGNOSIS — M19072 Primary osteoarthritis, left ankle and foot: Secondary | ICD-10-CM

## 2019-09-30 DIAGNOSIS — F17200 Nicotine dependence, unspecified, uncomplicated: Secondary | ICD-10-CM

## 2019-09-30 DIAGNOSIS — R809 Proteinuria, unspecified: Secondary | ICD-10-CM

## 2019-09-30 DIAGNOSIS — M5136 Other intervertebral disc degeneration, lumbar region: Secondary | ICD-10-CM

## 2019-09-30 DIAGNOSIS — E1129 Type 2 diabetes mellitus with other diabetic kidney complication: Secondary | ICD-10-CM

## 2019-09-30 DIAGNOSIS — S43004D Unspecified dislocation of right shoulder joint, subsequent encounter: Secondary | ICD-10-CM

## 2019-09-30 DIAGNOSIS — IMO0001 Reserved for inherently not codable concepts without codable children: Secondary | ICD-10-CM

## 2019-09-30 NOTE — Telephone Encounter (Signed)
Received notification from [facility] regarding a prior authorization for [place]. Authorization has been APPROVED from 09/30/19 to 09/29/20.   Authorization # [id number] Phone # [phone]  Per plan patient must fill through [place].

## 2019-09-30 NOTE — Telephone Encounter (Signed)
Submitted a Prior Authorization request to [facility] for [place] via Cover My Meds. Will update once we receive a response.  ([id number]) [id number]

## 2019-09-30 NOTE — Patient Instructions (Signed)
Apremilast oral tablets What is this medicine? APREMILAST (a PRE mil ast) is used to treat plaque psoriasis, psoriatic arthritis, and certain oral ulcers. This medicine may be used for other purposes; ask your health care provider or pharmacist if you have questions. COMMON BRAND NAME(S): Rutherford Nail What should I tell my health care provider before I take this medicine? They need to know if you have any of these conditions:  dehydration  kidney disease  mental illness  an unusual or allergic reaction to apremilast, other medicines, foods, dyes, or preservatives  pregnant or trying to get pregnant  breast-feeding How should I use this medicine? Take this medicine by mouth with a glass of water. Follow the directions on the prescription label. Do not cut, crush or chew this medicine. You can take it with or without food. If it upsets your stomach, take it with food. Take your medicine at regular intervals. Do not take it more often than directed. Do not stop taking except on your doctor's advice. Talk to your pediatrician regarding the use of this medicine in children. Special care may be needed. Overdosage: If you think you have taken too much of this medicine contact a poison control center or emergency room at once. NOTE: This medicine is only for you. Do not share this medicine with others. What if I miss a dose? If you miss a dose, take it as soon as you can. If it is almost time for your next dose, take only that dose. Do not take double or extra doses. What may interact with this medicine? This medicine may interact with the following medications:  certain medicines for seizures like carbamazepine, phenobarbital, phenytoin  rifampin This list may not describe all possible interactions. Give your health care provider a list of all the medicines, herbs, non-prescription drugs, or dietary supplements you use. Also tell them if you smoke, drink alcohol, or use illegal drugs. Some items  may interact with your medicine. What should I watch for while using this medicine? Tell your doctor or healthcare professional if your symptoms do not start to get better or if they get worse. Patients and their families should watch out for new or worsening depression or thoughts of suicide. Also watch out for sudden changes in feelings such as feeling anxious, agitated, panicky, irritable, hostile, aggressive, impulsive, severely restless, overly excited and hyperactive, or not being able to sleep. If this happens, call your health care professional. Check with your doctor or health care professional if you get an attack of severe diarrhea, nausea and vomiting, or if you sweat a lot. The loss of too much body fluid can make it dangerous for you to take this medicine. What side effects may I notice from receiving this medicine? Side effects that you should report to your doctor or health care professional as soon as possible:  depressed mood  weight loss Side effects that usually do not require medical attention (report to your doctor or health care professional if they continue or are bothersome):  diarrhea  headache  nausea, vomiting This list may not describe all possible side effects. Call your doctor for medical advice about side effects. You may report side effects to FDA at 1-800-FDA-1088. Where should I keep my medicine? Keep out of the reach of children. Store below 30 degrees C (86 degrees F). Throw away any unused medicine after the expiration date. NOTE: This sheet is a summary. It may not cover all possible information. If you have questions about this  medicine, talk to your doctor, pharmacist, or health care provider.  2020 Elsevier/Gold Standard (2017-12-17 12:47:14)  

## 2019-09-30 NOTE — Telephone Encounter (Signed)
Please start benefits investigation for [facility] for the treatment of psoriasis/psoriatic arthritis.  Patient has tried and failed methotrexate and Enbrel.  Mariella Saa, PharmD, [place], [place] Clinical Specialty Pharmacist [phone]  09/30/2019 9:38 AM

## 2019-10-01 ENCOUNTER — Other Ambulatory Visit: Payer: Self-pay | Admitting: Physician Assistant

## 2019-10-01 DIAGNOSIS — E1129 Type 2 diabetes mellitus with other diabetic kidney complication: Secondary | ICD-10-CM

## 2019-10-02 NOTE — Telephone Encounter (Signed)
Called to notify patient of approval but no answer.  Left voicemail stating Rutherford Nail was approved through insurance and we are unable to see his co-pay as he must fill through [place].  Offered patient the option of coming to the office to pick up a sample starter pack which has co-pay card or we can send a prescription for the starter pack to [place] [facility] specialty pharmacy and he can obtain co-pay card online.  Requested return call.  Mariella Saa, PharmD, [place], [place] Clinical Specialty Pharmacist [phone]  10/02/2019 10:15 AM

## 2019-10-08 MED ORDER — APREMILAST 10 & 20 & 30 MG PO TBPK: ORAL_TABLET | ORAL | 0 refills | Status: DC

## 2019-10-08 MED ORDER — APREMILAST 30 MG PO TABS: 30.0000 mg | ORAL_TABLET | Freq: Two times a day (BID) | ORAL | 0 refills | Status: DC

## 2019-10-08 NOTE — Telephone Encounter (Signed)
Called to follow-up with patient in regards to [facility] prescription and no answer.  Left voicemail advising since I have not heard from patient I have sent prescription for starter pack and maintenance dose to the specialty pharmacy.  Advised they should call within 24 to 48 hours to set up for shipment and he can obtain a co-pay online.  Instructed patient to call with any questions or concerns or difficulties obtaining his medication.   Mariella Saa, PharmD, [place], [place] Clinical Specialty Pharmacist [phone]  10/08/2019 10:01 AM

## 2019-10-12 NOTE — Progress Notes (Deleted)
Office Visit Note  Patient: Robert Frey             Date of Birth: 21-Jun-1960           MRN: [id number]             PCP: Lavada Mesi Referring: Lavada Mesi Visit Date: 10/22/2019 Occupation: @GUAROCC @  Subjective:  No chief complaint on file.   History of Present Illness: Robert Frey is a 59 y.o. male ***   Activities of Daily Living:  Patient reports morning stiffness for *** {minute/hour:19697}.   Patient {ACTIONS;DENIES/REPORTS:21021675::"Denies"} nocturnal pain.  Difficulty dressing/grooming: {ACTIONS;DENIES/REPORTS:21021675::"Denies"} Difficulty climbing stairs: {ACTIONS;DENIES/REPORTS:21021675::"Denies"} Difficulty getting out of chair: {ACTIONS;DENIES/REPORTS:21021675::"Denies"} Difficulty using hands for taps, buttons, cutlery, and/or writing: {ACTIONS;DENIES/REPORTS:21021675::"Denies"}  No Rheumatology ROS completed.   PMFS History:  Patient Active Problem List   Diagnosis Date Noted  . Primary osteoarthritis, right glenohumeral joint 07/02/2019  . Shoulder separation, right, subsequent encounter 06/17/2019  . Essential tremor 04/16/2019  . Shakes 04/04/2019  . Dark urine 01/01/2019  . Lumbosacral strain 01/17/2017  . Bruising 06/09/2015  . Essential hypertension 06/08/2015  . Microalbuminuria 06/08/2015  . Lung nodule < 6cm on CT 05/15/2014  . Heel spur 01/20/2014  . Osteoarthritis of foot, left 01/20/2014  . Psoriatic arthritis ([place]) 10/11/2012  . Type 2 diabetes mellitus with microalbuminuria, with long-term current use of insulin ([place]) 10/11/2012  . ANKYLOSING SPONDYLITIS 09/30/2009  . KNEE PAIN, BILATERAL 12/28/2006  . HYPERCHOLESTEROLEMIA 03/06/2006  . OBESITY, NOS 03/06/2006  . OBSESSIVE COMPUL. DISORDER 03/06/2006    Past Medical History:  Diagnosis Date  . Ankylosing spondylitis ([place])   . COPD (chronic obstructive pulmonary disease) ([place])   . Diabetes mellitus without complication ([place])   . High cholesterol   .  Hypertension     Family History  Problem Relation Age of Onset  . Stroke Other   . Alcohol abuse Other   . Depression Other   . Hypertension Other   . Hyperlipidemia Other   . Diabetes Mother   . Alzheimer's disease Mother   . Diabetes Sister   . Stroke Sister    Past Surgical History:  Procedure Laterality Date  . APPENDECTOMY    . CARPAL TUNNEL RELEASE Right   . ent surgery    . TONSILLECTOMY AND ADENOIDECTOMY     Social History   Social History Narrative  . Not on file   Immunization History  Administered Date(s) Administered  . Influenza Whole 06/14/2006  . Influenza,inj,Quad PF,6+ Mos 01/17/2017  . Pneumococcal Polysaccharide-23 12/16/2008, 10/08/2017  . Tdap 12/28/2013     Objective: Vital Signs: There were no vitals taken for this visit.   Physical Exam   Musculoskeletal Exam: ***  CDAI Exam: CDAI Score: -- Patient Global: --; Provider Global: -- Swollen: --; Tender: -- Joint Exam 10/22/2019   No joint exam has been documented for this visit   There is currently no information documented on the homunculus. Go to the Rheumatology activity and complete the homunculus joint exam.  Investigation: No additional findings.  Imaging: XR Foot 2 Views Left  Result Date: 09/30/2019 First MTP, PIP and DIP narrowing was noted.  Dorsal spurring was noted.  Inferior and posterior calcaneal spurs were noted.  No erosive changes were noted. Impression: These findings are consistent with osteoarthritis of the foot.  XR Foot 2 Views Right  Result Date: 09/30/2019 First MTP, PIP and DIP narrowing was noted.  No erosive changes were noted.  No intertarsal  tibiotalar joint space narrowing was noted.  Dorsal spurring, inferior and posterior calcaneal spurs were noted. Impression: These findings are consistent with osteoarthritis of the foot.  XR Hand 2 View Left  Result Date: 09/30/2019 Minimal PIP and DIP narrowing was noted.  Posttraumatic changes noted over the fifth  PIP joint.  No MCP, intercarpal or radiocarpal joint space narrowing was noted.  No erosive changes were noted. Impression: These findings are consistent with osteoarthritis of the hand.  XR Hand 2 View Right  Result Date: 09/30/2019 Minimal PIP and DIP narrowing was noted.  No MCP, intercarpal or radiocarpal joint space narrowing was noted.  No erosive changes were noted. Impression: These findings are consistent with osteoarthritis of the hand.  XR Pelvis 1-2 Views  Result Date: 09/30/2019 Bilateral SI joint sclerosis was noted.  No significant narrowing was noted. Impression: These findings are consistent with chronic sacroiliitis.   Recent Labs: Lab Results  Component Value Date   WBC 6.5 01/01/2019   HGB 15.9 01/01/2019   PLT 143 01/01/2019   NA 137 07/16/2019   K 4.6 07/16/2019   CL 99 07/16/2019   CO2 26 07/16/2019   GLUCOSE 137 (H) 07/16/2019   BUN 17 07/16/2019   CREATININE 0.95 07/16/2019   BILITOT 0.9 07/16/2019   ALKPHOS 108 11/06/2016   AST 63 (H) 07/16/2019   ALT 62 (H) 07/16/2019   PROT 7.5 07/16/2019   ALBUMIN 3.9 11/06/2016   CALCIUM 9.6 07/16/2019   GFRAA 103 07/16/2019    Speciality Comments: No specialty comments available.  Procedures:  No procedures performed Allergies: Azithromycin, Green dyes, Propranolol, and Zithromax [azithromycin dihydrate]   Assessment / Plan:     Visit Diagnoses: No diagnosis found.  Orders: No orders of the defined types were placed in this encounter.  No orders of the defined types were placed in this encounter.   Face-to-face time spent with patient was *** minutes. Greater than 50% of time was spent in counseling and coordination of care.  Follow-Up Instructions: No follow-ups on file.   Bo Merino, MD  Note - This record has been created using Editor, commissioning.  Chart creation errors have been sought, but may not always  have been located. Such creation errors do not reflect on  the standard of medical  care.

## 2019-10-15 ENCOUNTER — Ambulatory Visit: Payer: Commercial Managed Care - PPO | Admitting: Physician Assistant

## 2019-10-22 ENCOUNTER — Ambulatory Visit (INDEPENDENT_AMBULATORY_CARE_PROVIDER_SITE_OTHER): Payer: Commercial Managed Care - PPO | Admitting: Physician Assistant

## 2019-10-22 ENCOUNTER — Other Ambulatory Visit: Payer: Self-pay

## 2019-10-22 ENCOUNTER — Ambulatory Visit: Payer: Commercial Managed Care - PPO | Admitting: Rheumatology

## 2019-10-22 ENCOUNTER — Encounter: Payer: Self-pay | Admitting: Physician Assistant

## 2019-10-22 VITALS — BP 132/74 | HR 107 | Ht 70.5 in | Wt 214.0 lb

## 2019-10-22 DIAGNOSIS — E1129 Type 2 diabetes mellitus with other diabetic kidney complication: Secondary | ICD-10-CM

## 2019-10-22 DIAGNOSIS — M450 Ankylosing spondylitis of multiple sites in spine: Secondary | ICD-10-CM

## 2019-10-22 DIAGNOSIS — Z794 Long term (current) use of insulin: Secondary | ICD-10-CM

## 2019-10-22 DIAGNOSIS — I10 Essential (primary) hypertension: Secondary | ICD-10-CM | POA: Diagnosis not present

## 2019-10-22 DIAGNOSIS — R040 Epistaxis: Secondary | ICD-10-CM

## 2019-10-22 DIAGNOSIS — G250 Essential tremor: Secondary | ICD-10-CM | POA: Diagnosis not present

## 2019-10-22 DIAGNOSIS — F17200 Nicotine dependence, unspecified, uncomplicated: Secondary | ICD-10-CM

## 2019-10-22 DIAGNOSIS — R809 Proteinuria, unspecified: Secondary | ICD-10-CM | POA: Diagnosis not present

## 2019-10-22 LAB — POCT GLYCOSYLATED HEMOGLOBIN (HGB A1C): Hemoglobin A1C: 6.5 % — AB (ref 4.0–5.6)

## 2019-10-22 MED ORDER — PRIMIDONE 50 MG PO TABS: ORAL_TABLET | ORAL | 3 refills | Status: DC

## 2019-10-22 MED ORDER — TOUJEO MAX SOLOSTAR 300 UNIT/ML ~~LOC~~ SOPN: PEN_INJECTOR | SUBCUTANEOUS | 0 refills | Status: DC

## 2019-10-22 MED ORDER — LOSARTAN POTASSIUM 100 MG PO TABS: 100.0000 mg | ORAL_TABLET | Freq: Every day | ORAL | 3 refills | Status: DC

## 2019-10-22 MED ORDER — IMIPRAMINE HCL 50 MG PO TABS: 200.0000 mg | ORAL_TABLET | Freq: Every day | ORAL | 3 refills | Status: DC

## 2019-10-22 MED ORDER — OZEMPIC (1 MG/DOSE) 2 MG/1.5ML ~~LOC~~ SOPN: 1.0000 mg | PEN_INJECTOR | SUBCUTANEOUS | 0 refills | Status: DC

## 2019-10-22 NOTE — Patient Instructions (Addendum)
Increase toujeo up to 2 units every 5 days until fasting sugars 90-130 up to 40 units of toujeo.  Try nasal ayr gel in left nostril.

## 2019-10-22 NOTE — Progress Notes (Signed)
 Subjective:    Patient ID: Robert Frey, male    DOB: 10/03/1960, 58 y.o.   MRN: [id number]  HPI  Patient is a 58-year-old male with hypertension, type 2 diabetes, essential tremor, psoriatic arthritis, ankylosing spondylitis and spine and hypercholesterolemia who presents to the clinic for 3-month follow-up.  Patient is currently taking Toujeo 25 units at night, Ozempic 1 mg weekly, Xigduo XR.  He is checking his sugars in the morning and they are ranging 1 30-1 70.  His last A1c was 7.3.  His wife has tried to make some diet changes to decrease carbs and sugars.  He is active at work but no regular exercise.  He denies any hypoglycemic events.  He denies any open sores or wounds.  He denies any chest pain, palpitations, headaches.  Patient did admit to start back smoking regularly.  He admits he is doing this because of some stress in his life.  He will start to cut back.  He is taking his blood pressure medication losartan, HCTZ, amlodipine.  He reports that his readings have been very low at work around 110/60.  He went to the rheumatologist recently and it was around that as well.  He just took his blood pressure medication.  Patient does have an essential tremor which primidone helps significantly.  Has been a little worse for the last 2 days but it seemed like better today.  Pt has had a few nosebleeds on the left. Not using allergy medication or nasal sprays. Never had nosebleeds before. Able to stop them effectively.    . Active Ambulatory Problems    Diagnosis Date Noted  . HYPERCHOLESTEROLEMIA 03/06/2006  . OBESITY, NOS 03/06/2006  . OBSESSIVE COMPUL. DISORDER 03/06/2006  . KNEE PAIN, BILATERAL 12/28/2006  . Ankylosing spondylitis of multiple sites in spine ([name]) 09/30/2009  . Psoriatic arthritis ([name]) 10/11/2012  . Type 2 diabetes mellitus with microalbuminuria, with long-term current use of insulin ([name]) 10/11/2012  . Heel spur 01/20/2014  . Osteoarthritis of foot, left  01/20/2014  . Lung nodule < 6cm on CT 05/15/2014  . Essential hypertension 06/08/2015  . Microalbuminuria 06/08/2015  . Bruising 06/09/2015  . Lumbosacral strain 01/17/2017  . Dark urine 01/01/2019  . Shakes 04/04/2019  . Essential tremor 04/16/2019  . Shoulder separation, right, subsequent encounter 06/17/2019  . Primary osteoarthritis, right glenohumeral joint 07/02/2019  . Current smoker 10/22/2019   Resolved Ambulatory Problems    Diagnosis Date Noted  . ELEVATED BLOOD PRESSURE WITHOUT DIAGNOSIS OF HYPERTENSION 08/29/2006  . Cough 11/25/2010   Past Medical History:  Diagnosis Date  . Ankylosing spondylitis ([name])   . COPD (chronic obstructive pulmonary disease) ([name])   . Diabetes mellitus without complication ([name])   . High cholesterol   . Hypertension      Review of Systems See HPI.     Objective:   Physical Exam Vitals reviewed.  Constitutional:      Appearance: Normal appearance. He is obese.  HENT:     Head: Normocephalic.     Right Ear: Tympanic membrane normal. There is no impacted cerumen.     Left Ear: Tympanic membrane normal. There is no impacted cerumen.     Nose: Nose normal.     Comments: Left nare erythematous with a small uleration on nasal bridge.  Cardiovascular:     Rate and Rhythm: Normal rate and regular rhythm.  Neurological:     General: No focal deficit present.     Mental Status: He is   alert and oriented to person, place, and time.  Psychiatric:        Mood and Affect: Mood normal.           Assessment & Plan:  Marland KitchenMarland KitchenElnathan was seen today for diabetes.  Diagnoses and all orders for this visit:  Type 2 diabetes mellitus with microalbuminuria, with long-term current use of insulin ([name]) -     POCT glycosylated hemoglobin (Hb A1C) -     insulin glargine, 2 Unit Dial, (TOUJEO MAX SOLOSTAR) 300 UNIT/ML Solostar Pen; Inject 25 units hs, increase by 2 units daily until fasting sugars 90-130, -     Semaglutide, 1 MG/DOSE, (OZEMPIC, 1  MG/DOSE,) 2 MG/1.5ML SOPN; Inject 1 mg into the skin once a week.  Ankylosing spondylitis of multiple sites in spine ([name]) -     imipramine (TOFRANIL) 50 MG tablet; Take 4 tablets (200 mg total) by mouth at bedtime.  Essential hypertension -     losartan (COZAAR) 100 MG tablet; Take 1 tablet (100 mg total) by mouth daily.  Essential tremor -     primidone (MYSOLINE) 50 MG tablet; Take one tablet daily.  Current smoker  Nosebleed    Results for orders placed or performed in visit on 10/22/19  POCT glycosylated hemoglobin (Hb A1C)  Result Value Ref Range   Hemoglobin A1C 6.5 (A) 4.0 - 5.6 %   HbA1c POC (<> result, manual entry)     HbA1c, POC (prediabetic range)     HbA1c, POC (controlled diabetic range)     A1c is much better.  It is considered stable and controlled. Continue same medications. Did encourage patient to titrate up on Toujeo to get fasting glucose under 130.  He may increase by 2 units every 5 days until at 40 units or goal fasting blood glucose. Blood pressure not quite to goal.  Patient reports readings at home were to goal.  He just took his blood pressure medication.  On ARB. On statin.  Last LDL to goal Patient encouraged to get diabetic eye exam. Pneumonia, flu up-to-date.  Discussed smoking cessation.  Patient denies any medication help.  Patient agrees to cut back.  Patient aware of risk of smoking and diabetes.  Discussed need for colonoscopy.  Declines colonoscopy but does agree to have the Cologuard sent to his house.  It is waiting on him he is just not returned.  Strongly encourage patient to control Cologuard kit.  Discussed nosebleeds.  There is some erythema in his nasal cavity on the left. Suggested nasal gel. If not improving follow up.

## 2019-10-28 ENCOUNTER — Other Ambulatory Visit: Payer: Self-pay | Admitting: Physician Assistant

## 2019-10-28 DIAGNOSIS — I10 Essential (primary) hypertension: Secondary | ICD-10-CM

## 2019-10-28 DIAGNOSIS — E1129 Type 2 diabetes mellitus with other diabetic kidney complication: Secondary | ICD-10-CM

## 2019-10-30 NOTE — Progress Notes (Signed)
Office Visit Note  Patient: Robert Frey             Date of Birth: Apr 26, 1961           MRN: [id number]             PCP: Lavada Mesi Referring: Donella Stade, PA-C Visit Date: 11/10/2019 Occupation: @GUAROCC @  Subjective:  Medication monitoring.   History of Present Illness: Robert Frey is a 59 y.o. male with history of psoriatic arthritis, osteoarthritis and psoriasis.  [place] Nail was started at last visit and has a week left on his starter pack..  He has noticed a difference in itching and noticed improvement in his psoriasis patches on his back but they are still scaly on his ankles. He denies any recent infections.  He denies any issues obtaining medication from the pharmacy.  He denies any joint pain or joint swelling.  He has some lower back discomfort.  Activities of Daily Living:  Patient reports morning stiffness for  10  minutes.   Patient Reports nocturnal pain.  Difficulty dressing/grooming: Reports Difficulty climbing stairs: Reports Difficulty getting out of chair: Denies Difficulty using hands for taps, buttons, cutlery, and/or writing: Denies  Review of Systems  Constitutional: Negative for fatigue and night sweats.  HENT: Positive for mouth dryness. Negative for mouth sores and nose dryness.   Eyes: Negative for redness, itching and dryness.  Respiratory: Negative for shortness of breath and difficulty breathing.   Cardiovascular: Negative for chest pain, palpitations, hypertension, irregular heartbeat and swelling in legs/feet.  Gastrointestinal: Negative for blood in stool, constipation and diarrhea.  Endocrine: Negative for increased urination.  Genitourinary: Negative for difficulty urinating.  Musculoskeletal: Positive for arthralgias, joint pain and morning stiffness. Negative for joint swelling, myalgias, muscle weakness, muscle tenderness and myalgias.  Skin: Positive for rash. Negative for color change, hair loss, nodules/bumps, redness,  skin tightness, ulcers and sensitivity to sunlight.  Allergic/Immunologic: Negative for susceptible to infections.  Neurological: Negative for dizziness, fainting, numbness, headaches, memory loss, night sweats and weakness.  Hematological: Positive for bruising/bleeding tendency. Negative for swollen glands.  Psychiatric/Behavioral: Negative for depressed mood, confusion and sleep disturbance. The patient is not nervous/anxious.     PMFS History:  Patient Active Problem List   Diagnosis Date Noted  . Current smoker 10/22/2019  . Nosebleed 10/22/2019  . Primary osteoarthritis, right glenohumeral joint 07/02/2019  . Shoulder separation, right, subsequent encounter 06/17/2019  . Essential tremor 04/16/2019  . Shakes 04/04/2019  . Dark urine 01/01/2019  . Lumbosacral strain 01/17/2017  . Bruising 06/09/2015  . Essential hypertension 06/08/2015  . Microalbuminuria 06/08/2015  . Lung nodule < 6cm on CT 05/15/2014  . Heel spur 01/20/2014  . Osteoarthritis of foot, left 01/20/2014  . Psoriatic arthritis ([place]) 10/11/2012  . Type 2 diabetes mellitus with microalbuminuria, with long-term current use of insulin ([place]) 10/11/2012  . Ankylosing spondylitis of multiple sites in spine ([place]) 09/30/2009  . KNEE PAIN, BILATERAL 12/28/2006  . HYPERCHOLESTEROLEMIA 03/06/2006  . OBESITY, NOS 03/06/2006  . OBSESSIVE COMPUL. DISORDER 03/06/2006    Past Medical History:  Diagnosis Date  . Ankylosing spondylitis ([place])   . COPD (chronic obstructive pulmonary disease) ([place])   . Diabetes mellitus without complication ([place])   . High cholesterol   . Hypertension     Family History  Problem Relation Age of Onset  . Stroke Other   . Alcohol abuse Other   . Depression Other   . Hypertension Other   .  Hyperlipidemia Other   . Diabetes Mother   . Alzheimer's disease Mother   . Diabetes Sister   . Stroke Sister    Past Surgical History:  Procedure Laterality Date  . APPENDECTOMY    . CARPAL TUNNEL  RELEASE Right   . ent surgery    . TONSILLECTOMY AND ADENOIDECTOMY     Social History   Social History Narrative  . Not on file   Immunization History  Administered Date(s) Administered  . Influenza Whole 06/14/2006  . Influenza,inj,Quad PF,6+ Mos 01/17/2017  . Moderna SARS-COVID-2 Vaccination 07/31/2019, 08/21/2019  . Pneumococcal Polysaccharide-23 12/16/2008, 10/08/2017  . Tdap 12/28/2013     Objective: Vital Signs: BP (!) 146/86 (BP Location: Left Arm, Patient Position: Sitting, Cuff Size: Normal)   Pulse (!) 105   Resp 17   Ht 6' (1.829 m)   Wt 218 lb (98.9 kg)   BMI 29.57 kg/m    Physical Exam Vitals and nursing note reviewed.  Constitutional:      Appearance: He is well-developed.  HENT:     Head: Normocephalic and atraumatic.  Eyes:     Conjunctiva/sclera: Conjunctivae normal.     Pupils: Pupils are equal, round, and reactive to light.  Cardiovascular:     Rate and Rhythm: Normal rate and regular rhythm.     Heart sounds: Normal heart sounds.  Pulmonary:     Effort: Pulmonary effort is normal.     Breath sounds: Normal breath sounds.  Abdominal:     General: Bowel sounds are normal.     Palpations: Abdomen is soft.  Musculoskeletal:     Cervical back: Normal range of motion and neck supple.  Skin:    General: Skin is warm and dry.     Capillary Refill: Capillary refill takes less than 2 seconds.     Findings: Rash present.     Comments: Psoriasis patches on abdomen and lower extremities  Neurological:     Mental Status: He is alert and oriented to person, place, and time.  Psychiatric:        Behavior: Behavior normal.      Musculoskeletal Exam: C-spine was in good range of motion.  Shoulder joints, elbow joints, wrist joints were in good range of motion.  He has DIP and PIP thickening with no synovitis.  Hip joints, knee joints, ankles were in good range of motion with no synovitis.  CDAI Exam: CDAI Score: -- Patient Global: --; Provider Global:  -- Swollen: --; Tender: -- Joint Exam 11/10/2019   No joint exam has been documented for this visit   There is currently no information documented on the homunculus. Go to the Rheumatology activity and complete the homunculus joint exam.  Investigation: No additional findings.  Imaging: No results found.  Recent Labs: Lab Results  Component Value Date   WBC 6.5 01/01/2019   HGB 15.9 01/01/2019   PLT 143 01/01/2019   NA 137 07/16/2019   K 4.6 07/16/2019   CL 99 07/16/2019   CO2 26 07/16/2019   GLUCOSE 137 (H) 07/16/2019   BUN 17 07/16/2019   CREATININE 0.95 07/16/2019   BILITOT 0.9 07/16/2019   ALKPHOS 108 11/06/2016   AST 63 (H) 07/16/2019   ALT 62 (H) 07/16/2019   PROT 7.5 07/16/2019   ALBUMIN 3.9 11/06/2016   CALCIUM 9.6 07/16/2019   GFRAA 103 07/16/2019    Speciality Comments: No specialty comments available.  Procedures:  No procedures performed Allergies: Azithromycin, Green dyes, Propranolol, and Zithromax [azithromycin dihydrate]  Assessment / Plan:     Visit Diagnoses: Psoriatic arthritis ([place]) - History of inflammatory arthritis, sacroiliitis on the MRI.  Patient went into remission while he was on Enbrel.  He was placed on Otezla.  He has been tolerating the medication.  He states his rash is already improving.  He does not have much joint discomfort now.  No synovitis was noted on the examination today.  Psoriasis - He has active psoriasis of her abdomen and lower extremities.  High risk medication use-he is on Otezla 30 mg p.o. twice daily.  Sacroiliitis ([name])-currently not having much discomfort.  Elevated LFTs - Patient had work-up in the past by GI.  Primary osteoarthritis of both hands-joint protection muscle strengthening was discussed.  Primary osteoarthritis of both feet - X-rays were consistent with osteoarthritis.  DDD (degenerative disc disease), lumbar-chronic lower back pain.  Primary osteoarthritis, right glenohumeral  joint  Essential hypertension-blood pressure is elevated.  He states he did not take his blood pressure medication yesterday.  Dyslipidemia-weight loss diet and exercise was discussed.  Association of heart disease with psoriatic arthritis was also emphasized.  Type 2 diabetes mellitus with microalbuminuria, with long-term current use of insulin ([name])  Lung nodule < 6cm on CT - Scarring from pneumonia per patient.  Smoker - 1 pack/day for 25 years.   Essential tremor  Microalbuminuria  Orders: No orders of the defined types were placed in this encounter.  No orders of the defined types were placed in this encounter.     Follow-Up Instructions: Return in about 3 months (around 02/10/2020) for Psoriatic arthritis.   Bo Merino, MD  Note - This record has been created using Editor, commissioning.  Chart creation errors have been sought, but may not always  have been located. Such creation errors do not reflect on  the standard of medical care.

## 2019-11-04 ENCOUNTER — Other Ambulatory Visit: Payer: Self-pay | Admitting: Physician Assistant

## 2019-11-10 ENCOUNTER — Other Ambulatory Visit: Payer: Self-pay

## 2019-11-10 ENCOUNTER — Encounter: Payer: Self-pay | Admitting: Rheumatology

## 2019-11-10 ENCOUNTER — Ambulatory Visit (INDEPENDENT_AMBULATORY_CARE_PROVIDER_SITE_OTHER): Payer: Commercial Managed Care - PPO | Admitting: Rheumatology

## 2019-11-10 VITALS — BP 146/86 | HR 105 | Resp 17 | Ht 72.0 in | Wt 218.0 lb

## 2019-11-10 DIAGNOSIS — M19042 Primary osteoarthritis, left hand: Secondary | ICD-10-CM

## 2019-11-10 DIAGNOSIS — L409 Psoriasis, unspecified: Secondary | ICD-10-CM

## 2019-11-10 DIAGNOSIS — Z794 Long term (current) use of insulin: Secondary | ICD-10-CM

## 2019-11-10 DIAGNOSIS — M19072 Primary osteoarthritis, left ankle and foot: Secondary | ICD-10-CM

## 2019-11-10 DIAGNOSIS — M461 Sacroiliitis, not elsewhere classified: Secondary | ICD-10-CM

## 2019-11-10 DIAGNOSIS — M19071 Primary osteoarthritis, right ankle and foot: Secondary | ICD-10-CM

## 2019-11-10 DIAGNOSIS — G250 Essential tremor: Secondary | ICD-10-CM

## 2019-11-10 DIAGNOSIS — M5136 Other intervertebral disc degeneration, lumbar region: Secondary | ICD-10-CM

## 2019-11-10 DIAGNOSIS — Z79899 Other long term (current) drug therapy: Secondary | ICD-10-CM | POA: Diagnosis not present

## 2019-11-10 DIAGNOSIS — M19011 Primary osteoarthritis, right shoulder: Secondary | ICD-10-CM

## 2019-11-10 DIAGNOSIS — IMO0001 Reserved for inherently not codable concepts without codable children: Secondary | ICD-10-CM

## 2019-11-10 DIAGNOSIS — R809 Proteinuria, unspecified: Secondary | ICD-10-CM

## 2019-11-10 DIAGNOSIS — E1129 Type 2 diabetes mellitus with other diabetic kidney complication: Secondary | ICD-10-CM

## 2019-11-10 DIAGNOSIS — M19041 Primary osteoarthritis, right hand: Secondary | ICD-10-CM

## 2019-11-10 DIAGNOSIS — L4050 Arthropathic psoriasis, unspecified: Secondary | ICD-10-CM

## 2019-11-10 DIAGNOSIS — F17200 Nicotine dependence, unspecified, uncomplicated: Secondary | ICD-10-CM

## 2019-11-10 DIAGNOSIS — R911 Solitary pulmonary nodule: Secondary | ICD-10-CM

## 2019-11-10 DIAGNOSIS — R7989 Other specified abnormal findings of blood chemistry: Secondary | ICD-10-CM

## 2019-11-10 DIAGNOSIS — I10 Essential (primary) hypertension: Secondary | ICD-10-CM

## 2019-11-10 DIAGNOSIS — E785 Hyperlipidemia, unspecified: Secondary | ICD-10-CM

## 2019-11-10 LAB — COMPLETE METABOLIC PANEL WITH GFR
AG Ratio: 1.7 (calc) (ref 1.0–2.5)
ALT: 36 U/L (ref 9–46)
AST: 34 U/L (ref 10–35)
Albumin: 4.8 g/dL (ref 3.6–5.1)
Alkaline phosphatase (APISO): 71 U/L (ref 35–144)
BUN: 15 mg/dL (ref 7–25)
CO2: 23 mmol/L (ref 20–32)
Calcium: 10 mg/dL (ref 8.6–10.3)
Chloride: 102 mmol/L (ref 98–110)
Creat: 1 mg/dL (ref 0.70–1.33)
GFR, Est African American: 97 mL/min/{1.73_m2} (ref >=60)
GFR, Est Non African American: 84 mL/min/{1.73_m2} (ref >=60)
Globulin: 2.8 g/dL (calc) (ref 1.9–3.7)
Glucose, Bld: 104 mg/dL (ref 65–139)
Potassium: 4.3 mmol/L (ref 3.5–5.3)
Sodium: 135 mmol/L (ref 135–146)
Total Bilirubin: 0.4 mg/dL (ref 0.2–1.2)
Total Protein: 7.6 g/dL (ref 6.1–8.1)

## 2019-11-10 LAB — CBC WITH DIFFERENTIAL/PLATELET
Absolute Monocytes: 1325 cells/uL — ABNORMAL HIGH (ref 200–950)
Basophils Absolute: 77 cells/uL (ref 0–200)
Basophils Relative: 0.8 %
Eosinophils Absolute: 307 cells/uL (ref 15–500)
Eosinophils Relative: 3.2 %
HCT: 39.3 % (ref 38.5–50.0)
Hemoglobin: 13.9 g/dL (ref 13.2–17.1)
Lymphs Abs: 2458 cells/uL (ref 850–3900)
MCH: 33.5 pg — ABNORMAL HIGH (ref 27.0–33.0)
MCHC: 35.4 g/dL (ref 32.0–36.0)
MCV: 94.7 fL (ref 80.0–100.0)
MPV: 8.7 fL (ref 7.5–12.5)
Monocytes Relative: 13.8 %
Neutro Abs: 5434 cells/uL (ref 1500–7800)
Neutrophils Relative %: 56.6 %
Platelets: 195 10*3/uL (ref 140–400)
RBC: 4.15 10*6/uL — ABNORMAL LOW (ref 4.20–5.80)
RDW: 13.5 % (ref 11.0–15.0)
Total Lymphocyte: 25.6 %
WBC: 9.6 10*3/uL (ref 3.8–10.8)

## 2019-11-11 NOTE — Progress Notes (Signed)
CBC and CMP are normal.

## 2019-12-08 ENCOUNTER — Telehealth: Payer: Self-pay | Admitting: Physician Assistant

## 2019-12-08 NOTE — Telephone Encounter (Signed)
Received fax on 7/9 for PA on Ozempic sent through cover my meds waiting on determination. - CF

## 2020-01-21 ENCOUNTER — Ambulatory Visit: Payer: Commercial Managed Care - PPO | Admitting: Physician Assistant

## 2020-01-23 ENCOUNTER — Ambulatory Visit: Payer: Commercial Managed Care - PPO | Admitting: Physician Assistant

## 2020-01-28 ENCOUNTER — Encounter: Payer: Self-pay | Admitting: Physician Assistant

## 2020-01-28 ENCOUNTER — Ambulatory Visit (INDEPENDENT_AMBULATORY_CARE_PROVIDER_SITE_OTHER): Payer: Commercial Managed Care - PPO | Admitting: Physician Assistant

## 2020-01-28 VITALS — BP 126/72 | HR 105 | Ht 72.0 in | Wt 220.0 lb

## 2020-01-28 DIAGNOSIS — Z794 Long term (current) use of insulin: Secondary | ICD-10-CM

## 2020-01-28 DIAGNOSIS — I10 Essential (primary) hypertension: Secondary | ICD-10-CM | POA: Diagnosis not present

## 2020-01-28 DIAGNOSIS — E1140 Type 2 diabetes mellitus with diabetic neuropathy, unspecified: Secondary | ICD-10-CM | POA: Insufficient documentation

## 2020-01-28 DIAGNOSIS — R809 Proteinuria, unspecified: Secondary | ICD-10-CM

## 2020-01-28 DIAGNOSIS — E1129 Type 2 diabetes mellitus with other diabetic kidney complication: Secondary | ICD-10-CM | POA: Diagnosis not present

## 2020-01-28 DIAGNOSIS — E1342 Other specified diabetes mellitus with diabetic polyneuropathy: Secondary | ICD-10-CM | POA: Diagnosis not present

## 2020-01-28 DIAGNOSIS — Z23 Encounter for immunization: Secondary | ICD-10-CM

## 2020-01-28 LAB — POCT GLYCOSYLATED HEMOGLOBIN (HGB A1C): Hemoglobin A1C: 6.6 % — AB (ref 4.0–5.6)

## 2020-01-28 MED ORDER — OZEMPIC (1 MG/DOSE) 2 MG/1.5ML ~~LOC~~ SOPN: 1.0000 mg | PEN_INJECTOR | SUBCUTANEOUS | 3 refills | Status: DC

## 2020-01-28 MED ORDER — XIGDUO XR 10-1000 MG PO TB24: 1.0000 | ORAL_TABLET | Freq: Every day | ORAL | 2 refills | Status: DC

## 2020-01-28 MED ORDER — FOLTANX RF 3-90.314-2-35 MG PO CAPS: 1.0000 | ORAL_CAPSULE | Freq: Two times a day (BID) | ORAL | 11 refills | Status: DC

## 2020-01-28 NOTE — Progress Notes (Signed)
Subjective:    Patient ID: Robert Frey, male    DOB: 07-01-1960, 59 y.o.   MRN: [id number]  HPI  Pt is a 59 yo male with HTN, T2DM, Psoriatic arthritis who presents to the clinic for 3 month follow up.   He is randomly checking sugars but not in am. Usually mid day 140s to 175. He is taking 20 units of insulin at bedtime. He is compliant with medication. No open sores or wounds. He has started to have some burning/shooting bilateral foot pain for last month. No injury or medication change. Seems to be worse at night and in the morning but better throughout the day. No swelling. Not tried anything to make better.   No CP, palpitations, headaches, or vision changes.   .. Active Ambulatory Problems    Diagnosis Date Noted  . HYPERCHOLESTEROLEMIA 03/06/2006  . OBESITY, NOS 03/06/2006  . OBSESSIVE COMPUL. DISORDER 03/06/2006  . Ankylosing spondylitis of multiple sites in spine ([place]) 09/30/2009  . Psoriatic arthritis ([place]) 10/11/2012  . Type 2 diabetes mellitus with microalbuminuria, with long-term current use of insulin ([place]) 10/11/2012  . Heel spur 01/20/2014  . Osteoarthritis of foot, left 01/20/2014  . Lung nodule < 6cm on CT 05/15/2014  . Essential hypertension 06/08/2015  . Microalbuminuria 06/08/2015  . Bruising 06/09/2015  . Lumbosacral strain 01/17/2017  . Dark urine 01/01/2019  . Shakes 04/04/2019  . Essential tremor 04/16/2019  . Shoulder separation, right, subsequent encounter 06/17/2019  . Primary osteoarthritis, right glenohumeral joint 07/02/2019  . Current smoker 10/22/2019  . Nosebleed 10/22/2019  . Diabetic neuropathy ([place]) 01/28/2020   Resolved Ambulatory Problems    Diagnosis Date Noted  . KNEE PAIN, BILATERAL 12/28/2006  . ELEVATED BLOOD PRESSURE WITHOUT DIAGNOSIS OF HYPERTENSION 08/29/2006  . Cough 11/25/2010   Past Medical History:  Diagnosis Date  . Ankylosing spondylitis ([place])   . COPD (chronic obstructive pulmonary disease) ([place])   . Diabetes  mellitus without complication ([place])   . High cholesterol   . Hypertension     Review of Systems  All other systems reviewed and are negative.      Objective:   Physical Exam Vitals reviewed.  Constitutional:      Appearance: Normal appearance. He is obese.  HENT:     Head: Normocephalic.  Neck:     Vascular: No carotid bruit.  Cardiovascular:     Rate and Rhythm: Normal rate and regular rhythm.     Pulses: Normal pulses.     Heart sounds: Normal heart sounds.  Pulmonary:     Effort: Pulmonary effort is normal.     Breath sounds: Normal breath sounds.  Musculoskeletal:     Right lower leg: No edema.     Left lower leg: No edema.  Neurological:     General: No focal deficit present.     Mental Status: He is alert and oriented to person, place, and time.  Psychiatric:        Mood and Affect: Mood normal.           Assessment & Plan:  Marland KitchenMarland KitchenArtemis was seen today for diabetes.  Diagnoses and all orders for this visit:  Type 2 diabetes mellitus with microalbuminuria, with long-term current use of insulin ([name]) -     POCT glycosylated hemoglobin (Hb A1C) -     Ambulatory referral to Ophthalmology -     Semaglutide, 1 MG/DOSE, (OZEMPIC, 1 MG/DOSE,) 2 MG/1.5ML SOPN; Inject 0.75 mLs (1 mg total) into the skin  once a week. -     Dapagliflozin-metFORMIN HCl ER (XIGDUO XR) 02-999 MG TB24; Take 1 tablet by mouth daily.  Flu vaccine need -     Flu Vaccine QUAD 36+ mos IM  Diabetic polyneuropathy associated with other specified diabetes mellitus ([place]) -     L-Methylfolate-Algae-B12-B6 (FOLTANX RF) 3-90.314-2-35 MG CAPS; Take 1 tablet by mouth in the morning and at bedtime.  Essential hypertension   .Marland Kitchen Lab Results  Component Value Date   HGBA1C 6.6 (A) 01/28/2020   A1C controlled and stable.  Continue same medications.  Check glucose in morning and if ranging below 100 cut back on nightly insulin.  Refilled medications.  On ARB. BP to goal.  On STATIN. LDL to goal.   .. Diabetic Foot Exam - Simple   Simple Foot Form Diabetic Foot exam was performed with the following findings: Yes 01/28/2020  8:16 AM  Visual Inspection No deformities, no ulcerations, no other skin breakdown bilaterally: Yes Sensation Testing See comments: Yes Pulse Check Posterior Tibialis and Dorsalis pulse intact bilaterally: Yes Comments No feeling to monofilament testing at the following: small toes of left and right foot, inside pad of right foot   sounds like neuropathy from diabetes. HO given. Start foltanx first to see if any improvement.  Ordered eye exam.  Covid, pneumonia, flu UTD.  Follow up in 3 months.   Pt declines colonoscopy and cologuard.   2nd recheck BP stable/controlled. No need for refills at this time.

## 2020-01-28 NOTE — Patient Instructions (Signed)

## 2020-02-08 ENCOUNTER — Other Ambulatory Visit: Payer: Self-pay | Admitting: Physician Assistant

## 2020-02-08 DIAGNOSIS — E1129 Type 2 diabetes mellitus with other diabetic kidney complication: Secondary | ICD-10-CM

## 2020-02-08 DIAGNOSIS — Z794 Long term (current) use of insulin: Secondary | ICD-10-CM

## 2020-02-11 LAB — HM DIABETES EYE EXAM

## 2020-02-25 NOTE — Telephone Encounter (Signed)
Ozempic is approved from 01/01/2019 - 12/31/2020 previous case number for approval is [id number]. - CF

## 2020-04-06 ENCOUNTER — Other Ambulatory Visit: Payer: Self-pay | Admitting: Physician Assistant

## 2020-04-06 DIAGNOSIS — I10 Essential (primary) hypertension: Secondary | ICD-10-CM

## 2020-04-13 ENCOUNTER — Other Ambulatory Visit: Payer: Self-pay | Admitting: Physician Assistant

## 2020-04-13 DIAGNOSIS — E1129 Type 2 diabetes mellitus with other diabetic kidney complication: Secondary | ICD-10-CM

## 2020-04-28 ENCOUNTER — Ambulatory Visit: Payer: Commercial Managed Care - PPO | Admitting: Physician Assistant

## 2020-04-30 ENCOUNTER — Ambulatory Visit: Payer: Commercial Managed Care - PPO | Admitting: Physician Assistant

## 2020-05-06 ENCOUNTER — Other Ambulatory Visit: Payer: Self-pay | Admitting: Physician Assistant

## 2020-05-06 DIAGNOSIS — I10 Essential (primary) hypertension: Secondary | ICD-10-CM

## 2020-07-01 ENCOUNTER — Telehealth: Payer: Self-pay | Admitting: Neurology

## 2020-07-01 NOTE — Telephone Encounter (Signed)
Prior Authorization for Vascepa submitted via covermymeds. Awaiting response.

## 2020-07-06 ENCOUNTER — Telehealth (INDEPENDENT_AMBULATORY_CARE_PROVIDER_SITE_OTHER): Payer: Commercial Managed Care - PPO | Admitting: Physician Assistant

## 2020-07-06 ENCOUNTER — Encounter: Payer: Self-pay | Admitting: Physician Assistant

## 2020-07-06 VITALS — Temp 96.7°F | Ht 72.0 in | Wt 220.0 lb

## 2020-07-06 DIAGNOSIS — R519 Headache, unspecified: Secondary | ICD-10-CM

## 2020-07-06 DIAGNOSIS — R0981 Nasal congestion: Secondary | ICD-10-CM

## 2020-07-06 NOTE — Progress Notes (Signed)
Patient ID: Robert Frey, male   DOB: [birth year], 60 y.o.   MRN: [id number] .Marland KitchenVirtual Visit via Telephone Note  I connected with Robert Frey on 07/06/20 at  3:20 PM EST by telephone and verified that I am speaking with the correct person using two identifiers.  Location: Patient: home Provider: clinic  .Marland KitchenParticipating in visit:  Patient: Robert Frey Provider: Iran Planas PA-C   I discussed the limitations, risks, security and privacy concerns of performing an evaluation and management service by telephone and the availability of in person appointments. I also discussed with the patient that there may be a patient responsible charge related to this service. The patient expressed understanding and agreed to proceed.   History of Present Illness: Patient is a 60 year old obese male with type 2 diabetes, hypertension, hyperlipidemia, psoriatic arthritis who presents to the clinic to discuss 1 day of congestion, headache, neck pain.  Yesterday he went home from work with this triad of symptoms.  He took some over-the-counter Mucinex and this morning he woke up feeling much better.  He denies any shortness of breath, cough, sinus pressure, body aches, loss of smell or taste, ear pain, sore throat.  He did take a negative rapid Covid test.  He is fully vaccinated.  He denies any known contact with a sick Covid patient.  Note emailed. [email or web address]  .Marland Kitchen Active Ambulatory Problems    Diagnosis Date Noted  . HYPERCHOLESTEROLEMIA 03/06/2006  . OBESITY, NOS 03/06/2006  . OBSESSIVE COMPUL. DISORDER 03/06/2006  . Ankylosing spondylitis of multiple sites in spine ([place]) 09/30/2009  . Psoriatic arthritis ([place]) 10/11/2012  . Type 2 diabetes mellitus with microalbuminuria, with long-term current use of insulin ([place]) 10/11/2012  . Heel spur 01/20/2014  . Osteoarthritis of foot, left 01/20/2014  . Lung nodule < 6cm on CT 05/15/2014  . Essential hypertension 06/08/2015  . Microalbuminuria  06/08/2015  . Bruising 06/09/2015  . Lumbosacral strain 01/17/2017  . Dark urine 01/01/2019  . Shakes 04/04/2019  . Essential tremor 04/16/2019  . Shoulder separation, right, subsequent encounter 06/17/2019  . Primary osteoarthritis, right glenohumeral joint 07/02/2019  . Current smoker 10/22/2019  . Nosebleed 10/22/2019  . Diabetic neuropathy ([place]) 01/28/2020   Resolved Ambulatory Problems    Diagnosis Date Noted  . KNEE PAIN, BILATERAL 12/28/2006  . ELEVATED BLOOD PRESSURE WITHOUT DIAGNOSIS OF HYPERTENSION 08/29/2006  . Cough 11/25/2010   Past Medical History:  Diagnosis Date  . Ankylosing spondylitis ([place])   . COPD (chronic obstructive pulmonary disease) ([place])   . Diabetes mellitus without complication ([place])   . High cholesterol   . Hypertension    Reviewed med, allergy, problem list.    Observations/Objective: No acute distress No coughing, wheezing or labored breathing.  .. Today's Vitals   07/06/20 1441  Temp: (!) 96.7 F (35.9 C)  TempSrc: Oral  Weight: 220 lb (99.8 kg)  Height: 6' (1.829 m)   Body mass index is 29.84 kg/m.    Assessment and Plan: Marland KitchenMarland KitchenSyair was seen today for nasal congestion.  Diagnoses and all orders for this visit:  Acute nonintractable headache, unspecified headache type  Sinus congestion   Patient had negative Covid test.  We are out of PCR.  He is completely asymptomatic currently.  I okayed him to go back to work tomorrow if he does not develop any worsening or new symptoms.  I did instruct him not to take any over-the-counter medication.  If he does become symptomatic please call office and we will extend him for a  full 5-day quarantine.    Follow Up Instructions:    I discussed the assessment and treatment plan with the patient. The patient was provided an opportunity to ask questions and all were answered. The patient agreed with the plan and demonstrated an understanding of the instructions.   The patient was advised to  call back or seek an in-person evaluation if the symptoms worsen or if the condition fails to improve as anticipated.  I provided 10 minutes of non-face-to-face time during this encounter.   Iran Planas, PA-C

## 2020-07-06 NOTE — Progress Notes (Signed)
Symptoms started yesterday: Congestion  Headache Neck pain  Taking [facility] brand decongestant - helping  Ibuprofen - helping headache/neck pain  No sick contacts  Work wanted him seen

## 2020-07-12 ENCOUNTER — Other Ambulatory Visit: Payer: Self-pay | Admitting: Physician Assistant

## 2020-07-12 DIAGNOSIS — E1129 Type 2 diabetes mellitus with other diabetic kidney complication: Secondary | ICD-10-CM

## 2020-07-12 DIAGNOSIS — Z794 Long term (current) use of insulin: Secondary | ICD-10-CM

## 2020-08-01 ENCOUNTER — Other Ambulatory Visit: Payer: Self-pay | Admitting: Physician Assistant

## 2020-08-01 DIAGNOSIS — I10 Essential (primary) hypertension: Secondary | ICD-10-CM

## 2020-08-09 ENCOUNTER — Encounter: Payer: Self-pay | Admitting: Physician Assistant

## 2020-08-09 ENCOUNTER — Ambulatory Visit (INDEPENDENT_AMBULATORY_CARE_PROVIDER_SITE_OTHER): Payer: Commercial Managed Care - PPO | Admitting: Physician Assistant

## 2020-08-09 ENCOUNTER — Ambulatory Visit (INDEPENDENT_AMBULATORY_CARE_PROVIDER_SITE_OTHER): Payer: Commercial Managed Care - PPO

## 2020-08-09 ENCOUNTER — Other Ambulatory Visit: Payer: Self-pay

## 2020-08-09 ENCOUNTER — Other Ambulatory Visit: Payer: Self-pay | Admitting: Physician Assistant

## 2020-08-09 VITALS — BP 156/76 | HR 106 | Temp 98.8°F | Ht 72.0 in | Wt 218.0 lb

## 2020-08-09 DIAGNOSIS — M450 Ankylosing spondylitis of multiple sites in spine: Secondary | ICD-10-CM

## 2020-08-09 DIAGNOSIS — I10 Essential (primary) hypertension: Secondary | ICD-10-CM

## 2020-08-09 DIAGNOSIS — E1342 Other specified diabetes mellitus with diabetic polyneuropathy: Secondary | ICD-10-CM

## 2020-08-09 DIAGNOSIS — Z794 Long term (current) use of insulin: Secondary | ICD-10-CM

## 2020-08-09 DIAGNOSIS — M545 Low back pain, unspecified: Secondary | ICD-10-CM

## 2020-08-09 DIAGNOSIS — R809 Proteinuria, unspecified: Secondary | ICD-10-CM

## 2020-08-09 DIAGNOSIS — E1129 Type 2 diabetes mellitus with other diabetic kidney complication: Secondary | ICD-10-CM

## 2020-08-09 LAB — POCT URINALYSIS DIP (CLINITEK)
Bilirubin, UA: NEGATIVE
Blood, UA: NEGATIVE
Glucose, UA: 500 mg/dL — AB
Ketones, POC UA: NEGATIVE mg/dL
Leukocytes, UA: NEGATIVE
Nitrite, UA: NEGATIVE
POC PROTEIN,UA: NEGATIVE
Spec Grav, UA: 1.01 (ref 1.010–1.025)
Urobilinogen, UA: 0.2 E.U./dL
pH, UA: 5.5 (ref 5.0–8.0)

## 2020-08-09 MED ORDER — HYDROCODONE-ACETAMINOPHEN 7.5-325 MG PO TABS: 1.0000 | ORAL_TABLET | Freq: Four times a day (QID) | ORAL | 0 refills | Status: AC | PRN

## 2020-08-09 MED ORDER — TOUJEO MAX SOLOSTAR 300 UNIT/ML ~~LOC~~ SOPN: PEN_INJECTOR | SUBCUTANEOUS | 1 refills | Status: DC

## 2020-08-09 MED ORDER — KETOROLAC TROMETHAMINE 60 MG/2ML IM SOLN
60.0000 mg | Freq: Once | INTRAMUSCULAR | Status: AC
  Administered 2020-08-09: 60 mg via INTRAMUSCULAR

## 2020-08-09 MED ORDER — PREGABALIN 50 MG PO CAPS: 50.0000 mg | ORAL_CAPSULE | Freq: Two times a day (BID) | ORAL | 2 refills | Status: DC

## 2020-08-09 MED ORDER — PREDNISONE 20 MG PO TABS: ORAL_TABLET | ORAL | 0 refills | Status: DC

## 2020-08-09 NOTE — Patient Instructions (Addendum)
Start prednisone. Start lyrica twice a day.   Low Back Sprain or Strain Rehab Ask your health care provider which exercises are safe for you. Do exercises exactly as told by your health care provider and adjust them as directed. It is normal to feel mild stretching, pulling, tightness, or discomfort as you do these exercises. Stop right away if you feel sudden pain or your pain gets worse. Do not begin these exercises until told by your health care provider. Stretching and range-of-motion exercises These exercises warm up your muscles and joints and improve the movement and flexibility of your back. These exercises also help to relieve pain, numbness, and tingling. Lumbar rotation 1. Lie on your back on a firm surface and bend your knees. 2. Straighten your arms out to your sides so each arm forms a 90-degree angle (right angle) with a side of your body. 3. Slowly move (rotate) both of your knees to one side of your body until you feel a stretch in your lower back (lumbar). Try not to let your shoulders lift off the floor. 4. Hold this position for __________ seconds. 5. Tense your abdominal muscles and slowly move your knees back to the starting position. 6. Repeat this exercise on the other side of your body. Repeat __________ times. Complete this exercise __________ times a day.   Single knee to chest 1. Lie on your back on a firm surface with both legs straight. 2. Bend one of your knees. Use your hands to move your knee up toward your chest until you feel a gentle stretch in your lower back and buttock. ? Hold your leg in this position by holding on to the front of your knee. ? Keep your other leg as straight as possible. 3. Hold this position for __________ seconds. 4. Slowly return to the starting position. 5. Repeat with your other leg. Repeat __________ times. Complete this exercise __________ times a day.   Prone extension on elbows 1. Lie on your abdomen on a firm surface (prone  position). 2. Prop yourself up on your elbows. 3. Use your arms to help lift your chest up until you feel a gentle stretch in your abdomen and your lower back. ? This will place some of your body weight on your elbows. If this is uncomfortable, try stacking pillows under your chest. ? Your hips should stay down, against the surface that you are lying on. Keep your hip and back muscles relaxed. 4. Hold this position for __________ seconds. 5. Slowly relax your upper body and return to the starting position. Repeat __________ times. Complete this exercise __________ times a day.   Strengthening exercises These exercises build strength and endurance in your back. Endurance is the ability to use your muscles for a long time, even after they get tired. Pelvic tilt This exercise strengthens the muscles that lie deep in the abdomen. 1. Lie on your back on a firm surface. Bend your knees and keep your feet flat on the floor. 2. Tense your abdominal muscles. Tip your pelvis up toward the ceiling and flatten your lower back into the floor. ? To help with this exercise, you may place a small towel under your lower back and try to push your back into the towel. 3. Hold this position for __________ seconds. 4. Let your muscles relax completely before you repeat this exercise. Repeat __________ times. Complete this exercise __________ times a day. Alternating arm and leg raises 1. Get on your hands and knees on  a firm surface. If you are on a hard floor, you may want to use padding, such as an exercise mat, to cushion your knees. 2. Line up your arms and legs. Your hands should be directly below your shoulders, and your knees should be directly below your hips. 3. Lift your left leg behind you. At the same time, raise your right arm and straighten it in front of you. ? Do not lift your leg higher than your hip. ? Do not lift your arm higher than your shoulder. ? Keep your abdominal and back muscles  tight. ? Keep your hips facing the ground. ? Do not arch your back. ? Keep your balance carefully, and do not hold your breath. 4. Hold this position for __________ seconds. 5. Slowly return to the starting position. 6. Repeat with your right leg and your left arm. Repeat __________ times. Complete this exercise __________ times a day.   Abdominal set with straight leg raise 1. Lie on your back on a firm surface. 2. Bend one of your knees and keep your other leg straight. 3. Tense your abdominal muscles and lift your straight leg up, 4-6 inches (10-15 cm) off the ground. 4. Keep your abdominal muscles tight and hold this position for __________ seconds. ? Do not hold your breath. ? Do not arch your back. Keep it flat against the ground. 5. Keep your abdominal muscles tense as you slowly lower your leg back to the starting position. 6. Repeat with your other leg. Repeat __________ times. Complete this exercise __________ times a day.   Single leg lower with bent knees 1. Lie on your back on a firm surface. 2. Tense your abdominal muscles and lift your feet off the floor, one foot at a time, so your knees and hips are bent in 90-degree angles (right angles). ? Your knees should be over your hips and your lower legs should be parallel to the floor. 3. Keeping your abdominal muscles tense and your knee bent, slowly lower one of your legs so your toe touches the ground. 4. Lift your leg back up to return to the starting position. ? Do not hold your breath. ? Do not let your back arch. Keep your back flat against the ground. 5. Repeat with your other leg. Repeat __________ times. Complete this exercise __________ times a day. Posture and body mechanics Good posture and healthy body mechanics can help to relieve stress in your body's tissues and joints. Body mechanics refers to the movements and positions of your body while you do your daily activities. Posture is part of body mechanics. Good  posture means:  Your spine is in its natural S-curve position (neutral).  Your shoulders are pulled back slightly.  Your head is not tipped forward. Follow these guidelines to improve your posture and body mechanics in your everyday activities. Standing  When standing, keep your spine neutral and your feet about hip width apart. Keep a slight bend in your knees. Your ears, shoulders, and hips should line up.  When you do a task in which you stand in one place for a long time, place one foot up on a stable object that is 2-4 inches (5-10 cm) high, such as a footstool. This helps keep your spine neutral.   Sitting  When sitting, keep your spine neutral and keep your feet flat on the floor. Use a footrest, if necessary, and keep your thighs parallel to the floor. Avoid rounding your shoulders, and avoid tilting your  head forward.  When working at a desk or a computer, keep your desk at a height where your hands are slightly lower than your elbows. Slide your chair under your desk so you are close enough to maintain good posture.  When working at a computer, place your monitor at a height where you are looking straight ahead and you do not have to tilt your head forward or downward to look at the screen.   Resting  When lying down and resting, avoid positions that are most painful for you.  If you have pain with activities such as sitting, bending, stooping, or squatting, lie in a position in which your body does not bend very much. For example, avoid curling up on your side with your arms and knees near your chest (fetal position).  If you have pain with activities such as standing for a long time or reaching with your arms, lie with your spine in a neutral position and bend your knees slightly. Try the following positions: ? Lying on your side with a pillow between your knees. ? Lying on your back with a pillow under your knees. Lifting  When lifting objects, keep your feet at least  shoulder width apart and tighten your abdominal muscles.  Bend your knees and hips and keep your spine neutral. It is important to lift using the strength of your legs, not your back. Do not lock your knees straight out.  Always ask for help to lift heavy or awkward objects.   This information is not intended to replace advice given to you by your health care provider. Make sure you discuss any questions you have with your health care provider. Document Revised: 09/06/2018 Document Reviewed: 06/06/2018 Elsevier Patient Education  2021 [place].   Gastroparesis  Gastroparesis is a condition in which food takes longer than normal to empty from the stomach. This condition is also known as delayed gastric emptying. It is usually a long-term (chronic) condition. There is no cure, but there are treatments and things that you can do at home to help relieve symptoms. Treating the underlying condition that causes gastroparesis can also help relieve symptoms. What are the causes? In many cases, the cause of this condition is not known. Possible causes include:  A hormone (endocrine) disorder, such as hypothyroidism or diabetes.  A nervous system disease, such as Parkinson's disease or multiple sclerosis.  Cancer, infection, or surgery that affects the stomach or vagus nerve. The vagus nerve runs from your chest, through your neck, and to the lower part of your brain.  A connective tissue disorder, such as scleroderma.  Certain medicines. What increases the risk? You are more likely to develop this condition if:  You have certain disorders or diseases. These may include: ? An endocrine disorder. ? An eating disorder. ? Amyloidosis. ? Scleroderma. ? Parkinson's disease. ? Multiple sclerosis. ? Cancer or infection of the stomach or the vagus nerve.  You have had surgery on your stomach or vagus nerve.  You take certain medicines.  You are male. What are the signs or  symptoms? Symptoms of this condition include:  Feeling full after eating very little or a loss of appetite.  Nausea, vomiting, or heartburn.  Bloating of your abdomen.  Inconsistent blood sugar (glucose) levels on blood tests.  Unexplained weight loss.  Acid from the stomach coming up into the esophagus (gastroesophageal reflux).  Sudden tightening (spasm) of the stomach, which can be painful. Symptoms may come and go. Some people  may not notice any symptoms. How is this diagnosed? This condition is diagnosed with tests, such as:  Tests that check how long it takes food to move through the stomach and intestines. These tests include: ? Upper gastrointestinal (GI) series. For this test, you drink a liquid that shows up well on X-rays, and then X-rays are taken of your intestines. ? Gastric emptying scintigraphy. For this test, you eat food that contains a small amount of radioactive material, and then scans are taken. ? Wireless capsule GI monitoring system. For this test, you swallow a pill (capsule) that records information about how foods and fluid move through your stomach.  Gastric manometry. For this test, a tube is passed down your throat and into your stomach to measure electrical and muscular activity.  Endoscopy. For this test, a long, thin tube with a camera and light on the end is passed down your throat and into your stomach to check for problems in your stomach lining.  Ultrasound. This test uses sound waves to create images of the inside of your body. This can help rule out gallbladder disease or pancreatitis as a cause of your symptoms. How is this treated? There is no cure for this condition, but treatment and home care may relieve symptoms. Treatment may include:  Treating the underlying cause.  Managing your symptoms by making changes to your diet and exercise habits.  Taking medicines to control nausea and vomiting and to stimulate stomach muscles.  Getting  food through a feeding tube in the hospital. This may be done in severe cases.  Having surgery to insert a device called a gastric electrical stimulator into your body. This device helps improve stomach emptying and control nausea and vomiting. Follow these instructions at home:  Take over-the-counter and prescription medicines only as told by your health care provider.  Follow instructions from your health care provider about eating or drinking restrictions. Your health care provider may recommend that you: ? Eat smaller meals more often. ? Eat low-fat foods. ? Eat low-fiber forms of high-fiber foods. For example, eat cooked vegetables instead of raw vegetables. ? Have only liquid foods instead of solid foods. Liquid foods are easier to digest.  Drink enough fluid to keep your urine pale yellow.  Exercise as often as told by your health care provider.  Keep all follow-up visits. This is important. Contact a health care provider if you:  Notice that your symptoms do not improve with treatment.  Have new symptoms. Get help right away if you:  Have severe pain in your abdomen that does not improve with treatment.  Have nausea that is severe or does not go away.  Vomit every time you drink fluids. Summary  Gastroparesis is a long-term (chronic) condition in which food takes longer than normal to empty from the stomach.  Symptoms include nausea, vomiting, heartburn, bloating of your abdomen, and loss of appetite.  Eating smaller portions, low-fat foods, and low-fiber forms of high-fiber foods may help you manage your symptoms.  Get help right away if you have severe pain in your abdomen. This information is not intended to replace advice given to you by your health care provider. Make sure you discuss any questions you have with your health care provider. Document Revised: 09/22/2019 Document Reviewed: 09/22/2019 Elsevier Patient Education  2021 [employer].

## 2020-08-09 NOTE — Progress Notes (Signed)
Subjective:    Patient ID: Robert Frey, male    DOB: 1960-12-27, 60 y.o.   MRN: [id number]  HPI  Patient is a 60 year old male with type 2 diabetes, psoriatic arthritis, osteoarthritis, ankylosing spondylitis who presents to the clinic with mid to low back pain for the past week.  Pain seems to be worsening.  He wonders about a kidney stone.  He denies any dysuria, increase in frequency, fever, nausea, vomiting, body aches.  He denies any radiation of pain into the legs.  He denies any saddle anesthesia or bowel or bladder dysfunction.  Pain with nearly all range of motion.  Pain with any twisting or bending of spine.  Seems to be worse on the left side.  He has a history of needing injections in back years ago.  Denies any known injury.  He missed his diabetic appointment because his mother passed away.  He is out of Toujeo.  He wonders if he can get his A1c checked today.  .. Active Ambulatory Problems    Diagnosis Date Noted  . HYPERCHOLESTEROLEMIA 03/06/2006  . OBESITY, NOS 03/06/2006  . OBSESSIVE COMPUL. DISORDER 03/06/2006  . Ankylosing spondylitis of multiple sites in spine ([place]) 09/30/2009  . Psoriatic arthritis ([place]) 10/11/2012  . Type 2 diabetes mellitus with microalbuminuria, with long-term current use of insulin ([place]) 10/11/2012  . Heel spur 01/20/2014  . Osteoarthritis of foot, left 01/20/2014  . Lung nodule < 6cm on CT 05/15/2014  . Essential hypertension 06/08/2015  . Microalbuminuria 06/08/2015  . Bruising 06/09/2015  . Lumbosacral strain 01/17/2017  . Dark urine 01/01/2019  . Shakes 04/04/2019  . Essential tremor 04/16/2019  . Shoulder separation, right, subsequent encounter 06/17/2019  . Primary osteoarthritis, right glenohumeral joint 07/02/2019  . Current smoker 10/22/2019  . Nosebleed 10/22/2019  . Diabetic neuropathy ([place]) 01/28/2020   Resolved Ambulatory Problems    Diagnosis Date Noted  . KNEE PAIN, BILATERAL 12/28/2006  . ELEVATED BLOOD PRESSURE  WITHOUT DIAGNOSIS OF HYPERTENSION 08/29/2006  . Cough 11/25/2010   Past Medical History:  Diagnosis Date  . Ankylosing spondylitis ([place])   . COPD (chronic obstructive pulmonary disease) ([place])   . Diabetes mellitus without complication ([place])   . High cholesterol   . Hypertension      Review of Systems See HPI.     Objective:   Physical Exam Vitals reviewed.  Constitutional:      Comments: In pain.   Abdominal:     General: Bowel sounds are normal. There is no distension.     Palpations: Abdomen is soft. There is no mass.     Tenderness: There is no abdominal tenderness. There is no right CVA tenderness, left CVA tenderness or guarding.  Musculoskeletal:     Comments: Tenderness to palpation over lumbar spine L2-3-4.  Tight paraspinal muscles bilateral lumbar spine.  Strength lower ext 5/5.  No radicular pain with SLR but tightness and pain in lumbar spine.  Pain with flexion or extension at waist.   Neurological:     General: No focal deficit present.     Mental Status: He is oriented to person, place, and time.  Psychiatric:        Mood and Affect: Mood normal.       ..     Assessment & Plan:  Marland KitchenMarland KitchenRaahim was seen today for back pain.  Diagnoses and all orders for this visit:  Acute left-sided low back pain without sciatica -     POCT URINALYSIS DIP (CLINITEK) -  DG Lumbar Spine Complete -     predniSONE (DELTASONE) 20 MG tablet; Take one tablet twice a day  for 5 days. -     COMPLETE METABOLIC PANEL WITH GFR -     pregabalin (LYRICA) 50 MG capsule; Take 1 capsule (50 mg total) by mouth 2 (two) times daily. -     HYDROcodone-acetaminophen (NORCO) 7.5-325 MG tablet; Take 1 tablet by mouth every 6 (six) hours as needed for up to 5 days. -     ketorolac (TORADOL) injection 60 mg  Type 2 diabetes mellitus with microalbuminuria, with long-term current use of insulin ([name]) -     Hemoglobin A1c -     insulin glargine, 2 Unit Dial, (TOUJEO MAX SOLOSTAR) 300 UNIT/ML  Solostar Pen; INJECT 25 UNITS AT BEDTIME. INCREASE BY 2 UNITS DAILY UNTIL FASTING SUGARS ARE BETWEEN 90-130  Ankylosing spondylitis of multiple sites in spine ([name]) -     pregabalin (LYRICA) 50 MG capsule; Take 1 capsule (50 mg total) by mouth 2 (two) times daily.  Diabetic polyneuropathy associated with other specified diabetes mellitus ([place]) -     pregabalin (LYRICA) 50 MG capsule; Take 1 capsule (50 mg total) by mouth 2 (two) times daily.   UA is clean with no blood or signs of infection and presents more musculoskeletal than kidney stone.  Pt has hx of back pain and ankylosing spondylitis of multiple sites and tenderness over lumbar spine to palpation. No red flags today.  No recent xray. Ordered lumbar today.  Shot of toradol 60mg  IM.  Start prednisone burst tomorrow. Warned of sugar elevation.  Ibuprofen as needed after complete prednisone burst. Kidney function good. Start lyrica daily.   norco as needed small quantity.  Marland KitchenMarland KitchenPDMP reviewed during this encounter. No concerns.  Given low back exercises to start and discussed formal PT.  Consider good posture and good lifting techniques.   Pt is late for A1C check.  Ordered labs in blood.  Refilled toujeo per patient out.  Will discuss once labs in.   Declines smoking cessation.   Follow up in 1 month.

## 2020-08-10 ENCOUNTER — Other Ambulatory Visit: Payer: Self-pay | Admitting: Physician Assistant

## 2020-08-10 DIAGNOSIS — E1129 Type 2 diabetes mellitus with other diabetic kidney complication: Secondary | ICD-10-CM

## 2020-08-10 DIAGNOSIS — Z794 Long term (current) use of insulin: Secondary | ICD-10-CM

## 2020-08-10 NOTE — Telephone Encounter (Signed)
Please let patient know sending another medication because on back order. Needs BP recheck in one month.

## 2020-08-10 NOTE — Telephone Encounter (Signed)
Patient made aware and will schedule appt.

## 2020-08-11 NOTE — Progress Notes (Signed)
Robert Frey,   How are you feeling today?  You have lots of disc narrowing and degenerative changes throughout lumbar spine. Certainly reasons to cause pain. I do think following up with orthopedic/sports medicine if pain continues is a great idea. Hopefully lyrica and prednisone is helping.

## 2020-08-13 ENCOUNTER — Other Ambulatory Visit: Payer: Self-pay | Admitting: Physician Assistant

## 2020-08-24 ENCOUNTER — Other Ambulatory Visit: Payer: Self-pay | Admitting: Physician Assistant

## 2020-08-24 DIAGNOSIS — I10 Essential (primary) hypertension: Secondary | ICD-10-CM

## 2020-10-02 ENCOUNTER — Other Ambulatory Visit: Payer: Self-pay | Admitting: Physician Assistant

## 2020-10-02 DIAGNOSIS — Z794 Long term (current) use of insulin: Secondary | ICD-10-CM

## 2020-10-02 DIAGNOSIS — E1129 Type 2 diabetes mellitus with other diabetic kidney complication: Secondary | ICD-10-CM

## 2020-10-16 LAB — LIPID PANEL
Cholesterol: 163 (ref 0–200)
HDL: 29 — AB (ref 35–70)
LDL Cholesterol: 100
Triglycerides: 410 — AB (ref 40–160)

## 2020-11-03 ENCOUNTER — Other Ambulatory Visit: Payer: Self-pay | Admitting: Physician Assistant

## 2020-11-03 DIAGNOSIS — G250 Essential tremor: Secondary | ICD-10-CM

## 2020-11-03 DIAGNOSIS — I10 Essential (primary) hypertension: Secondary | ICD-10-CM

## 2020-11-09 ENCOUNTER — Encounter: Payer: Self-pay | Admitting: Physician Assistant

## 2020-11-09 ENCOUNTER — Other Ambulatory Visit: Payer: Self-pay

## 2020-11-09 ENCOUNTER — Ambulatory Visit (INDEPENDENT_AMBULATORY_CARE_PROVIDER_SITE_OTHER): Payer: Commercial Managed Care - PPO | Admitting: Physician Assistant

## 2020-11-09 VITALS — BP 122/70 | HR 106 | Ht 72.0 in | Wt 224.0 lb

## 2020-11-09 DIAGNOSIS — Z23 Encounter for immunization: Secondary | ICD-10-CM

## 2020-11-09 DIAGNOSIS — R809 Proteinuria, unspecified: Secondary | ICD-10-CM | POA: Diagnosis not present

## 2020-11-09 DIAGNOSIS — E7800 Pure hypercholesterolemia, unspecified: Secondary | ICD-10-CM | POA: Diagnosis not present

## 2020-11-09 DIAGNOSIS — R413 Other amnesia: Secondary | ICD-10-CM

## 2020-11-09 DIAGNOSIS — E1129 Type 2 diabetes mellitus with other diabetic kidney complication: Secondary | ICD-10-CM | POA: Diagnosis not present

## 2020-11-09 DIAGNOSIS — Z794 Long term (current) use of insulin: Secondary | ICD-10-CM | POA: Diagnosis not present

## 2020-11-09 DIAGNOSIS — Z1211 Encounter for screening for malignant neoplasm of colon: Secondary | ICD-10-CM

## 2020-11-09 DIAGNOSIS — I10 Essential (primary) hypertension: Secondary | ICD-10-CM | POA: Diagnosis not present

## 2020-11-09 DIAGNOSIS — Z820 Family history of epilepsy and other diseases of the nervous system: Secondary | ICD-10-CM

## 2020-11-09 DIAGNOSIS — G250 Essential tremor: Secondary | ICD-10-CM | POA: Diagnosis not present

## 2020-11-09 DIAGNOSIS — E785 Hyperlipidemia, unspecified: Secondary | ICD-10-CM

## 2020-11-09 LAB — COMPLETE METABOLIC PANEL WITH GFR
AG Ratio: 1.6 (calc) (ref 1.0–2.5)
ALT: 60 U/L — ABNORMAL HIGH (ref 9–46)
AST: 70 U/L — ABNORMAL HIGH (ref 10–35)
Albumin: 4.7 g/dL (ref 3.6–5.1)
Alkaline phosphatase (APISO): 67 U/L (ref 35–144)
BUN: 12 mg/dL (ref 7–25)
CO2: 24 mmol/L (ref 20–32)
Calcium: 9.7 mg/dL (ref 8.6–10.3)
Chloride: 98 mmol/L (ref 98–110)
Creat: 0.85 mg/dL (ref 0.70–1.33)
GFR, Est African American: 112 mL/min/{1.73_m2} (ref >=60)
GFR, Est Non African American: 97 mL/min/{1.73_m2} (ref >=60)
Globulin: 2.9 g/dL (calc) (ref 1.9–3.7)
Glucose, Bld: 196 mg/dL — ABNORMAL HIGH (ref 65–99)
Potassium: 4.4 mmol/L (ref 3.5–5.3)
Sodium: 131 mmol/L — ABNORMAL LOW (ref 135–146)
Total Bilirubin: 0.6 mg/dL (ref 0.2–1.2)
Total Protein: 7.6 g/dL (ref 6.1–8.1)

## 2020-11-09 LAB — POCT GLYCOSYLATED HEMOGLOBIN (HGB A1C): Hemoglobin A1C: 7 % — AB (ref 4.0–5.6)

## 2020-11-09 MED ORDER — DONEPEZIL HCL 10 MG PO TABS: 5.0000 mg | ORAL_TABLET | Freq: Every evening | ORAL | 2 refills | Status: DC | PRN

## 2020-11-09 MED ORDER — REPATHA 140 MG/ML ~~LOC~~ SOSY
140.0000 mg | PREFILLED_SYRINGE | SUBCUTANEOUS | 5 refills | Status: DC
Start: 2020-11-09 — End: 2021-09-26

## 2020-11-09 MED ORDER — PRIMIDONE 50 MG PO TABS: ORAL_TABLET | ORAL | 3 refills | Status: DC

## 2020-11-09 MED ORDER — AMLODIPINE BESYLATE 5 MG PO TABS: 1.0000 | ORAL_TABLET | Freq: Every day | ORAL | 3 refills | Status: DC

## 2020-11-09 MED ORDER — XIGDUO XR 10-1000 MG PO TB24: 1.0000 | ORAL_TABLET | Freq: Every day | ORAL | 0 refills | Status: DC

## 2020-11-09 MED ORDER — OLMESARTAN MEDOXOMIL 20 MG PO TABS
20.0000 mg | ORAL_TABLET | Freq: Every day | ORAL | 1 refills | Status: DC
Start: 2020-11-09 — End: 2021-06-10

## 2020-11-09 MED ORDER — ATORVASTATIN CALCIUM 80 MG PO TABS
80.0000 mg | ORAL_TABLET | Freq: Every day | ORAL | 3 refills | Status: DC
Start: 2020-11-09 — End: 2021-12-02

## 2020-11-09 MED ORDER — HYDROCHLOROTHIAZIDE 25 MG PO TABS: 1.0000 | ORAL_TABLET | Freq: Every day | ORAL | 3 refills | Status: DC

## 2020-11-09 NOTE — Patient Instructions (Signed)
Colonoscopy ordered.  Added repatha every 2 weeks. Will send to pharmacy.   Memory Compensation Strategies  Use "WARM" strategy.  W= write it down  A= associate it  R= repeat it  M= make a mental note  2.   You can keep a Social worker.  Use a 3-ring notebook with sections for the following: calendar, important names and phone numbers,  medications, doctors' names/phone numbers, lists/reminders, and a section to journal what you did  each day.   3.    Use a calendar to write appointments down.  4.    Write yourself a schedule for the day.  This can be placed on the calendar or in a separate section of the Memory Notebook.  Keeping a  regular schedule can help memory.  5.    Use medication organizer with sections for each day or morning/evening pills.  You may need help loading it  6.    Keep a basket, or pegboard by the door.  Place items that you need to take out with you in the basket or on the pegboard.  You may also want to  include a message board for reminders.  7.    Use sticky notes.  Place sticky notes with reminders in a place where the task is performed.  For example: " turn off the  stove" placed by the stove, "lock the door" placed on the door at eye level, " take your medications" on  the bathroom mirror or by the place where you normally take your medications.  8.    Use alarms/timers.  Use while cooking to remind yourself to check on food or as a reminder to take your medicine, or as a  reminder to make a call, or as a reminder to perform another task, etc.

## 2020-11-09 NOTE — Progress Notes (Signed)
Subjective:    Patient ID: Robert Frey, male    DOB: 1961/04/21, 60 y.o.   MRN: [id number]  HPI Patient is a 60 year old obese male with type 2 diabetes, hypertension, hyperlipidemia, essential tremor, psoriatic arthritis who presents to the clinic for 62-month follow-up.  Patient is not checking his sugars regularly.  He does try to check in the morning and most of the time they are under 130.  He did have some labs done at work.  LDL 100, triglycerides 410, blood pressure 120/70, blood glucose 163.  Patient has been on Toujeo 25 units at bedtime, Ozempic 1 mg weekly and Xigduo.  He has been out of his Merleen Nicely for the last month.  He tries to watch his diet and stay active.  He denies any hypoglycemic events.  He declines any open sores or wounds.  He does not have any chest pain, palpitations, headaches or vision changes.  He is recently been more concerned with some memory changes.  He has noticed that he will forget people's names who he has known for years and forget conversations.  His wife feels like it has been going on for longer than he has.  This does worry him since his mom had Alzheimer's.  It has not affected his work function as he is writing most of the important things down.   .. Active Ambulatory Problems    Diagnosis Date Noted   HYPERCHOLESTEROLEMIA 03/06/2006   OBESITY, NOS 03/06/2006   OBSESSIVE COMPUL. DISORDER 03/06/2006   Ankylosing spondylitis of multiple sites in spine ([place]) 09/30/2009   Psoriatic arthritis ([place]) 10/11/2012   Type 2 diabetes mellitus with microalbuminuria, with long-term current use of insulin ([place]) 10/11/2012   Heel spur 01/20/2014   Osteoarthritis of foot, left 01/20/2014   Lung nodule < 6cm on CT 05/15/2014   Essential hypertension 06/08/2015   Microalbuminuria 06/08/2015   Bruising 06/09/2015   Lumbosacral strain 01/17/2017   Dark urine 01/01/2019   Shakes 04/04/2019   Essential tremor 04/16/2019   Shoulder separation, right,  subsequent encounter 06/17/2019   Primary osteoarthritis, right glenohumeral joint 07/02/2019   Current smoker 10/22/2019   Nosebleed 10/22/2019   Diabetic neuropathy ([place]) 01/28/2020   Family history of Alzheimer's disease 11/09/2020   Resolved Ambulatory Problems    Diagnosis Date Noted   KNEE PAIN, BILATERAL 12/28/2006   ELEVATED BLOOD PRESSURE WITHOUT DIAGNOSIS OF HYPERTENSION 08/29/2006   Cough 11/25/2010   Past Medical History:  Diagnosis Date   Ankylosing spondylitis ([place])    COPD (chronic obstructive pulmonary disease) ([place])    Diabetes mellitus without complication ([place])    High cholesterol    Hypertension       Review of Systems  All other systems reviewed and are negative.     Objective:   Physical Exam Vitals reviewed.  Constitutional:      Appearance: Normal appearance. He is obese.  HENT:     Head: Normocephalic.  Neck:     Vascular: No carotid bruit.  Cardiovascular:     Rate and Rhythm: Normal rate and regular rhythm.     Pulses: Normal pulses.  Neurological:     Mental Status: He is alert and oriented to person, place, and time.  Psychiatric:        Mood and Affect: Mood normal.      .. Results for orders placed or performed in visit on 11/09/20  POCT glycosylated hemoglobin (Hb A1C)  Result Value Ref Range   Hemoglobin A1C 7.0 (A)  4.0 - 5.6 %   HbA1c POC (<> result, manual entry)     HbA1c, POC (prediabetic range)     HbA1c, POC (controlled diabetic range)     .Marland Kitchen MMSE - Mini Mental State Exam 11/09/2020  Orientation to time 5  Orientation to Place 4  Registration 3  Attention/ Calculation 2  Recall 3  Language- name 2 objects 2  Language- repeat 1  Language- follow 3 step command 3  Language- read & follow direction 1  Write a sentence 1  Copy design 1  Total score 26       Assessment & Plan:  Marland KitchenMarland KitchenHerschell was seen today for diabetes.  Diagnoses and all orders for this visit:  Type 2 diabetes mellitus with microalbuminuria,  with long-term current use of insulin ([name]) -     POCT glycosylated hemoglobin (Hb A1C) -     COMPLETE METABOLIC PANEL WITH GFR -     Dapagliflozin-metFORMIN HCl ER (XIGDUO XR) 02-999 MG TB24; Take 1 tablet by mouth daily.  Essential hypertension -     COMPLETE METABOLIC PANEL WITH GFR -     amLODipine (NORVASC) 5 MG tablet; Take 1 tablet (5 mg total) by mouth daily. -     hydrochlorothiazide (HYDRODIURIL) 25 MG tablet; Take 1 tablet (25 mg total) by mouth daily. -     olmesartan (BENICAR) 20 MG tablet; Take 1 tablet (20 mg total) by mouth daily.  HYPERCHOLESTEROLEMIA -     atorvastatin (LIPITOR) 80 MG tablet; Take 1 tablet (80 mg total) by mouth daily.  Essential tremor -     primidone (MYSOLINE) 50 MG tablet; TAKE 1 TABLET BY MOUTH EVERY DAY  Family history of Alzheimer's disease  Colon cancer screening -     Ambulatory referral to Gastroenterology  Need for shingles vaccine -     Varicella-zoster vaccine IM  Hyperlipidemia LDL goal <70 -     atorvastatin (LIPITOR) 80 MG tablet; Take 1 tablet (80 mg total) by mouth daily. -     Evolocumab (REPATHA) 140 MG/ML SOSY; Inject 140 mg into the skin every 14 (fourteen) days.  Memory changes -     donepezil (ARICEPT) 10 MG tablet; Take 0.5 tablets (5 mg total) by mouth at bedtime as needed.   A1C up some but right at goal of 7.  Pt has not been on xigduo. Resent to pharmacy.  Continue on ozempic and toujeo.  BP to goal at second recheck. Refilled benicar, HCTZ, norvasc. On ARB. On lipitor 80mg . LDL 100. Added repatha.  TG 400 get sugars down. Make sure taking vascepa.  Foot/eye exam UTD.  Covid/flu/pneumonia.  First shingrix given. Follow up in 2-6 months.   Pt refused smoking cessation.   Colonoscopy ordered.     MMSE 26/30.  Discussed memory.  Family hx of alzheimers.  HO given.  Started aricept. Follow up in 3 months.

## 2020-11-10 NOTE — Progress Notes (Signed)
Robert Frey,   Kidney looks great.  Liver enzymes popped back up. Could be the rise in glucose. Are you drinking alcohol? You do have fatty liver which if not eating healthy can effect liver enzymes.  Sodium dropped some too. Don't restrict salt. Recheck in 4 weeks.

## 2020-11-11 ENCOUNTER — Other Ambulatory Visit: Payer: Self-pay | Admitting: Neurology

## 2020-11-11 DIAGNOSIS — E871 Hypo-osmolality and hyponatremia: Secondary | ICD-10-CM

## 2020-11-11 DIAGNOSIS — R748 Abnormal levels of other serum enzymes: Secondary | ICD-10-CM

## 2020-11-13 ENCOUNTER — Other Ambulatory Visit: Payer: Self-pay | Admitting: Physician Assistant

## 2020-11-13 DIAGNOSIS — E1129 Type 2 diabetes mellitus with other diabetic kidney complication: Secondary | ICD-10-CM

## 2020-12-15 ENCOUNTER — Telehealth: Payer: Self-pay | Admitting: Neurology

## 2020-12-15 DIAGNOSIS — E785 Hyperlipidemia, unspecified: Secondary | ICD-10-CM

## 2020-12-15 NOTE — Telephone Encounter (Signed)
Tried to initiate PA for patient via Covermymeds for Repatha. They can not find patient. Can we contact and see if patient had an insurance change? No insurance card scanned into the system this year.

## 2020-12-15 NOTE — Telephone Encounter (Signed)
PA submitted.  [employer] is reviewing your PA request and will respond within 24 hours for Medicaid or up to 72 hours for non-Medicaid plans, based on the required timeframe determined by state or federal regulations. To check for an update later, open this request from your dashboard.

## 2020-12-15 NOTE — Telephone Encounter (Signed)
Called patient and he sent me insurance card via email, printed and scanned new insurance card for 2022 into patient's chart. AM

## 2020-12-21 ENCOUNTER — Other Ambulatory Visit: Payer: Self-pay | Admitting: Physician Assistant

## 2020-12-21 DIAGNOSIS — M450 Ankylosing spondylitis of multiple sites in spine: Secondary | ICD-10-CM

## 2020-12-21 NOTE — Telephone Encounter (Signed)
Medication: Repatha syringe 140 mg/mL Prior authorization determination received Medication has been denied Reason for denial:  Coverage is provided in situations where the patient's coronary artery calcium or calcification (CAC) score is greater than or equal to 300 [place] units. Coverage cannot be authorized at this time

## 2020-12-27 NOTE — Telephone Encounter (Signed)
See if patient is ok with going to a lipid specialist. I do think in his best interest to be on this medication and get cholesterol to goal. They might be able to get approved.

## 2021-01-04 NOTE — Telephone Encounter (Signed)
Spoke with pt and he is agreeable to seeing a lipid specialist. Unsure how to enter order for referral.

## 2021-01-05 NOTE — Addendum Note (Signed)
Addended byDonella Stade on: 01/05/2021 02:10 PM   Modules accepted: Orders

## 2021-02-09 ENCOUNTER — Ambulatory Visit: Payer: Commercial Managed Care - PPO | Admitting: Physician Assistant

## 2021-03-01 ENCOUNTER — Encounter: Payer: Self-pay | Admitting: Physician Assistant

## 2021-03-01 ENCOUNTER — Ambulatory Visit (INDEPENDENT_AMBULATORY_CARE_PROVIDER_SITE_OTHER): Payer: Commercial Managed Care - PPO

## 2021-03-01 ENCOUNTER — Ambulatory Visit (INDEPENDENT_AMBULATORY_CARE_PROVIDER_SITE_OTHER): Payer: Commercial Managed Care - PPO | Admitting: Physician Assistant

## 2021-03-01 ENCOUNTER — Other Ambulatory Visit: Payer: Self-pay

## 2021-03-01 VITALS — BP 168/79 | HR 103 | Ht 72.0 in | Wt 223.0 lb

## 2021-03-01 DIAGNOSIS — E1129 Type 2 diabetes mellitus with other diabetic kidney complication: Secondary | ICD-10-CM

## 2021-03-01 DIAGNOSIS — W06XXXA Fall from bed, initial encounter: Secondary | ICD-10-CM

## 2021-03-01 DIAGNOSIS — Z23 Encounter for immunization: Secondary | ICD-10-CM | POA: Diagnosis not present

## 2021-03-01 DIAGNOSIS — R809 Proteinuria, unspecified: Secondary | ICD-10-CM | POA: Diagnosis not present

## 2021-03-01 DIAGNOSIS — R0781 Pleurodynia: Secondary | ICD-10-CM | POA: Diagnosis not present

## 2021-03-01 DIAGNOSIS — Z794 Long term (current) use of insulin: Secondary | ICD-10-CM | POA: Diagnosis not present

## 2021-03-01 DIAGNOSIS — W19XXXA Unspecified fall, initial encounter: Secondary | ICD-10-CM | POA: Diagnosis not present

## 2021-03-01 LAB — POCT GLYCOSYLATED HEMOGLOBIN (HGB A1C): Hemoglobin A1C: 6.4 % — AB (ref 4.0–5.6)

## 2021-03-01 MED ORDER — KETOROLAC TROMETHAMINE 60 MG/2ML IM SOLN
60.0000 mg | Freq: Once | INTRAMUSCULAR | Status: AC
  Administered 2021-03-01: 60 mg via INTRAMUSCULAR

## 2021-03-01 MED ORDER — OZEMPIC (1 MG/DOSE) 2 MG/1.5ML ~~LOC~~ SOPN: 1.0000 mg | PEN_INJECTOR | SUBCUTANEOUS | 0 refills | Status: DC

## 2021-03-01 MED ORDER — HYDROCODONE-ACETAMINOPHEN 5-325 MG PO TABS: 1.0000 | ORAL_TABLET | Freq: Four times a day (QID) | ORAL | 0 refills | Status: AC | PRN

## 2021-03-01 NOTE — Progress Notes (Signed)
Great news no fractures seen. Clear lungs. Likely pain is more contusion and inflammation of cartilage in between ribs called costochondritis. Continue with same treatment plan but anti-inflammatories are much more effective at contusions/inflammation pain control.

## 2021-03-01 NOTE — Progress Notes (Signed)
Subjective:    Patient ID: Robert Frey, male    DOB: Jul 31, 1960, 60 y.o.   MRN: [id number]  HPI Pt is a 60 yo male with T2DM, HTN, HLD who presents to the clinic for 3 month follow up.   Pt is checking sugars and running 120 to 150 in mornings. No hypoglycemic events. No open sores or wounds. Taking medications as directed. Not exercising. Denies any CP, palpitations, headaches or vision changes.   Pt does admit to falling out of bed in the middle of the night and landing on left sided ribs. He is in a lot of pain today. Pain worse with breathing. Taken some ibuprofen with little relief.   Memory seems to be getting a little better. Wife thinks changes were due to stress of so many people dying and life changes. Not started aricept.    .. Active Ambulatory Problems    Diagnosis Date Noted   HYPERCHOLESTEROLEMIA 03/06/2006   OBESITY, NOS 03/06/2006   OBSESSIVE COMPUL. DISORDER 03/06/2006   Ankylosing spondylitis of multiple sites in spine ([place]) 09/30/2009   Psoriatic arthritis ([place]) 10/11/2012   Type 2 diabetes mellitus with microalbuminuria, with long-term current use of insulin ([place]) 10/11/2012   Heel spur 01/20/2014   Osteoarthritis of foot, left 01/20/2014   Lung nodule < 6cm on CT 05/15/2014   Essential hypertension 06/08/2015   Microalbuminuria 06/08/2015   Bruising 06/09/2015   Lumbosacral strain 01/17/2017   Dark urine 01/01/2019   Shakes 04/04/2019   Essential tremor 04/16/2019   Shoulder separation, right, subsequent encounter 06/17/2019   Primary osteoarthritis, right glenohumeral joint 07/02/2019   Current smoker 10/22/2019   Nosebleed 10/22/2019   Diabetic neuropathy ([place]) 01/28/2020   Family history of Alzheimer's disease 11/09/2020   Rib pain on left side 03/07/2021   Fall 03/07/2021   Resolved Ambulatory Problems    Diagnosis Date Noted   KNEE PAIN, BILATERAL 12/28/2006   ELEVATED BLOOD PRESSURE WITHOUT DIAGNOSIS OF HYPERTENSION 08/29/2006   Cough  11/25/2010   Past Medical History:  Diagnosis Date   Ankylosing spondylitis ([place])    COPD (chronic obstructive pulmonary disease) ([place])    Diabetes mellitus without complication ([place])    High cholesterol    Hypertension     Review of Systems  All other systems reviewed and are negative.     Objective:   Physical Exam Vitals reviewed.  Constitutional:      Appearance: Normal appearance. He is obese.  HENT:     Head: Normocephalic.  Neck:     Vascular: No carotid bruit.  Cardiovascular:     Rate and Rhythm: Normal rate and regular rhythm.     Pulses: Normal pulses.     Heart sounds: Normal heart sounds.  Pulmonary:     Effort: Pulmonary effort is normal.     Breath sounds: Normal breath sounds.  Abdominal:     General: Bowel sounds are normal.  Musculoskeletal:     Right lower leg: No edema.     Left lower leg: No edema.     Comments: No rib bruising or swelling.  Pain to palpation over right lower ribs.  Pain noted with deep breathing.   Neurological:     General: No focal deficit present.     Mental Status: He is alert and oriented to person, place, and time.  Psychiatric:        Mood and Affect: Mood normal.     .. MMSE - Mini Mental State Exam 03/01/2021 11/09/2020  Orientation to time 5 5  Orientation to Place 5 4  Registration 2 3  Attention/ Calculation 5 2  Recall 3 3  Language- name 2 objects 2 2  Language- repeat 1 1  Language- follow 3 step command 3 3  Language- read & follow direction 1 1  Write a sentence 1 1  Copy design 1 1  Total score 29 26        Assessment & Plan:  Marland KitchenMarland KitchenHulbert was seen today for diabetes.  Diagnoses and all orders for this visit:  Type 2 diabetes mellitus with microalbuminuria, with long-term current use of insulin ([name]) -     POCT glycosylated hemoglobin (Hb A1C) -     COMPLETE METABOLIC PANEL WITH GFR -     Semaglutide, 1 MG/DOSE, (OZEMPIC, 1 MG/DOSE,) 2 MG/1.5ML SOPN; Inject 1 mg into the skin once a week. -      Lipid Panel w/reflex Direct LDL  Rib pain on left side -     DG Ribs Unilateral W/Chest Left; Future -     HYDROcodone-acetaminophen (NORCO/VICODIN) 5-325 MG tablet; Take 1 tablet by mouth every 6 (six) hours as needed for up to 5 days for moderate pain. -     ketorolac (TORADOL) injection 60 mg  Fall, initial encounter -     DG Ribs Unilateral W/Chest Left; Future  Flu vaccine need -     Flu Vaccine QUAD 42mo+IM (Fluarix, Fluzone & Alfiuria Quad PF)  A1C looks good.  Stay on same medications.  BP not to goal today but in pain.  On statin.  Declined foot exam today.  Needs eye exam.  Covid vaccine UtD.  Flu shot given today.   Discussed Fall out of bed. Consider a bed rail.  Xray ordered and confirmed no fracture.  Discussed symptomatic treatment of bad contusion.  Toradol injection given today.  Continue NSAID, ice, norco for break through pain.  Marland Kitchen.PDMP reviewed during this encounter.  Rechecked MMSE.  Improved significantly.  Will hold on referrals.  Likely due to overwhelming stress in last year.

## 2021-03-01 NOTE — Patient Instructions (Addendum)
Will make referral for neurology Start ozempic back at .5mg  for 4 weeks then increase to 1mg  If sugars in morning are under 90 then back down from toujeo by 2 units.  Goal morning sugars 90-120.   Ibuprofen and tylenol alternate every 4-6 hours with norco for breakthrough pain.   Rib Fracture A rib fracture is a break or crack in one of the bones of the ribs. The ribs are like a cage that goes around your upper chest. A broken or cracked rib is often painful, but most do not cause other problems. Most rib fractures usually heal on their own in 1-3 months. What are the causes? Doing movements over and over again with a lot of force, such as pitching a baseball or having a very bad cough. A direct hit to the chest. Cancer that has spread to the bones. What are the signs or symptoms? Pain when you breathe in or cough. Pain when someone presses on the injured area. Feeling short of breath. How is this treated? Treatment depends on how bad the fracture is. In general: Most rib fractures usually heal on their own in 1-3 months. Healing may take longer if you have a cough or are doing activities that make the injury worse. While you heal, you may be given medicines to control pain. You will also be taught deep breathing exercises. Very bad injuries may require a stay at the hospital or surgery. Follow these instructions at home: Managing pain, stiffness, and swelling If told, put ice on the injured area. To do this: Put ice in a plastic bag. Place a towel between your skin and the bag. Leave the ice on for 20 minutes, 2-3 times a day. Take off the ice if your skin turns bright red. This is very important. If you cannot feel pain, heat, or cold, you have a greater risk of damage to the area. Take over-the-counter and prescription medicines only as told by your doctor. Activity Avoid activities that cause pain to the injured area. Protect your injured area. Slowly increase activity as told  by your doctor. General instructions Do deep breathing exercises as told by your doctor. You may be told to: Take deep breaths many times a day. Cough several times a day while hugging a pillow. Use a device (incentive spirometer) to do deep breathing many times a day. Drink enough fluid to keep your pee (urine) clear or pale yellow. Do not wear a rib belt or binder. Keep all follow-up visits. Contact a doctor if: You have a fever. Get help right away if: You have trouble breathing. You are short of breath. You cannot stop coughing. You cough up thick or bloody spit. You feel like you may vomit (nauseous), vomit, or have belly (abdominal) pain. Your pain gets worse and medicine does not help. These symptoms may be an emergency. Get help right away. Call your local emergency services (911 in the [place] Do not wait to see if the symptoms will go away. Do not drive yourself to the hospital. Summary A rib fracture is a break or crack in one of the bones of the ribs. Apply ice to the injured area and take medicines for pain as told by your doctor. Take deep breaths and cough several times a day. Hug a pillow every time you cough. This information is not intended to replace advice given to you by your health care provider. Make sure you discuss any questions you have with your health care provider. Document  Revised: 09/05/2019 Document Reviewed: 09/05/2019 Elsevier Patient Education  [place].

## 2021-03-02 ENCOUNTER — Other Ambulatory Visit: Payer: Self-pay | Admitting: Physician Assistant

## 2021-03-02 LAB — COMPLETE METABOLIC PANEL WITH GFR
AG Ratio: 1.4 (calc) (ref 1.0–2.5)
ALT: 45 U/L (ref 9–46)
AST: 49 U/L — ABNORMAL HIGH (ref 10–35)
Albumin: 4.6 g/dL (ref 3.6–5.1)
Alkaline phosphatase (APISO): 82 U/L (ref 35–144)
BUN: 9 mg/dL (ref 7–25)
CO2: 24 mmol/L (ref 20–32)
Calcium: 10 mg/dL (ref 8.6–10.3)
Chloride: 100 mmol/L (ref 98–110)
Creat: 0.93 mg/dL (ref 0.70–1.30)
Globulin: 3.4 g/dL (calc) (ref 1.9–3.7)
Glucose, Bld: 134 mg/dL — ABNORMAL HIGH (ref 65–99)
Potassium: 4.5 mmol/L (ref 3.5–5.3)
Sodium: 137 mmol/L (ref 135–146)
Total Bilirubin: 0.7 mg/dL (ref 0.2–1.2)
Total Protein: 8 g/dL (ref 6.1–8.1)
eGFR: 96 mL/min/{1.73_m2} (ref >=60)

## 2021-03-02 LAB — LIPID PANEL W/REFLEX DIRECT LDL
Cholesterol: 166 mg/dL (ref <200)
HDL: 47 mg/dL (ref >=40)
LDL Cholesterol (Calc): 88 mg/dL (calc)
Non-HDL Cholesterol (Calc): 119 mg/dL (calc) (ref <130)
Total CHOL/HDL Ratio: 3.5 (calc) (ref <5.0)
Triglycerides: 221 mg/dL — ABNORMAL HIGH (ref <150)

## 2021-03-02 NOTE — Progress Notes (Signed)
Robert Frey,   Liver enzymes are improving.  Kidney function looks good.  TG are MUCH better but still not to goal. Stay on vascepa(your are taking right), lipitor, and if you start back ozempic and get sugars to goal your TG should come down.  LDL not quite to goal of under 70. We tried repatha and insurance did not approve it? Are you taking this? It is on med list?

## 2021-03-03 NOTE — Progress Notes (Signed)
Robert Frey, can we look into why repatha was not approved for patient?   Gwyndolyn Saxon, let patient know we are going to re look into repatha approval. Continue to watch fried/fatty/processed foods. Try to get 30 minutes of exercise 5 days a week.   As far as ribs there was no fracture so this is a bad contusion and muscle sprain. It can take a few weeks. He has some norco that I sent for break through pain from ibuprofen 800mg  up to three times a day and tylenol 1000mg  up to three times a day. Make sure he is icing area regularly. He can use biofreeze or icy hot patches over the area. Hopefully in the next few days he should turn the corner.

## 2021-03-07 DIAGNOSIS — W19XXXA Unspecified fall, initial encounter: Secondary | ICD-10-CM | POA: Insufficient documentation

## 2021-03-07 DIAGNOSIS — R0781 Pleurodynia: Secondary | ICD-10-CM | POA: Insufficient documentation

## 2021-03-23 ENCOUNTER — Other Ambulatory Visit: Payer: Self-pay | Admitting: Physician Assistant

## 2021-03-23 DIAGNOSIS — E1129 Type 2 diabetes mellitus with other diabetic kidney complication: Secondary | ICD-10-CM

## 2021-04-11 ENCOUNTER — Telehealth: Payer: Self-pay

## 2021-04-11 NOTE — Telephone Encounter (Signed)
Medication: XIGDUO XR 02-999 MG TB24 Prior authorization determination received Medication has been approved Approval dates: 03/12/2021-04/11/2022  Patient aware via: [place] aware: Yes Provider aware via this encounter

## 2021-04-11 NOTE — Telephone Encounter (Signed)
Medication: XIGDUO XR 02-999 MG TB24 Prior authorization submitted via CoverMyMeds on 04/11/2021 PA submission pending

## 2021-05-03 ENCOUNTER — Other Ambulatory Visit: Payer: Self-pay

## 2021-05-03 ENCOUNTER — Ambulatory Visit (INDEPENDENT_AMBULATORY_CARE_PROVIDER_SITE_OTHER): Payer: Commercial Managed Care - PPO

## 2021-05-03 ENCOUNTER — Ambulatory Visit (INDEPENDENT_AMBULATORY_CARE_PROVIDER_SITE_OTHER): Payer: Commercial Managed Care - PPO | Admitting: Sports Medicine

## 2021-05-03 DIAGNOSIS — M79671 Pain in right foot: Secondary | ICD-10-CM

## 2021-05-03 DIAGNOSIS — G8929 Other chronic pain: Secondary | ICD-10-CM | POA: Diagnosis not present

## 2021-05-03 DIAGNOSIS — M25571 Pain in right ankle and joints of right foot: Secondary | ICD-10-CM

## 2021-05-03 MED ORDER — PREDNISONE 50 MG PO TABS: ORAL_TABLET | ORAL | 0 refills | Status: DC

## 2021-05-03 NOTE — Progress Notes (Signed)
    Procedures performed today:    None.  Independent interpretation of notes and tests performed by another provider:   None.  Brief History, Exam, Impression, and Recommendations:    Chronic foot pain, right This is a pleasant 60 year old male, he has a history of psoriatic arthritis, historically treated by rheumatology with Enbrel, [place]. For the past several weeks he has had severe increasing pain in his right foot, lateral midfoot predominantly at the lateral calcaneus, cuboid. There are significant psoriatic plaques overlying. I do think he is having more a flare in psoriatic arthritis versus a stress injury. And a cam boot, x-rays of the foot, 5 days of prednisone. Formal physical therapy. Return to see me in 6 weeks, MRI versus injections if no better.  Chronic process with exacerbation and pharmacologic intervention  ___________________________________________ Gwen Her. Dianah Field, M.D., ABFM., CAQSM. Primary Care and [place] Instructor of [employer] [employer]

## 2021-05-03 NOTE — Assessment & Plan Note (Signed)
This is a pleasant 60 year old male, he has a history of psoriatic arthritis, historically treated by rheumatology with Enbrel, [place]. For the past several weeks he has had severe increasing pain in his right foot, lateral midfoot predominantly at the lateral calcaneus, cuboid. There are significant psoriatic plaques overlying. I do think he is having more a flare in psoriatic arthritis versus a stress injury. And a cam boot, x-rays of the foot, 5 days of prednisone. Formal physical therapy. Return to see me in 6 weeks, MRI versus injections if no better.

## 2021-05-09 ENCOUNTER — Ambulatory Visit: Payer: Commercial Managed Care - PPO | Admitting: Physical Therapy

## 2021-05-24 ENCOUNTER — Encounter (HOSPITAL_BASED_OUTPATIENT_CLINIC_OR_DEPARTMENT_OTHER): Payer: Self-pay | Admitting: Physician Assistant

## 2021-05-31 ENCOUNTER — Ambulatory Visit: Payer: Commercial Managed Care - PPO | Admitting: Sports Medicine

## 2021-06-01 ENCOUNTER — Other Ambulatory Visit: Payer: Self-pay

## 2021-06-01 ENCOUNTER — Ambulatory Visit (HOSPITAL_BASED_OUTPATIENT_CLINIC_OR_DEPARTMENT_OTHER): Payer: Commercial Managed Care - PPO | Admitting: Internal Medicine

## 2021-06-01 ENCOUNTER — Encounter: Payer: Self-pay | Admitting: Physician Assistant

## 2021-06-01 ENCOUNTER — Ambulatory Visit (INDEPENDENT_AMBULATORY_CARE_PROVIDER_SITE_OTHER): Payer: Commercial Managed Care - PPO | Admitting: Physician Assistant

## 2021-06-01 VITALS — BP 149/74 | HR 102 | Ht 72.0 in | Wt 225.0 lb

## 2021-06-01 DIAGNOSIS — Z794 Long term (current) use of insulin: Secondary | ICD-10-CM | POA: Diagnosis not present

## 2021-06-01 DIAGNOSIS — M450 Ankylosing spondylitis of multiple sites in spine: Secondary | ICD-10-CM | POA: Diagnosis not present

## 2021-06-01 DIAGNOSIS — M545 Low back pain, unspecified: Secondary | ICD-10-CM

## 2021-06-01 DIAGNOSIS — E1342 Other specified diabetes mellitus with diabetic polyneuropathy: Secondary | ICD-10-CM | POA: Diagnosis not present

## 2021-06-01 DIAGNOSIS — E1129 Type 2 diabetes mellitus with other diabetic kidney complication: Secondary | ICD-10-CM

## 2021-06-01 DIAGNOSIS — R809 Proteinuria, unspecified: Secondary | ICD-10-CM | POA: Diagnosis not present

## 2021-06-01 LAB — POCT GLYCOSYLATED HEMOGLOBIN (HGB A1C): Hemoglobin A1C: 7.1 % — AB (ref 4.0–5.6)

## 2021-06-01 MED ORDER — SEMAGLUTIDE (2 MG/DOSE) 8 MG/3ML ~~LOC~~ SOPN: 2.0000 mg | PEN_INJECTOR | SUBCUTANEOUS | 0 refills | Status: DC

## 2021-06-01 MED ORDER — XIGDUO XR 10-1000 MG PO TB24: 1.0000 | ORAL_TABLET | Freq: Every day | ORAL | 0 refills | Status: DC

## 2021-06-01 MED ORDER — PREGABALIN 75 MG PO CAPS: 75.0000 mg | ORAL_CAPSULE | Freq: Two times a day (BID) | ORAL | 1 refills | Status: DC

## 2021-06-01 NOTE — Progress Notes (Signed)
Subjective:    Patient ID: Robert Frey, male    DOB: 14-Mar-1961, 61 y.o.   MRN: [id number]  HPI Pt is a 61 yo male with T2DM, HTN, HLD, neuropathy who presents to the clinic for 3 month follow up.   He is checking sugars and running about 135 in am. Taking ozempic 1mg  weekly, xigduo and toujeo at bedtime 28 units. No hypoglycemic events. No open sores or wounds. No CP, palpitations, or headaches.   Pt continues to have lots of pain. He feels like neuropathy is worsening as well.   .. Active Ambulatory Problems    Diagnosis Date Noted   HYPERCHOLESTEROLEMIA 03/06/2006   OBESITY, NOS 03/06/2006   OBSESSIVE COMPUL. DISORDER 03/06/2006   Ankylosing spondylitis of multiple sites in spine ([place]) 09/30/2009   Psoriatic arthritis ([place]) 10/11/2012   Type 2 diabetes mellitus with microalbuminuria, with long-term current use of insulin ([place]) 10/11/2012   Heel spur 01/20/2014   Osteoarthritis of foot, left 01/20/2014   Lung nodule < 6cm on CT 05/15/2014   Essential hypertension 06/08/2015   Microalbuminuria 06/08/2015   Bruising 06/09/2015   Lumbosacral strain 01/17/2017   Dark urine 01/01/2019   Shakes 04/04/2019   Essential tremor 04/16/2019   Shoulder separation, right, subsequent encounter 06/17/2019   Primary osteoarthritis, right glenohumeral joint 07/02/2019   Current smoker 10/22/2019   Nosebleed 10/22/2019   Diabetic neuropathy ([place]) 01/28/2020   Family history of Alzheimer's disease 11/09/2020   Rib pain on left side 03/07/2021   Fall 03/07/2021   Chronic foot pain, right 05/03/2021   Acute left-sided low back pain without sciatica 06/07/2021   Resolved Ambulatory Problems    Diagnosis Date Noted   KNEE PAIN, BILATERAL 12/28/2006   ELEVATED BLOOD PRESSURE WITHOUT DIAGNOSIS OF HYPERTENSION 08/29/2006   Cough 11/25/2010   Past Medical History:  Diagnosis Date   Ankylosing spondylitis ([place])    COPD (chronic obstructive pulmonary disease) ([place])    Diabetes mellitus  without complication ([place])    High cholesterol    Hypertension       Review of Systems See HPI.     Objective:   Physical Exam Vitals reviewed.  Constitutional:      Appearance: Normal appearance.  HENT:     Head: Normocephalic.  Cardiovascular:     Rate and Rhythm: Normal rate and regular rhythm.     Pulses: Normal pulses.  Pulmonary:     Effort: Pulmonary effort is normal.     Breath sounds: Normal breath sounds.  Neurological:     General: No focal deficit present.     Mental Status: He is alert and oriented to person, place, and time.  Psychiatric:        Mood and Affect: Mood normal.         .. Results for orders placed or performed in visit on 06/01/21  POCT glycosylated hemoglobin (Hb A1C)  Result Value Ref Range   Hemoglobin A1C 7.1 (A) 4.0 - 5.6 %   HbA1c POC (<> result, manual entry)     HbA1c, POC (prediabetic range)     HbA1c, POC (controlled diabetic range)      Assessment & Plan:  Marland KitchenMarland KitchenTzion was seen today for diabetes.  Diagnoses and all orders for this visit:  Type 2 diabetes mellitus with microalbuminuria, with long-term current use of insulin ([name]) -     POCT glycosylated hemoglobin (Hb A1C) -     Dapagliflozin-metFORMIN HCl ER (XIGDUO XR) 02-999 MG TB24; Take 1 tablet  by mouth daily. -     Semaglutide, 2 MG/DOSE, 8 MG/3ML SOPN; Inject 2 mg as directed once a week.  Acute left-sided low back pain without sciatica -     pregabalin (LYRICA) 75 MG capsule; Take 1 capsule (75 mg total) by mouth 2 (two) times daily.  Ankylosing spondylitis of multiple sites in spine ([name]) -     pregabalin (LYRICA) 75 MG capsule; Take 1 capsule (75 mg total) by mouth 2 (two) times daily.  Diabetic polyneuropathy associated with other specified diabetes mellitus ([place]) -     pregabalin (LYRICA) 75 MG capsule; Take 1 capsule (75 mg total) by mouth 2 (two) times daily.   A1C no to goal Increased ozempic to 2mg  weekly Continue xigduo BP not to goal on ARB.  Discussed weight loss and salt reduction to get to goal On statin Foot and eye exam UTD Covid/flu/pneumonia/shingles UTD.  Follow up in 3 months.   Start lyrica for chronic pain and neuropathy.

## 2021-06-01 NOTE — Patient Instructions (Signed)
Increased ozempic 2mg  weekly Lyrica 75mg  twice a day

## 2021-06-04 ENCOUNTER — Telehealth: Payer: Self-pay

## 2021-06-04 NOTE — Telephone Encounter (Signed)
Medication: VASCEPA 1 g capsule Prior authorization determination received Medication has been approved Approval dates: 05/05/2021-06/04/2022  Patient aware via: [place] aware: Yes Provider aware via this encounter

## 2021-06-04 NOTE — Telephone Encounter (Signed)
Medication: VASCEPA 1 g capsule Prior authorization submitted via CoverMyMeds on 06/04/2021 PA submission pending

## 2021-06-07 ENCOUNTER — Encounter: Payer: Self-pay | Admitting: Physician Assistant

## 2021-06-07 DIAGNOSIS — M545 Low back pain, unspecified: Secondary | ICD-10-CM | POA: Insufficient documentation

## 2021-06-10 ENCOUNTER — Other Ambulatory Visit: Payer: Self-pay | Admitting: Physician Assistant

## 2021-06-10 DIAGNOSIS — R413 Other amnesia: Secondary | ICD-10-CM

## 2021-06-10 DIAGNOSIS — I10 Essential (primary) hypertension: Secondary | ICD-10-CM

## 2021-06-10 MED ORDER — OLMESARTAN MEDOXOMIL 20 MG PO TABS: 20.0000 mg | ORAL_TABLET | Freq: Every day | ORAL | 1 refills | Status: DC

## 2021-06-10 NOTE — Addendum Note (Signed)
Addended by: Burton Apley A on: 06/10/2021 08:58 AM   Modules accepted: Orders

## 2021-06-14 ENCOUNTER — Ambulatory Visit (INDEPENDENT_AMBULATORY_CARE_PROVIDER_SITE_OTHER): Payer: Commercial Managed Care - PPO | Admitting: Physician Assistant

## 2021-06-14 ENCOUNTER — Other Ambulatory Visit: Payer: Self-pay

## 2021-06-14 ENCOUNTER — Encounter: Payer: Self-pay | Admitting: Physician Assistant

## 2021-06-14 VITALS — BP 175/74 | HR 113 | Temp 98.6°F | Ht 72.0 in | Wt 225.0 lb

## 2021-06-14 DIAGNOSIS — L089 Local infection of the skin and subcutaneous tissue, unspecified: Secondary | ICD-10-CM

## 2021-06-14 DIAGNOSIS — E11628 Type 2 diabetes mellitus with other skin complications: Secondary | ICD-10-CM | POA: Diagnosis not present

## 2021-06-14 MED ORDER — DOXYCYCLINE HYCLATE 100 MG PO TABS: 100.0000 mg | ORAL_TABLET | Freq: Two times a day (BID) | ORAL | 0 refills | Status: DC

## 2021-06-14 NOTE — Patient Instructions (Addendum)
Every 24 hours wash with soap and water and put antibiotic ointment on for 7 days.  Start doxycycline for 10 days.   Wound Infection A wound infection happens when tiny organisms (microorganisms) start to grow in a wound. A wound infection is most often caused by bacteria. Infection can cause the wound to break open or worsen. Wound infection needs treatment. If a wound infection is left untreated, complications can occur. Untreated wound infections may lead to an infection in the bloodstream (septicemia) or a bone infection (osteomyelitis). What are the causes? This condition is most often caused by bacteria growing in a wound. Other microorganisms, like yeast and fungi, can also cause wound infections. What increases the risk? The following factors may make you more likely to develop this condition: Having a weak body defense system (immune system). Having diabetes. Taking steroid medicines for a long time (chronic use). Smoking. Being an older person. Being overweight. Taking chemotherapy medicines. What are the signs or symptoms? Symptoms of this condition include: Having more redness, swelling, or pain at the wound site. Having more blood or fluid at the wound site. A bad smell coming from a wound or bandage (dressing). Having a fever. Feeling tired or fatigued. Having warmth at or around the wound. Having pus at the wound site. How is this diagnosed? This condition is diagnosed with a medical history and physical exam. You may also have a wound culture or blood tests or both. How is this treated? This condition is usually treated with an antibiotic medicine. The infection should improve 24-48 hours after you start antibiotics. After 24-48 hours, redness around the wound should stop spreading, and the wound should be less painful. Follow these instructions at home: Medicines Take or apply over-the-counter and prescription medicines only as told by your health care provider. If  you were prescribed an antibiotic medicine, take or apply it as told by your health care provider. Do not stop using the antibiotic even if you start to feel better. Wound care  Clean the wound each day, or as told by your health care provider. Wash the wound with mild soap and water. Rinse the wound with water to remove all soap. Pat the wound dry with a clean towel. Do not rub it. Follow instructions from your health care provider about how to take care of your wound. Make sure you: Wash your hands with soap and water before and after you change your dressing. If soap and water are not available, use hand sanitizer. Change your dressing as told by your health care provider. Leave stitches (sutures), skin glue, or adhesive strips in place if your wound has been closed. These skin closures may need to stay in place for 2 weeks or longer. If adhesive strip edges start to loosen and curl up, you may trim the loose edges. Do not remove adhesive strips completely unless your health care provider tells you to do that. Some wounds are left open to heal on their own. Check your wound every day for signs of infection. Watch for: More redness, swelling, or pain. More fluid or blood. Warmth. Pus or a bad smell. General instructions Keep the dressing dry until your health care provider says it can be removed. Do not take baths, swim, or use a hot tub until your health care provider approves. Ask your health care provider if you may take showers. You may only be allowed to take sponge baths. Raise (elevate) the injured area above the level of your heart while  you are sitting or lying down. Do not scratch or pick at the wound. Keep all follow-up visits as told by your health care provider. This is important. Contact a health care provider if: Your pain is not controlled with medicine. You have more redness, swelling, or pain around your wound. You have more fluid or blood coming from your wound. Your  wound feels warm to the touch. You have pus coming from your wound. You continue to notice a bad smell coming from your wound or your dressing. Your wound that was closed breaks open. Get help right away if: You have a red streak going away from your wound. You have a fever. Summary A wound infection happens when tiny organisms (microorganisms) start to grow in a wound. This condition is usually treated with an antibiotic medicine. Follow instructions from your health care provider about how to take care of your wound. Contact a health care provider if your wound infection does not begin to improve in 24-48 hours, or your symptoms worsen. Keep all follow-up visits as told by your health care provider. This is important. This information is not intended to replace advice given to you by your health care provider. Make sure you discuss any questions you have with your health care provider. Document Revised: 12/25/2017 Document Reviewed: 12/25/2017 Elsevier Patient Education  [place].

## 2021-06-14 NOTE — Progress Notes (Signed)
° °  Subjective:    Patient ID: Robert Frey, male    DOB: 17-Jul-1960, 61 y.o.   MRN: [id number]  HPI Pt is a 61 yo obese male with T2DM who presents to the clinic with wound over his right achilles tendont hat he noticed this morning. He does not have a lot of pain associated because of his neuropathy. He noticed some swelling that wraps around into foot and some warmth and redness. He has not done anything to it. He does not remember any trauma or injury to this area. He does check his feet often to look for wounds.  No fever, chills, nausea, vomiting, diarrhea.    .. Active Ambulatory Problems    Diagnosis Date Noted   HYPERCHOLESTEROLEMIA 03/06/2006   OBESITY, NOS 03/06/2006   OBSESSIVE COMPUL. DISORDER 03/06/2006   Ankylosing spondylitis of multiple sites in spine ([place]) 09/30/2009   Psoriatic arthritis ([place]) 10/11/2012   Type 2 diabetes mellitus with microalbuminuria, with long-term current use of insulin ([place]) 10/11/2012   Heel spur 01/20/2014   Osteoarthritis of foot, left 01/20/2014   Lung nodule < 6cm on CT 05/15/2014   Essential hypertension 06/08/2015   Microalbuminuria 06/08/2015   Bruising 06/09/2015   Lumbosacral strain 01/17/2017   Dark urine 01/01/2019   Shakes 04/04/2019   Essential tremor 04/16/2019   Shoulder separation, right, subsequent encounter 06/17/2019   Primary osteoarthritis, right glenohumeral joint 07/02/2019   Current smoker 10/22/2019   Nosebleed 10/22/2019   Diabetic neuropathy ([place]) 01/28/2020   Family history of Alzheimer's disease 11/09/2020   Rib pain on left side 03/07/2021   Fall 03/07/2021   Chronic foot pain, right 05/03/2021   Acute left-sided low back pain without sciatica 06/07/2021   Resolved Ambulatory Problems    Diagnosis Date Noted   KNEE PAIN, BILATERAL 12/28/2006   ELEVATED BLOOD PRESSURE WITHOUT DIAGNOSIS OF HYPERTENSION 08/29/2006   Cough 11/25/2010   Past Medical History:  Diagnosis Date   Ankylosing spondylitis ([place])     COPD (chronic obstructive pulmonary disease) ([place])    Diabetes mellitus without complication ([place])    High cholesterol    Hypertension         Review of Systems See HPI.     Objective:   Physical Exam  Over right achilles insertion superficial wound just through first layer of skin with irregular border no active discharge approximately 1.5cm by 1cm. Very warm with erythema and swelling  that extends into dorsal foot.   Cleaned wound with hibclens Placed xeroform over area and placed a non stick bandage over gauze with coban for stabilization.     Assessment & Plan:  Marland KitchenMarland KitchenYobani was seen today for foot problem.  Diagnoses and all orders for this visit:  Diabetic foot infection ([place]) -     doxycycline (VIBRA-TABS) 100 MG tablet; Take 1 tablet (100 mg total) by mouth 2 (two) times daily.   Discussed how to clean wound daily and keep covered.  Start doxycycline.  Follow up in 1 week.  Follow up sooner with worsening symptoms.

## 2021-06-17 ENCOUNTER — Encounter: Payer: Self-pay | Admitting: Internal Medicine

## 2021-06-21 ENCOUNTER — Other Ambulatory Visit: Payer: Self-pay

## 2021-06-21 ENCOUNTER — Ambulatory Visit (INDEPENDENT_AMBULATORY_CARE_PROVIDER_SITE_OTHER): Payer: Commercial Managed Care - PPO | Admitting: Physician Assistant

## 2021-06-21 ENCOUNTER — Encounter: Payer: Self-pay | Admitting: Physician Assistant

## 2021-06-21 VITALS — BP 155/75 | HR 96 | Temp 98.0°F

## 2021-06-21 DIAGNOSIS — E11628 Type 2 diabetes mellitus with other skin complications: Secondary | ICD-10-CM | POA: Diagnosis not present

## 2021-06-21 DIAGNOSIS — L089 Local infection of the skin and subcutaneous tissue, unspecified: Secondary | ICD-10-CM | POA: Diagnosis not present

## 2021-06-21 NOTE — Patient Instructions (Signed)
Change duoderm every 3 days recheck 1 week.

## 2021-06-21 NOTE — Progress Notes (Signed)
° °  Subjective:    Patient ID: Robert Frey, male    DOB: 1960/12/10, 61 y.o.   MRN: [id number]  HPI Pt is a 61 yo male with T2DM who presents to the clinic to follow up of diabetic infection wound of right heel. Pt has 3 days left of doxycycline. No fever, chills. Wound seems to be healing some but not making huge improvement. Continues to but bactroban over wound daily.     Review of Systems See HPI.     Objective:   Physical Exam  Over right achilles insertion superficial wound just through first layer of skin with more regular border and new skin growing around circumference. No active discharge. Center ulcer decreased to 1cm by .5cm with surrounding 2cm of erythema. No swelling or warmth or redness extending into foot now.       Assessment & Plan:  Marland KitchenMarland KitchenMitchelle was seen today for foot problem.  Diagnoses and all orders for this visit:  Diabetic foot infection ([place])   Improving some. New skin is forming around circumference. Cleaned with hibiclens and placed duoderm over wound. Change every 3 days and follow up in 1 week. Finish doxycycline. Supplies given.

## 2021-06-28 ENCOUNTER — Other Ambulatory Visit: Payer: Self-pay

## 2021-06-28 ENCOUNTER — Ambulatory Visit (INDEPENDENT_AMBULATORY_CARE_PROVIDER_SITE_OTHER): Payer: Commercial Managed Care - PPO | Admitting: Physician Assistant

## 2021-06-28 ENCOUNTER — Encounter: Payer: Self-pay | Admitting: Physician Assistant

## 2021-06-28 VITALS — BP 121/48 | HR 93 | Ht 71.0 in | Wt 223.0 lb

## 2021-06-28 DIAGNOSIS — Z794 Long term (current) use of insulin: Secondary | ICD-10-CM | POA: Diagnosis not present

## 2021-06-28 DIAGNOSIS — R809 Proteinuria, unspecified: Secondary | ICD-10-CM | POA: Diagnosis not present

## 2021-06-28 DIAGNOSIS — E1129 Type 2 diabetes mellitus with other diabetic kidney complication: Secondary | ICD-10-CM | POA: Diagnosis not present

## 2021-06-28 DIAGNOSIS — L409 Psoriasis, unspecified: Secondary | ICD-10-CM

## 2021-06-28 DIAGNOSIS — L089 Local infection of the skin and subcutaneous tissue, unspecified: Secondary | ICD-10-CM

## 2021-06-28 DIAGNOSIS — E11628 Type 2 diabetes mellitus with other skin complications: Secondary | ICD-10-CM

## 2021-06-28 LAB — POCT GLYCOSYLATED HEMOGLOBIN (HGB A1C): HbA1c, POC (controlled diabetic range): 7.4 % — AB (ref 0.0–7.0)

## 2021-06-28 MED ORDER — OTEZLA 30 MG PO TABS: 30.0000 mg | ORAL_TABLET | Freq: Two times a day (BID) | ORAL | 5 refills | Status: DC

## 2021-06-28 MED ORDER — TOUJEO MAX SOLOSTAR 300 UNIT/ML ~~LOC~~ SOPN: PEN_INJECTOR | SUBCUTANEOUS | 0 refills | Status: DC

## 2021-06-28 MED ORDER — TRIAMCINOLONE ACETONIDE 0.5 % EX CREA: TOPICAL_CREAM | Freq: Two times a day (BID) | CUTANEOUS | 1 refills | Status: AC

## 2021-06-28 MED ORDER — OTEZLA 10 & 20 & 30 MG PO TBPK: ORAL_TABLET | ORAL | 0 refills | Status: DC

## 2021-06-28 NOTE — Patient Instructions (Addendum)
Otezla for psoriasis.

## 2021-06-28 NOTE — Progress Notes (Signed)
Subjective:     Patient ID: Robert Frey, male   DOB: [birth year], 61 y.o.   MRN: [id number]  HPI 61 YO male presents to the clinic to follow up on right diabetic foot wound. He finished the doxycycline and changed duoderm every 3 days. He denies any pain, redness, or drainage from the wound. He denies any new wounds.   Patient wants to try medications for his psoriasis. He reports its is on his foot and up his leg. He reports itching of the plaques. His symptoms get better in the summer.   Needs refill on toujeo.   .. Active Ambulatory Problems    Diagnosis Date Noted   HYPERCHOLESTEROLEMIA 03/06/2006   OBESITY, NOS 03/06/2006   OBSESSIVE COMPUL. DISORDER 03/06/2006   Ankylosing spondylitis of multiple sites in spine ([place]) 09/30/2009   Psoriatic arthritis ([place]) 10/11/2012   Type 2 diabetes mellitus with microalbuminuria, with long-term current use of insulin ([place]) 10/11/2012   Heel spur 01/20/2014   Osteoarthritis of foot, left 01/20/2014   Lung nodule < 6cm on CT 05/15/2014   Essential hypertension 06/08/2015   Microalbuminuria 06/08/2015   Bruising 06/09/2015   Lumbosacral strain 01/17/2017   Dark urine 01/01/2019   Shakes 04/04/2019   Essential tremor 04/16/2019   Shoulder separation, right, subsequent encounter 06/17/2019   Primary osteoarthritis, right glenohumeral joint 07/02/2019   Current smoker 10/22/2019   Nosebleed 10/22/2019   Diabetic neuropathy ([place]) 01/28/2020   Family history of Alzheimer's disease 11/09/2020   Rib pain on left side 03/07/2021   Fall 03/07/2021   Chronic foot pain, right 05/03/2021   Acute left-sided low back pain without sciatica 06/07/2021   Diabetic foot infection ([place]) 06/28/2021   Psoriasis 06/28/2021   Resolved Ambulatory Problems    Diagnosis Date Noted   KNEE PAIN, BILATERAL 12/28/2006   ELEVATED BLOOD PRESSURE WITHOUT DIAGNOSIS OF HYPERTENSION 08/29/2006   Cough 11/25/2010   Past Medical History:  Diagnosis Date    Ankylosing spondylitis ([place])    COPD (chronic obstructive pulmonary disease) ([place])    Diabetes mellitus without complication ([place])    High cholesterol    Hypertension      Review of Systems  Constitutional:  Negative for chills and fever.  Skin:  Positive for wound.       Psoriasis      Objective:   Physical Exam Constitutional:      General: He is not in acute distress.    Appearance: Normal appearance. He is obese. He is not ill-appearing, toxic-appearing or diaphoretic.  HENT:     Head: Normocephalic and atraumatic.  Eyes:     Conjunctiva/sclera: Conjunctivae normal.  Musculoskeletal:        General: Normal range of motion.     Cervical back: Normal range of motion and neck supple.  Skin:    General: Skin is warm and dry.     Coloration: Skin is ashen.     Findings: Rash present. Rash is scaling.     Comments: Psoriasis rash on right foot up leg.   Well healing 2 cm x 1 cm wound on right heel, new skin forming. No surrounding erythema, no discharge.  Neurological:     Mental Status: He is alert and oriented to person, place, and time.  Psychiatric:        Mood and Affect: Mood normal.        Behavior: Behavior normal.        Thought Content: Thought content normal.  Judgment: Judgment normal.       Assessment:     Marland KitchenMarland KitchenJordon was seen today for follow-up.  Diagnoses and all orders for this visit:  Diabetic foot infection ([place])  Type 2 diabetes mellitus with microalbuminuria, with long-term current use of insulin ([name]) -     insulin glargine, 2 Unit Dial, (TOUJEO MAX SOLOSTAR) 300 UNIT/ML Solostar Pen; INJECT 25 UNITS AT BEDTIME. INCREASE BY 2 UNITS DAILY UNTIL FASTING SUGARS ARE BETWEEN 90-130 -     POCT HgB A1C  Psoriasis -     triamcinolone 0.5%-Eucerin equivalent 1:1 cream mixture; Apply topically 2 (two) times daily. Mix 0.5% triamcinolone cream and Eucerin in a one-to-one mixture. -     Apremilast (OTEZLA) 10 & 20 & 30 MG TBPK; 10 mg p.o. day 1, then  twice daily day 2, then 10 mg in the morning and 20 mg in the evening day 3, then 20 mg twice daily day 4, then 20 mg in the morning and 30 mg in the evening day 5, then 30 mg twice daily. -     Apremilast (OTEZLA) 30 MG TABS; Take 1 tablet (30 mg total) by mouth in the morning and at bedtime.       Plan:     Foot wound is well healing, continue to keep covered for protection.   Start triamcinolone for severe patches of psoriasis. Start otezla daily for psoriasis control.   Keep DM follow up every 3 months.

## 2021-06-28 NOTE — Progress Notes (Signed)
Ok to leave A!C in but how to we make sure he is not charged for this? You may need to ask someone.

## 2021-07-07 ENCOUNTER — Telehealth: Payer: Self-pay | Admitting: Neurology

## 2021-07-07 MED ORDER — ICOSAPENT ETHYL 1 G PO CAPS: 2.0000 g | ORAL_CAPSULE | Freq: Two times a day (BID) | ORAL | 11 refills | Status: DC

## 2021-07-07 NOTE — Telephone Encounter (Signed)
Per PA for Vascepa, needs changed to generic. Generic RX sent to pharmacy.

## 2021-07-12 ENCOUNTER — Encounter: Payer: Self-pay | Admitting: Physician Assistant

## 2021-07-12 ENCOUNTER — Ambulatory Visit (INDEPENDENT_AMBULATORY_CARE_PROVIDER_SITE_OTHER): Payer: Commercial Managed Care - PPO | Admitting: Physician Assistant

## 2021-07-12 ENCOUNTER — Other Ambulatory Visit: Payer: Self-pay

## 2021-07-12 VITALS — BP 133/71 | HR 102 | Ht 71.0 in | Wt 221.0 lb

## 2021-07-12 DIAGNOSIS — M25571 Pain in right ankle and joints of right foot: Secondary | ICD-10-CM

## 2021-07-12 DIAGNOSIS — L4050 Arthropathic psoriasis, unspecified: Secondary | ICD-10-CM | POA: Diagnosis not present

## 2021-07-12 DIAGNOSIS — R0989 Other specified symptoms and signs involving the circulatory and respiratory systems: Secondary | ICD-10-CM

## 2021-07-12 DIAGNOSIS — Z794 Long term (current) use of insulin: Secondary | ICD-10-CM

## 2021-07-12 DIAGNOSIS — R2241 Localized swelling, mass and lump, right lower limb: Secondary | ICD-10-CM

## 2021-07-12 DIAGNOSIS — R809 Proteinuria, unspecified: Secondary | ICD-10-CM

## 2021-07-12 DIAGNOSIS — E1129 Type 2 diabetes mellitus with other diabetic kidney complication: Secondary | ICD-10-CM

## 2021-07-12 DIAGNOSIS — E7800 Pure hypercholesterolemia, unspecified: Secondary | ICD-10-CM

## 2021-07-12 MED ORDER — PREDNISONE 50 MG PO TABS: ORAL_TABLET | ORAL | 0 refills | Status: DC

## 2021-07-12 NOTE — Patient Instructions (Addendum)
Will get ABI for right leg.  Start prednisone for 5 days.  Will make referral for rheumatology.

## 2021-07-13 ENCOUNTER — Encounter: Payer: Self-pay | Admitting: Physician Assistant

## 2021-07-13 DIAGNOSIS — M25571 Pain in right ankle and joints of right foot: Secondary | ICD-10-CM | POA: Insufficient documentation

## 2021-07-13 DIAGNOSIS — R0989 Other specified symptoms and signs involving the circulatory and respiratory systems: Secondary | ICD-10-CM | POA: Insufficient documentation

## 2021-07-13 NOTE — Progress Notes (Signed)
Subjective:    Patient ID: Robert Frey, male    DOB: 1960/08/10, 61 y.o.   MRN: [id number]  HPI Pt presents to the clinic with pain and swelling of right lateral foot for past 8 days that continues to get worse. He had similar issue in December and saw Dr. Darene Frey for this. No injury. He does walk a lot at work. He has psoriatic arthritis but not on anything right now. Needs a rheumatologist. No fever, chills, body aches, SOB.   His right heel is healing nicely and no pain associated.   .. Active Ambulatory Problems    Diagnosis Date Noted   HYPERCHOLESTEROLEMIA 03/06/2006   OBESITY, NOS 03/06/2006   OBSESSIVE COMPUL. DISORDER 03/06/2006   Ankylosing spondylitis of multiple sites in spine ([place]) 09/30/2009   Psoriatic arthritis ([place]) 10/11/2012   Type 2 diabetes mellitus with microalbuminuria, with long-term current use of insulin ([place]) 10/11/2012   Heel spur 01/20/2014   Osteoarthritis of foot, left 01/20/2014   Lung nodule < 6cm on CT 05/15/2014   Essential hypertension 06/08/2015   Microalbuminuria 06/08/2015   Bruising 06/09/2015   Lumbosacral strain 01/17/2017   Dark urine 01/01/2019   Shakes 04/04/2019   Essential tremor 04/16/2019   Shoulder separation, right, subsequent encounter 06/17/2019   Primary osteoarthritis, right glenohumeral joint 07/02/2019   Current smoker 10/22/2019   Nosebleed 10/22/2019   Diabetic neuropathy ([place]) 01/28/2020   Family history of Alzheimer's disease 11/09/2020   Rib pain on left side 03/07/2021   Fall 03/07/2021   Chronic foot pain, right 05/03/2021   Acute left-sided low back pain without sciatica 06/07/2021   Diabetic foot infection ([place]) 06/28/2021   Psoriasis 06/28/2021   Absent pedal pulses 07/13/2021   Acute right ankle pain 07/13/2021   Resolved Ambulatory Problems    Diagnosis Date Noted   KNEE PAIN, BILATERAL 12/28/2006   ELEVATED BLOOD PRESSURE WITHOUT DIAGNOSIS OF HYPERTENSION 08/29/2006   Cough 11/25/2010   Past Medical  History:  Diagnosis Date   Ankylosing spondylitis ([place])    COPD (chronic obstructive pulmonary disease) ([place])    Diabetes mellitus without complication ([place])    High cholesterol    Hypertension      Review of Systems See HPI.     Objective:   Physical Exam  Right foot:  Swelling lateral ankle into dorsal foot, non-pitting edema Purple color to lateral ankle into foot Tender around lateral malleolus and into cubiod and talus  Slightly warm to touch ROM of ankle normal      Assessment & Plan:  Marland KitchenMarland KitchenKamarri was seen today for joint swelling.  Diagnoses and all orders for this visit:  Acute right ankle pain -     predniSONE (DELTASONE) 50 MG tablet; One tab PO daily for 5 days. -     VAS [place] ABI WITH/WO TBI; Future  Psoriatic arthritis ([name]) -     predniSONE (DELTASONE) 50 MG tablet; One tab PO daily for 5 days.  Absent pedal pulses -     VAS [place] ABI WITH/WO TBI; Future  Localized swelling of right foot -     VAS [place] ABI WITH/WO TBI; Future  Type 2 diabetes mellitus with microalbuminuria, with long-term current use of insulin ([name]) -     VAS [place] ABI WITH/WO TBI; Future  HYPERCHOLESTEROLEMIA -     VAS [place] ABI WITH/WO TBI; Future   Pt had a recent episode of very similar symptoms and saw Dr. Darene Frey. Symptoms cleared with prednisone suspected psoriatic arthritis  flare. He is not on any medications for prevention of PA flares. He is on otezla. Will treat with prednisone again.  Referral placed for rheumatology. I am concerned that I cannot feel a pulse today in right foot. He is a T2DM with HTN and HLD. Will get ABIs.  Follow up if symptoms not improving or worsening.

## 2021-07-18 ENCOUNTER — Other Ambulatory Visit: Payer: Self-pay | Admitting: Neurology

## 2021-07-18 DIAGNOSIS — L409 Psoriasis, unspecified: Secondary | ICD-10-CM

## 2021-07-18 MED ORDER — OTEZLA 10 & 20 & 30 MG PO TBPK: ORAL_TABLET | ORAL | 0 refills | Status: DC

## 2021-07-18 MED ORDER — OTEZLA 30 MG PO TABS: 30.0000 mg | ORAL_TABLET | Freq: Two times a day (BID) | ORAL | 5 refills | Status: DC

## 2021-07-25 ENCOUNTER — Other Ambulatory Visit: Payer: Self-pay

## 2021-07-25 ENCOUNTER — Emergency Department (HOSPITAL_BASED_OUTPATIENT_CLINIC_OR_DEPARTMENT_OTHER)
Admission: EM | Admit: 2021-07-25 | Discharge: 2021-07-25 | Disposition: A | Discharge status: Home / Self Care | Payer: Commercial Managed Care - PPO | Attending: Emergency Medicine | Admitting: Emergency Medicine

## 2021-07-25 ENCOUNTER — Encounter (HOSPITAL_BASED_OUTPATIENT_CLINIC_OR_DEPARTMENT_OTHER): Payer: Self-pay

## 2021-07-25 DIAGNOSIS — M79606 Pain in leg, unspecified: Secondary | ICD-10-CM | POA: Insufficient documentation

## 2021-07-25 DIAGNOSIS — M25579 Pain in unspecified ankle and joints of unspecified foot: Secondary | ICD-10-CM | POA: Insufficient documentation

## 2021-07-25 DIAGNOSIS — Z79899 Other long term (current) drug therapy: Secondary | ICD-10-CM | POA: Insufficient documentation

## 2021-07-25 NOTE — ED Triage Notes (Signed)
Onset over two weeks of ankle and leg pain.  No pain in calf.  Seen by primary MD two weeks ago for same.  Getting worse today.  Noted swelling from ankle to calf.  Pain to ankle.  Pedal pulse present.

## 2021-07-25 NOTE — ED Notes (Signed)
Per Registration, Patient left Waiting Area on Own [facility]. Patient to be discharged accordingly.

## 2021-07-26 ENCOUNTER — Other Ambulatory Visit: Payer: Self-pay | Admitting: Physician Assistant

## 2021-07-26 ENCOUNTER — Ambulatory Visit (INDEPENDENT_AMBULATORY_CARE_PROVIDER_SITE_OTHER): Payer: Commercial Managed Care - PPO

## 2021-07-26 ENCOUNTER — Telehealth: Payer: Self-pay

## 2021-07-26 ENCOUNTER — Ambulatory Visit (INDEPENDENT_AMBULATORY_CARE_PROVIDER_SITE_OTHER): Payer: Commercial Managed Care - PPO | Admitting: Physician Assistant

## 2021-07-26 ENCOUNTER — Encounter: Payer: Self-pay | Admitting: Physician Assistant

## 2021-07-26 VITALS — BP 152/77 | HR 96 | Ht 71.0 in | Wt 223.0 lb

## 2021-07-26 DIAGNOSIS — E1129 Type 2 diabetes mellitus with other diabetic kidney complication: Secondary | ICD-10-CM

## 2021-07-26 DIAGNOSIS — M79604 Pain in right leg: Secondary | ICD-10-CM

## 2021-07-26 DIAGNOSIS — R0989 Other specified symptoms and signs involving the circulatory and respiratory systems: Secondary | ICD-10-CM | POA: Diagnosis not present

## 2021-07-26 DIAGNOSIS — R600 Localized edema: Secondary | ICD-10-CM

## 2021-07-26 DIAGNOSIS — Z8249 Family history of ischemic heart disease and other diseases of the circulatory system: Secondary | ICD-10-CM

## 2021-07-26 MED ORDER — FUROSEMIDE 20 MG PO TABS: 20.0000 mg | ORAL_TABLET | Freq: Every day | ORAL | 0 refills | Status: DC

## 2021-07-26 NOTE — Progress Notes (Signed)
GREAT news no DVT. Still no compression until ABI. Start ASA 81mg  and lasix 20mg  a day for swelling. If swelling resolved can stop lasix. Keep keep elevated when you can.

## 2021-07-26 NOTE — Telephone Encounter (Signed)
Initiated Prior authorization [id number] 30MG  tablets Via: Covermymeds Case/Key: [id number] Status: Pending as of 07/26/21 Reason: Notified Pt via: Mychart

## 2021-07-26 NOTE — Patient Instructions (Signed)
Start ASA 81mg  Will get venous doppler Start lasix for the next 5 days

## 2021-07-26 NOTE — Progress Notes (Signed)
Subjective:    Patient ID: Robert Frey, male    DOB: Oct 04, 1960, 61 y.o.   MRN: [id number]  HPI Pt is a 61 yo obese male with T2DM with diabetic neuropathy,  HTN, HLD, and psoriatic arthritis who presents to the clinic with right ankle and leg swelling and pain for the last 2 weeks.  His wife accompanies him today. They went to ED last night and waited 6 hours and never seen so they left. He was treated with prednisone on 2/14 and helped for a few days and then the swelling came right back. Wife is concerned about a blood clot due to his family history. He was called about ABI but has not scheduled. No SOB or cough. No recent injury.  .. Active Ambulatory Problems    Diagnosis Date Noted   HYPERCHOLESTEROLEMIA 03/06/2006   OBESITY, NOS 03/06/2006   OBSESSIVE COMPUL. DISORDER 03/06/2006   Ankylosing spondylitis of multiple sites in spine ([place]) 09/30/2009   Psoriatic arthritis ([place]) 10/11/2012   Type 2 diabetes mellitus with microalbuminuria, with long-term current use of insulin ([place]) 10/11/2012   Heel spur 01/20/2014   Osteoarthritis of foot, left 01/20/2014   Lung nodule < 6cm on CT 05/15/2014   Essential hypertension 06/08/2015   Microalbuminuria 06/08/2015   Bruising 06/09/2015   Lumbosacral strain 01/17/2017   Dark urine 01/01/2019   Shakes 04/04/2019   Essential tremor 04/16/2019   Shoulder separation, right, subsequent encounter 06/17/2019   Primary osteoarthritis, right glenohumeral joint 07/02/2019   Current smoker 10/22/2019   Nosebleed 10/22/2019   Diabetic neuropathy ([place]) 01/28/2020   Family history of Alzheimer's disease 11/09/2020   Rib pain on left side 03/07/2021   Fall 03/07/2021   Chronic foot pain, right 05/03/2021   Acute left-sided low back pain without sciatica 06/07/2021   Diabetic foot infection ([place]) 06/28/2021   Psoriasis 06/28/2021   Absent pedal pulses 07/13/2021   Acute right ankle pain 07/13/2021   Family history of blood clots 07/26/2021    Leg edema, right 07/26/2021   Right leg pain 07/26/2021   Resolved Ambulatory Problems    Diagnosis Date Noted   KNEE PAIN, BILATERAL 12/28/2006   ELEVATED BLOOD PRESSURE WITHOUT DIAGNOSIS OF HYPERTENSION 08/29/2006   Cough 11/25/2010   Past Medical History:  Diagnosis Date   Ankylosing spondylitis ([place])    COPD (chronic obstructive pulmonary disease) ([place])    Diabetes mellitus without complication ([place])    High cholesterol    Hypertension       Review of Systems See HPI.     Objective:   Physical Exam  Right foot, ankle and leg swelling with non pitting edema around ankle into pitting edema in right leg.  Edema stops just above ankle.  Tenderness to palpation over lateral malleolus.  No pedal pulse in right dorsal foot Erythematous color over dorsal foot and into anterior shin and slightly warm to touch.     Assessment & Plan:  Marland KitchenMarland KitchenBesnik was seen today for edema.  Diagnoses and all orders for this visit:  Leg edema, right -     US Venous Img Lower Unilateral Right; Future -     furosemide (LASIX) 20 MG tablet; Take 1 tablet (20 mg total) by mouth daily.  Right leg pain -     US Venous Img Lower Unilateral Right; Future  Absent pedal pulses  Family history of blood clots   Unclear etiology of symptoms.  Will make sure no DVT with u/s today.  Schedule ABI  to look for any vascular etiology of swelling Start ASA 81mg  and lasix 20mg  daily.  Continue on lipitor. Keep foot elevated but no compression There is confirmed arthritis in lateral ankle but likely not causing this extensive swelling and pain. Follow up in 1 week.

## 2021-07-27 ENCOUNTER — Telehealth: Payer: Self-pay | Admitting: General Practice

## 2021-07-27 NOTE — Telephone Encounter (Signed)
Transition Care Management Follow-up Telephone Call ?Date of discharge and from where: 07/25/21 from [facility] ?How have you been since you were released from the hospital? Patient had OV with PCP on 06/30/21 ?Any questions or concerns? No ? ?

## 2021-07-29 ENCOUNTER — Other Ambulatory Visit: Payer: Self-pay | Admitting: Physician Assistant

## 2021-07-29 DIAGNOSIS — R0989 Other specified symptoms and signs involving the circulatory and respiratory systems: Secondary | ICD-10-CM

## 2021-07-29 DIAGNOSIS — E1129 Type 2 diabetes mellitus with other diabetic kidney complication: Secondary | ICD-10-CM

## 2021-08-01 ENCOUNTER — Emergency Department (HOSPITAL_COMMUNITY): Payer: Commercial Managed Care - PPO | Admitting: Anesthesiology

## 2021-08-01 ENCOUNTER — Emergency Department (HOSPITAL_BASED_OUTPATIENT_CLINIC_OR_DEPARTMENT_OTHER): Payer: Commercial Managed Care - PPO

## 2021-08-01 ENCOUNTER — Inpatient Hospital Stay: Admit: 2021-08-01 | Payer: Commercial Managed Care - PPO | Admitting: Orthopedic Surgery

## 2021-08-01 ENCOUNTER — Emergency Department (HOSPITAL_COMMUNITY): Payer: Commercial Managed Care - PPO

## 2021-08-01 ENCOUNTER — Inpatient Hospital Stay (HOSPITAL_COMMUNITY): Payer: Commercial Managed Care - PPO

## 2021-08-01 ENCOUNTER — Observation Stay (HOSPITAL_BASED_OUTPATIENT_CLINIC_OR_DEPARTMENT_OTHER)
Admission: EM | Admit: 2021-08-01 | Discharge: 2021-08-03 | Disposition: A | Discharge status: Home / Self Care | Payer: Commercial Managed Care - PPO | Attending: Orthopedic Surgery | Admitting: Orthopedic Surgery

## 2021-08-01 ENCOUNTER — Other Ambulatory Visit: Payer: Self-pay

## 2021-08-01 ENCOUNTER — Encounter (HOSPITAL_BASED_OUTPATIENT_CLINIC_OR_DEPARTMENT_OTHER): Payer: Self-pay

## 2021-08-01 ENCOUNTER — Encounter (HOSPITAL_COMMUNITY)
Admission: EM | Disposition: A | Discharge status: Home / Self Care | Payer: Self-pay | Source: Home / Self Care | Attending: Emergency Medicine

## 2021-08-01 DIAGNOSIS — I10 Essential (primary) hypertension: Secondary | ICD-10-CM | POA: Diagnosis not present

## 2021-08-01 DIAGNOSIS — S99911A Unspecified injury of right ankle, initial encounter: Secondary | ICD-10-CM | POA: Diagnosis present

## 2021-08-01 DIAGNOSIS — S9304XA Dislocation of right ankle joint, initial encounter: Secondary | ICD-10-CM | POA: Insufficient documentation

## 2021-08-01 DIAGNOSIS — J449 Chronic obstructive pulmonary disease, unspecified: Secondary | ICD-10-CM | POA: Diagnosis not present

## 2021-08-01 DIAGNOSIS — S93431A Sprain of tibiofibular ligament of right ankle, initial encounter: Secondary | ICD-10-CM | POA: Insufficient documentation

## 2021-08-01 DIAGNOSIS — Z794 Long term (current) use of insulin: Secondary | ICD-10-CM | POA: Insufficient documentation

## 2021-08-01 DIAGNOSIS — S92901A Unspecified fracture of right foot, initial encounter for closed fracture: Secondary | ICD-10-CM | POA: Diagnosis not present

## 2021-08-01 DIAGNOSIS — W1830XA Fall on same level, unspecified, initial encounter: Secondary | ICD-10-CM | POA: Diagnosis not present

## 2021-08-01 DIAGNOSIS — Z7984 Long term (current) use of oral hypoglycemic drugs: Secondary | ICD-10-CM | POA: Insufficient documentation

## 2021-08-01 DIAGNOSIS — E119 Type 2 diabetes mellitus without complications: Secondary | ICD-10-CM | POA: Diagnosis not present

## 2021-08-01 DIAGNOSIS — S93304A Unspecified dislocation of right foot, initial encounter: Secondary | ICD-10-CM

## 2021-08-01 DIAGNOSIS — E669 Obesity, unspecified: Secondary | ICD-10-CM | POA: Diagnosis present

## 2021-08-01 DIAGNOSIS — S82831A Other fracture of upper and lower end of right fibula, initial encounter for closed fracture: Secondary | ICD-10-CM | POA: Insufficient documentation

## 2021-08-01 DIAGNOSIS — F17210 Nicotine dependence, cigarettes, uncomplicated: Secondary | ICD-10-CM | POA: Diagnosis not present

## 2021-08-01 DIAGNOSIS — Z79899 Other long term (current) drug therapy: Secondary | ICD-10-CM | POA: Diagnosis not present

## 2021-08-01 DIAGNOSIS — M14671 Charcot's joint, right ankle and foot: Secondary | ICD-10-CM

## 2021-08-01 DIAGNOSIS — M450 Ankylosing spondylitis of multiple sites in spine: Secondary | ICD-10-CM | POA: Diagnosis present

## 2021-08-01 DIAGNOSIS — E1140 Type 2 diabetes mellitus with diabetic neuropathy, unspecified: Secondary | ICD-10-CM | POA: Diagnosis not present

## 2021-08-01 DIAGNOSIS — S92121A Displaced fracture of body of right talus, initial encounter for closed fracture: Principal | ICD-10-CM | POA: Insufficient documentation

## 2021-08-01 DIAGNOSIS — F17200 Nicotine dependence, unspecified, uncomplicated: Secondary | ICD-10-CM | POA: Diagnosis present

## 2021-08-01 DIAGNOSIS — L4050 Arthropathic psoriasis, unspecified: Secondary | ICD-10-CM | POA: Diagnosis present

## 2021-08-01 DIAGNOSIS — G8929 Other chronic pain: Secondary | ICD-10-CM | POA: Diagnosis present

## 2021-08-01 DIAGNOSIS — E1129 Type 2 diabetes mellitus with other diabetic kidney complication: Secondary | ICD-10-CM

## 2021-08-01 DIAGNOSIS — Z419 Encounter for procedure for purposes other than remedying health state, unspecified: Secondary | ICD-10-CM

## 2021-08-01 DIAGNOSIS — R0989 Other specified symptoms and signs involving the circulatory and respiratory systems: Secondary | ICD-10-CM | POA: Diagnosis present

## 2021-08-01 HISTORY — PX: ORIF CALCANEOUS FRACTURE: SHX5030

## 2021-08-01 LAB — CBC
HCT: 33.8 % — ABNORMAL LOW (ref 39.0–52.0)
HCT: 31.3 % — ABNORMAL LOW (ref 39.0–52.0)
Hemoglobin: 12.3 g/dL — ABNORMAL LOW (ref 13.0–17.0)
Hemoglobin: 11.1 g/dL — ABNORMAL LOW (ref 13.0–17.0)
MCH: 34 pg (ref 26.0–34.0)
MCH: 33.3 pg (ref 26.0–34.0)
MCHC: 36.4 g/dL — ABNORMAL HIGH (ref 30.0–36.0)
MCHC: 35.5 g/dL (ref 30.0–36.0)
MCV: 93.4 fL (ref 80.0–100.0)
MCV: 94 fL (ref 80.0–100.0)
Platelets: 237 10*3/uL (ref 150–400)
Platelets: 227 K/uL (ref 150–400)
RBC: 3.62 MIL/uL — ABNORMAL LOW (ref 4.22–5.81)
RBC: 3.33 MIL/uL — ABNORMAL LOW (ref 4.22–5.81)
RDW: 12.9 % (ref 11.5–15.5)
RDW: 12.9 % (ref 11.5–15.5)
WBC: 10.3 10*3/uL (ref 4.0–10.5)
WBC: 9.6 K/uL (ref 4.0–10.5)
nRBC: 0 % (ref 0.0–0.2)
nRBC: 0 % (ref 0.0–0.2)

## 2021-08-01 LAB — BASIC METABOLIC PANEL
Anion gap: 12 (ref 5–15)
BUN: 21 mg/dL — ABNORMAL HIGH (ref 6–20)
CO2: 24 mmol/L (ref 22–32)
Calcium: 9.2 mg/dL (ref 8.9–10.3)
Chloride: 88 mmol/L — ABNORMAL LOW (ref 98–111)
Creatinine, Ser: 1.5 mg/dL — ABNORMAL HIGH (ref 0.61–1.24)
GFR, Estimated: 54 mL/min — ABNORMAL LOW (ref >60)
Glucose, Bld: 88 mg/dL (ref 70–99)
Potassium: 4 mmol/L (ref 3.5–5.1)
Sodium: 124 mmol/L — ABNORMAL LOW (ref 135–145)

## 2021-08-01 LAB — CREATININE, SERUM
Creatinine, Ser: 1.63 mg/dL — ABNORMAL HIGH (ref 0.61–1.24)
GFR, Estimated: 49 mL/min — ABNORMAL LOW

## 2021-08-01 LAB — CBG MONITORING, ED: Glucose-Capillary: 97 mg/dL (ref 70–99)

## 2021-08-01 LAB — GLUCOSE, CAPILLARY
Glucose-Capillary: 102 mg/dL — ABNORMAL HIGH (ref 70–99)
Glucose-Capillary: 133 mg/dL — ABNORMAL HIGH (ref 70–99)
Glucose-Capillary: 211 mg/dL — ABNORMAL HIGH (ref 70–99)
Glucose-Capillary: 245 mg/dL — ABNORMAL HIGH (ref 70–99)

## 2021-08-01 SURGERY — OPEN REDUCTION INTERNAL FIXATION (ORIF) CALCANEOUS FRACTURE
Anesthesia: General | Laterality: Right

## 2021-08-01 MED ORDER — MIDAZOLAM HCL 2 MG/2ML IJ SOLN
INTRAMUSCULAR | Status: AC
  Filled 2021-08-01: qty 2

## 2021-08-01 MED ORDER — BISACODYL 5 MG PO TBEC
5.0000 mg | DELAYED_RELEASE_TABLET | Freq: Every day | ORAL | Status: DC | PRN
  Administered 2021-08-02: 5 mg via ORAL
  Filled 2021-08-01: qty 1

## 2021-08-01 MED ORDER — ACETAMINOPHEN 325 MG PO TABS: 325.0000 mg | ORAL_TABLET | Freq: Four times a day (QID) | ORAL | Status: DC | PRN

## 2021-08-01 MED ORDER — HYDROMORPHONE HCL 1 MG/ML IJ SOLN
INTRAMUSCULAR | Status: AC
  Administered 2021-08-01: 0.5 mg via INTRAVENOUS
  Filled 2021-08-01: qty 1

## 2021-08-01 MED ORDER — ONDANSETRON HCL 4 MG/2ML IJ SOLN: 4.0000 mg | Freq: Once | INTRAMUSCULAR | Status: DC | PRN

## 2021-08-01 MED ORDER — HYDROMORPHONE HCL 1 MG/ML IJ SOLN
0.5000 mg | INTRAMUSCULAR | Status: DC | PRN
  Administered 2021-08-01 – 2021-08-02 (×2): 1 mg via INTRAVENOUS
  Filled 2021-08-01 (×2): qty 1

## 2021-08-01 MED ORDER — ROCURONIUM BROMIDE 10 MG/ML (PF) SYRINGE
PREFILLED_SYRINGE | INTRAVENOUS | Status: AC
  Filled 2021-08-01: qty 10

## 2021-08-01 MED ORDER — CHLORHEXIDINE GLUCONATE 0.12 % MT SOLN
15.0000 mL | Freq: Once | OROMUCOSAL | Status: AC
  Administered 2021-08-01: 15 mL via OROMUCOSAL

## 2021-08-01 MED ORDER — SEMAGLUTIDE (2 MG/DOSE) 8 MG/3ML ~~LOC~~ SOPN: 2.0000 mg | PEN_INJECTOR | SUBCUTANEOUS | Status: DC

## 2021-08-01 MED ORDER — FENTANYL CITRATE (PF) 250 MCG/5ML IJ SOLN
INTRAMUSCULAR | Status: DC | PRN
  Administered 2021-08-01 (×4): 50 ug via INTRAVENOUS

## 2021-08-01 MED ORDER — DEXAMETHASONE SODIUM PHOSPHATE 10 MG/ML IJ SOLN
INTRAMUSCULAR | Status: DC | PRN
  Administered 2021-08-01: 5 mg via INTRAVENOUS

## 2021-08-01 MED ORDER — INSULIN ASPART 100 UNIT/ML IJ SOLN: 0.0000 [IU] | INTRAMUSCULAR | Status: DC | PRN

## 2021-08-01 MED ORDER — INSULIN ASPART 100 UNIT/ML IJ SOLN
4.0000 [IU] | Freq: Three times a day (TID) | INTRAMUSCULAR | Status: DC
  Administered 2021-08-02 – 2021-08-03 (×3): 4 [IU] via SUBCUTANEOUS

## 2021-08-01 MED ORDER — INSULIN ASPART 100 UNIT/ML IJ SOLN
0.0000 [IU] | Freq: Three times a day (TID) | INTRAMUSCULAR | Status: DC
  Administered 2021-08-02: 5 [IU] via SUBCUTANEOUS
  Administered 2021-08-02: 3 [IU] via SUBCUTANEOUS

## 2021-08-01 MED ORDER — OXYCODONE HCL 5 MG PO TABS
ORAL_TABLET | ORAL | Status: AC
  Filled 2021-08-01: qty 1

## 2021-08-01 MED ORDER — POTASSIUM CHLORIDE IN NACL 20-0.9 MEQ/L-% IV SOLN
INTRAVENOUS | Status: DC
  Filled 2021-08-01: qty 1000

## 2021-08-01 MED ORDER — HYDROMORPHONE HCL 1 MG/ML IJ SOLN
1.0000 mg | Freq: Once | INTRAMUSCULAR | Status: AC
  Administered 2021-08-01: 1 mg via INTRAMUSCULAR
  Filled 2021-08-01: qty 1

## 2021-08-01 MED ORDER — MIDAZOLAM HCL 2 MG/2ML IJ SOLN
INTRAMUSCULAR | Status: DC | PRN
Start: 2021-08-01 — End: 2021-08-01
  Administered 2021-08-01: 2 mg via INTRAVENOUS

## 2021-08-01 MED ORDER — PHENYLEPHRINE HCL-NACL 20-0.9 MG/250ML-% IV SOLN
INTRAVENOUS | Status: DC | PRN
  Administered 2021-08-01: 25 ug/min via INTRAVENOUS

## 2021-08-01 MED ORDER — ONDANSETRON HCL 4 MG PO TABS: 4.0000 mg | ORAL_TABLET | Freq: Four times a day (QID) | ORAL | Status: DC | PRN

## 2021-08-01 MED ORDER — AMLODIPINE BESYLATE 5 MG PO TABS
5.0000 mg | ORAL_TABLET | Freq: Every day | ORAL | Status: DC
  Administered 2021-08-02 – 2021-08-03 (×2): 5 mg via ORAL
  Filled 2021-08-01 (×2): qty 1

## 2021-08-01 MED ORDER — METHOCARBAMOL 1000 MG/10ML IJ SOLN
500.0000 mg | Freq: Four times a day (QID) | INTRAMUSCULAR | Status: DC | PRN
  Filled 2021-08-01: qty 5

## 2021-08-01 MED ORDER — CEFAZOLIN SODIUM-DEXTROSE 2-4 GM/100ML-% IV SOLN
INTRAVENOUS | Status: AC
  Filled 2021-08-01: qty 100

## 2021-08-01 MED ORDER — LIDOCAINE 2% (20 MG/ML) 5 ML SYRINGE
INTRAMUSCULAR | Status: DC | PRN
Start: 2021-08-01 — End: 2021-08-01
  Administered 2021-08-01: 100 mg via INTRAVENOUS

## 2021-08-01 MED ORDER — IMIPRAMINE HCL 25 MG PO TABS
200.0000 mg | ORAL_TABLET | Freq: Every day | ORAL | Status: DC
  Administered 2021-08-02 (×2): 200 mg via ORAL
  Filled 2021-08-01 (×3): qty 8

## 2021-08-01 MED ORDER — HYDROMORPHONE HCL 1 MG/ML IJ SOLN
0.2500 mg | INTRAMUSCULAR | Status: DC | PRN
  Administered 2021-08-01 (×2): 0.5 mg via INTRAVENOUS

## 2021-08-01 MED ORDER — OXYCODONE HCL 5 MG PO TABS
5.0000 mg | ORAL_TABLET | Freq: Once | ORAL | Status: AC | PRN
  Administered 2021-08-01: 5 mg via ORAL

## 2021-08-01 MED ORDER — ROCURONIUM BROMIDE 10 MG/ML (PF) SYRINGE
PREFILLED_SYRINGE | INTRAVENOUS | Status: DC | PRN
  Administered 2021-08-01: 60 mg via INTRAVENOUS

## 2021-08-01 MED ORDER — ORAL CARE MOUTH RINSE: 15.0000 mL | Freq: Once | OROMUCOSAL | Status: AC

## 2021-08-01 MED ORDER — PRIMIDONE 50 MG PO TABS
50.0000 mg | ORAL_TABLET | Freq: Every day | ORAL | Status: DC
  Administered 2021-08-02 (×2): 50 mg via ORAL
  Filled 2021-08-01 (×2): qty 1

## 2021-08-01 MED ORDER — SUCCINYLCHOLINE CHLORIDE 200 MG/10ML IV SOSY
PREFILLED_SYRINGE | INTRAVENOUS | Status: AC
  Filled 2021-08-01: qty 10

## 2021-08-01 MED ORDER — DOCUSATE SODIUM 100 MG PO CAPS
100.0000 mg | ORAL_CAPSULE | Freq: Two times a day (BID) | ORAL | Status: DC
  Administered 2021-08-01 – 2021-08-03 (×4): 100 mg via ORAL
  Filled 2021-08-01 (×4): qty 1

## 2021-08-01 MED ORDER — INSULIN GLARGINE (2 UNIT DIAL) 300 UNIT/ML ~~LOC~~ SOPN: 25.0000 [IU] | PEN_INJECTOR | Freq: Every day | SUBCUTANEOUS | Status: DC

## 2021-08-01 MED ORDER — SENNOSIDES-DOCUSATE SODIUM 8.6-50 MG PO TABS
1.0000 | ORAL_TABLET | Freq: Every evening | ORAL | Status: DC | PRN
  Administered 2021-08-02: 1 via ORAL
  Filled 2021-08-01: qty 1

## 2021-08-01 MED ORDER — METOCLOPRAMIDE HCL 5 MG/ML IJ SOLN: 5.0000 mg | Freq: Three times a day (TID) | INTRAMUSCULAR | Status: DC | PRN

## 2021-08-01 MED ORDER — PHENYLEPHRINE 40 MCG/ML (10ML) SYRINGE FOR IV PUSH (FOR BLOOD PRESSURE SUPPORT)
PREFILLED_SYRINGE | INTRAVENOUS | Status: AC
  Filled 2021-08-01: qty 10

## 2021-08-01 MED ORDER — PROPOFOL 10 MG/ML IV BOLUS
100.0000 mg | Freq: Once | INTRAVENOUS | Status: AC
  Administered 2021-08-01: 80 mg via INTRAVENOUS
  Filled 2021-08-01: qty 20

## 2021-08-01 MED ORDER — APREMILAST 30 MG PO TABS: 30.0000 mg | ORAL_TABLET | Freq: Two times a day (BID) | ORAL | Status: DC

## 2021-08-01 MED ORDER — CALCIUM CITRATE 950 (200 CA) MG PO TABS
200.0000 mg | ORAL_TABLET | Freq: Two times a day (BID) | ORAL | Status: DC
  Administered 2021-08-01 – 2021-08-03 (×4): 200 mg via ORAL
  Filled 2021-08-01 (×5): qty 1

## 2021-08-01 MED ORDER — CEFAZOLIN SODIUM-DEXTROSE 2-4 GM/100ML-% IV SOLN
2.0000 g | Freq: Three times a day (TID) | INTRAVENOUS | Status: AC
  Administered 2021-08-01 – 2021-08-02 (×3): 2 g via INTRAVENOUS
  Filled 2021-08-01 (×3): qty 100

## 2021-08-01 MED ORDER — PROPOFOL 10 MG/ML IV BOLUS
INTRAVENOUS | Status: AC | PRN
  Administered 2021-08-01: 80 mg via INTRAVENOUS

## 2021-08-01 MED ORDER — OXYCODONE-ACETAMINOPHEN 5-325 MG PO TABS: 1.0000 | ORAL_TABLET | Freq: Four times a day (QID) | ORAL | 0 refills | Status: DC | PRN

## 2021-08-01 MED ORDER — OXYCODONE HCL 5 MG/5ML PO SOLN: 5.0000 mg | Freq: Once | ORAL | Status: AC | PRN

## 2021-08-01 MED ORDER — ACETAMINOPHEN 500 MG PO TABS
1000.0000 mg | ORAL_TABLET | Freq: Three times a day (TID) | ORAL | Status: DC
  Administered 2021-08-01 – 2021-08-03 (×5): 1000 mg via ORAL
  Filled 2021-08-01 (×5): qty 2

## 2021-08-01 MED ORDER — 0.9 % SODIUM CHLORIDE (POUR BTL) OPTIME
TOPICAL | Status: DC | PRN
  Administered 2021-08-01: 1000 mL

## 2021-08-01 MED ORDER — SUGAMMADEX SODIUM 200 MG/2ML IV SOLN
INTRAVENOUS | Status: DC | PRN
Start: 2021-08-01 — End: 2021-08-01
  Administered 2021-08-01: 200 mg via INTRAVENOUS

## 2021-08-01 MED ORDER — ONDANSETRON HCL 4 MG/2ML IJ SOLN: 4.0000 mg | Freq: Four times a day (QID) | INTRAMUSCULAR | Status: DC | PRN

## 2021-08-01 MED ORDER — ONDANSETRON HCL 4 MG/2ML IJ SOLN
INTRAMUSCULAR | Status: DC | PRN
Start: 2021-08-01 — End: 2021-08-01
  Administered 2021-08-01: 4 mg via INTRAVENOUS

## 2021-08-01 MED ORDER — OXYCODONE HCL 5 MG PO TABS
10.0000 mg | ORAL_TABLET | ORAL | Status: DC | PRN
  Administered 2021-08-02: 10 mg via ORAL
  Administered 2021-08-02 – 2021-08-03 (×3): 15 mg via ORAL
  Filled 2021-08-01 (×2): qty 3
  Filled 2021-08-01: qty 2
  Filled 2021-08-01: qty 3

## 2021-08-01 MED ORDER — CEFAZOLIN SODIUM-DEXTROSE 2-4 GM/100ML-% IV SOLN
2.0000 g | Freq: Once | INTRAVENOUS | Status: AC
Start: 2021-08-01 — End: 2021-08-01
  Administered 2021-08-01: 2 g via INTRAVENOUS

## 2021-08-01 MED ORDER — SUCCINYLCHOLINE CHLORIDE 200 MG/10ML IV SOSY
PREFILLED_SYRINGE | INTRAVENOUS | Status: DC | PRN
  Administered 2021-08-01: 160 mg via INTRAVENOUS

## 2021-08-01 MED ORDER — PROPOFOL 10 MG/ML IV BOLUS
INTRAVENOUS | Status: DC | PRN
  Administered 2021-08-01: 30 mg via INTRAVENOUS
  Administered 2021-08-01: 170 mg via INTRAVENOUS

## 2021-08-01 MED ORDER — ASCORBIC ACID 500 MG PO TABS
500.0000 mg | ORAL_TABLET | Freq: Every day | ORAL | Status: DC
  Administered 2021-08-01 – 2021-08-03 (×3): 500 mg via ORAL
  Filled 2021-08-01 (×3): qty 1

## 2021-08-01 MED ORDER — HYDROCHLOROTHIAZIDE 25 MG PO TABS
25.0000 mg | ORAL_TABLET | Freq: Every day | ORAL | Status: DC
  Administered 2021-08-02 – 2021-08-03 (×2): 25 mg via ORAL
  Filled 2021-08-01 (×2): qty 1

## 2021-08-01 MED ORDER — OXYCODONE HCL 5 MG PO TABS
5.0000 mg | ORAL_TABLET | ORAL | Status: DC | PRN
  Administered 2021-08-02 – 2021-08-03 (×3): 10 mg via ORAL
  Filled 2021-08-01 (×3): qty 2

## 2021-08-01 MED ORDER — FENTANYL CITRATE (PF) 250 MCG/5ML IJ SOLN
INTRAMUSCULAR | Status: AC
  Filled 2021-08-01: qty 5

## 2021-08-01 MED ORDER — LACTATED RINGERS IV SOLN: INTRAVENOUS | Status: DC

## 2021-08-01 MED ORDER — KETOROLAC TROMETHAMINE 30 MG/ML IJ SOLN
INTRAMUSCULAR | Status: AC
  Administered 2021-08-01: 30 mg via INTRAVENOUS
  Filled 2021-08-01: qty 1

## 2021-08-01 MED ORDER — KETOROLAC TROMETHAMINE 30 MG/ML IJ SOLN: 30.0000 mg | Freq: Once | INTRAMUSCULAR | Status: AC | PRN

## 2021-08-01 MED ORDER — METOCLOPRAMIDE HCL 5 MG PO TABS
5.0000 mg | ORAL_TABLET | Freq: Three times a day (TID) | ORAL | Status: DC | PRN
  Filled 2021-08-01: qty 2

## 2021-08-01 MED ORDER — ONDANSETRON HCL 4 MG/2ML IJ SOLN
INTRAMUSCULAR | Status: AC
  Filled 2021-08-01: qty 2

## 2021-08-01 MED ORDER — PREGABALIN 75 MG PO CAPS
75.0000 mg | ORAL_CAPSULE | Freq: Two times a day (BID) | ORAL | Status: DC
  Administered 2021-08-02 – 2021-08-03 (×4): 75 mg via ORAL
  Filled 2021-08-01 (×4): qty 1

## 2021-08-01 MED ORDER — METHOCARBAMOL 500 MG PO TABS
ORAL_TABLET | ORAL | Status: AC
  Filled 2021-08-01: qty 1

## 2021-08-01 MED ORDER — PHENYLEPHRINE 40 MCG/ML (10ML) SYRINGE FOR IV PUSH (FOR BLOOD PRESSURE SUPPORT)
PREFILLED_SYRINGE | INTRAVENOUS | Status: DC | PRN
  Administered 2021-08-01: 40 ug via INTRAVENOUS
  Administered 2021-08-01: 80 ug via INTRAVENOUS

## 2021-08-01 MED ORDER — DEXAMETHASONE SODIUM PHOSPHATE 10 MG/ML IJ SOLN
INTRAMUSCULAR | Status: AC
  Filled 2021-08-01: qty 1

## 2021-08-01 MED ORDER — VITAMIN D 25 MCG (1000 UNIT) PO TABS
2000.0000 [IU] | ORAL_TABLET | Freq: Two times a day (BID) | ORAL | Status: DC
  Administered 2021-08-01 – 2021-08-03 (×4): 2000 [IU] via ORAL
  Filled 2021-08-01 (×4): qty 2

## 2021-08-01 MED ORDER — ENOXAPARIN SODIUM 40 MG/0.4ML IJ SOSY
40.0000 mg | PREFILLED_SYRINGE | INTRAMUSCULAR | Status: DC
  Administered 2021-08-02 – 2021-08-03 (×2): 40 mg via SUBCUTANEOUS
  Filled 2021-08-01 (×2): qty 0.4

## 2021-08-01 MED ORDER — METHOCARBAMOL 500 MG PO TABS
500.0000 mg | ORAL_TABLET | Freq: Four times a day (QID) | ORAL | Status: DC | PRN
  Administered 2021-08-01 – 2021-08-03 (×4): 500 mg via ORAL
  Filled 2021-08-01 (×3): qty 1

## 2021-08-01 SURGICAL SUPPLY — 61 items
BAG COUNTER SPONGE SURGICOUNT (BAG) ×3 IMPLANT
BAG SPNG CNTER NS LX DISP (BAG) ×1
BAG SURGICOUNT SPONGE COUNTING (BAG) ×1
BIT DRILL 2.5X2.75 QC CALB (BIT) ×2 IMPLANT
BLADE SURG 10 STRL SS (BLADE) ×4 IMPLANT
BNDG COHESIVE 4X5 TAN STRL (GAUZE/BANDAGES/DRESSINGS) ×4 IMPLANT
BNDG ELASTIC 4X5.8 VLCR STR LF (GAUZE/BANDAGES/DRESSINGS) ×4 IMPLANT
BNDG ELASTIC 6X5.8 VLCR STR LF (GAUZE/BANDAGES/DRESSINGS) ×4 IMPLANT
BNDG GAUZE ELAST 4 BULKY (GAUZE/BANDAGES/DRESSINGS) ×6 IMPLANT
BRUSH SCRUB EZ PLAIN DRY (MISCELLANEOUS) ×8 IMPLANT
COVER MAYO STAND STRL (DRAPES) IMPLANT
COVER SURGICAL LIGHT HANDLE (MISCELLANEOUS) ×6 IMPLANT
DRAIN TLS ROUND 10FR (DRAIN) IMPLANT
DRAPE C-ARM 42X72 X-RAY (DRAPES) ×4 IMPLANT
DRAPE C-ARMOR (DRAPES) ×4 IMPLANT
DRAPE U-SHAPE 47X51 STRL (DRAPES) ×4 IMPLANT
DRSG EMULSION OIL 3X3 NADH (GAUZE/BANDAGES/DRESSINGS) ×4 IMPLANT
DRSG MEPITEL 4X7.2 (GAUZE/BANDAGES/DRESSINGS) ×2 IMPLANT
ELECT REM PT RETURN 9FT ADLT (ELECTROSURGICAL) ×3
ELECTRODE REM PT RTRN 9FT ADLT (ELECTROSURGICAL) ×2 IMPLANT
FIXATION ZIPTIGHT ANKLE SNDSMS (Ankle) IMPLANT
GAUZE SPONGE 4X4 12PLY STRL (GAUZE/BANDAGES/DRESSINGS) ×4 IMPLANT
GAUZE SPONGE 4X4 12PLY STRL LF (GAUZE/BANDAGES/DRESSINGS) ×2 IMPLANT
GLOVE SURG ENC MOIS LTX SZ8 (GLOVE) ×8 IMPLANT
GLOVE SURG ORTHO LTX SZ7.5 (GLOVE) ×8 IMPLANT
GLOVE SURG UNDER POLY LF SZ7.5 (GLOVE) ×4 IMPLANT
GOWN STRL REUS W/ TWL LRG LVL3 (GOWN DISPOSABLE) ×4 IMPLANT
GOWN STRL REUS W/ TWL XL LVL3 (GOWN DISPOSABLE) ×2 IMPLANT
GOWN STRL REUS W/TWL LRG LVL3 (GOWN DISPOSABLE) ×6
GOWN STRL REUS W/TWL XL LVL3 (GOWN DISPOSABLE) ×3
KIT BASIN OR (CUSTOM PROCEDURE TRAY) ×4 IMPLANT
KIT TURNOVER KIT B (KITS) ×4 IMPLANT
MANIFOLD NEPTUNE II (INSTRUMENTS) ×4 IMPLANT
NDL HYPO 21X1.5 SAFETY (NEEDLE) IMPLANT
NEEDLE HYPO 21X1.5 SAFETY (NEEDLE) IMPLANT
NS IRRIG 1000ML POUR BTL (IV SOLUTION) ×4 IMPLANT
PACK ORTHO EXTREMITY (CUSTOM PROCEDURE TRAY) ×4 IMPLANT
PAD ARMBOARD 7.5X6 YLW CONV (MISCELLANEOUS) ×8 IMPLANT
PAD CAST 4YDX4 CTTN HI CHSV (CAST SUPPLIES) ×4 IMPLANT
PADDING CAST COTTON 4X4 STRL (CAST SUPPLIES) ×6
PADDING CAST COTTON 6X4 STRL (CAST SUPPLIES) ×4 IMPLANT
PLATE ACE 100DEG 3HOLE (Plate) ×2 IMPLANT
SCREW CORTICAL 3.5MM 14MM (Screw) ×2 IMPLANT
SCREW SHANZ 4.0X60MM (EXFIX) ×4 IMPLANT
SPLINT PLASTER CAST XFAST 5X30 (CAST SUPPLIES) IMPLANT
SPLINT PLASTER XFAST SET 5X30 (CAST SUPPLIES) ×2
SPONGE T-LAP 18X18 ~~LOC~~+RFID (SPONGE) IMPLANT
SUCTION FRAZIER HANDLE 10FR (MISCELLANEOUS) ×3
SUCTION TUBE FRAZIER 10FR DISP (MISCELLANEOUS) ×2 IMPLANT
SUT ETHILON 2 0 FS 18 (SUTURE) ×8 IMPLANT
SUT VIC AB 2-0 CT3 27 (SUTURE) IMPLANT
SUT VIC AB 2-0 SH 18 (SUTURE) IMPLANT
SUT VIC AB 3-0 FS2 27 (SUTURE) ×4 IMPLANT
SYR CONTROL 10ML LL (SYRINGE) IMPLANT
TOWEL GREEN STERILE (TOWEL DISPOSABLE) ×4 IMPLANT
TOWEL GREEN STERILE FF (TOWEL DISPOSABLE) ×4 IMPLANT
TUBE CONNECTING 12'X1/4 (SUCTIONS) ×1
TUBE CONNECTING 12X1/4 (SUCTIONS) ×3 IMPLANT
UNDERPAD 30X36 HEAVY ABSORB (UNDERPADS AND DIAPERS) ×4 IMPLANT
WATER STERILE IRR 1000ML POUR (IV SOLUTION) ×4 IMPLANT
ZIPTIGHT ANKLE SYNODESMOSS FIX (Ankle) ×6 IMPLANT

## 2021-08-01 NOTE — ED Notes (Signed)
Pt gave verbal consent for transport to [facility] for Orthopedic consult.  ?

## 2021-08-01 NOTE — ED Notes (Signed)
Completed blood glucose- 97 mg/dL (monitor has been docked for results to be transported). ?

## 2021-08-01 NOTE — ED Notes (Signed)
Phone Handoff Report given to [facility] Transport Team 

## 2021-08-01 NOTE — Sedation Documentation (Signed)
Sleeping at this time.

## 2021-08-01 NOTE — Anesthesia Preprocedure Evaluation (Addendum)
Anesthesia Evaluation  ?Patient identified by MRN, date of birth, ID band ?Patient awake ? ? ? ?Reviewed: ?Allergy & Precautions, NPO status , Patient's Chart, lab work & pertinent test results ? ?Airway ?Mallampati: III ? ?TM Distance: <3 FB ?Neck ROM: Limited ? ? ? Dental ?no notable dental hx. ?(+) Dental Advisory Given ?  ?Pulmonary ?COPD, Current Smoker and Patient abstained from smoking.,  ?  ?Pulmonary exam normal ?breath sounds clear to auscultation ? ? ? ? ? ? Cardiovascular ?hypertension, Pt. on medications ?Normal cardiovascular exam ?Rhythm:Regular Rate:Normal ? ? ?  ?Neuro/Psych ?negative neurological ROS ? negative psych ROS  ? GI/Hepatic ?negative GI ROS, Neg liver ROS,   ?Endo/Other  ?diabetes, Insulin Dependent ? Renal/GU ?negative Renal ROS  ?negative genitourinary ?  ?Musculoskeletal ? ?(+) Arthritis , Ankylosing spondylitis   ? Abdominal ?  ?Peds ?negative pediatric ROS ?(+)  Hematology ?negative hematology ROS ?(+)   ?Anesthesia Other Findings ? ? Reproductive/Obstetrics ?negative OB ROS ? ?  ? ? ? ? ? ? ? ? ? ? ? ? ? ?  ?  ? ? ? ? ? ? ?Anesthesia Physical ?Anesthesia Plan ? ?ASA: 3 ? ?Anesthesia Plan: General  ? ?Post-op Pain Management: Dilaudid IV  ? ?Induction: Intravenous ? ?PONV Risk Score and Plan: 1 and Ondansetron, Treatment may vary due to age or medical condition and Midazolam ? ?Airway Management Planned: Oral ETT ? ?Additional Equipment:  ? ?Intra-op Plan:  ? ?Post-operative Plan: Extubation in OR ? ?Informed Consent: I have reviewed the patients History and Physical, chart, labs and discussed the procedure including the risks, benefits and alternatives for the proposed anesthesia with the patient or authorized representative who has indicated his/her understanding and acceptance.  ? ? ? ?Dental advisory given ? ?Plan Discussed with: CRNA and Surgeon ? ?Anesthesia Plan Comments:   ? ? ? ? ?Anesthesia Quick Evaluation ? ?

## 2021-08-01 NOTE — Transfer of Care (Signed)
Immediate Anesthesia Transfer of Care Note ? ?Patient: [place] ? ?Procedure(s) Performed: OPEN REDUCTION INTERNAL FIXATION (ORIF) FOOT FRACTURE (Right) ? ?Patient Location: PACU ? ?Anesthesia Type:General ? ?Level of Consciousness: alert , oriented and patient cooperative ? ?Airway & Oxygen Therapy: Patient Spontanous Breathing and Patient connected to face mask oxygen ? ?Post-op Assessment: Report given to RN and Post -op Vital signs reviewed and stable ? ?Post vital signs: Reviewed ? ?Last Vitals:  ?Vitals Value Taken Time  ?BP 118/55 08/01/21 1617  ?Temp 36.8 ?C 08/01/21 1617  ?Pulse 91 08/01/21 1619  ?Resp 20 08/01/21 1619  ?SpO2 97 % 08/01/21 1619  ?Vitals shown include unvalidated device data. ? ?Last Pain:  ?Vitals:  ? 08/01/21 1316  ?TempSrc: Oral  ?PainSc: 9   ?   ? ?  ? ?Complications: No notable events documented. ?

## 2021-08-01 NOTE — Anesthesia Procedure Notes (Signed)
Procedure Name: Intubation ?Date/Time: 08/01/2021 2:15 PM ?Performed by: Carolan Clines, CRNA ?Pre-anesthesia Checklist: Patient identified, Emergency Drugs available, Suction available and Patient being monitored ?Patient Re-evaluated:Patient Re-evaluated prior to induction ?Oxygen Delivery Method: Circle System Utilized ?Preoxygenation: Pre-oxygenation with 100% oxygen ?Induction Type: IV induction ?Ventilation: Mask ventilation with difficulty, Oral airway inserted - appropriate to patient size and Two handed mask ventilation required ?Laryngoscope Size: Glidescope and 4 ?Grade View: Grade I ?Tube type: Oral ?Tube size: 7.5 mm ?Number of attempts: 1 ?Airway Equipment and Method: Oral airway, Rigid stylet and Video-laryngoscopy ?Placement Confirmation: ETT inserted through vocal cords under direct vision, positive ETCO2 and breath sounds checked- equal and bilateral ?Secured at: 23 cm ?Tube secured with: Tape ?Dental Injury: Teeth and Oropharynx as per pre-operative assessment  ?Difficulty Due To: Difficulty was anticipated ?Comments: Elective glidescope intubation d/t Mallampati III/limited oral opening. Difficult mask r/t redundant tissue and beard. ? ? ? ? ?

## 2021-08-01 NOTE — ED Triage Notes (Signed)
Pt states he has an ankle injury and was walking in socks on a hard wood floor when he fell and hurt right knee.  Pt c/o pain in right knee.  ?

## 2021-08-01 NOTE — Consult Note (Signed)
Orthopaedic Trauma Service (OTS) Consultation   Patient ID: Robert Frey MRN: [id number] DOB/AGE: 59-Sep-1962 59 y.o.   Reason for Consult: right foot dislocation Referring Physician: Zadie Rhine, MD  HPI: Robert Frey is an 61 y.o. male fell on new hardwood floors with foot hitting doorjamb going to bathroom in wee hours with subsequent pain. Baseline neuropathy and baseline significant bilateral ankle pain and arthritis.  Past Medical History:  Diagnosis Date   Ankylosing spondylitis ([name])    COPD (chronic obstructive pulmonary disease) ([name])    Diabetes mellitus without complication ([name])    High cholesterol    Hypertension     Past Surgical History:  Procedure Laterality Date   APPENDECTOMY     CARPAL TUNNEL RELEASE Right    ent surgery     TONSILLECTOMY AND ADENOIDECTOMY      Family History  Problem Relation Age of Onset   Stroke Other    Alcohol abuse Other    Depression Other    Hypertension Other    Hyperlipidemia Other    Diabetes Mother    Alzheimer's disease Mother    Diabetes Sister    Stroke Sister     Social History:  reports that he has been smoking cigarettes. He has a 25.00 pack-year smoking history. He has never used smokeless tobacco. He reports that he does not drink alcohol and does not use drugs.   Allergies:  Allergies  Allergen Reactions   Azithromycin Rash   Green Dyes Hives   Propranolol     Nausea/vomiting   Zithromax [Azithromycin Dihydrate] Rash    Medications: Prior to Admission:  Medications Prior to Admission  Medication Sig Dispense Refill Last Dose   amLODipine (NORVASC) 5 MG tablet Take 1 tablet (5 mg total) by mouth daily. 90 tablet 3    Apremilast (OTEZLA) 30 MG TABS Take 1 tablet (30 mg total) by mouth in the morning and at bedtime. 60 tablet 5    atorvastatin (LIPITOR) 80 MG tablet Take 1 tablet (80 mg total) by mouth daily. 90 tablet 3    clobetasol cream (TEMOVATE) 0.05 % Apply 1 application  topically 2 (two) times daily. (Patient taking differently: Apply 1 application topically daily as needed.) 80 g 2    Dapagliflozin-metFORMIN HCl ER (XIGDUO XR) 02-999 MG TB24 Take 1 tablet by mouth daily. 90 tablet 0    Evolocumab (REPATHA) 140 MG/ML SOSY Inject 140 mg into the skin every 14 (fourteen) days. 2 mL 5    furosemide (LASIX) 20 MG tablet Take 1 tablet (20 mg total) by mouth daily. 30 tablet 0    hydrochlorothiazide (HYDRODIURIL) 25 MG tablet Take 1 tablet (25 mg total) by mouth daily. 90 tablet 3    icosapent Ethyl (VASCEPA) 1 g capsule Take 2 capsules (2 g total) by mouth 2 (two) times daily. 120 capsule 11    imipramine (TOFRANIL) 50 MG tablet TAKE 4 TABLETS (200 MG TOTAL) BY MOUTH AT BEDTIME. 360 tablet 3    insulin glargine, 2 Unit Dial, (TOUJEO MAX SOLOSTAR) 300 UNIT/ML Solostar Pen INJECT 25 UNITS AT BEDTIME. INCREASE BY 2 UNITS DAILY UNTIL FASTING SUGARS ARE BETWEEN 90-130 12 mL 0    Insulin Pen Needle (PEN NEEDLES) 32G X 4 MM MISC Inject 1 Units into the skin daily. 100 each 99    L-Methylfolate-Algae-B12-B6 (FOLTANX RF) 3-90.314-2-35 MG CAPS Take 1 tablet by mouth in the morning and at bedtime. 60 capsule 11    olmesartan (  BENICAR) 20 MG tablet Take 1 tablet (20 mg total) by mouth daily. 90 tablet 1    pregabalin (LYRICA) 75 MG capsule Take 1 capsule (75 mg total) by mouth 2 (two) times daily. 180 capsule 1    primidone (MYSOLINE) 50 MG tablet TAKE 1 TABLET BY MOUTH EVERY DAY 90 tablet 3    Semaglutide, 2 MG/DOSE, 8 MG/3ML SOPN Inject 2 mg as directed once a week. 9 mL 0    triamcinolone 0.5%-Eucerin equivalent 1:1 cream mixture Apply topically 2 (two) times daily. Mix 0.5% triamcinolone cream and Eucerin in a one-to-one mixture. 454 g 1     Results for orders placed or performed during the hospital encounter of 08/01/21 (from the past 48 hour(s))  POC CBG, ED     Status: None   Collection Time: 08/01/21 11:43 AM  Result Value Ref Range   Glucose-Capillary 97 70 - 99 mg/dL     Comment: Glucose reference range applies only to samples taken after fasting for at least 8 hours.  Glucose, capillary     Status: Abnormal   Collection Time: 08/01/21  1:34 PM  Result Value Ref Range   Glucose-Capillary 102 (H) 70 - 99 mg/dL    Comment: Glucose reference range applies only to samples taken after fasting for at least 8 hours.    DG Tibia/Fibula Right  Result Date: 08/01/2021 CLINICAL DATA:  61 year old male with history of trauma from a fall. Right leg pain. EXAM: RIGHT TIBIA AND FIBULA - 2 VIEW COMPARISON:  Right knee radiograph 08/01/2021. FINDINGS: Again noted is an acute minimally displaced oblique fracture of the proximal third of the fibular diaphysis. Tibia is intact. IMPRESSION: 1. Acute minimally displaced oblique fracture of the proximal third of the fibular diaphysis. Electronically Signed   By: Trudie Reed M.D.   On: 08/01/2021 06:28   CT Foot Right Wo Contrast  Result Date: 08/01/2021 CLINICAL DATA:  Chopart joint fracture dislocation with possible Lisfranc joint involvement. EXAM: CT OF THE RIGHT FOOT WITHOUT CONTRAST TECHNIQUE: Multidetector CT imaging of the right foot was performed according to the standard protocol. Multiplanar CT image reconstructions were also generated. RADIATION DOSE REDUCTION: This exam was performed according to the departmental dose-optimization program which includes automated exposure control, adjustment of the mA and/or kV according to patient size and/or use of iterative reconstruction technique. COMPARISON:  Radiographs 08/01/2021 FINDINGS: Bones/Joint/Cartilage Abnormal calcifications in the expected vicinity of the anterior inferior tibiofibular ligament. In conjunction with the proximal fibular fracture this could indicate syndesmotic injury. There is fracture dislocation at the Chopart joint mainly involving the talonavicular articulation, with the talar head impacted and inferiorly displaced, with small bony fragments and  possible early osteoid deposition above the displaced talar head, and with mild medial deviation of the talar head. There also appears to be some fragmentation and osteophyte production along the inferomedial margin of the navicular favoring navicular fracture for example on image 93 series 6. The flattened talar head is inferiorly displaced with respect to the navicular. The calcaneocuboid articulation is not as disrupted, with roughly anatomic alignment and without well-defined fractures along the calcaneocuboid joint. However, the articular relationship of the cuboid with the lateral cuneiform and the bases of the fourth and fifth metatarsals is disrupted, with inferomedial displacement of the cuboid with respect to the structures raising suspicion that the dominant displacement plane extends through the tibiotalar portion of the Chopart joint and then through the articulation between the calcaneus and the lateral cuneiform and metatarsals. Small bony  fragments are present along the margins of the cuboid, representing avulsions, although some of these may possibly have arisen from the bases of the fourth and fifth metatarsals. The integrity of the rest of the Lisfranc joint is uncertain; the alignment between the first 3 metatarsals and the associated cuneiforms is essentially normal although the space between the 21 medial cuneiform and the base of the second metatarsal is borderline accentuated for example on image 88 series 4 Small avulsion of the dorsal margin of the middle cuneiform, image 67 series 6. Plantar and Achilles calcaneal spurs. There is spurring along the distal tibial rim and chronic well corticated ossicles below the medial and lateral malleoli. Ligaments Suboptimally assessed by CT. Possible disruption of the anterior inferior tibiofibular ligament based on the associated calcifications as noted above. Muscles and Tendons Common peroneus tendon sheath tenosynovitis. Soft tissues Substantial  regional soft tissue swelling as expected given the fractures. Small tibiotalar joint effusion. IMPRESSION: 1. Complex fracture dislocation involving the Chopart joint and Lisfranc joint, with the dominant fracture dislocation plane extending through the talonavicular articulation and subsequently between the cuboid and lateral cuneiform, and finally between the cuboid and the bases of the fourth and fifth metatarsals. The calcaneocuboid articulation appears otherwise relatively intact, and the articulations between the cuneiforms and the first 3 metatarsals appear grossly intact although there is an avulsion fracture of the middle cuneiform dorsally, and the space between the medial cuneiform and the base of the second metatarsal where the Lisfranc ligament lies is upper normal. 2. Small calcifications along the expected location of the anterior inferior tibiofibular ligament may reflect syndesmotic injury. 3. Expected regional soft tissue swelling. Common peroneus tendon sheath tenosynovitis. Small tibiotalar joint effusion. Electronically Signed   By: Gaylyn Rong M.D.   On: 08/01/2021 07:41   DG Knee Complete 4 Views Right  Result Date: 08/01/2021 CLINICAL DATA:  61 year old male with history of trauma from a fall complaining of right knee pain. EXAM: RIGHT KNEE - COMPLETE 4+ VIEW COMPARISON:  No priors. FINDINGS: Four views of the right knee demonstrate an acute mildly displaced oblique fracture of the proximal third of the fibular diaphysis. Visualized portions of the distal femur and proximal tibia are intact, as is the patella. Mild joint space narrowing, subchondral sclerosis and osteophyte formation is noted in the knee joint, most severe in the patellofemoral compartment. IMPRESSION: 1. Oblique minimally displaced acute fracture of the proximal third of the fibular diaphysis. Electronically Signed   By: Trudie Reed M.D.   On: 08/01/2021 06:10   DG Foot Complete Right  Result Date:  08/01/2021 CLINICAL DATA:  Talonavicular and lateral Lisfranc joint fracture dislocation. Post reduction assessment. EXAM: RIGHT FOOT COMPLETE - 3+ VIEW COMPARISON:  08/01/2021 FINDINGS: A fiberglass splint is in place. There is continued abnormal inferomedial displacement of the talar head with respect to the navicular, best appreciated on the lateral projection. There is continued abnormal positioning of the cuboid particularly with respect to the lateral cuneiform and the bases of the fourth and fifth metatarsals. The longitudinal arch of the foot appears somewhat inverted. IMPRESSION: 1. Continued abnormal inferior displacement of the talar head with respect to the navicular, and abnormally widened articulation between the cuboid and the fourth and fifth metatarsals indicating cuboid malalignment. A fiberglass splint is in place. Electronically Signed   By: Gaylyn Rong M.D.   On: 08/01/2021 10:36   DG Foot Complete Right  Result Date: 08/01/2021 CLINICAL DATA:  Fall today, ankle pain the last 2  weeks. Known proximal fibular shaft fracture on radiography. EXAM: RIGHT FOOT COMPLETE - 3+ VIEW COMPARISON:  05/03/2021 FINDINGS: Fracture dislocation along the Chopart joint particularly notable at the talonavicular articulation with plantar deviation of the impacted and potentially flattened talar head, and dorsal avulsion fragments above the talonavicular articulation. This results in some reversal of the normal longitudinal arch of the foot. Exaggerated lateral displacement of the base of the fifth metatarsal with respect to the cuboid, raising the possibility of cuboid fracture or possible moderate cuboid displacement with injury along the Lisfranc joint and possibly the calcaneocuboid articulation. This raises the possibility of concomitant Lisfranc joint injury although currently I do not see substantial displacement between the medial cuneiform and the base of the second metatarsal. Dorsal spurring of  the medial cuneiform may be slightly displaced and potentially avulsed. Chronic ossicles along the anterior and posterior distal tibial rims. Tibiotalar joint effusion.  Soft tissue swelling along the ankle. Plantar and Achilles calcaneal spurs. New abnormal bony fragment noted medial to the talonavicular articulation. IMPRESSION: 1. Chopart joint fracture dislocation. Reversal of the normal longitudinal arch of the foot results. Suspected fractures of the distal talus and cuboid along with the navicular. Cannot exclude disruption of the Lisfranc joint along the articulation of the cuboid and bases of the fourth and fifth metatarsals. CT scan of the foot is recommended for further characterization. 2. Tibiotalar joint effusion. Electronically Signed   By: Gaylyn Rong M.D.   On: 08/01/2021 06:57    Intake/Output    None      ROS No recent fever, bleeding abnormalities, urologic dysfunction, GI problems, or weight gain. Blood pressure (!) 135/56, pulse 94, temperature 99.3 F (37.4 C), temperature source Oral, resp. rate 17, height 5\' 11"  (1.803 m), weight 104.7 kg, SpO2 100 %. Physical Exam NCAT, talkative and pleasant RLE Dressing intact, clean, dry  Edema/ swelling controlled  Sens: DPN, SPN, TN intact  Motor: EHL, FHL, and lessor toe ext and flex all intact grossly  Brisk cap refill, warm to touch  Assessment/Plan:  Right foot dislocation and impaction fracture Proximal fibular fracture Neuropathy  DM  I discussed with the patient the risks and benefits of surgery, including the possibility of infection, nerve injury, vessel injury, wound breakdown, arthritis, symptomatic hardware, DVT/ PE, loss of motion, malunion, nonunion, and need for further surgery among others.  We also specifically discussed the possible need to stage surgery because of the elevated risk of soft tissue breakdown that could lead to amputation as well as possibility of syndesmotic injury.  He acknowledged  these risks and wished to proceed.  Myrene Galas, MD [facility], [facility] [phone]  08/01/2021, 1:48 PM  [facility] [address] [place] [address] [phone] [phone] (F)    After 5pm and on the weekends please log on to [employer], go to orthopaedics and the look under the [facility] Call for the provider(s) on call. You can also call our office at [phone] and then follow the prompts to be connected to the call team.

## 2021-08-01 NOTE — Anesthesia Postprocedure Evaluation (Signed)
Anesthesia Post Note ? ?Patient: Robert Frey ? ?Procedure(s) Performed: OPEN REDUCTION INTERNAL FIXATION (ORIF) FOOT FRACTURE (Right) ? ?  ? ?Patient location during evaluation: PACU ?Anesthesia Type: General ?Level of consciousness: awake and alert ?Pain management: pain level controlled ?Vital Signs Assessment: post-procedure vital signs reviewed and stable ?Respiratory status: spontaneous breathing, nonlabored ventilation, respiratory function stable and patient connected to nasal cannula oxygen ?Cardiovascular status: blood pressure returned to baseline and stable ?Postop Assessment: no apparent nausea or vomiting ?Anesthetic complications: no ? ? ?No notable events documented. ? ?Last Vitals:  ?Vitals:  ? 08/01/21 1730 08/01/21 1815  ?BP: 138/73 115/60  ?Pulse: 91 96  ?Resp: 13 16  ?Temp:  (!) 36.1 ?C  ?SpO2: 98% 92%  ?  ?Last Pain:  ?Vitals:  ? 08/01/21 1720  ?TempSrc:   ?PainSc: 7   ? ? ?  ?  ?  ?  ?  ?  ? ?Cheree Fowles L Dangela How ? ? ? ? ?

## 2021-08-01 NOTE — ED Notes (Signed)
Phone Handoff report given to RN at Pre-OP at [place] ?

## 2021-08-01 NOTE — ED Notes (Signed)
ORDER FOR MODERATE SEDATION FROM ED RECEIVED ?CLIENT HAS WIFE HERE TO DRIVE HIM HOME ?

## 2021-08-01 NOTE — Discharge Instructions (Addendum)
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  Do not remove splint.  Activity as tolerated while maintaining weightbearing restrictions  Bone health: Labs show significant vitamin D deficiency.  Please take vitamin D supplements that have been prescribed for you.    Review the following resource for additional information regarding bone health  [email or web address]  Wound Care: Do not remove splint.  Do not get splint wet  DVT/PE prophylaxis: Xarelto 15 mg daily x 30 days  Diet: as you were eating previously.  Can use over the counter stool softeners and bowel preparations, such as Miralax, to help with bowel movements.  Narcotics can be constipating.  Be sure to drink plenty of fluids  PAIN MEDICATION USE AND EXPECTATIONS  You have likely been given narcotic medications to help control your pain.  After a traumatic event that results in an fracture (broken bone) with or without surgery, it is ok to use narcotic pain medications to help control one's pain.  We understand that everyone responds to pain differently and each individual patient will be evaluated on a regular basis for the continued need for narcotic medications. Ideally, narcotic medication use should last no more than 6-8 weeks (coinciding with fracture healing).   As a patient it is your responsibility as well to monitor narcotic medication use and report the amount and frequency you use these medications when you come to your office visit.   We would also advise that if you are using narcotic  medications, you should take a dose prior to therapy to maximize you participation.  IF YOU ARE ON NARCOTIC MEDICATIONS IT IS NOT PERMISSIBLE TO OPERATE A MOTOR VEHICLE (MOTORCYCLE/CAR/TRUCK/MOPED) OR HEAVY MACHINERY DO NOT MIX NARCOTICS WITH OTHER CNS (CENTRAL NERVOUS SYSTEM) DEPRESSANTS SUCH AS ALCOHOL   POST-OPERATIVE OPIOID TAPER INSTRUCTIONS: It is important to wean off of your opioid medication as soon as possible. If you do not need pain medication after your surgery it is ok to stop day one. Opioids include: Codeine, Hydrocodone(Norco, Vicodin), Oxycodone(Percocet, oxycontin) and hydromorphone amongst others.  Long term and even short term use of opiods can cause: Increased pain response Dependence Constipation Depression Respiratory depression And more.  Withdrawal symptoms can include Flu like symptoms Nausea, vomiting And more Techniques to manage these symptoms Hydrate well Eat regular healthy meals Stay active Use relaxation techniques(deep breathing, meditating, yoga) Do Not substitute Alcohol to help with tapering If you have been on opioids for less than two weeks and do not have pain than it is ok to stop all together.  Plan to wean off of opioids This plan should start within one week post op of your fracture surgery  Maintain the same interval or time between taking each dose and first decrease the dose.  Cut the total daily intake of opioids by one tablet each day Next start to increase the time between doses. The last dose that should be eliminated is the evening dose.    STOP SMOKING OR USING NICOTINE PRODUCTS!!!!  As discussed nicotine severely impairs your body's ability to heal surgical and traumatic wounds but also impairs bone healing.  Wounds and bone heal by forming microscopic blood vessels (angiogenesis) and  nicotine is a vasoconstrictor (essentially, shrinks blood vessels).  Therefore, if vasoconstriction occurs to these microscopic blood vessels  they essentially disappear and are unable to deliver necessary nutrients to the healing tissue.  This is one modifiable factor that you can do to dramatically increase your chances of healing your injury.    (This means no smoking, no nicotine gum, patches, etc)  DO NOT USE NONSTEROIDAL ANTI-INFLAMMATORY DRUGS (NSAID'S)  Using products such as Advil (ibuprofen), Aleve (naproxen), Motrin (ibuprofen) for additional pain control during fracture healing can delay and/or prevent the healing response.  If you would like to take over the counter (OTC) medication, Tylenol (acetaminophen) is ok.  However, some narcotic medications that are given for pain control contain acetaminophen as well. Therefore, you should not exceed more than 4000 mg of tylenol in a day if you do not have liver disease.  Also note that there are may OTC medicines, such as cold medicines and allergy medicines that my contain tylenol as well.  If you have any questions about medications and/or interactions please ask your doctor/PA or your pharmacist.      ICE AND ELEVATE INJURED/OPERATIVE EXTREMITY  Using ice and elevating the injured extremity above your heart can help with swelling and pain control.  Icing in a pulsatile fashion, such as 20 minutes on and 20 minutes off, can be followed.    Do not place ice directly on skin. Make sure there is a barrier between to skin and the ice pack.    Using frozen items such as frozen peas works well as the conform nicely to the are that needs to be iced.  USE AN ACE WRAP OR TED HOSE FOR SWELLING CONTROL  In addition to icing and elevation, Ace wraps or TED hose are used to help limit and resolve swelling.  It is recommended to use Ace wraps or TED hose until you are informed to stop.    When using Ace Wraps start the wrapping distally (farthest away from the body) and wrap proximally (closer to the body)   Example: If you had surgery on your leg or thing and you do not have a splint on, start  the ace wrap at the toes and work your way up to the thigh        If you had surgery on your upper extremity and do not have a splint on, start the ace wrap at your fingers and work your way up to the upper arm  IF YOU ARE IN A SPLINT OR CAST DO NOT [place]   If your splint gets wet for any reason please contact the office immediately. You may shower in your splint or cast as long as you keep it dry.  This can be done by wrapping in a cast cover or garbage back (or similar)  Do Not stick any thing down your splint or cast such as pencils, money, or hangers to try and scratch yourself with.  If you feel itchy take benadryl as prescribed on the bottle for itching  IF YOU ARE IN A CAM BOOT (BLACK BOOT)  You may remove boot periodically. Perform daily dressing changes as noted below.  Wash the liner of the boot regularly and wear a sock when wearing the boot. It is recommended that you sleep in the boot until told otherwise    Call office for the following: Temperature greater than 101F Persistent nausea and vomiting Severe uncontrolled pain Redness, tenderness, or signs of  infection (pain, swelling, redness, odor or green/yellow discharge around the site) Difficulty breathing, headache or visual disturbances Hives Persistent dizziness or light-headedness Extreme fatigue Any other questions or concerns you may have after discharge  In an emergency, call 911 or go to an Emergency Department at a nearby hospital  HELPFUL INFORMATION  If you had a block, it will wear off between 8-24 hrs postop typically.  This is period when your pain may go from nearly zero to the pain you would have had postop without the block.  This is an abrupt transition but nothing dangerous is happening.  You may take an extra dose of narcotic when this happens.  You should wean off your narcotic medicines as soon as you are able.  Most patients will be off or using minimal narcotics before their first  postop appointment.   We suggest you use the pain medication the first night prior to going to bed, in order to ease any pain when the anesthesia wears off. You should avoid taking pain medications on an empty stomach as it will make you nauseous.  Do not drink alcoholic beverages or take illicit drugs when taking pain medications.  In most states it is against the law to drive while you are in a splint or sling.  And certainly against the law to drive while taking narcotics.  You may return to work/school in the next couple of days when you feel up to it.   Pain medication may make you constipated.  Below are a few solutions to try in this order: Decrease the amount of pain medication if you arent having pain. Drink lots of decaffeinated fluids. Drink prune juice and/or each dried prunes  If the first 3 dont work start with additional solutions Take Colace - an over-the-counter stool softener Take Senokot - an over-the-counter laxative Take Miralax - a stronger over-the-counter laxative     CALL THE OFFICE WITH ANY QUESTIONS OR CONCERNS: [phone]   VISIT OUR WEBSITE FOR ADDITIONAL INFORMATION: [email or web address]

## 2021-08-01 NOTE — Sedation Documentation (Signed)
Ortho splint being applied by Nando-EMT with ED MD ?

## 2021-08-01 NOTE — ED Notes (Signed)
Pt AAOx4, Respirations even and unlabored. Reports no distress, Wife at bedside. Denies any needs at this time. Call bell within reach ?

## 2021-08-01 NOTE — ED Provider Notes (Signed)
[facility] EMERGENCY DEPARTMENT Provider Note   CSN: [id number] Arrival date & time: 08/01/21  0531     History  Chief Complaint  Patient presents with   Leg Pain    Robert Frey is a 61 y.o. male.  The history is provided by the patient and a relative.  Leg Pain Location:  Knee Injury: yes   Mechanism of injury: fall   Knee location:  R knee Pain details:    Quality:  Aching   Severity:  Moderate   Onset quality:  Sudden   Duration:  3 hours   Timing:  Constant   Progression:  Worsening Chronicity:  New Relieved by:  Elevation Exacerbated by: movement. Associated symptoms: no back pain, no fever and no neck pain   Patient reports he fell about 3 hours ago.  Patient reports he has had a recent pain and swelling of the right ankle.  He reports earlier tonight he was walking with socks on and due to the pain in his right ankle he slipped and fell injuring his right knee.  No head injury or LOC.  No new back or neck pain.  No hip pain. He reports he has had chronic swelling and pain in the right ankle    Home Medications Prior to Admission medications   Medication Sig Start Date End Date Taking? Authorizing Provider  oxyCODONE-acetaminophen (PERCOCET/ROXICET) 5-325 MG tablet Take 1 tablet by mouth every 6 (six) hours as needed for severe pain. 08/01/21  Yes Ripley Fraise, MD  amLODipine (NORVASC) 5 MG tablet Take 1 tablet (5 mg total) by mouth daily. 11/09/20   Breeback, Jade L, PA-C  Apremilast (OTEZLA) 30 MG TABS Take 1 tablet (30 mg total) by mouth in the morning and at bedtime. 07/18/21   Breeback, Jade L, PA-C  atorvastatin (LIPITOR) 80 MG tablet Take 1 tablet (80 mg total) by mouth daily. 11/09/20   Breeback, Jade L, PA-C  clobetasol cream (TEMOVATE) 5.85 % Apply 1 application topically 2 (two) times daily. Patient taking differently: Apply 1 application topically daily as needed. 01/03/19   Breeback, Jade L, PA-C  Dapagliflozin-metFORMIN HCl ER (XIGDUO XR)  02-999 MG TB24 Take 1 tablet by mouth daily. 06/01/21   Breeback, Jade L, PA-C  Evolocumab (REPATHA) 140 MG/ML SOSY Inject 140 mg into the skin every 14 (fourteen) days. 11/09/20   Breeback, Jade L, PA-C  furosemide (LASIX) 20 MG tablet Take 1 tablet (20 mg total) by mouth daily. 07/26/21   Breeback, Royetta Car, PA-C  hydrochlorothiazide (HYDRODIURIL) 25 MG tablet Take 1 tablet (25 mg total) by mouth daily. 11/09/20   Breeback, Royetta Car, PA-C  icosapent Ethyl (VASCEPA) 1 g capsule Take 2 capsules (2 g total) by mouth 2 (two) times daily. 07/07/21   Breeback, Jade L, PA-C  imipramine (TOFRANIL) 50 MG tablet TAKE 4 TABLETS (200 MG TOTAL) BY MOUTH AT BEDTIME. 12/21/20   Breeback, Jade L, PA-C  insulin glargine, 2 Unit Dial, (TOUJEO MAX SOLOSTAR) 300 UNIT/ML Solostar Pen INJECT 25 UNITS AT BEDTIME. INCREASE BY 2 UNITS DAILY UNTIL FASTING SUGARS ARE BETWEEN 90-130 07/26/21   Breeback, Jade L, PA-C  Insulin Pen Needle (PEN NEEDLES) 32G X 4 MM MISC Inject 1 Units into the skin daily. 11/06/16   Emeterio Reeve, DO  L-Methylfolate-Algae-B12-B6 Lebron Quam RF) 3-90.314-2-35 MG CAPS Take 1 tablet by mouth in the morning and at bedtime. 01/28/20   Breeback, Jade L, PA-C  olmesartan (BENICAR) 20 MG tablet Take 1 tablet (20 mg total)  by mouth daily. 06/10/21   Breeback, Jade L, PA-C  pregabalin (LYRICA) 75 MG capsule Take 1 capsule (75 mg total) by mouth 2 (two) times daily. 06/01/21   Breeback, Jade L, PA-C  primidone (MYSOLINE) 50 MG tablet TAKE 1 TABLET BY MOUTH EVERY DAY 11/09/20   Breeback, Jade L, PA-C  Semaglutide, 2 MG/DOSE, 8 MG/3ML SOPN Inject 2 mg as directed once a week. 06/01/21   Breeback, Jade L, PA-C  triamcinolone 0.5%-Eucerin equivalent 1:1 cream mixture Apply topically 2 (two) times daily. Mix 0.5% triamcinolone cream and Eucerin in a one-to-one mixture. 06/28/21   Breeback, Royetta Car, PA-C      Allergies    Azithromycin, Green dyes, Propranolol, and Zithromax [azithromycin dihydrate]    Review of Systems   Review of  Systems  Constitutional:  Negative for fever.  Musculoskeletal:  Positive for arthralgias and joint swelling. Negative for back pain and neck pain.   Physical Exam Updated Vital Signs BP (!) 119/55 (BP Location: Left Arm)    Pulse 96    Temp 98 F (36.7 C) (Oral)    Resp 20    Ht 1.803 m ('5\' 11"'$ )    Wt 101.2 kg    SpO2 99%    BMI 31.10 kg/m  Physical Exam CONSTITUTIONAL: Well developed/well nourished, uncomfortable appearing HEAD: Normocephalic/atraumatic EYES: EOMI ENMT: Mucous membranes moist, mask in place NECK: supple no meningeal signs SPINE/BACK:entire spine nontender LUNGS:  no apparent distress ABDOMEN: soft, nontender NEURO: Pt is awake/alert/appropriate, moves all extremitiesx4.  No facial droop.  GCS 15. No sensory deficit to right LE.  He is able to plantar/dorsi flex right foot but limited to edema/pain. EXTREMITIES: Left DP pulse found, right PT pulse found.  Both feet are warm to touch.  The right foot and ankle have significant edema.  Abrasion noted to the right knee.  No deep lacerations are noted.  Diffuse tenderness is noted to the right knee. There is no tenderness with range of motion of right hip, no tenderness to palpation of right hip. Compartments are soft in the right LE All other extremities/joints palpated/ranged and nontender SKIN: warm, color normal PSYCH: Mildly anxious  ED Results / Procedures / Treatments   Labs (all labs ordered are listed, but only abnormal results are displayed) Labs Reviewed - No data to display  EKG None  Radiology DG Tibia/Fibula Right  Result Date: 08/01/2021 CLINICAL DATA:  61 year old male with history of trauma from a fall. Right leg pain. EXAM: RIGHT TIBIA AND FIBULA - 2 VIEW COMPARISON:  Right knee radiograph 08/01/2021. FINDINGS: Again noted is an acute minimally displaced oblique fracture of the proximal third of the fibular diaphysis. Tibia is intact. IMPRESSION: 1. Acute minimally displaced oblique fracture of the  proximal third of the fibular diaphysis. Electronically Signed   By: [name] M.D.   On: 08/01/2021 06:28   DG Knee Complete 4 Views Right  Result Date: 08/01/2021 CLINICAL DATA:  61 year old male with history of trauma from a fall complaining of right knee pain. EXAM: RIGHT KNEE - COMPLETE 4+ VIEW COMPARISON:  No priors. FINDINGS: Four views of the right knee demonstrate an acute mildly displaced oblique fracture of the proximal third of the fibular diaphysis. Visualized portions of the distal femur and proximal tibia are intact, as is the patella. Mild joint space narrowing, subchondral sclerosis and osteophyte formation is noted in the knee joint, most severe in the patellofemoral compartment. IMPRESSION: 1. Oblique minimally displaced acute fracture of the proximal third of  the fibular diaphysis. Electronically Signed   By: [name] M.D.   On: 08/01/2021 06:10   DG Foot Complete Right  Result Date: 08/01/2021 CLINICAL DATA:  Fall today, ankle pain the last 2 weeks. Known proximal fibular shaft fracture on radiography. EXAM: RIGHT FOOT COMPLETE - 3+ VIEW COMPARISON:  05/03/2021 FINDINGS: Fracture dislocation along the Chopart joint particularly notable at the talonavicular articulation with plantar deviation of the impacted and potentially flattened talar head, and dorsal avulsion fragments above the talonavicular articulation. This results in some reversal of the normal longitudinal arch of the foot. Exaggerated lateral displacement of the base of the fifth metatarsal with respect to the cuboid, raising the possibility of cuboid fracture or possible moderate cuboid displacement with injury along the Lisfranc joint and possibly the calcaneocuboid articulation. This raises the possibility of concomitant Lisfranc joint injury although currently I do not see substantial displacement between the medial cuneiform and the base of the second metatarsal. Dorsal spurring of the medial cuneiform may  be slightly displaced and potentially avulsed. Chronic ossicles along the anterior and posterior distal tibial rims. Tibiotalar joint effusion.  Soft tissue swelling along the ankle. Plantar and Achilles calcaneal spurs. New abnormal bony fragment noted medial to the talonavicular articulation. IMPRESSION: 1. Chopart joint fracture dislocation. Reversal of the normal longitudinal arch of the foot results. Suspected fractures of the distal talus and cuboid along with the navicular. Cannot exclude disruption of the Lisfranc joint along the articulation of the cuboid and bases of the fourth and fifth metatarsals. CT scan of the foot is recommended for further characterization. 2. Tibiotalar joint effusion. Electronically Signed   By: Van Clines M.D.   On: 08/01/2021 06:57    Procedures .Ortho Injury Treatment  Date/Time: 08/01/2021 6:34 AM Performed by: Ripley Fraise, MD Authorized by: Ripley Fraise, MD   Consent:    Consent obtained:  Verbal   Consent given by:  Patient   Alternatives discussed:  No treatmentInjury location: lower leg Location details: right lower leg Injury type: fracture Fracture type: proximal fibula Pre-procedure neurovascular assessment: neurovascularly intact Pre-procedure distal perfusion: normal Pre-procedure neurological function: normal Pre-procedure range of motion: reduced  Anesthesia: Local anesthesia used: no  Patient sedated: NoManipulation performed: no Immobilization: Knee immobilizer. Splint Applied by: ED Tech Post-procedure neurovascular assessment: post-procedure neurovascularly intact Post-procedure distal perfusion: normal Post-procedure neurological function: normal Post-procedure range of motion: unchanged      Medications Ordered in ED Medications  HYDROmorphone (DILAUDID) injection 1 mg (1 mg Intramuscular Given 08/01/21 2505)    ED Course/ Medical Decision Making/ A&P Clinical Course as of 08/01/21 0700  Mon Aug 01, 2021   0617 Patient has had a recent pain and swelling in the right ankle and had outpatient work-up.  He has already had a negative DVT study.  He is already been trialed on prednisone.  However tonight he has an acute injury to the right knee. [DW]  3976 Discussed with radiology.  Patient appears to have new injuries to his right foot, but would recommend CT of the foot to determine acuity of injuries.  Patient and family updated on plan [DW]  0659 Signed out to Dr. Dr. Roslynn Amble at shift change to f/u  on CT imaging and arrange followup [DW]    Clinical Course User Index [DW] Ripley Fraise, MD                           Medical Decision Making Amount and/or Complexity  of Data Reviewed Radiology: ordered.  Risk Prescription drug management.   This patient presents to the ED for concern of right leg pain, this involves an extensive number of treatment options, and is a complaint that carries with it a high risk of complications and morbidity.  The differential diagnosis includes patellar fracture, tibial plateau fracture, proximal fibular fracture, knee sprain  Comorbidities that complicate the patient evaluation: Patients presentation is complicated by their history of diabetes  Additional history obtained: Additional history obtained from family Records reviewed Primary Care Documents  Imaging Studies ordered: I ordered imaging studies including X-ray right knee/tib-fib/foot x-ray   I independently visualized and interpreted imaging which showed proximal fibula fracture I agree with the radiologist interpretation  Medicines ordered and prescription drug management: I ordered medication including parental Dilaudid for leg pain Reevaluation of the patient after these medicines showed that the patient    improved  After the interventions noted above, I reevaluated the patient and found that they have :improved  Complexity of problems addressed: Patients presentation is most  consistent with  acute illness/injury with systemic symptoms  Disposition: After consideration of the diagnostic results and the patients response to treatment,  I feel that the patent would benefit from discharge   .           Final Clinical Impression(s) / ED Diagnoses Final diagnoses:  Other closed fracture of proximal end of right fibula, initial encounter    Rx / DC Orders ED Discharge Orders          Ordered    oxyCODONE-acetaminophen (PERCOCET/ROXICET) 5-325 MG tablet  Every 6 hours PRN        08/01/21 6203              Ripley Fraise, MD 08/01/21 0700

## 2021-08-01 NOTE — ED Provider Notes (Addendum)
Signout note ? ?61 year old gentleman presenting to ER after ground-level fall.  Having knee pain and right ankle and foot pain.  Found to have proximal right proximal fibula fracture as well as . Chopart joint fracture dislocation. Reversal of the normal  ?longitudinal arch of the foot results. Suspected fractures of the distal talus and cuboid along with the navicular.  ? ?7:00 AM received sign out from [place] - f/u on CT, call ortho  ? ?CT showing ? ?IMPRESSION:  ?1. Complex fracture dislocation involving the Chopart joint and Lisfranc joint, with the dominant fracture dislocation plane extending through the talonavicular articulation and subsequently between the cuboid and lateral cuneiform, and finally between the cuboid and the bases of the fourth and fifth metatarsals. The calcaneocuboid articulation appears otherwise relatively intact, and  ?the articulations between the cuneiforms and the first 3 metatarsals appear grossly intact although there is an avulsion fracture of the middle cuneiform dorsally, and the space between the medial cuneiform and the base of the second metatarsal where the Lisfranc ligament lies is upper normal. 2. Small calcifications along the expected location of the anterior inferior tibiofibular ligament may reflect syndesmotic injury. 3. Expected regional soft tissue swelling. Common peroneus tendon sheath tenosynovitis. Small tibiotalar joint effusion.  ? ? ?I discussed these findings with Dr. Lynann Bologna on-call for orthopedic surgery.  He has reviewed with Dr. Lucia Gaskins their foot and ankle specialist.  He has requested that I attempt reduction in ER.  I discussed risks and benefits with patient and wife.  Discussed potential for need of transfer if unsuccessful.  Would like to give it a try here.  They consented to sedation and reduction attempt in ER this morning.  See below for procedure details.  Postreduction films demonstrate continued abnormal inferior displacement of the talar  head with respect to the navicular and abnormal line articulation between the cuboid and fourth and fifth metatarsals concerning for cuboid malalignment.  I discussed these findings with Dr. Lynann Bologna who was reviewed again with Dr. Lucia Gaskins.  There is some improvement in alignment but given this persistent displacement, they request that patient be transferred ER to ER to [facility].  They will make Hilbert Odor aware.  I have discussed the case with Dr. Tomi Bamberger.  Patient will go by [facility].   ? ?When patient arrives to [facility], please notify of Hilbert Odor. ? ? ?ADDENDUM - notified by [facility] - they will take patient straight to OR. Request he be taken to short stay/pre op. Legrand Como ER RN calling pre op to give report.  ? ?.Ortho Injury Treatment ? ?Date/Time: 08/01/2021 11:03 AM ?Performed by: Lucrezia Starch, MD ?Authorized by: Lucrezia Starch, MD  ? ?Consent:  ?  Consent obtained:  Verbal and written ?  Consent given by:  Patient ?  Risks discussed:  Fracture, irreducible dislocation, recurrent dislocation, stiffness, restricted joint movement, nerve damage and vascular damage ?  Alternatives discussed:  No treatment, alternative treatment, immobilization, referral and delayed treatmentInjury location: foot ?Location details: right foot ?Injury type: fracture-dislocation ?Fracture type: talar and navicular ?Pre-procedure distal perfusion: normal ?Pre-procedure neurological function: normal ?Pre-procedure range of motion: reduced ? ?Anesthesia: ?Local anesthesia used: no ? ?Patient sedated: Yes. Refer to sedation procedure documentation for details of sedation. ?Manipulation performed: yes ?Skin traction used: no ?Skeletal traction used: yes ?Reduction successful: no ?X-ray confirmed reduction: yes ?Immobilization: splint ?Splint type: long leg ?Splint Applied by: ED Provider and ED Tech ?Supplies used: cotton padding and Ortho-Glass ?Post-procedure neurovascular assessment: post-procedure  neurovascularly intact ?Post-procedure distal perfusion: normal ?Post-procedure neurological function: normal ?Post-procedure range of motion: unchanged ?Comments:  ? ? ? ? ?Marland KitchenSedation ? ?Date/Time: 08/01/2021 11:04 AM ?Performed by: Lucrezia Starch, MD ?Authorized by: Lucrezia Starch, MD  ? ?Consent:  ?  Consent obtained:  Verbal ?  Risks discussed:  Allergic reaction, dysrhythmia, inadequate sedation, nausea, vomiting, respiratory compromise necessitating ventilatory assistance and intubation, prolonged sedation necessitating reversal and prolonged hypoxia resulting in organ damage ?  Alternatives discussed:  Analgesia without sedation, anxiolysis and regional anesthesia ?Universal protocol:  ?  Immediately prior to procedure, a time out was called: yes   ?Pre-sedation assessment:  ?  Time since last food or drink:  Greater than 4 hours ?  ASA classification: class 2 - patient with mild systemic disease   ?  Mouth opening:  3 or more finger widths ?  Thyromental distance:  3 finger widths ?  Mallampati score:  II - soft palate, uvula, fauces visible ?  Neck mobility: normal   ?  Pre-sedation assessments completed and reviewed: airway patency, cardiovascular function, hydration status, mental status, nausea/vomiting, pain level, respiratory function and temperature   ?Immediate pre-procedure details:  ?  Reviewed: vital signs   ?  Verified: bag valve mask available, emergency equipment available, intubation equipment available, IV patency confirmed, oxygen available, reversal medications available and suction available   ?Procedure details (see MAR for exact dosages):  ?  Preoxygenation:  Nasal cannula ?  Sedation:  Propofol ?  Intended level of sedation: deep ?  Analgesia:  Hydromorphone ?  Intra-procedure monitoring:  Blood pressure monitoring, cardiac monitor, continuous capnometry, frequent LOC assessments, frequent vital sign checks and continuous pulse oximetry ?  Intra-procedure events comment:  Brief  hypoxia though no change in end tidal and poor wave form ?  Intra-procedure management:  Supplemental oxygen ?  Total Provider sedation time (minutes):  12 ?Post-procedure details:  ?  Attendance: Constant attendance by certified staff until patient recovered   ?  Recovery: Patient returned to pre-procedure baseline   ?  Post-sedation assessments completed and reviewed: airway patency, cardiovascular function, hydration status, mental status, nausea/vomiting, pain level, respiratory function and temperature   ?  Patient is stable for discharge or admission: yes   ?  Procedure completion:  Tolerated well, no immediate complications ?Comments:  ?   There was brief (few seconds) drop in spo2 to high 80s however poor wave form and patient's end tidal CO2 remained completely normal with normal wave form though this.  Breathing spontaneously without difficulty.  After adjustment in pulse oximeter, it read high 90s. ? ? ?  ?Lucrezia Starch, MD ?08/01/21 1108 ? ?  ?Lucrezia Starch, MD ?08/01/21 1129 ? ?

## 2021-08-01 NOTE — ED Notes (Addendum)
End Tidal CO2 measurement during conscious sedation ranged from 30- 40. Placed PT on 2 lpm due to Spo2 <=92% (waveform poor)- resulted in Sp02 increased to 97% on 2 LPM (good waveform- changed hands for pulse ox monitoring). Post procedure placed PT on RA Sp02 95-96%. PT is now awake and conversing (no respiratory distress noted at this time)- removed end tidal co2 measuring system per RN request. ?

## 2021-08-01 NOTE — Sedation Documentation (Signed)
Portable rt foot x-ray requested per ED MD ?

## 2021-08-01 NOTE — ED Notes (Signed)
IVF OF NS INFUSING AT [facility] FOR MODERATE SEDATION ?ACTUAL WT IN KG 104.7 ?NPO 0600HRS ?

## 2021-08-02 ENCOUNTER — Encounter (HOSPITAL_COMMUNITY): Payer: Self-pay | Admitting: Orthopedic Surgery

## 2021-08-02 ENCOUNTER — Ambulatory Visit: Payer: Commercial Managed Care - PPO | Admitting: Physician Assistant

## 2021-08-02 LAB — BASIC METABOLIC PANEL
Anion gap: 12 (ref 5–15)
BUN: 27 mg/dL — ABNORMAL HIGH (ref 6–20)
CO2: 24 mmol/L (ref 22–32)
Calcium: 9.6 mg/dL (ref 8.9–10.3)
Chloride: 91 mmol/L — ABNORMAL LOW (ref 98–111)
Creatinine, Ser: 1.27 mg/dL — ABNORMAL HIGH (ref 0.61–1.24)
GFR, Estimated: >60 mL/min (ref >60)
Glucose, Bld: 161 mg/dL — ABNORMAL HIGH (ref 70–99)
Potassium: 4.2 mmol/L (ref 3.5–5.1)
Sodium: 127 mmol/L — ABNORMAL LOW (ref 135–145)

## 2021-08-02 LAB — GLUCOSE, CAPILLARY
Glucose-Capillary: 141 mg/dL — ABNORMAL HIGH (ref 70–99)
Glucose-Capillary: 167 mg/dL — ABNORMAL HIGH (ref 70–99)
Glucose-Capillary: 172 mg/dL — ABNORMAL HIGH (ref 70–99)
Glucose-Capillary: 178 mg/dL — ABNORMAL HIGH (ref 70–99)
Glucose-Capillary: 183 mg/dL — ABNORMAL HIGH (ref 70–99)
Glucose-Capillary: 243 mg/dL — ABNORMAL HIGH (ref 70–99)

## 2021-08-02 LAB — VITAMIN D 25 HYDROXY (VIT D DEFICIENCY, FRACTURES): Vit D, 25-Hydroxy: 9.74 ng/mL — ABNORMAL LOW (ref 30–100)

## 2021-08-02 MED ORDER — DIPHENHYDRAMINE HCL 25 MG PO CAPS
50.0000 mg | ORAL_CAPSULE | Freq: Once | ORAL | Status: AC | PRN
  Administered 2021-08-02: 50 mg via ORAL
  Filled 2021-08-02: qty 2

## 2021-08-02 MED ORDER — ZINC SULFATE 220 (50 ZN) MG PO CAPS
220.0000 mg | ORAL_CAPSULE | Freq: Every day | ORAL | Status: DC
  Administered 2021-08-02 – 2021-08-03 (×2): 220 mg via ORAL
  Filled 2021-08-02 (×2): qty 1

## 2021-08-02 MED ORDER — INSULIN GLARGINE-YFGN 100 UNIT/ML ~~LOC~~ SOLN
28.0000 [IU] | Freq: Two times a day (BID) | SUBCUTANEOUS | Status: DC
  Administered 2021-08-02: 28 [IU] via SUBCUTANEOUS
  Filled 2021-08-02 (×4): qty 0.28

## 2021-08-02 MED ORDER — INSULIN GLARGINE-YFGN 100 UNIT/ML ~~LOC~~ SOLN
14.0000 [IU] | Freq: Two times a day (BID) | SUBCUTANEOUS | Status: DC
  Administered 2021-08-02 – 2021-08-03 (×2): 14 [IU] via SUBCUTANEOUS
  Filled 2021-08-02 (×3): qty 0.14

## 2021-08-02 MED ORDER — VITAMIN D (ERGOCALCIFEROL) 1.25 MG (50000 UNIT) PO CAPS
50000.0000 [IU] | ORAL_CAPSULE | ORAL | Status: DC
  Administered 2021-08-02: 50000 [IU] via ORAL
  Filled 2021-08-02: qty 1

## 2021-08-02 NOTE — Progress Notes (Signed)
Inpatient Diabetes Program Recommendations ? ?AACE/ADA: New Consensus Statement on Inpatient Glycemic Control (2015) ? ?Target Ranges:  Prepandial:   less than 140 mg/dL ?     Peak postprandial:   less than 180 mg/dL (1-2 hours) ?     Critically ill patients:  140 - 180 mg/dL  ? ?Lab Results  ?Component Value Date  ? GLUCAP 243 (H) 08/02/2021  ? HGBA1C 7.4 (A) 06/28/2021  ? ? ?Review of Glycemic Control ? Latest Reference Range & Units 08/01/21 21:33 08/01/21 23:51 08/02/21 03:57 08/02/21 09:00  ?Glucose-Capillary 70 - 99 mg/dL 245 (H) 211 (H) 178 (H) 243 (H)  ?(H): Data is abnormally high ?Diabetes history: Type 2 DM ?Outpatient Diabetes medications: Xigduo 02-999 mg QD, Ozempic 1 mg qwk, Toujeo 14 units BID ?Current orders for Inpatient glycemic control: Semglee 14 units BID, Novolog 4 units TID, Novolog 0-15 units TID ?Decadron 5 mg x 1 ? ?Inpatient Diabetes Program Recommendations:   ? ?Noted consult. In agreement with current orders. Decadron given thus partially explaining increase to glucose trends. Secure chat sent to PA. ? ?Thanks, ?Bronson Curb, MSN, RNC-OB ?Diabetes Coordinator ?[phone] (8a-5p) ? ? ? ? ?

## 2021-08-02 NOTE — Evaluation (Signed)
Occupational Therapy Evaluation Patient Details Name: Robert Frey MRN: [id number] DOB: Jan 19, 1961 Today's Date: 08/02/2021   History of Present Illness Pt is 61 yo male with ground level fall with R ankle and knee pain. CT showed complex fx involving Lisfranc and Chopart joint. Also with proximal fibular fx. Underwent  R foot ORIF 08/01/21.  PMH: ankylosing spondylitis, COPD, DM, HT   Clinical Impression   Pt typically independent in ADL and mobility. Today Pt is mod A for transfers with RW (max verbal cues for sequencing and safety - min physical assist) He is max A for LB ADL  - educated to dress RLE first. He requires a seated position for grooming/bathing/dressing where he would normally stand. Pt presented with cognitive differences from normal today - impulsive, tangential, decreased safety awareness. PT's wife states that he is not normally like this and attributed to glucose levels being "off" Will benefit from skilled OT in the acute setting but not recommending OT follow up. Next session plan to take AE for demo/education.        Recommendations for follow up therapy are one component of a multi-disciplinary discharge planning process, led by the attending physician.  Recommendations may be updated based on patient status, additional functional criteria and insurance authorization.   Follow Up Recommendations  No OT follow up    Assistance Recommended at Discharge Intermittent Supervision/Assistance  Patient can return home with the following A little help with walking and/or transfers;A lot of help with bathing/dressing/bathroom;Assistance with cooking/housework;Assist for transportation;Help with stairs or ramp for entrance    Functional Status Assessment  Patient has had a recent decline in their functional status and demonstrates the ability to make significant improvements in function in a reasonable and predictable amount of time.  Equipment Recommendations  BSC/3in1     Recommendations for Other Services PT consult     Precautions / Restrictions Precautions Precautions: Fall Precaution Comments: impulsive Required Braces or Orthoses: Splint/Cast Splint/Cast: RLE Restrictions Weight Bearing Restrictions: Yes RLE Weight Bearing: Non weight bearing      Mobility Bed Mobility Overal bed mobility: Needs Assistance Bed Mobility: Supine to Sit     Supine to sit: Supervision     General bed mobility comments: supervision for line safety    Transfers Overall transfer level: Needs assistance Equipment used: Rolling walker (2 wheels) Transfers: Sit to/from Stand Sit to Stand: Mod assist           General transfer comment: max verbal cues and min physical assist      Balance Overall balance assessment: Needs assistance Sitting-balance support: No upper extremity supported, Feet supported Sitting balance-Leahy Scale: Good     Standing balance support: Bilateral upper extremity supported, During functional activity, Reliant on assistive device for balance Standing balance-Leahy Scale: Poor Standing balance comment: decreased safety awareness in standing for WB precautions                           ADL either performed or assessed with clinical judgement   ADL Overall ADL's : Needs assistance/impaired Eating/Feeding: Independent   Grooming: Set up;Sitting Grooming Details (indicate cue type and reason): in recliner Upper Body Bathing: Set up;Sitting   Lower Body Bathing: Set up;With adaptive equipment;Sitting/lateral leans   Upper Body Dressing : Set up;Sitting   Lower Body Dressing: Maximal assistance;Sitting/lateral leans Lower Body Dressing Details (indicate cue type and reason): needs assist to reach down to RLE - will need AE education Toilet Transfer:  Moderate assistance;Ambulation;Rolling walker (2 wheels) (MAX verbal cues and min physical assist) Toilet Transfer Details (indicate cue type and reason): very  unsafe with RW Toileting- Clothing Manipulation and Hygiene: Min guard;Sitting/lateral lean       Functional mobility during ADLs: Moderate assistance;Rolling walker (2 wheels);Cueing for safety;Cueing for sequencing (max VC and min physical assist)       Vision Baseline Vision/History: 1 Wears glasses Ability to See in Adequate Light: 0 Adequate Patient Visual Report: No change from baseline Vision Assessment?: No apparent visual deficits     Perception     Praxis      Pertinent Vitals/Pain       Hand Dominance Left   Extremity/Trunk Assessment Upper Extremity Assessment Upper Extremity Assessment: Defer to OT evaluation   Lower Extremity Assessment Lower Extremity Assessment: RLE deficits/detail   Cervical / Trunk Assessment Cervical / Trunk Assessment: Normal   Communication Communication Communication: No difficulties   Cognition                                             General Comments  wife present throughout session. She states Pt more "ADD" due to glucose levels being off.    Exercises     Shoulder Instructions      Home Living Family/patient expects to be discharged to:: Private residence Living Arrangements: Spouse/significant other;Children Available Help at Discharge: Family;Available PRN/intermittently Type of Home: House Home Access: Stairs to enter [employer] of Steps: 1 Entrance Stairs-Rails: None Home Layout: Two level;Able to live on main level with bedroom/bathroom Alternate Level Stairs-Number of Steps: flight   Bathroom Shower/Tub: Teacher, early years/pre: Standard Bathroom Accessibility: Yes   Home Equipment: Crutches;Cane - single point          Prior Functioning/Environment Prior Level of Function : Independent/Modified Independent;Working/employed;Driving             Mobility Comments: computer work, drives one hr each way          OT Problem List: Decreased activity  tolerance;Impaired balance (sitting and/or standing);Decreased safety awareness;Decreased knowledge of use of DME or AE;Decreased knowledge of precautions;Pain      OT Treatment/Interventions: Self-care/ADL training;DME and/or AE instruction;Therapeutic activities;Patient/family education;Balance training    OT Goals(Current goals can be found in the care plan section) Acute Rehab OT Goals Patient Stated Goal: get home and get better OT Goal Formulation: With patient Time For Goal Achievement: 08/16/21 Potential to Achieve Goals: Good  OT Frequency: Min 2X/week    Co-evaluation PT/OT/SLP Co-Evaluation/Treatment: Yes Reason for Co-Treatment: For patient/therapist safety;To address functional/ADL transfers (intial evaluation safety) PT goals addressed during session: Mobility/safety with mobility;Balance;Proper use of DME OT goals addressed during session: ADL's and self-care;Proper use of Adaptive equipment and DME      AM-PAC OT "6 Clicks" Daily Activity     Outcome Measure Help from another person eating meals?: None Help from another person taking care of personal grooming?: A Little Help from another person toileting, which includes using toliet, bedpan, or urinal?: A Little Help from another person bathing (including washing, rinsing, drying)?: A Little Help from another person to put on and taking off regular upper body clothing?: A Little Help from another person to put on and taking off regular lower body clothing?: A Lot 6 Click Score: 18   End of Session Equipment Utilized During Treatment: Rolling walker (2 wheels);Gait  belt Nurse Communication: Mobility status;Precautions;Weight bearing status  Activity Tolerance: Patient tolerated treatment well Patient left: in chair;with call bell/phone within reach;with bed alarm set;with family/visitor present;with nursing/sitter in room  OT Visit Diagnosis: Unsteadiness on feet (R26.81);Other abnormalities of gait and mobility  (R26.89);Muscle weakness (generalized) (M62.81);Other symptoms and signs involving cognitive function;Pain Pain - Right/Left: Right Pain - part of body: Knee;Ankle and joints of foot                Time: 3143-8887 OT Time Calculation (min): 25 min Charges:  OT General Charges $OT Visit: 1 Visit OT Evaluation $OT Eval Moderate Complexity: Waves OTR/L Acute Rehabilitation Services Pager: [phone] Office: [place] 08/02/2021, 11:38 AM

## 2021-08-02 NOTE — TOC CAGE-AID Note (Signed)
Transition of Care (TOC) - CAGE-AID Screening ? ? ?Patient Details  ?Name: Robert Frey ?MRN: [id number] ?Date of Birth: [birth year] ? ?Transition of Care (TOC) CM/SW Contact:    ?Army Melia, RN ?Phone Number:[phone] ?08/02/2021, 9:12 PM ? ? ?Clinical Narrative: ? ?Presents to the hospital after a GLF resulting in right fibula fx and right foot fx/dislocation. Reports no alcohol and drug use, no resources indicated at this time. ? ?CAGE-AID Screening: ?  ? ?Have You Ever Felt You Ought to Cut Down on Your Drinking or Drug Use?: No ?Have People Annoyed You By Critizing Your Drinking Or Drug Use?: No ?Have You Felt Bad Or Guilty About Your Drinking Or Drug Use?: No ?Have You Ever Had a Drink or Used Drugs First Thing In The Morning to Steady Your Nerves or to Get Rid of a Hangover?: No ?CAGE-AID Score: 0 ? ?Substance Abuse Education Offered: No (reports no alcohol or drug use) ? ?  ? ? ? ? ? ? ?

## 2021-08-02 NOTE — Progress Notes (Signed)
? ?                              Orthopaedic Trauma Service Progress Note ? ?Patient ID: ?[facility] ?MRN: [id number] ?DOB/AGE: 08-08-1960 61 y.o. ? ?Subjective: ? ?Doing well ?Pain controlled  ?No specific complaints other than some confusion over his insulin ? ?Pt lives with wife ?Works for [employer]  ? ?Smokes 1ppd  ? ?States B peripheral neuropathy lower legs for many years.   ?Reports chronic B ankle instability  ? ? ?ROS ?As above ? ?Objective:  ? ?VITALS:   ?Vitals:  ? 08/01/21 2307 08/02/21 0329 08/02/21 0350 08/02/21 0901  ?BP: 123/70 99/69 124/76 (!) 105/53  ?Pulse: (!) 103 96  87  ?Resp: 20     ?Temp: 98.2 ?F (36.8 ?C) 97.8 ?F (36.6 ?C)  98.4 ?F (36.9 ?C)  ?TempSrc: Oral Oral  Oral  ?SpO2: 95% 94%  97%  ?Weight:      ?Height:      ? ? ?Estimated body mass index is 32.19 kg/m? as calculated from the following: ?  Height as of this encounter: '5\' 11"'$  (1.803 m). ?  Weight as of this encounter: 104.7 kg. ? ? ?Intake/Output   ?   03/06 0701 ?03/07 0700 03/07 0701 ?03/08 0700  ? P.O. 360   ? I.V. (mL/kg) 1480 (14.1)   ? IV Piggyback 100   ? Total Intake(mL/kg) 1940 (18.5)   ? Urine (mL/kg/hr) 5800 (2.3)   ? Blood 10   ? Total Output 5810   ? Net -3870.1   ?     ?  ? ?LABS ? ?Results for orders placed or performed during the hospital encounter of 08/01/21 (from the past 24 hour(s))  ?POC CBG, ED     Status: None  ? Collection Time: 08/01/21 11:43 AM  ?Result Value Ref Range  ? Glucose-Capillary 97 70 - 99 mg/dL  ?CBC     Status: Abnormal  ? Collection Time: 08/01/21  1:21 PM  ?Result Value Ref Range  ? WBC 10.3 4.0 - 10.5 K/uL  ? RBC 3.62 (L) 4.22 - 5.81 MIL/uL  ? Hemoglobin 12.3 (L) 13.0 - 17.0 g/dL  ? HCT 33.8 (L) 39.0 - 52.0 %  ? MCV 93.4 80.0 - 100.0 fL  ? MCH 34.0 26.0 - 34.0 pg  ? MCHC 36.4 (H) 30.0 - 36.0 g/dL  ? RDW 12.9 11.5 - 15.5 %  ? Platelets 237 150 - 400 K/uL  ? nRBC 0.0 0.0 - 0.2 %  ?Basic Metabolic Panel     Status: Abnormal  ? Collection Time:  08/01/21  1:21 PM  ?Result Value Ref Range  ? Sodium 124 (L) 135 - 145 mmol/L  ? Potassium 4.0 3.5 - 5.1 mmol/L  ? Chloride 88 (L) 98 - 111 mmol/L  ? CO2 24 22 - 32 mmol/L  ? Glucose, Bld 88 70 - 99 mg/dL  ? BUN 21 (H) 6 - 20 mg/dL  ? Creatinine, Ser 1.50 (H) 0.61 - 1.24 mg/dL  ? Calcium 9.2 8.9 - 10.3 mg/dL  ? GFR, Estimated 54 (L) >60 mL/min  ? Anion gap 12 5 - 15  ?Glucose, capillary     Status: Abnormal  ? Collection Time: 08/01/21  1:34 PM  ?Result Value Ref Range  ? Glucose-Capillary 102 (H) 70 - 99 mg/dL  ?Glucose, capillary     Status: Abnormal  ? Collection Time: 08/01/21  4:17 PM  ?  Result Value Ref Range  ? Glucose-Capillary 133 (H) 70 - 99 mg/dL  ?CBC     Status: Abnormal  ? Collection Time: 08/01/21  8:15 PM  ?Result Value Ref Range  ? WBC 9.6 4.0 - 10.5 K/uL  ? RBC 3.33 (L) 4.22 - 5.81 MIL/uL  ? Hemoglobin 11.1 (L) 13.0 - 17.0 g/dL  ? HCT 31.3 (L) 39.0 - 52.0 %  ? MCV 94.0 80.0 - 100.0 fL  ? MCH 33.3 26.0 - 34.0 pg  ? MCHC 35.5 30.0 - 36.0 g/dL  ? RDW 12.9 11.5 - 15.5 %  ? Platelets 227 150 - 400 K/uL  ? nRBC 0.0 0.0 - 0.2 %  ?Creatinine, serum     Status: Abnormal  ? Collection Time: 08/01/21  8:15 PM  ?Result Value Ref Range  ? Creatinine, Ser 1.63 (H) 0.61 - 1.24 mg/dL  ? GFR, Estimated 49 (L) >60 mL/min  ?Glucose, capillary     Status: Abnormal  ? Collection Time: 08/01/21  9:33 PM  ?Result Value Ref Range  ? Glucose-Capillary 245 (H) 70 - 99 mg/dL  ?Glucose, capillary     Status: Abnormal  ? Collection Time: 08/01/21 11:51 PM  ?Result Value Ref Range  ? Glucose-Capillary 211 (H) 70 - 99 mg/dL  ?Basic metabolic panel     Status: Abnormal  ? Collection Time: 08/02/21  3:25 AM  ?Result Value Ref Range  ? Sodium 127 (L) 135 - 145 mmol/L  ? Potassium 4.2 3.5 - 5.1 mmol/L  ? Chloride 91 (L) 98 - 111 mmol/L  ? CO2 24 22 - 32 mmol/L  ? Glucose, Bld 161 (H) 70 - 99 mg/dL  ? BUN 27 (H) 6 - 20 mg/dL  ? Creatinine, Ser 1.27 (H) 0.61 - 1.24 mg/dL  ? Calcium 9.6 8.9 - 10.3 mg/dL  ? GFR, Estimated >60 >60 mL/min   ? Anion gap 12 5 - 15  ?VITAMIN D 25 Hydroxy (Vit-D Deficiency, Fractures)     Status: Abnormal  ? Collection Time: 08/02/21  3:25 AM  ?Result Value Ref Range  ? Vit D, 25-Hydroxy 9.74 (L) 30 - 100 ng/mL  ?Glucose, capillary     Status: Abnormal  ? Collection Time: 08/02/21  3:57 AM  ?Result Value Ref Range  ? Glucose-Capillary 178 (H) 70 - 99 mg/dL  ?Glucose, capillary     Status: Abnormal  ? Collection Time: 08/02/21  9:00 AM  ?Result Value Ref Range  ? Glucose-Capillary 243 (H) 70 - 99 mg/dL  ? ? ? ?PHYSICAL EXAM:  ? ?Gen: sitting up in chair, NAD  ?Lungs: unlabored ?Cardiac: RRR ?Ext:  ?     Right Lower Extremity  ? Splint c/d/I ? Ext warm  ? Altered sensation to dorsum and plantar R foot (symmetric on L side) ? EHL, FHL, lesser toe motor grossly intact ? No pain out of proportion with passive stretching of toes  ? ? ?Assessment/Plan: ?1 Day Post-Op  ? ? ? ?Anti-infectives (From admission, onward)  ? ? Start     Dose/Rate Route Frequency Ordered Stop  ? 08/01/21 2200  ceFAZolin (ANCEF) IVPB 2g/100 mL premix       ? 2 g ?200 mL/hr over 30 Minutes Intravenous Every 8 hours 08/01/21 1757 08/02/21 2159  ? 08/01/21 1400  ceFAZolin (ANCEF) IVPB 2g/100 mL premix       ? 2 g ?200 mL/hr over 30 Minutes Intravenous  Once 08/01/21 1355 08/01/21 1420  ? 08/01/21 1356  ceFAZolin (ANCEF) 2-4 GM/100ML-% IVPB       ?  Note to Pharmacy: Roosvelt Maser N: cabinet override  ?    08/01/21 1356 08/01/21 1422  ? ?  ?. ? ?POD/HD#: 1 ? ?61 y/o male s/p ground level fall with acute R syndesmotic disruption, chronic talonavicular dislocation/charcot neuropathy  ? ?-R syndesmotic disruption (maisonneuve fracture), R foot charcot neuropathy with talonavicular dislocation s/p repair of R syndesmosis and  CRPP R talonavicular joint  ? Nonweightbearing right leg for 8 weeks ? Splint for 2 weeks and then convert to cam boot ? Explicitly discussed the importance of maintaining nonweightbearing on his right leg for the duration.  He reports  peripheral neuropathy in both feet for many years but does not have a regular provider other than his primary care provider.  We will likely refer him to Dr. Sharol Given once he has healed his syndesmosis and pins have been removed  ? ? PT/OT ? ?- Pain management: ? Multimodal ? ?- ABL anemia/Hemodynamics ? Stable ? ?- Medical issues  ? Home meds  ? ?- DVT/PE prophylaxis: ? Dc on xarelto daily x 30 days ? ?- ID:  ? Periop abx ? ?- Metabolic Bone Disease: ? Vitamin d deficiency  ?  Supplement  ? 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 ?- Activity: ? NWB R leg ?- FEN/GI prophylaxis/Foley/Lines: ? Carb mod diet ? ?-Ex-fix/Splint care: ? Keep splint clean and dry x 2 weeks, do not remove ? ?- Impediments to fracture healing: ? Nicotine use ? DM ? Autoimmune disease and associated meds ? Vitamin d deficiency  ?- Dispo: ? Therapy evals ? Home tomorrow  ? ? ? ?Jari Pigg, PA-C ?[phone] (C) ?08/02/2021, 9:43 AM ? ?[facility] ?[place] [address] ?[phone] Jenetta Downer) ?[phone] (F) ? ? ? ?After 5pm and on the weekends please log on to Amion, go to orthopaedics and the look under the [facility] Call for the provider(s) on call. You can also call our office at [phone] and then follow the prompts to be connected to the call team.  ? Patient ID: Robert Frey, male   DOB: 05-27-1961, 61 y.o.   MRN: [id number] ? ?

## 2021-08-02 NOTE — Progress Notes (Signed)
Mobility Specialist: Progress Note ? ? 08/02/21 1600  ?Mobility  ?Activity Ambulated with assistance in room  ?Level of Assistance Minimal assist, patient does 75% or more  ?Assistive Device Front wheel walker  ?RLE Weight Bearing NWB  ?Distance Ambulated (ft) 40 ft ?(20'x2)  ?Activity Response Tolerated well  ?$Mobility charge 1 Mobility  ? ?Pt received in bed and agreeable to mobility. Ambulated to the couch and back to the bed x2 with seated break in between. C/o 8/10 RLE pain during session, otherwise asymptomatic. Pt back to bed with call bell and phone at his side. Bed alarm is on.  ? ?Robert Frey ?Mobility Specialist ?Mobility Specialist [place]: [phone] ?Mobility Specialist [place]: [phone] ? ?

## 2021-08-02 NOTE — Evaluation (Signed)
Physical Therapy Evaluation Patient Details Name: Robert Frey MRN: [id number] DOB: Apr 06, 1961 Today's Date: 08/02/2021  History of Present Illness  Pt is 61 yo male with ground level fall with R ankle and knee pain. CT showed complex fx involving Lisfranc and Chopart joint. Also with proximal fibular fx. Underwent  R foot ORIF 08/01/21.  PMH: ankylosing spondylitis, COPD, DM, HTN  Clinical Impression  Pt admitted with above diagnosis. Pt able to mobilize keeping RLE NWB. However, pt very distracted and impulsive today, wife reports it is not his baseline but due to elevated glucose. Needed mod A +2 for safety today because of decreased attention. Expect he will progress well and be able to d/c home with wife and son and begin outpt PT when allowed to [facility].  Pt currently with functional limitations due to the deficits listed below (see PT Problem List). Pt will benefit from skilled PT to increase their independence and safety with mobility to allow discharge to the venue listed below.          Recommendations for follow up therapy are one component of a multi-disciplinary discharge planning process, led by the attending physician.  Recommendations may be updated based on patient status, additional functional criteria and insurance authorization.  Follow Up Recommendations Outpatient PT (when allowed to [facility])    Assistance Recommended at Discharge Intermittent Supervision/Assistance  Patient can return home with the following  A little help with walking and/or transfers;A little help with bathing/dressing/bathroom;Assistance with cooking/housework;Help with stairs or ramp for entrance;Assist for transportation    Equipment Recommendations Rolling walker (2 wheels)  Recommendations for Other Services       Functional Status Assessment Patient has had a recent decline in their functional status and demonstrates the ability to make significant improvements in function in a reasonable and predictable  amount of time.     Precautions / Restrictions Precautions Precautions: Fall Precaution Comments: impulsive Required Braces or Orthoses: Splint/Cast Splint/Cast: RLE Restrictions Weight Bearing Restrictions: Yes RLE Weight Bearing: Non weight bearing      Mobility  Bed Mobility Overal bed mobility: Needs Assistance Bed Mobility: Supine to Sit     Supine to sit: Supervision     General bed mobility comments: supervision for line safety    Transfers Overall transfer level: Needs assistance Equipment used: Rolling walker (2 wheels) Transfers: Sit to/from Stand Sit to Stand: Mod assist           General transfer comment: max verbal cues and min physical assist    Ambulation/Gait Ambulation/Gait assistance: Mod assist, +2 safety/equipment Gait Distance (Feet): 10 Feet Assistive device: Rolling walker (2 wheels) Gait Pattern/deviations:  (hop to) Gait velocity: decreased Gait velocity interpretation: <1.8 ft/sec, indicate of risk for recurrent falls   General Gait Details: pt able to take wt through UE's and keep RLE NWB but unsafe sue to impulsivity. Wife and pt educated on safety and need for shoe for LLE for cuhsion and protection of that extremity.  Stairs            Wheelchair Mobility    Modified Rankin (Stroke Patients Only)       Balance Overall balance assessment: Needs assistance Sitting-balance support: No upper extremity supported, Feet supported Sitting balance-Leahy Scale: Good     Standing balance support: Bilateral upper extremity supported, During functional activity, Reliant on assistive device for balance Standing balance-Leahy Scale: Poor Standing balance comment: decreased safety awareness in standing  Pertinent Vitals/Pain Pain Assessment Pain Assessment: 0-10 Pain Score: 7  Pain Location: R ankle and knee Pain Descriptors / Indicators: Aching, Sore Pain Intervention(s): Limited  activity within patient's tolerance, Monitored during session, Patient requesting pain meds-RN notified, RN gave pain meds during session    Home Living Family/patient expects to be discharged to:: Private residence Living Arrangements: Spouse/significant other;Children Available Help at Discharge: Family;Available PRN/intermittently Type of Home: House Home Access: Stairs to enter Entrance Stairs-Rails: None Entrance Stairs-Number of Steps: 1 Alternate Level Stairs-Number of Steps: flight Home Layout: Two level;Able to live on main level with bedroom/bathroom Home Equipment: Crutches;Cane - single point Additional Comments: wife can take some time off work, 25 yo son can be with him as well. Pt hoping to be able to work from home for several weeks    Prior Function Prior Level of Function : Independent/Modified Independent;Working/employed;Driving             Mobility Comments: computer work, drives one hr each way       Hand Dominance   Dominant Hand: Left    Extremity/Trunk Assessment   Upper Extremity Assessment Upper Extremity Assessment: Defer to OT evaluation    Lower Extremity Assessment Lower Extremity Assessment: RLE deficits/detail RLE Deficits / Details: hip flex 4/5, knee ext 3/5 (painful), ankle splinted s/p sx RLE: Unable to fully assess due to immobilization RLE Sensation: WNL RLE Coordination: WNL    Cervical / Trunk Assessment Cervical / Trunk Assessment: Normal  Communication   Communication: No difficulties  Cognition Arousal/Alertness: Awake/alert Behavior During Therapy: Impulsive Overall Cognitive Status: Impaired/Different from baseline Area of Impairment: Safety/judgement, Awareness, Following commands, Attention                   Current Attention Level: Focused   Following Commands: Follows one step commands consistently Safety/Judgement: Decreased awareness of safety Awareness: Intellectual   General Comments: pt very  impulsive today, wife reports it is due to his elevated glucose and that this is not his baseline. Needed quiet environment with no distractions to be able to focus. Not attending to safety        General Comments General comments (skin integrity, edema, etc.): VSS    Exercises     Assessment/Plan    PT Assessment Patient needs continued PT services  PT Problem List Decreased knowledge of precautions;Decreased cognition;Decreased mobility;Decreased activity tolerance;Pain       PT Treatment Interventions DME instruction;Gait training;Stair training;Functional mobility training;Therapeutic activities;Therapeutic exercise;Balance training;Patient/family education;Cognitive remediation    PT Goals (Current goals can be found in the Care Plan section)  Acute Rehab PT Goals Patient Stated Goal: return home and to work PT Goal Formulation: With patient/family Time For Goal Achievement: 08/16/21 Potential to Achieve Goals: Good    Frequency Min 5X/week     Co-evaluation PT/OT/SLP Co-Evaluation/Treatment: Yes Reason for Co-Treatment: For patient/therapist safety PT goals addressed during session: Mobility/safety with mobility;Balance;Proper use of DME;Strengthening/ROM OT goals addressed during session: ADL's and self-care;Proper use of Adaptive equipment and DME       AM-PAC PT "6 Clicks" Mobility  Outcome Measure Help needed turning from your back to your side while in a flat bed without using bedrails?: None Help needed moving from lying on your back to sitting on the side of a flat bed without using bedrails?: A Little Help needed moving to and from a bed to a chair (including a wheelchair)?: A Little Help needed standing up from a chair using your arms (e.g., wheelchair or bedside chair)?:  A Lot Help needed to walk in hospital room?: A Lot Help needed climbing 3-5 steps with a railing? : Total 6 Click Score: 15    End of Session Equipment Utilized During Treatment: Gait  belt Activity Tolerance: Patient tolerated treatment well Patient left: in chair;with call bell/phone within reach;with chair alarm set;with family/visitor present Nurse Communication: Mobility status PT Visit Diagnosis: Unsteadiness on feet (R26.81);Difficulty in walking, not elsewhere classified (R26.2);Pain Pain - Right/Left: Right Pain - part of body: Ankle and joints of foot;Knee    Time: 6546-5035 PT Time Calculation (min) (ACUTE ONLY): 24 min   Charges:   PT Evaluation $PT Eval Moderate Complexity: [place], [place]  Pager [phone] Office [place] 08/02/2021, 12:33 PM

## 2021-08-02 NOTE — TOC Initial Note (Signed)
Transition of Care (TOC) - Initial/Assessment Note  ? ? ?Patient Details  ?Name: Robert Frey ?MRN: [id number] ?Date of Birth: 08-04-1960 ? ?Transition of Care (TOC) CM/SW Contact:    ?Angelita Ingles, RN ?Phone Number:[phone] ? ?08/02/2021, 3:46 PM ? ?Clinical Narrative:                 ? ?Transition of Care (TOC) Screening Note ? ? ?Patient Details  ?Name: Robert Frey ?Date of Birth: [birth year] ? ? ?Transition of Care (TOC) CM/SW Contact:    ?Angelita Ingles, RN ?Phone Number: ?08/02/2021, 3:47 PM ? ? ? ?Transition of Care Department [facility]) has reviewed patient and no TOC needs have been identified at this time. We will continue to monitor patient advancement through interdisciplinary progression rounds.  ? ? ? ? ? ? ? ? ? ? ?  ? ?  ?  ? ? ?Patient Goals and CMS Choice ?  ?  ?  ? ?Expected Discharge Plan and Services ?  ?  ?  ?  ?  ?                ?  ?  ?  ?  ?  ?  ?  ?  ?  ?  ? ?Prior Living Arrangements/Services ?  ?  ?  ?       ?  ?  ?  ?  ? ?Activities of Daily Living ?  ?  ? ?Permission Sought/Granted ?  ?  ?   ?   ?   ?   ? ?Emotional Assessment ?  ?  ?  ?  ?  ?  ? ?Admission diagnosis:  Elective surgery [Z41.9] ?Other closed fracture of proximal end of right fibula, initial encounter [S82.831A] ?Syndesmotic disruption of right ankle [S93.431A] ?Patient Active Problem List  ? Diagnosis Date Noted  ? Syndesmotic disruption of right ankle 08/01/2021  ? Family history of blood clots 07/26/2021  ? Leg edema, right 07/26/2021  ? Right leg pain 07/26/2021  ? Absent pedal pulses 07/13/2021  ? Acute right ankle pain 07/13/2021  ? Diabetic foot infection ([place]) 06/28/2021  ? Psoriasis 06/28/2021  ? Acute left-sided low back pain without sciatica 06/07/2021  ? Chronic foot pain, right 05/03/2021  ? Rib pain on left side 03/07/2021  ? Fall 03/07/2021  ? Family history of Alzheimer's disease 11/09/2020  ? Diabetic neuropathy ([place]) 01/28/2020  ? Current smoker 10/22/2019  ? Nosebleed 10/22/2019  ? Primary osteoarthritis,  right glenohumeral joint 07/02/2019  ? Shoulder separation, right, subsequent encounter 06/17/2019  ? Essential tremor 04/16/2019  ? Shakes 04/04/2019  ? Dark urine 01/01/2019  ? Lumbosacral strain 01/17/2017  ? Bruising 06/09/2015  ? Essential hypertension 06/08/2015  ? Microalbuminuria 06/08/2015  ? Lung nodule < 6cm on CT 05/15/2014  ? Heel spur 01/20/2014  ? Osteoarthritis of foot, left 01/20/2014  ? Psoriatic arthritis ([place]) 10/11/2012  ? Type 2 diabetes mellitus with microalbuminuria, with long-term current use of insulin ([place]) 10/11/2012  ? Ankylosing spondylitis of multiple sites in spine ([place]) 09/30/2009  ? HYPERCHOLESTEROLEMIA 03/06/2006  ? OBESITY, NOS 03/06/2006  ? OBSESSIVE COMPUL. DISORDER 03/06/2006  ? ?PCP:  Donella Stade, PA-C ?Pharmacy:   ?[id number] [place], [place] - [address]?Phone: [phone]: [phone]? ?[facility] [place] [address]?Phone: [phone]: [phone]? ?  [place] [address] ?[place] [address] ?Phone: [phone] Fax: [phone] ? ? ? ? ?Social Determinants of Health (SDOH) Interventions ?  ? ?Readmission Risk Interventions ?No flowsheet data found. ? ? ?

## 2021-08-03 ENCOUNTER — Other Ambulatory Visit (HOSPITAL_COMMUNITY): Payer: Self-pay

## 2021-08-03 DIAGNOSIS — M14671 Charcot's joint, right ankle and foot: Secondary | ICD-10-CM | POA: Diagnosis present

## 2021-08-03 LAB — GLUCOSE, CAPILLARY
Glucose-Capillary: 152 mg/dL — ABNORMAL HIGH (ref 70–99)
Glucose-Capillary: 110 mg/dL — ABNORMAL HIGH (ref 70–99)
Glucose-Capillary: 101 mg/dL — ABNORMAL HIGH (ref 70–99)

## 2021-08-03 LAB — CBC
HCT: 32.7 % — ABNORMAL LOW (ref 39.0–52.0)
Hemoglobin: 11.6 g/dL — ABNORMAL LOW (ref 13.0–17.0)
MCH: 34.1 pg — ABNORMAL HIGH (ref 26.0–34.0)
MCHC: 35.5 g/dL (ref 30.0–36.0)
MCV: 96.2 fL (ref 80.0–100.0)
Platelets: 224 10*3/uL (ref 150–400)
RBC: 3.4 MIL/uL — ABNORMAL LOW (ref 4.22–5.81)
RDW: 12.9 % (ref 11.5–15.5)
WBC: 9.9 10*3/uL (ref 4.0–10.5)
nRBC: 0 % (ref 0.0–0.2)

## 2021-08-03 LAB — BASIC METABOLIC PANEL
Anion gap: 10 (ref 5–15)
BUN: 29 mg/dL — ABNORMAL HIGH (ref 6–20)
CO2: 28 mmol/L (ref 22–32)
Calcium: 9.3 mg/dL (ref 8.9–10.3)
Chloride: 91 mmol/L — ABNORMAL LOW (ref 98–111)
Creatinine, Ser: 1.19 mg/dL (ref 0.61–1.24)
GFR, Estimated: >60 mL/min (ref >60)
Glucose, Bld: 100 mg/dL — ABNORMAL HIGH (ref 70–99)
Potassium: 4 mmol/L (ref 3.5–5.1)
Sodium: 129 mmol/L — ABNORMAL LOW (ref 135–145)

## 2021-08-03 LAB — CALCIUM, IONIZED: Calcium, Ionized, Serum: 5.2 mg/dL (ref 4.5–5.6)

## 2021-08-03 MED ORDER — ASCORBIC ACID 500 MG PO TABS
1000.0000 mg | ORAL_TABLET | Freq: Every day | ORAL | 1 refills | Status: AC
  Filled 2021-08-03: qty 30, 15d supply, fill #0

## 2021-08-03 MED ORDER — CALCIUM CITRATE 950 (200 CA) MG PO TABS
200.0000 mg | ORAL_TABLET | Freq: Two times a day (BID) | ORAL | 1 refills | Status: DC
Start: 2021-08-03 — End: 2021-09-26
  Filled 2021-08-03: qty 30, 15d supply, fill #0

## 2021-08-03 MED ORDER — METHOCARBAMOL 500 MG PO TABS
500.0000 mg | ORAL_TABLET | Freq: Three times a day (TID) | ORAL | 0 refills | Status: DC | PRN
Start: 2021-08-03 — End: 2021-09-26
  Filled 2021-08-03: qty 60, 10d supply, fill #0

## 2021-08-03 MED ORDER — OXYCODONE-ACETAMINOPHEN 5-325 MG PO TABS
1.0000 | ORAL_TABLET | Freq: Four times a day (QID) | ORAL | 0 refills | Status: DC | PRN
  Filled 2021-08-03: qty 50, 7d supply, fill #0

## 2021-08-03 MED ORDER — ACETAMINOPHEN 500 MG PO TABS
500.0000 mg | ORAL_TABLET | Freq: Two times a day (BID) | ORAL | 0 refills | Status: DC
  Filled 2021-08-03: qty 60, 30d supply, fill #0

## 2021-08-03 MED ORDER — RIVAROXABAN 15 MG PO TABS
15.0000 mg | ORAL_TABLET | Freq: Every day | ORAL | 0 refills | Status: DC
  Filled 2021-08-03: qty 30, 30d supply, fill #0

## 2021-08-03 MED ORDER — ZINC SULFATE 220 (50 ZN) MG PO TABS
220.0000 mg | ORAL_TABLET | Freq: Every day | ORAL | 1 refills | Status: DC
  Filled 2021-08-03: qty 30, 30d supply, fill #0

## 2021-08-03 MED ORDER — CHOLECALCIFEROL 125 MCG (5000 UT) PO TABS
ORAL_TABLET | Freq: Every day | ORAL | 6 refills | Status: DC
  Filled 2021-08-03: qty 30, 30d supply, fill #0

## 2021-08-03 MED ORDER — VITAMIN D (ERGOCALCIFEROL) 1.25 MG (50000 UNIT) PO CAPS
50000.0000 [IU] | ORAL_CAPSULE | ORAL | 2 refills | Status: AC
  Filled 2021-08-03: qty 5, 35d supply, fill #0

## 2021-08-03 MED ORDER — DOCUSATE SODIUM 100 MG PO CAPS
100.0000 mg | ORAL_CAPSULE | Freq: Two times a day (BID) | ORAL | 0 refills | Status: DC
  Filled 2021-08-03: qty 30, 15d supply, fill #0

## 2021-08-03 NOTE — Progress Notes (Signed)
Occupational Therapy Treatment ?Patient Details ?Name: Robert Frey ?MRN: [id number] ?DOB: [birth year] ?Today's Date: 08/03/2021 ? ? ?History of present illness Pt is 61 yo male with ground level fall with R ankle and knee pain. CT showed complex fx involving Lisfranc and Chopart joint. Also with proximal fibular fx. Underwent  R foot ORIF 08/01/21.  PMH: ankylosing spondylitis, COPD, DM, HT ?  ?OT comments ? Patient with improved mentation per spouse.  Following commands well, the patient continues to struggle with slowing down to improve 2WRW safety and reduce his fall risk.  A lot of time was spent on cueing and demonstration to slow down and break up transitional movements into step by step to reduce falls.  Ultimately he will have assist at home, but will be alone at times.  Strategies for meal prep and toileting reviewed.  Patient verbalized understanding, and all questions answered.  OT will continue efforts in the acute setting.    ? ?Recommendations for follow up therapy are one component of a multi-disciplinary discharge planning process, led by the attending physician.  Recommendations may be updated based on patient status, additional functional criteria and insurance authorization. ?   ?Follow Up Recommendations ? No OT follow up  ?  ?Assistance Recommended at Discharge  Intermittent assist  ?Patient can return home with the following ?   ?  ?Equipment Recommendations ? BSC/3in1  ?  ?Recommendations for Other Services   ? ?  ?Precautions / Restrictions Precautions ?Precautions: Fall ?Required Braces or Orthoses: Splint/Cast ?Splint/Cast: RLE ?Restrictions ?Weight Bearing Restrictions: Yes ?RLE Weight Bearing: Non weight bearing  ? ? ?  ? ?Mobility Bed Mobility ?  ?  ?  ?  ?  ?  ?  ?General bed mobility comments: sitting EOB eating breakfast ?  ? ?Transfers ?Overall transfer level: Needs assistance ?Equipment used: Rolling walker (2 wheels) ?Transfers: Sit to/from Stand ?Sit to Stand: Supervision ?  ?  ?  ?   ?  ?  ?  ?  ?Balance Overall balance assessment: Needs assistance ?Sitting-balance support: No upper extremity supported, Feet supported ?Sitting balance-Leahy Scale: Good ?  ?  ?Standing balance support: Reliant on assistive device for balance ?Standing balance-Leahy Scale: Poor ?  ?  ?  ?  ?  ?  ?  ?  ?  ?  ?  ?  ?   ? ?ADL either performed or assessed with clinical judgement  ? ?ADL Overall ADL's : Needs assistance/impaired ?  ?  ?Grooming: Supervision/safety;Standing ?  ?  ?  ?  ?  ?Upper Body Dressing : Set up;Sitting ?  ?Lower Body Dressing: Minimal assistance;Sit to/from stand ?  ?Toilet Transfer: Min guard;Ambulation;Regular Toilet;Rolling walker (2 wheels) ?Toilet Transfer Details (indicate cue type and reason): continued decreased safety ?Toileting- Clothing Manipulation and Hygiene: Supervision/safety;Sit to/from stand ?  ?  ?  ?  ?  ?  ? ?Extremity/Trunk Assessment Upper Extremity Assessment ?Upper Extremity Assessment: Overall WFL for tasks assessed ?  ?Lower Extremity Assessment ?Lower Extremity Assessment: Defer to PT evaluation ?  ?Cervical / Trunk Assessment ?Cervical / Trunk Assessment: Normal ?  ? ?Vision Baseline Vision/History: 1 Wears glasses ?Patient Visual Report: No change from baseline ?  ?  ?Perception Perception ?Perception: Within Functional Limits ?  ?Praxis Praxis ?Praxis: Intact ?  ? ?Cognition Arousal/Alertness: Awake/alert ?Behavior During Therapy: [facility] for tasks assessed/performed ?  ?Area of Impairment: Safety/judgement ?  ?  ?  ?  ?  ?  ?  ?  ?  ?  ?  ?  ?  Safety/Judgement: Decreased awareness of safety ?  ?  ?General Comments: Tendency to move fast.  A lot of cueing to slow down and break transitional movements into one step at a time. ?  ?  ?   ?Exercises   ? ?  ?Shoulder Instructions   ? ? ?  ?General Comments    ? ? ?Pertinent Vitals/ Pain       Pain Assessment ?Pain Assessment: Faces ?Faces Pain Scale: Hurts even more ?Pain Location: R ankle and knee ?Pain Descriptors /  Indicators: Aching, Sore ?Pain Intervention(s): Monitored during session ? ?   ?  ?  ?  ?  ?  ?  ?  ?  ?  ?  ?  ?  ?  ?  ?  ?  ?  ?  ? ?  ?    ?  ?  ?  ?   ? ?Frequency ? Min 2X/week  ? ? ? ? ?  ?Progress Toward Goals ? ?OT Goals(current goals can now be found in the care plan section) ? Progress towards OT goals: Progressing toward goals ? ?Acute Rehab OT Goals ?Patient Stated Goal: Return home ?OT Goal Formulation: With patient ?Time For Goal Achievement: 08/16/21 ?Potential to Achieve Goals: Good  ?Plan Discharge plan remains appropriate   ? ?Co-evaluation ? ? ?   ?  ?  ?  ?  ? ?  ?AM-PAC OT "6 Clicks" Daily Activity     ?Outcome Measure ? ? Help from another person eating meals?: None ?Help from another person taking care of personal grooming?: A Little ?Help from another person toileting, which includes using toliet, bedpan, or urinal?: A Little ?Help from another person bathing (including washing, rinsing, drying)?: A Little ?Help from another person to put on and taking off regular upper body clothing?: A Little ?Help from another person to put on and taking off regular lower body clothing?: A Little ?6 Click Score: 19 ? ?  ?End of Session Equipment Utilized During Treatment: Rolling walker (2 wheels) ? ?OT Visit Diagnosis: Unsteadiness on feet (R26.81);Other abnormalities of gait and mobility (R26.89);Muscle weakness (generalized) (M62.81);Other symptoms and signs involving cognitive function;Pain ?Pain - Right/Left: Right ?Pain - part of body: Knee;Ankle and joints of foot ?  ?Activity Tolerance Patient tolerated treatment well ?  ?Patient Left in chair;with call bell/phone within reach;with family/visitor present ?  ?Nurse Communication Mobility status ?  ? ?   ? ?Time: 8850-2774 ?OT Time Calculation (min): 23 min ? ?Charges: OT General Charges ?$OT Visit: 1 Visit ?OT Treatments ?$Self Care/Home Management : 23-37 mins ? ?08/03/2021 ? ?RP, OTR/L ? ?Acute Rehabilitation Services ? ?Office:   [phone] ? ? ?Xavi Tomasik D Lindalee Huizinga ?08/03/2021, 10:25 AM ?

## 2021-08-03 NOTE — Discharge Summary (Cosign Needed)
Orthopaedic Trauma Service (OTS) Discharge Summary   Patient ID: Robert Frey MRN: [id number] DOB/AGE: [birth year] 61 y.o.  Admit date: 08/01/2021 Discharge date: 08/03/2021  Admission Diagnoses:   Syndesmotic disruption of right ankle   Chronic foot pain, right   OBESITY, NOS   Ankylosing spondylitis of multiple sites in spine ([name])   Psoriatic arthritis ([name])   Type 2 diabetes mellitus with microalbuminuria, with long-term current use of insulin ([name])   Essential hypertension   Current smoker   Diabetic neuropathy ([name])   Chronic foot pain, right   Absent pedal pulses   Charcot's joint of right foot  Discharge Diagnoses:  Principal Problem:   Syndesmotic disruption of right ankle Active Problems:   OBESITY, NOS   Ankylosing spondylitis of multiple sites in spine ([name])   Psoriatic arthritis ([name])   Type 2 diabetes mellitus with microalbuminuria, with long-term current use of insulin ([name])   Essential hypertension   Current smoker   Diabetic neuropathy ([name])   Chronic foot pain, right   Absent pedal pulses   Charcot's joint of right foot   Past Medical History:  Diagnosis Date   Ankylosing spondylitis ([name])    COPD (chronic obstructive pulmonary disease) ([name])    Diabetes mellitus without complication ([name])    High cholesterol    Hypertension      Procedures Performed: 08/01/2021- Dr. Marcelino Scot Repair of right syndesmotic disruption Closed reduction and percutaneous fixation right Charcot foot dislocation (talonavicular joint)  Discharged Condition: good  Hospital Course:  61 year old male with complex medical history including type 2 diabetes enclosing spondylitis, psoriatic arthritis, nicotine dependence and diabetic neuropathy who presented to [facility] after sustaining a fall on some hardwood floors at home with acute right ankle pain.  Patient has chronic right foot and ankle pain but this was a result of an injury.  He presented to med  [facility] x-rays were obtained of his foot and ankle which suggested injury to his midfoot as well he also was noted to have a right proximal fibula fracture consistent with a Maisonneuve fracture.  CT scan of his foot incidentally noted a right Charcot foot dislocation of his talonavicular joint.  Patient was subsequently transferred to [facility] for further evaluation.  Upon removal of his splint his foot did not exhibit any signs of acute trauma such as swelling, redness, ecchymosis however his ankle did have significant swelling and was unstable on clinical evaluation.  Patient was taken to the operating room on the day of transfer to repair his syndesmotic disruption.  We also evaluated his foot under fluoroscopy and determined that his joint was reducible so we proceeded with percutaneous pinning of the joint as well.  Patient tolerated the surgeries well.  He was transferred to the PACU for recovery from anesthesia and then transferred up to the progressive care unit for continued observation, pain control therapies.  Patient's hospital stay was really uncomplicated.  He was ultimately deemed stable for discharge on postoperative day #2 after working with therapies.  Patient has been having some chronic swelling in his right leg and was scheduled to have ABIs performed during this admission.  These will need to be delayed until his splint comes off of his right leg.  He will also be referred most likely to Dr. Sharol Given for long-term follow-up regarding his diabetic neuropathy with associated Charcot joint deformity to his right foot.  We did obtain some basic metabolic labs which did show profound vitamin  D deficiency.  He was started on vitamin D supplementation including vitamin D2 and vitamin D3.  Given his complex medical history I think it would be advisable to proceed with a bone density scan.  Would also recommend testosterone panel as part of his work-up.  His bone quality was  suboptimal and he is at increased risk for additional fractures given his complex medical history.  Patient discharged in stable condition on 08/03/2021.  He will be discharged on Xarelto 15 mg daily for the next 30 days for DVT and PE prophylaxis.  He was covered with Lovenox while inpatient.  He also received appropriate antibiosis for perioperative antibiotic coverage.  Consults: None  Significant Diagnostic Studies: labs:   Latest Reference Range & Units 08/03/21 06:53  WBC 4.0 - 10.5 K/uL 9.9  RBC 4.22 - 5.81 MIL/uL 3.40 (L)  Hemoglobin 13.0 - 17.0 g/dL 11.6 (L)  HCT 39.0 - 52.0 % 32.7 (L)  MCV 80.0 - 100.0 fL 96.2  MCH 26.0 - 34.0 pg 34.1 (H)  MCHC 30.0 - 36.0 g/dL 35.5  RDW 11.5 - 15.5 % 12.9  Platelets 150 - 400 K/uL 224  nRBC 0.0 - 0.2 % 0.0  Glucose 70 - 99 mg/dL 100 (H)  (L): Data is abnormally low (H): Data is abnormally high   Latest Reference Range & Units 08/02/21 03:25  Vitamin D, 25-Hydroxy 30 - 100 ng/mL 9.74 (L)  (L): Data is abnormally low  Treatments: IV hydration, antibiotics: Ancef, analgesia: acetaminophen, Dilaudid, and oxycodone, anticoagulation: LMW heparin and Xarelto at discharge, therapies: PT, OT, and RN, and surgery: As above  Discharge Exam:                                Orthopaedic Trauma Service Progress Note   Patient ID: Robert Frey MRN: [id number] DOB/AGE: 10-27-1960 61 y.o.   Subjective:   No acute issues Wants to go home today    Has walker at home     ROS As above   Objective:    VITALS:         Vitals:    08/02/21 1938 08/02/21 2336 08/03/21 0401 08/03/21 0729  BP: (!) 116/54 115/61 106/65 113/66  Pulse: 92 86 73 77  Resp: '20   20 18  '$ Temp: 98.7 F (37.1 C) 97.8 F (36.6 C) 97.6 F (36.4 C) 97.8 F (36.6 C)  TempSrc:     Oral Oral  SpO2: 98% 95% 92% 99%  Weight:          Height:              Estimated body mass index is 32.19 kg/m as calculated from the following:   Height as of this encounter: '5\' 11"'$   (1.803 m).   Weight as of this encounter: 104.7 kg.     Intake/Output      03/07 0701 03/08 0700 03/08 0701 03/09 0700   P.O. 120 240   I.V. (mL/kg)     IV Piggyback     Total Intake(mL/kg) 120 (1.1) 240 (2.3)   Urine (mL/kg/hr) 2425 (1)    Blood     Total Output 2425    Net -2305 +240           LABS   Lab Results Last 24 Hours       Results for orders placed or performed during the hospital encounter of 08/01/21 (from the past 24 hour(s))  Glucose,  capillary     Status: Abnormal    Collection Time: 08/02/21 12:27 PM  Result Value Ref Range    Glucose-Capillary 172 (H) 70 - 99 mg/dL  Glucose, capillary     Status: Abnormal    Collection Time: 08/02/21  3:16 PM  Result Value Ref Range    Glucose-Capillary 167 (H) 70 - 99 mg/dL  Glucose, capillary     Status: Abnormal    Collection Time: 08/02/21  7:39 PM  Result Value Ref Range    Glucose-Capillary 183 (H) 70 - 99 mg/dL  Glucose, capillary     Status: Abnormal    Collection Time: 08/02/21 11:38 PM  Result Value Ref Range    Glucose-Capillary 141 (H) 70 - 99 mg/dL  Glucose, capillary     Status: Abnormal    Collection Time: 08/03/21  4:01 AM  Result Value Ref Range    Glucose-Capillary 152 (H) 70 - 99 mg/dL  Basic metabolic panel     Status: Abnormal    Collection Time: 08/03/21  6:53 AM  Result Value Ref Range    Sodium 129 (L) 135 - 145 mmol/L    Potassium 4.0 3.5 - 5.1 mmol/L    Chloride 91 (L) 98 - 111 mmol/L    CO2 28 22 - 32 mmol/L    Glucose, Bld 100 (H) 70 - 99 mg/dL    BUN 29 (H) 6 - 20 mg/dL    Creatinine, Ser 1.19 0.61 - 1.24 mg/dL    Calcium 9.3 8.9 - 10.3 mg/dL    GFR, Estimated >60 >60 mL/min    Anion gap 10 5 - 15  CBC     Status: Abnormal    Collection Time: 08/03/21  6:53 AM  Result Value Ref Range    WBC 9.9 4.0 - 10.5 K/uL    RBC 3.40 (L) 4.22 - 5.81 MIL/uL    Hemoglobin 11.6 (L) 13.0 - 17.0 g/dL    HCT 32.7 (L) 39.0 - 52.0 %    MCV 96.2 80.0 - 100.0 fL    MCH 34.1 (H) 26.0 - 34.0 pg     MCHC 35.5 30.0 - 36.0 g/dL    RDW 12.9 11.5 - 15.5 %    Platelets 224 150 - 400 K/uL    nRBC 0.0 0.0 - 0.2 %  Glucose, capillary     Status: Abnormal    Collection Time: 08/03/21  7:26 AM  Result Value Ref Range    Glucose-Capillary 110 (H) 70 - 99 mg/dL          PHYSICAL EXAM:    Gen: sitting up in chair, NAD, wife present  Lungs: unlabored Cardiac: RRR Ext:       Right Lower Extremity              Splint c/d/I             Ext warm              Altered sensation to dorsum and plantar R foot (symmetric on L side)             EHL, FHL, lesser toe motor grossly intact             No pain out of proportion with passive stretching of toes        Assessment/Plan: 2 Days Post-Op      Anti-infectives (From admission, onward)        Start     Dose/Rate Route Frequency Ordered Stop  08/01/21 2200   ceFAZolin (ANCEF) IVPB 2g/100 mL premix        2 g 200 mL/hr over 30 Minutes Intravenous Every 8 hours 08/01/21 1757 08/02/21 1932    08/01/21 1400   ceFAZolin (ANCEF) IVPB 2g/100 mL premix        2 g 200 mL/hr over 30 Minutes Intravenous  Once 08/01/21 1355 08/01/21 1420    08/01/21 1356   ceFAZolin (ANCEF) 2-4 GM/100ML-% IVPB       Note to Pharmacy: Roosvelt Maser N: cabinet override         08/01/21 1356 08/01/21 1422         .   POD/HD#: 2     61 y/o male s/p ground level fall with acute R syndesmotic disruption, chronic talonavicular dislocation/charcot neuropathy    -R syndesmotic disruption (maisonneuve fracture), R foot charcot neuropathy with talonavicular dislocation s/p repair of R syndesmosis and  CRPP R talonavicular joint              Nonweightbearing right leg for 8 weeks             Splint for 2 weeks and then convert to cam boot             Explicitly discussed the importance of maintaining nonweightbearing on his right leg for the duration.  He reports peripheral neuropathy in both feet for many years but does not have a regular provider other than  his primary care provider.  We will likely refer him to Dr. 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, DM, nicotine use               Given medical history I do believe it is clinically relevant to obtain DEXA scan              Would also recommend testosterone panel    - Activity:             NWB R leg   - FEN/GI prophylaxis/Foley/Lines:             Carb mod diet   -Ex-fix/Splint care:             Keep splint clean and dry x 2 weeks, do not remove   -  Impediments to fracture healing:             Nicotine use             DM             Autoimmune disease and associated meds             Vitamin d deficiency  - Dispo:             dc home today              Follow up in 10-14 days     Disposition: Discharge disposition: 01-Home or Self Care       Discharge Instructions     Call MD / Call 911   Complete by: As directed    If you experience chest pain or shortness of breath, CALL 911 and be transported to the hospital emergency room.  If you develope a fever above 101 F, pus (white drainage) or increased drainage or redness at the wound, or calf pain, call your surgeon's office.   Constipation Prevention   Complete by: As directed    Drink plenty of fluids.  Prune juice may be helpful.  You may use a stool softener, such as Colace (over the counter) 100 mg twice a day.  Use MiraLax (over the counter) for constipation as needed.   Diet Carb Modified   Complete  by: As directed    Discharge instructions   Complete by: As directed    Orthopaedic Trauma Service Discharge Instructions   General Discharge Instructions  Orthopaedic Injuries:  Right ankle syndesmotic disruption with right proximal fibula fracture treated with repair of syndesmosis  Right Charcot foot dislocation treated with closed reduction and percutaneous pinning  WEIGHT BEARING STATUS: Nonweightbearing right leg, mobilize with walker  RANGE OF MOTION/ACTIVITY: No ankle range of motion as you are splinted.  Do not remove splint.  Activity as tolerated while maintaining weightbearing restrictions  Bone health: Labs show significant vitamin D deficiency.  Please take vitamin D supplements that have been prescribed for you.    Review the following resource for additional information regarding bone health  [email or web address]  Wound Care: Do not remove splint.  Do not get splint wet  DVT/PE prophylaxis: Xarelto 15 mg daily x 30 days  Diet: as you were eating previously.  Can use over the counter stool softeners and bowel preparations, such as Miralax, to help with bowel movements.  Narcotics can be constipating.  Be sure to drink plenty of fluids  PAIN MEDICATION USE AND EXPECTATIONS  You have likely been given narcotic medications to help control your pain.  After a traumatic event that results in an fracture (broken bone) with or without surgery, it is ok to use narcotic pain medications to help control one's pain.  We understand that everyone responds to pain differently and each individual patient will be evaluated on a regular basis for the continued need for narcotic medications. Ideally, narcotic medication use should last no more than 6-8 weeks (coinciding with fracture healing).   As a patient it is your responsibility as well to monitor narcotic medication use and report the amount and frequency you use these medications when you come to your office  visit.   We would also advise that if you are using narcotic medications, you should take a dose prior to therapy to maximize you participation.  IF YOU ARE ON NARCOTIC MEDICATIONS IT IS NOT PERMISSIBLE TO OPERATE A MOTOR VEHICLE (MOTORCYCLE/CAR/TRUCK/MOPED) OR  HEAVY MACHINERY DO NOT MIX NARCOTICS WITH OTHER CNS (CENTRAL NERVOUS SYSTEM) DEPRESSANTS SUCH AS ALCOHOL   POST-OPERATIVE OPIOID TAPER INSTRUCTIONS:  It is important to wean off of your opioid medication as soon as possible. If you do not need pain medication after your surgery it is ok to stop day one.  Opioids include:  o Codeine, Hydrocodone(Norco, Vicodin), Oxycodone(Percocet, oxycontin) and hydromorphone amongst others.   Long term and even short term use of opiods can cause:  o Increased pain response  o Dependence  o Constipation  o Depression  o Respiratory depression  o And more.   Withdrawal symptoms can include  o Flu like symptoms  o Nausea, vomiting  o And more  Techniques to manage these symptoms  o Hydrate well  o Eat regular healthy meals  o Stay active  o Use relaxation techniques(deep breathing, meditating, yoga)  Do Not substitute Alcohol to help with tapering  If you have been on opioids for less than two weeks and do not have pain than it is ok to stop all together.   Plan to wean off of opioids  o This plan should start within one week post op of your fracture surgery   o Maintain the same interval or time between taking each dose and first decrease the dose.   o Cut the total daily intake of opioids by one tablet each day  o Next start to increase the time between doses.  o The last dose that should be eliminated is the evening dose.    STOP SMOKING OR USING NICOTINE PRODUCTS!!!!  As discussed nicotine severely impairs your body's ability to heal surgical and traumatic wounds but also impairs bone healing.  Wounds and bone heal by forming microscopic blood vessels (angiogenesis) and nicotine  is a vasoconstrictor (essentially, shrinks blood vessels).  Therefore, if vasoconstriction occurs to these microscopic blood vessels they essentially disappear and are unable to deliver necessary nutrients to the healing tissue.  This is one modifiable factor that you can do to dramatically increase your chances of healing your injury.    (This means no smoking, no nicotine gum, patches, etc)  DO NOT USE NONSTEROIDAL ANTI-INFLAMMATORY DRUGS (NSAID'S)  Using products such as Advil (ibuprofen), Aleve (naproxen), Motrin (ibuprofen) for additional pain control during fracture healing can delay and/or prevent the healing response.  If you would like to take over the counter (OTC) medication, Tylenol (acetaminophen) is ok.  However, some narcotic medications that are given for pain control contain acetaminophen as well. Therefore, you should not exceed more than 4000 mg of tylenol in a day if you do not have liver disease.  Also note that there are may OTC medicines, such as cold medicines and allergy medicines that my contain tylenol as well.  If you have any questions about medications and/or interactions please ask your doctor/PA or your pharmacist.      ICE AND ELEVATE INJURED/OPERATIVE EXTREMITY  Using ice and elevating the injured extremity above your heart can help with swelling and pain control.  Icing in a pulsatile fashion, such as 20 minutes on and 20 minutes off, can be followed.    Do not place ice directly on skin. Make sure there is a barrier between to skin and the ice pack.    Using frozen items such as frozen peas works well as the conform nicely to the are that needs to be iced.  USE AN ACE WRAP OR TED HOSE FOR SWELLING CONTROL  In addition to  icing and elevation, Ace wraps or TED hose are used to help limit and resolve swelling.  It is recommended to use Ace wraps or TED hose until you are informed to stop.    When using Ace Wraps start the wrapping distally (farthest away from the body)  and wrap proximally (closer to the body)   Example: If you had surgery on your leg or thing and you do not have a splint on, start the ace wrap at the toes and work your way up to the thigh        If you had surgery on your upper extremity and do not have a splint on, start the ace wrap at your fingers and work your way up to the upper arm  IF YOU ARE IN A SPLINT OR CAST DO NOT [place]   If your splint gets wet for any reason please contact the office immediately. You may shower in your splint or cast as long as you keep it dry.  This can be done by wrapping in a cast cover or garbage back (or similar)  Do Not stick any thing down your splint or cast such as pencils, money, or hangers to try and scratch yourself with.  If you feel itchy take benadryl as prescribed on the bottle for itching  IF YOU ARE IN A CAM BOOT (BLACK BOOT)  You may remove boot periodically. Perform daily dressing changes as noted below.  Wash the liner of the boot regularly and wear a sock when wearing the boot. It is recommended that you sleep in the boot until told otherwise    Call office for the following: ? Temperature greater than 101F ? Persistent nausea and vomiting ? Severe uncontrolled pain ? Redness, tenderness, or signs of infection (pain, swelling, redness, odor or green/yellow discharge around the site) ? Difficulty breathing, headache or visual disturbances ? Hives ? Persistent dizziness or light-headedness ? Extreme fatigue ? Any other questions or concerns you may have after discharge  In an emergency, call 911 or go to an Emergency Department at a nearby hospital  HELPFUL INFORMATION  ? If you had a block, it will wear off between 8-24 hrs postop typically.  This is period when your pain may go from nearly zero to the pain you would have had postop without the block.  This is an abrupt transition but nothing dangerous is happening.  You may take an extra dose of narcotic when this  happens.  ? You should wean off your narcotic medicines as soon as you are able.  Most patients will be off or using minimal narcotics before their first postop appointment.   ? We suggest you use the pain medication the first night prior to going to bed, in order to ease any pain when the anesthesia wears off. You should avoid taking pain medications on an empty stomach as it will make you nauseous.  ? Do not drink alcoholic beverages or take illicit drugs when taking pain medications.  ? In most states it is against the law to drive while you are in a splint or sling.  And certainly against the law to drive while taking narcotics.  ? You may return to work/school in the next couple of days when you feel up to it.   ? Pain medication may make you constipated.  Below are a few solutions to try in this order:   ? Decrease the amount of pain medication if you  aren't having pain.   ? Drink lots of decaffeinated fluids.   ? Drink prune juice and/or each dried prunes   o If the first 3 don't work start with additional solutions   ? Take Colace - an over-the-counter stool softener   ? Take Senokot - an over-the-counter laxative   ? Take Miralax - a stronger over-the-counter laxative     CALL THE OFFICE WITH ANY QUESTIONS OR CONCERNS: [phone]   VISIT OUR WEBSITE FOR ADDITIONAL INFORMATION: [email or web address]   Driving restrictions   Complete by: As directed    No driving for 9 weeks   Increase activity slowly as tolerated   Complete by: As directed    Non weight bearing   Complete by: As directed    Laterality: right   Extremity: Lower   Post-operative opioid taper instructions:   Complete by: As directed    POST-OPERATIVE OPIOID TAPER INSTRUCTIONS: It is important to wean off of your opioid medication as soon as possible. If you do not need pain medication after your surgery it is ok to stop day one. Opioids include: Codeine, Hydrocodone(Norco, Vicodin), Oxycodone(Percocet,  oxycontin) and hydromorphone amongst others.  Long term and even short term use of opiods can cause: Increased pain response Dependence Constipation Depression Respiratory depression And more.  Withdrawal symptoms can include Flu like symptoms Nausea, vomiting And more Techniques to manage these symptoms Hydrate well Eat regular healthy meals Stay active Use relaxation techniques(deep breathing, meditating, yoga) Do Not substitute Alcohol to help with tapering If you have been on opioids for less than two weeks and do not have pain than it is ok to stop all together.  Plan to wean off of opioids This plan should start within one week post op of your joint replacement. Maintain the same interval or time between taking each dose and first decrease the dose.  Cut the total daily intake of opioids by one tablet each day Next start to increase the time between doses. The last dose that should be eliminated is the evening dose.         Allergies as of 08/03/2021       Reactions   Green Dyes Hives   Propranolol    Nausea/vomiting   Latex Rash   Powder in gloves   Zithromax [azithromycin Dihydrate] Rash        Medication List     STOP taking these medications    ibuprofen 200 MG tablet Commonly known as: ADVIL       TAKE these medications    acetaminophen 500 MG tablet Commonly known as: TYLENOL Take 1 tablet (500 mg total) by mouth every 12 (twelve) hours.   amLODipine 5 MG tablet Commonly known as: NORVASC Take 1 tablet (5 mg total) by mouth daily.   ascorbic acid 500 MG tablet Commonly known as: VITAMIN C Take 2 tablets (1,000 mg total) by mouth daily. Start taking on: August 04, 2021   aspirin EC 81 MG tablet Take 81 mg by mouth daily. Swallow whole.   atorvastatin 80 MG tablet Commonly known as: LIPITOR Take 1 tablet (80 mg total) by mouth daily.   calcium citrate 950 (200 Ca) MG tablet Commonly known as: CALCITRATE - dosed in mg elemental  calcium Take 1 tablet (200 mg of elemental calcium total) by mouth 2 (two) times daily.   Cholecalciferol 125 MCG (5000 UT) Tabs Take 1 tablet by mouth daily.   clobetasol cream 0.05 % Commonly known as: TEMOVATE Apply 1 application topically  2 (two) times daily.   docusate sodium 100 MG capsule Commonly known as: COLACE Take 1 capsule (100 mg total) by mouth 2 (two) times daily.   Foltanx RF 3-90.314-2-35 MG Caps Take 1 tablet by mouth in the morning and at bedtime.   furosemide 20 MG tablet Commonly known as: LASIX Take 1 tablet (20 mg total) by mouth daily.   hydrochlorothiazide 25 MG tablet Commonly known as: HYDRODIURIL Take 1 tablet (25 mg total) by mouth daily.   icosapent Ethyl 1 g capsule Commonly known as: Vascepa Take 2 capsules (2 g total) by mouth 2 (two) times daily.   imipramine 50 MG tablet Commonly known as: TOFRANIL TAKE 4 TABLETS (200 MG TOTAL) BY MOUTH AT BEDTIME.   methocarbamol 500 MG tablet Commonly known as: ROBAXIN Take 1-2 tablets (500-1,000 mg total) by mouth every 8 (eight) hours as needed for muscle spasms.   MULTIVITAMIN ADULT PO Take 1 tablet by mouth daily.   olmesartan 20 MG tablet Commonly known as: BENICAR Take 1 tablet (20 mg total) by mouth daily.   Otezla 30 MG Tabs Generic drug: Apremilast Take 1 tablet (30 mg total) by mouth in the morning and at bedtime.   oxyCODONE-acetaminophen 5-325 MG tablet Commonly known as: Percocet Take 1-2 tablets by mouth every 6 (six) hours as needed for severe pain or moderate pain.   Pen Needles 32G X 4 MM Misc Inject 1 Units into the skin daily.   pregabalin 75 MG capsule Commonly known as: Lyrica Take 1 capsule (75 mg total) by mouth 2 (two) times daily.   primidone 50 MG tablet Commonly known as: MYSOLINE TAKE 1 TABLET BY MOUTH EVERY DAY What changed:  how much to take how to take this when to take this additional instructions   Repatha 140 MG/ML Sosy Generic drug:  Evolocumab Inject 140 mg into the skin every 14 (fourteen) days.   Rivaroxaban 15 MG Tabs tablet Commonly known as: XARELTO Take 1 tablet (15 mg total) by mouth daily.   Ozempic (1 MG/DOSE) 4 MG/3ML Sopn Generic drug: Semaglutide (1 MG/DOSE) Inject 1 mg into the skin every 'Sunday.   Semaglutide (2 MG/DOSE) 8 MG/3ML Sopn Inject 2 mg as directed once a week.   Toujeo Max SoloStar 300 UNIT/ML Solostar Pen Generic drug: insulin glargine (2 Unit Dial) INJECT 25 UNITS AT BEDTIME. INCREASE BY 2 UNITS DAILY UNTIL FASTING SUGARS ARE BETWEEN 90-130 What changed: See the new instructions.   triamcinolone 0.5%-Eucerin equivalent 1:1 cream mixture Apply topically 2 (two) times daily. Mix 0.5% triamcinolone cream and Eucerin in a one-to-one mixture.   Vitamin D (Ergocalciferol) 1.25 MG (50000 UNIT) Caps capsule Commonly known as: DRISDOL Take 1 capsule (50,000 Units total) by mouth every 7 (seven) days. Start taking on: August 09, 2021   Xigduo XR 02-999 MG Tb24 Generic drug: Dapagliflozin-metFORMIN HCl ER Take 1 tablet by mouth daily.   Zinc Sulfate 220 (50 Zn) MG Tabs Take 1 tablet (220 mg total) by mouth daily. Start taking on: August 04, 2021               Discharge Care Instructions  (From admission, onward)           Start     Ordered   08/03/21 0000  Non weight bearing       Question Answer Comment  Laterality right   Extremity Lower      03'$ /08/23 1116            Follow-up Information  Altamese [place], MD. Schedule an appointment as soon as possible for a visit in 2 day(s).   Specialty: Orthopedic Surgery Contact information: [place] [address] [phone]                 Discharge Instructions and Plan:  61 y/o male s/p ground level fall with acute R syndesmotic disruption, chronic talonavicular dislocation/charcot neuropathy   Weightbearing: NWB RLE Insicional and dressing care: Dressings left intact until  follow-up Orthopedic device(s): Splint and walker Showering: Okay to shower provided to keep splint clean and dry VTE prophylaxis: Xarelto '15mg'$   daily x 30 days Pain control: multimodal: tylenol, percocet, robaxin Bone Health/Optimization: Severe vitamin D deficiency.  Supplement with vitamin D2 50,000 IUs weekly and vitamin D3 5000 IUs daily. Follow - up plan: 2 weeks Contact information:  Altamese Pollock MD, Ainsley Spinner PA-C   Signed:  Jari Pigg, PA-C [phone] (C) 08/03/2021, 11:18 AM  [facility] [place] [id number] [phone] Domingo Sep (F)

## 2021-08-03 NOTE — Progress Notes (Signed)
Mobility Specialist: Progress Note ? ? 08/03/21 1323  ?Mobility  ?Activity Ambulated with assistance in room  ?Level of Assistance Minimal assist, patient does 75% or more  ?Assistive Device Front wheel walker  ?RLE Weight Bearing NWB  ?Distance Ambulated (ft) 40 ft ?(20'x2)  ?Activity Response Tolerated well  ?$Mobility charge 1 Mobility  ? ?Pt received sitting EOB and agreeable to ambulation. Focused on slowing down and controlling movements with the RW using verbal cues and physical help to assist. Pt sitting EOB after session with call bell in reach. Family present in the room.  ? ?Harrell Gave Evaristo Tsuda ?Mobility Specialist ?Mobility Specialist [place]: [phone] ?Mobility Specialist [place]: [phone] ? ?

## 2021-08-03 NOTE — Progress Notes (Signed)
Pt discharged home. Pt educated on all discharge information with no questions at this time. PIV removed, TOC pharmacy medications delivered to room, pt has RW at home, and plans to get [facility] at [place]. All belongings packed and taken out with pt. ? ?Justice Rocher, RN ? ?

## 2021-08-03 NOTE — Progress Notes (Signed)
? ?                              [facility] Progress Note ? ?Patient ID: ?Robert Frey ?MRN: [id number] ?DOB/AGE: 11-05-1960 61 y.o. ? ?Subjective: ? ?No acute issues ?Wants to go home today  ? ?Has walker at home ? ? ?ROS ?As above ? ?Objective:  ? ?VITALS:   ?Vitals:  ? 08/02/21 1938 08/02/21 2336 08/03/21 0401 08/03/21 0729  ?BP: (!) 116/54 115/61 106/65 113/66  ?Pulse: 92 86 73 77  ?Resp: '20  20 18  '$ ?Temp: 98.7 ?F (37.1 ?C) 97.8 ?F (36.6 ?C) 97.6 ?F (36.4 ?C) 97.8 ?F (36.6 ?C)  ?TempSrc:   Oral Oral  ?SpO2: 98% 95% 92% 99%  ?Weight:      ?Height:      ? ? ?Estimated body mass index is 32.19 kg/m? as calculated from the following: ?  Height as of this encounter: '5\' 11"'$  (1.803 m). ?  Weight as of this encounter: 104.7 kg. ? ? ?Intake/Output   ?   03/07 0701 ?03/08 0700 03/08 0701 ?03/09 0700  ? P.O. 120 240  ? I.V. (mL/kg)    ? IV Piggyback    ? Total Intake(mL/kg) 120 (1.1) 240 (2.3)  ? Urine (mL/kg/hr) 2425 (1)   ? Blood    ? Total Output 2425   ? Net -2305 +240  ?     ?  ? ?LABS ? ?Results for orders placed or performed during the hospital encounter of 08/01/21 (from the past 24 hour(s))  ?Glucose, capillary     Status: Abnormal  ? Collection Time: 08/02/21 12:27 PM  ?Result Value Ref Range  ? Glucose-Capillary 172 (H) 70 - 99 mg/dL  ?Glucose, capillary     Status: Abnormal  ? Collection Time: 08/02/21  3:16 PM  ?Result Value Ref Range  ? Glucose-Capillary 167 (H) 70 - 99 mg/dL  ?Glucose, capillary     Status: Abnormal  ? Collection Time: 08/02/21  7:39 PM  ?Result Value Ref Range  ? Glucose-Capillary 183 (H) 70 - 99 mg/dL  ?Glucose, capillary     Status: Abnormal  ? Collection Time: 08/02/21 11:38 PM  ?Result Value Ref Range  ? Glucose-Capillary 141 (H) 70 - 99 mg/dL  ?Glucose, capillary     Status: Abnormal  ? Collection Time: 08/03/21  4:01 AM  ?Result Value Ref Range  ? Glucose-Capillary 152 (H) 70 - 99 mg/dL  ?Basic metabolic panel     Status:  Abnormal  ? Collection Time: 08/03/21  6:53 AM  ?Result Value Ref Range  ? Sodium 129 (L) 135 - 145 mmol/L  ? Potassium 4.0 3.5 - 5.1 mmol/L  ? Chloride 91 (L) 98 - 111 mmol/L  ? CO2 28 22 - 32 mmol/L  ? Glucose, Bld 100 (H) 70 - 99 mg/dL  ? BUN 29 (H) 6 - 20 mg/dL  ? Creatinine, Ser 1.19 0.61 - 1.24 mg/dL  ? Calcium 9.3 8.9 - 10.3 mg/dL  ? GFR, Estimated >60 >60 mL/min  ? Anion gap 10 5 - 15  ?CBC     Status: Abnormal  ? Collection Time: 08/03/21  6:53 AM  ?Result Value Ref Range  ? WBC 9.9 4.0 - 10.5 K/uL  ? RBC 3.40 (L) 4.22 - 5.81 MIL/uL  ? Hemoglobin 11.6 (L) 13.0 - 17.0 g/dL  ? HCT 32.7 (L) 39.0 - 52.0 %  ? MCV 96.2 80.0 -  100.0 fL  ? MCH 34.1 (H) 26.0 - 34.0 pg  ? MCHC 35.5 30.0 - 36.0 g/dL  ? RDW 12.9 11.5 - 15.5 %  ? Platelets 224 150 - 400 K/uL  ? nRBC 0.0 0.0 - 0.2 %  ?Glucose, capillary     Status: Abnormal  ? Collection Time: 08/03/21  7:26 AM  ?Result Value Ref Range  ? Glucose-Capillary 110 (H) 70 - 99 mg/dL  ? ? ? ?PHYSICAL EXAM:  ? ?Gen: sitting up in chair, NAD, wife present  ?Lungs: unlabored ?Cardiac: RRR ?Ext:  ?     Right Lower Extremity  ?            Splint c/d/I ?            Ext warm  ?            Altered sensation to dorsum and plantar R foot (symmetric on L side) ?            EHL, FHL, lesser toe motor grossly intact ?            No pain out of proportion with passive stretching of toes  ?  ?  ? ?Assessment/Plan: ?2 Days Post-Op  ? ? ?Anti-infectives (From admission, onward)  ? ? Start     Dose/Rate Route Frequency Ordered Stop  ? 08/01/21 2200  ceFAZolin (ANCEF) IVPB 2g/100 mL premix       ? 2 g ?200 mL/hr over 30 Minutes Intravenous Every 8 hours 08/01/21 1757 08/02/21 1932  ? 08/01/21 1400  ceFAZolin (ANCEF) IVPB 2g/100 mL premix       ? 2 g ?200 mL/hr over 30 Minutes Intravenous  Once 08/01/21 1355 08/01/21 1420  ? 08/01/21 1356  ceFAZolin (ANCEF) 2-4 GM/100ML-% IVPB       ?Note to Pharmacy: Roosvelt Maser N: cabinet override  ?    08/01/21 1356 08/01/21 1422  ? ?  ?. ? ?POD/HD#: 2 ? ?   ?61 y/o male s/p ground level fall with acute R syndesmotic disruption, chronic talonavicular dislocation/charcot neuropathy  ?  ?-R syndesmotic disruption (maisonneuve fracture), R foot charcot neuropathy with talonavicular dislocation s/p repair of R syndesmosis and  CRPP R talonavicular joint  ?            Nonweightbearing right leg for 8 weeks ?            Splint for 2 weeks and then convert to cam boot ?            Explicitly discussed the importance of maintaining nonweightbearing on his right leg for the duration.  He reports peripheral neuropathy in both feet for many years but does not have a regular provider other than his primary care provider.  We will likely refer him to Dr. Sharol Given once he has healed his syndesmosis and pins have been removed  ?  ?            PT/OT ? ? Will need to reschedule ABIs to be done in about 2-3 weeks  ?  ?- Pain management: ?            Multimodal ?  ?- ABL anemia/Hemodynamics ?            Stable ?  ?- Medical issues  ?            Home meds  ?  ?- DVT/PE prophylaxis: ?            Dc on  xarelto daily x 30 days ?  ?- ID:  ?            Periop abx completed  ?  ?- Metabolic Bone Disease: ?            Vitamin d deficiency  ?                        Supplement  ?            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 ? Given medical history I do believe it is clinically relevant to obtain DEXA scan  ? Would also recommend testosterone panel  ?  ?- Activity: ?            NWB R leg ? ?- FEN/GI prophylaxis/Foley/Lines: ?            Carb mod diet ?  ?-Ex-fix/Splint care: ?            Keep splint clean and dry x 2 weeks, do not remove ?  ?- Impediments to fracture healing: ?            Nicotine use ?            DM ?            Autoimmune disease and associated meds ?            Vitamin d deficiency  ?- Dispo: ?            dc home today  ? Follow up in 10-14 days  ?  ? ?Jari Pigg, PA-C ?[phone] (C) ?08/03/2021, 10:42 AM ? ?[facility] ?[place] [address] ?[phone] Jenetta Downer) ?[phone] (F) ? ? ? ?After 5pm and on the weekends please log on to Amion, go to orthopaedics and the look under the [facility] Call for the provider(s) on call. You can also call our office at [phone] and then follow the prompts to be connected to the call team.  ? Patient ID: Robert Frey, male   DOB: 30-Jan-1961, 61 y.o.   MRN: [id number] ? ?

## 2021-08-03 NOTE — Progress Notes (Signed)
Physical Therapy Treatment ?Patient Details ?Name: Robert Frey ?MRN: [id number] ?DOB: [birth year] ?Today's Date: 08/03/2021 ? ? ?History of Present Illness Pt is 61 yo male with ground level fall with R ankle and knee pain. CT showed complex fx involving Lisfranc and Chopart joint. Also with proximal fibular fx. Underwent  R foot ORIF 08/01/21.  PMH: ankylosing spondylitis, COPD, DM, HT ? ?  ?PT Comments  ? ? Pt tolerates treatment well, benefiting from verbal cues to slow gait cadence to improve safety. Pt demonstrates improved tolerance for ambulation and is able to negotiate one step to enter home with use of walker. PT recommends discharge home when medically ready.  ?Recommendations for follow up therapy are one component of a multi-disciplinary discharge planning process, led by the attending physician.  Recommendations may be updated based on patient status, additional functional criteria and insurance authorization. ? ?Follow Up Recommendations ? Outpatient PT (when allowed to [facility]) ?  ?  ?Assistance Recommended at Discharge Intermittent Supervision/Assistance  ?Patient can return home with the following A little help with walking and/or transfers;A little help with bathing/dressing/bathroom;Assistance with cooking/housework;Help with stairs or ramp for entrance;Assist for transportation ?  ?Equipment Recommendations ? Rolling walker (2 wheels)  ?  ?Recommendations for Other Services   ? ? ?  ?Precautions / Restrictions Precautions ?Precautions: Fall ?Precaution Comments: impulsive ?Required Braces or Orthoses: Splint/Cast ?Splint/Cast: RLE ?Restrictions ?Weight Bearing Restrictions: Yes ?RLE Weight Bearing: Non weight bearing  ?  ? ?Mobility ? Bed Mobility ?  ?  ?  ?  ?  ?  ?  ?  ?  ? ?Transfers ?Overall transfer level: Needs assistance ?Equipment used: Rolling walker (2 wheels), Crutches ?Transfers: Sit to/from Stand ?Sit to Stand: Supervision ?  ?  ?  ?  ?  ?  ?  ? ?Ambulation/Gait ?Ambulation/Gait assistance:  Min assist, Supervision ?Gait Distance (Feet): 25 Feet (25' with crutches, 10' with RW) ?Assistive device: Rolling walker (2 wheels), Crutches ?Gait Pattern/deviations:  (hop-to gait) ?Gait velocity: reduced ?Gait velocity interpretation: <1.8 ft/sec, indicate of risk for recurrent falls ?  ?General Gait Details: 45' with crutches, minA due to 2 losses of balance, verbal cues to slow cadence. 10' with RW at supervision level ? ? ?Stairs ?Stairs: Yes ?Stairs assistance: Min assist, Min guard ?Stair Management: No rails, Backwards, With walker ?Number of Stairs: 1 ?General stair comments: pt hops backward up one step for 2 trials, minA initial trial and minG 2nd trial ? ? ?Wheelchair Mobility ?  ? ?Modified Rankin (Stroke Patients Only) ?  ? ? ?  ?Balance Overall balance assessment: Needs assistance ?Sitting-balance support: No upper extremity supported, Feet supported ?Sitting balance-Leahy Scale: Normal ?  ?  ?Standing balance support: Bilateral upper extremity supported, Reliant on assistive device for balance ?Standing balance-Leahy Scale: Poor ?  ?  ?  ?  ?  ?  ?  ?  ?  ?  ?  ?  ?  ? ?  ?Cognition Arousal/Alertness: Awake/alert ?Behavior During Therapy: [facility] for tasks assessed/performed ?Overall Cognitive Status: Within Functional Limits for tasks assessed ?  ?  ?  ?  ?  ?  ?  ?  ?  ?  ?  ?  ?  ?  ?  ?  ?  ?  ?  ? ?  ?Exercises   ? ?  ?General Comments General comments (skin integrity, edema, etc.): VSS on RA ?  ?  ? ?Pertinent Vitals/Pain Pain Assessment ?Pain Assessment: Faces ?Faces Pain Scale:  Hurts even more ?Pain Location: shoulders ?Pain Descriptors / Indicators: Aching ?Pain Intervention(s): Monitored during session  ? ? ?Home Living   ?  ?  ?  ?  ?  ?  ?  ?  ?  ?   ?  ?Prior Function    ?  ?  ?   ? ?PT Goals (current goals can now be found in the care plan section) Acute Rehab PT Goals ?Patient Stated Goal: return home and to work ?Progress towards PT goals: Progressing toward goals ? ?  ?Frequency ? ? ?  Min 5X/week ? ? ? ?  ?PT Plan Current plan remains appropriate  ? ? ?Co-evaluation   ?  ?  ?  ?  ? ?  ?AM-PAC PT "6 Clicks" Mobility   ?Outcome Measure ? Help needed turning from your back to your side while in a flat bed without using bedrails?: None ?Help needed moving from lying on your back to sitting on the side of a flat bed without using bedrails?: None ?Help needed moving to and from a bed to a chair (including a wheelchair)?: A Little ?Help needed standing up from a chair using your arms (e.g., wheelchair or bedside chair)?: A Little ?Help needed to walk in hospital room?: A Little ?Help needed climbing 3-5 steps with a railing? : A Lot ?6 Click Score: 19 ? ?  ?End of Session   ?Activity Tolerance: Patient tolerated treatment well ?Patient left: in chair;with call bell/phone within reach ?Nurse Communication: Mobility status ?PT Visit Diagnosis: Unsteadiness on feet (R26.81);Difficulty in walking, not elsewhere classified (R26.2);Pain ?Pain - Right/Left: Right ?Pain - part of body: Ankle and joints of foot;Knee ?  ? ? ?Time: 9379-0240 ?PT Time Calculation (min) (ACUTE ONLY): 32 min ? ?Charges:  $Gait Training: 23-37 mins          ?          ? ?Zenaida Niece, PT, DPT ?Acute Rehabilitation ?Pager: [phone] ?Office [phone] ? ? ? ?Zenaida Niece ?08/03/2021, 11:44 AM ? ?

## 2021-08-04 ENCOUNTER — Ambulatory Visit (HOSPITAL_COMMUNITY)
Admission: RE | Admit: 2021-08-04 | Payer: Commercial Managed Care - PPO | Source: Ambulatory Visit | Attending: Physician Assistant | Admitting: Physician Assistant

## 2021-08-05 ENCOUNTER — Telehealth: Payer: Self-pay | Admitting: General Practice

## 2021-08-05 NOTE — Telephone Encounter (Signed)
Transition Care Management Follow-up Telephone Call ?Date of discharge and from where: 08/03/21 from [facility] ?How have you been since you were released from the hospital? Still in pain. Has an appt with the surgeon in two weeks.  ?Any questions or concerns? No ? ?Items Reviewed: ?Did the pt receive and understand the discharge instructions provided? Yes  ?Medications obtained and verified? No  ?Other? No  ?Any new allergies since your discharge? No  ?Dietary orders reviewed? Yes ?Do you have support at home? Yes  ? ?Home Care and Equipment/Supplies: ?Were home health services ordered? no ? ?Functional Questionnaire: (I = Independent and D = Dependent) ?ADLs: I ? ?Bathing/Dressing- I ? ?Meal Prep- I ? ?Eating- I ? ?Maintaining continence- I ? ?Transferring/Ambulation- I ? ?Managing Meds- I ? ?Follow up appointments reviewed: ? ?PCP Hospital f/u appt confirmed? No   ?Specialist Hospital f/u appt confirmed? Yes  Scheduled to see the surgeon in two weeks. ?Are transportation arrangements needed? No  ?If their condition worsens, is the pt aware to call PCP or go to the Emergency Dept.? Yes ?Was the patient provided with contact information for the PCP's office or ED? Yes ?Was to pt encouraged to call back with questions or concerns? Yes  ?

## 2021-08-10 ENCOUNTER — Encounter (HOSPITAL_BASED_OUTPATIENT_CLINIC_OR_DEPARTMENT_OTHER): Payer: Self-pay

## 2021-08-10 ENCOUNTER — Other Ambulatory Visit (HOSPITAL_BASED_OUTPATIENT_CLINIC_OR_DEPARTMENT_OTHER): Payer: Self-pay

## 2021-08-10 ENCOUNTER — Telehealth (HOSPITAL_BASED_OUTPATIENT_CLINIC_OR_DEPARTMENT_OTHER): Payer: Self-pay

## 2021-08-10 NOTE — Telephone Encounter (Signed)
Transitions of Care Pharmacy  ° °Call attempted for a pharmacy transitions of care follow-up. HIPAA appropriate voicemail was left with call back information provided.  ° °Call attempt #1. Will follow-up in 2-3 days.  °  °

## 2021-08-15 ENCOUNTER — Telehealth (HOSPITAL_COMMUNITY): Payer: Self-pay

## 2021-08-15 ENCOUNTER — Other Ambulatory Visit (HOSPITAL_COMMUNITY): Payer: Self-pay

## 2021-08-15 NOTE — Telephone Encounter (Signed)
Pharmacy Transitions of Care Follow-up Telephone Call ? ?Date of discharge: 08/03/21  ?Discharge Diagnosis: Outpatient ankle surgery ? ?How have you been since you were released from the hospital? Patient is doing well since discharge, no questions about meds at this time.  ? ?Medication changes made at discharge: ?    START taking: ?Acetaminophen Extra Strength (acetaminophen)  ?calcium citrate (CALCITRATE - dosed in mg elemental calcium)  ?docusate sodium (COLACE)  ?methocarbamol (ROBAXIN)  ?oxyCODONE-acetaminophen (Percocet)  ?SM Vitamin D3 (Cholecalciferol)  ?vitamin C (ASCORBIC ACID)  ?Vitamin D (Ergocalciferol) (DRISDOL)  ?Xarelto (Rivaroxaban)  ?Zinc Sulfate  ?STOP taking: ?ibuprofen 200 MG tablet (ADVIL)  ? ?Medication changes verified by the patient? Yes ?  ? ?Medication Accessibility: ? ?Home Pharmacy: Not discussed  ? ?Was the patient provided with refills on discharged medications? No refills needed ? ?Have all prescriptions been transferred from [facility] to home pharmacy? N/A  ? ?Is the patient able to afford medications? Has insurance ?  ? ?Medication Review: ? ?RIVAROXABAN (XARELTO)  ?Rivaroxaban 15 mg QD initiated on 08/03/21.   ?- Discussed importance of taking medication with food and around the same time everyday  ?- Advised patient of medications to avoid (NSAIDs, ASA)  ?- Educated that Tylenol (acetaminophen) will be the preferred analgesic to prevent risk of bleeding  ?- Emphasized importance of monitoring for signs and symptoms of bleeding (abnormal bruising, prolonged bleeding, nose bleeds, bleeding from gums, discolored urine, black tarry stools)  ?- Advised patient to alert all providers of anticoagulation therapy prior to starting a new medication or having a procedure  ? ?Follow-up Appointments: ? ?PCP Hospital f/u appt confirmed? Scheduled to see Dr. Alden Hipp on 09/26/21 @ 8:50am.  ? ?Specialist Hospital f/u appt confirmed? Scheduled to see Cardiology on 09/02/21 @ 3:00pm.  ? ?If their condition  worsens, is the pt aware to call PCP or go to the Emergency Dept.? Yes ? ?Final Patient Assessment: ?Patient has f/u scheduled and no refills needed at this time. ? ?

## 2021-08-17 ENCOUNTER — Other Ambulatory Visit: Payer: Self-pay | Admitting: Physician Assistant

## 2021-08-17 DIAGNOSIS — R600 Localized edema: Secondary | ICD-10-CM

## 2021-08-21 ENCOUNTER — Other Ambulatory Visit: Payer: Self-pay | Admitting: Physician Assistant

## 2021-08-21 DIAGNOSIS — E1129 Type 2 diabetes mellitus with other diabetic kidney complication: Secondary | ICD-10-CM

## 2021-08-23 ENCOUNTER — Other Ambulatory Visit (HOSPITAL_COMMUNITY): Payer: Self-pay

## 2021-09-01 ENCOUNTER — Encounter: Payer: Self-pay | Admitting: *Deleted

## 2021-09-01 DIAGNOSIS — Z006 Encounter for examination for normal comparison and control in clinical research program: Secondary | ICD-10-CM

## 2021-09-01 NOTE — Research (Signed)
Message left on voice mail to inform Mr Deyton of [employer] research study. Encouraged him to call me back.  ?

## 2021-09-02 ENCOUNTER — Ambulatory Visit (HOSPITAL_COMMUNITY)
Admission: RE | Admit: 2021-09-02 | Discharge: 2021-09-02 | Disposition: A | Discharge status: Home / Self Care | Payer: Commercial Managed Care - PPO | Source: Ambulatory Visit | Attending: Cardiology | Admitting: Cardiology

## 2021-09-02 DIAGNOSIS — R0989 Other specified symptoms and signs involving the circulatory and respiratory systems: Secondary | ICD-10-CM | POA: Diagnosis present

## 2021-09-02 DIAGNOSIS — E1129 Type 2 diabetes mellitus with other diabetic kidney complication: Secondary | ICD-10-CM

## 2021-09-02 DIAGNOSIS — R809 Proteinuria, unspecified: Secondary | ICD-10-CM | POA: Insufficient documentation

## 2021-09-02 DIAGNOSIS — Z794 Long term (current) use of insulin: Secondary | ICD-10-CM | POA: Insufficient documentation

## 2021-09-04 NOTE — Progress Notes (Signed)
Great news.  No evidence of any arterial disease in either leg.

## 2021-09-20 NOTE — Op Note (Signed)
NAME: Robert Frey, Robert Frey. ?MEDICAL RECORD NO: [id number] ?ACCOUNT NO: [id number] ?DATE OF BIRTH: 1960/06/03 ?FACILITY: [facility] ?LOCATION: [id number] ?PHYSICIAN: Astrid Divine. Marcelino Scot, MD ? ?Operative Report  ? ?DATE OF PROCEDURE: 08/01/2021 ? ?PREOPERATIVE DIAGNOSES:    ?1.  Right talar head fracture. ?2.  Talonavicular dislocation. ?3.  Suspected ankle instability. ? ?POSTOPERATIVE DIAGNOSES:    ?1.  Right ankle syndesmotic disruption. ?2.  Talar head fracture. ?3.  Talonavicular dislocation. ?4.  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 ? ?PROCEDURES:    ?1.  Open reduction and internal fixation of right ankle syndesmosis. ?2.  Open treatment of right talus fracture. ?3.  Open reduction of talonavicular dislocation. ?4.  Stress fluoroscopy of the right ankle syndesmosis. ? ?SURGEON:  Astrid Divine. Marcelino Scot, MD. ? ?ASSISTANT:  Ainsley Spinner, PA-C. ? ?ANESTHESIA:  General. ? ?COMPLICATIONS:  None. ? ?TOURNIQUET:  None. ? ?SPECIMENS:  None. ? ?DISPOSITION:  To PACU. ? ?CONDITION:  Stable. ? ?BRIEF SUMMARY FOR PROCEDURE: Robert Frey is a 61 year old male with a history of ankylosing spondylitis and neuropathic pain with longstanding complaints of bilateral ankle and foot pain and swelling.  The patient sustained a blow on a door jam last  ?night, resulting in a fall and acute right foot pain and deformity.  Subsequent evaluation demonstrated a talonavicular dislocation with impaction fracture of the talar head.  There were also some chronic changes of the ankle.  I did discuss with the  ?patient the risks and benefits of surgical treatment including the increased possibility of complications including loss of reduction, infection, given his diabetic neuropathy and that complication could result in a deep infection and limb loss.  The  ?patient acknowledged these risks and strongly wished to proceed with reduction. ? ?BRIEF SUMMARY OF PROCEDURE: The patient was taken to the operating room after administration of preoperative antibiotics.  General  anesthesia was induced.  Chlorhexidine wash, Betadine scrub and paint was performed of the operative right extremity.  We  ?began with the ankle where C-arm was brought in and then manual stress was applied during live fluoroscopy with external rotation force to the ankle, which demonstrated significant dynamic widening of the syndesmosis consistent with syndesmotic  ?disruption.  I then turned my attention to the foot where an attempt was made at closed reduction of the talonavicular joint.  I was unable to successfully reduce the joint through a closed means.  Consequently, small incisions were made off the medial  ?aspect of the talus and with a combination of direct manipulation of the talar head, which was disimpacted along the fracture as well as distraction of the navicular and mobilization of the talus proximal to the fracture, so as not to displace the  ?fragment, reduction was obtained.  This was pinned with three 2.0 mm K-wires selecting a more robust wire than normal because of the underlying neuropathy.  Once this was reduced, it was checked on 3- views, three x-ray views and the foot was brought  ?into a plantigrade position and the lateral malleolus approach for placement of a 3-hole plate.  Two syndesmotic anchors were then placed after first securing the plate in optimum position with a single screw and then reducing the syndesmosis directly  ?and holding it in reduced position with a well-placed clamp.  After securing the syndesmosis, the clamp was released and repeat stress views showed maintenance of anatomic reduction there.  ? ?Ainsley Spinner, PA-C, was present and assisting me throughout.  A copious lavage and then a layered closure was performed.  The patient  was placed into a well-padded posterior and stirrup splint and transported to PACU in stable condition. ? ?PROGNOSES:  Patient will be strictly nonweightbearing for the next 6-8 weeks with protected weightbearing to ensue and then likely  subsequent followup with my foot and ankle colleagues for surveillance and evaluation of the other side which fortunately,  ?at this time, is not symptomatic and is well aligned. ? ? ?MUK ?D: 09/19/2021 9:54:50 pm T: 09/20/2021 2:11:00 am  ?JOB: [id number]/ [id number]  ?

## 2021-09-25 ENCOUNTER — Other Ambulatory Visit: Payer: Self-pay | Admitting: Physician Assistant

## 2021-09-25 DIAGNOSIS — R600 Localized edema: Secondary | ICD-10-CM

## 2021-09-26 ENCOUNTER — Ambulatory Visit (INDEPENDENT_AMBULATORY_CARE_PROVIDER_SITE_OTHER): Payer: Commercial Managed Care - PPO | Admitting: Physician Assistant

## 2021-09-26 VITALS — BP 124/58 | HR 91 | Ht 71.0 in | Wt 226.0 lb

## 2021-09-26 DIAGNOSIS — E1129 Type 2 diabetes mellitus with other diabetic kidney complication: Secondary | ICD-10-CM

## 2021-09-26 DIAGNOSIS — I10 Essential (primary) hypertension: Secondary | ICD-10-CM | POA: Diagnosis not present

## 2021-09-26 DIAGNOSIS — R809 Proteinuria, unspecified: Secondary | ICD-10-CM | POA: Diagnosis not present

## 2021-09-26 DIAGNOSIS — M14671 Charcot's joint, right ankle and foot: Secondary | ICD-10-CM | POA: Diagnosis not present

## 2021-09-26 DIAGNOSIS — Z794 Long term (current) use of insulin: Secondary | ICD-10-CM | POA: Diagnosis not present

## 2021-09-26 LAB — POCT GLYCOSYLATED HEMOGLOBIN (HGB A1C): Hemoglobin A1C: 5.7 % — AB (ref 4.0–5.6)

## 2021-09-26 MED ORDER — SEMAGLUTIDE (2 MG/DOSE) 8 MG/3ML ~~LOC~~ SOPN: 2.0000 mg | PEN_INJECTOR | SUBCUTANEOUS | 0 refills | Status: DC

## 2021-09-26 MED ORDER — XIGDUO XR 10-1000 MG PO TB24: 1.0000 | ORAL_TABLET | Freq: Every day | ORAL | 2 refills | Status: DC

## 2021-09-26 NOTE — Progress Notes (Signed)
? ?Established Patient Office Visit ? ?Subjective   ?Patient ID: Robert Frey, male    DOB: 05-13-1961  Age: 61 y.o. MRN: [id number] ? ?Chief Complaint  ?Patient presents with  ? Diabetes  ? ? ?HPI ?Pt is a 61 yo male who presents to the clinic with his wife to follow up on T2DM and medication management. Pt has been dx with charot's joint of right foot and has had one surgery on 08/01/2021.  He is working 2 days a week and wearing a boot. He is waiting on specialist appt for his foot.  ? ?His wife is cooking more and has been doing better on his diet. He is taking still about 25 units at night on Toujeo, Ozempic '1mg'$  weekly, and xigduo daily for sugars. No hypoglycemic events. He feels like most of his sugars in the morning are still about 130. No open sores or wounds. No CP, palpitations, headaches or vision changes.  ? ? ?Patient Active Problem List  ? Diagnosis Date Noted  ? Charcot's joint of right foot 08/03/2021  ? Syndesmotic disruption of right ankle 08/01/2021  ? Family history of blood clots 07/26/2021  ? Leg edema, right 07/26/2021  ? Right leg pain 07/26/2021  ? Absent pedal pulses 07/13/2021  ? Acute right ankle pain 07/13/2021  ? Diabetic foot infection ([place]) 06/28/2021  ? Psoriasis 06/28/2021  ? Acute left-sided low back pain without sciatica 06/07/2021  ? Chronic foot pain, right 05/03/2021  ? Rib pain on left side 03/07/2021  ? Fall 03/07/2021  ? Family history of Alzheimer's disease 11/09/2020  ? Diabetic neuropathy ([place]) 01/28/2020  ? Current smoker 10/22/2019  ? Nosebleed 10/22/2019  ? Primary osteoarthritis, right glenohumeral joint 07/02/2019  ? Shoulder separation, right, subsequent encounter 06/17/2019  ? Essential tremor 04/16/2019  ? Shakes 04/04/2019  ? Dark urine 01/01/2019  ? Lumbosacral strain 01/17/2017  ? Bruising 06/09/2015  ? Essential hypertension 06/08/2015  ? Microalbuminuria 06/08/2015  ? Lung nodule < 6cm on CT 05/15/2014  ? Heel spur 01/20/2014  ? Osteoarthritis of foot, left  01/20/2014  ? Psoriatic arthritis ([place]) 10/11/2012  ? Type 2 diabetes mellitus with microalbuminuria, with long-term current use of insulin ([place]) 10/11/2012  ? Ankylosing spondylitis of multiple sites in spine ([place]) 09/30/2009  ? HYPERCHOLESTEROLEMIA 03/06/2006  ? OBESITY, NOS 03/06/2006  ? OBSESSIVE COMPUL. DISORDER 03/06/2006  ? ?Past Medical History:  ?Diagnosis Date  ? Ankylosing spondylitis ([place])   ? COPD (chronic obstructive pulmonary disease) ([place])   ? Diabetes mellitus without complication ([place])   ? High cholesterol   ? Hypertension   ? ?Allergies  ?Allergen Reactions  ? Green Dyes Hives  ? Propranolol   ?  Nausea/vomiting  ? Latex Rash  ?  Powder in gloves  ? Zithromax [Azithromycin Dihydrate] Rash  ? ?  ? ?ROS ?See HPI.  ?  ?Objective:  ?  ? ?BP (!) 124/58   Pulse 91   Ht '5\' 11"'$  (1.803 m)   Wt 226 lb (102.5 kg)   SpO2 98%   BMI 31.52 kg/m?  ?BP Readings from Last 3 Encounters:  ?09/26/21 (!) 124/58  ?08/03/21 99/67  ?07/26/21 (!) 152/77  ? ?Wt Readings from Last 3 Encounters:  ?09/26/21 226 lb (102.5 kg)  ?08/01/21 230 lb 13.2 oz (104.7 kg)  ?07/26/21 223 lb (101.2 kg)  ? ?  ? ?Physical Exam ?Vitals reviewed.  ?Constitutional:   ?   Appearance: Normal appearance. He is obese.  ?HENT:  ?  Head: Normocephalic.  ?Cardiovascular:  ?   Rate and Rhythm: Normal rate and regular rhythm.  ?   Pulses: Normal pulses.  ?   Heart sounds: Normal heart sounds.  ?Pulmonary:  ?   Effort: Pulmonary effort is normal.  ?   Breath sounds: Normal breath sounds.  ?Musculoskeletal:  ?   Right lower leg: No edema.  ?   Left lower leg: No edema.  ?Neurological:  ?   General: No focal deficit present.  ?   Mental Status: He is alert and oriented to person, place, and time.  ?Psychiatric:     ?   Mood and Affect: Mood normal.  ?Right foot in boot ? ? ?.. ?Results for orders placed or performed in visit on 09/26/21  ?POCT glycosylated hemoglobin (Hb A1C)  ?Result Value Ref Range  ? Hemoglobin A1C 5.7 (A) 4.0 - 5.6 %  ? HbA1c POC  (<> result, manual entry)    ? HbA1c, POC (prediabetic range)    ? HbA1c, POC (controlled diabetic range)    ? ? ? ?The 10-year ASCVD risk score (Arnett DK, et al., 2019) is: 21.6% ? ?  ?Assessment & Plan:  ?..Shine was seen today for diabetes. ? ?Diagnoses and all orders for this visit: ? ?Type 2 diabetes mellitus with microalbuminuria, with long-term current use of insulin ([name]) ?-     POCT glycosylated hemoglobin (Hb A1C) ?-     Semaglutide, 2 MG/DOSE, 8 MG/3ML SOPN; Inject 2 mg as directed once a week. ?-     Dapagliflozin-metFORMIN HCl ER (XIGDUO XR) 02-999 MG TB24; Take 1 tablet by mouth daily. ? ?Essential hypertension ? ?Charcot's joint of right foot ? ?A1C looks great ?Continue on same medications ?Increased ozempic to '2mg'$  to hopefully start coming down on insulin at night ?Continue xigduo ?BP to goal. On ARB ?On statin ?Eye exam UTD ?See podiatry for foot exam ?Covid vaccine x4 ?Pneumonia and flu UTD ? ? ?Return in about 3 months (around 12/27/2021).  ? ? ?Iran Planas, PA-C ? ?

## 2021-09-27 ENCOUNTER — Encounter: Payer: Self-pay | Admitting: Physician Assistant

## 2021-10-08 ENCOUNTER — Other Ambulatory Visit: Payer: Self-pay | Admitting: Physician Assistant

## 2021-10-08 DIAGNOSIS — M450 Ankylosing spondylitis of multiple sites in spine: Secondary | ICD-10-CM

## 2021-10-25 ENCOUNTER — Telehealth: Payer: Self-pay | Admitting: Neurology

## 2021-10-25 NOTE — Telephone Encounter (Signed)
Called patient back, LVM for him letting him know would need an appointment, that Luvenia Starch is not here and would recommend urgent care evaluation. Told him to call back with any further questions.

## 2021-10-25 NOTE — Telephone Encounter (Signed)
Patient left vm asking for RX for antibiotic. Had a blister on foot and now thinks this is infected. [phone].

## 2021-11-20 ENCOUNTER — Other Ambulatory Visit: Payer: Self-pay | Admitting: Physician Assistant

## 2021-11-20 DIAGNOSIS — E1129 Type 2 diabetes mellitus with other diabetic kidney complication: Secondary | ICD-10-CM

## 2021-11-20 DIAGNOSIS — G250 Essential tremor: Secondary | ICD-10-CM

## 2021-11-20 DIAGNOSIS — I10 Essential (primary) hypertension: Secondary | ICD-10-CM

## 2021-11-20 DIAGNOSIS — R600 Localized edema: Secondary | ICD-10-CM

## 2021-11-20 DIAGNOSIS — R809 Proteinuria, unspecified: Secondary | ICD-10-CM

## 2021-12-02 ENCOUNTER — Other Ambulatory Visit: Payer: Self-pay | Admitting: Physician Assistant

## 2021-12-02 DIAGNOSIS — E7800 Pure hypercholesterolemia, unspecified: Secondary | ICD-10-CM

## 2021-12-02 DIAGNOSIS — E785 Hyperlipidemia, unspecified: Secondary | ICD-10-CM

## 2021-12-26 ENCOUNTER — Other Ambulatory Visit: Payer: Self-pay | Admitting: Physician Assistant

## 2021-12-26 DIAGNOSIS — M545 Low back pain, unspecified: Secondary | ICD-10-CM

## 2021-12-26 DIAGNOSIS — E1342 Other specified diabetes mellitus with diabetic polyneuropathy: Secondary | ICD-10-CM

## 2021-12-26 DIAGNOSIS — M450 Ankylosing spondylitis of multiple sites in spine: Secondary | ICD-10-CM

## 2021-12-26 NOTE — Telephone Encounter (Signed)
Last written 06/01/2021 #180 with 1 refill Last appt 09/26/2021

## 2022-01-03 ENCOUNTER — Ambulatory Visit: Payer: Commercial Managed Care - PPO | Admitting: Physician Assistant

## 2022-01-03 DIAGNOSIS — R809 Proteinuria, unspecified: Secondary | ICD-10-CM

## 2022-01-17 ENCOUNTER — Encounter: Payer: Self-pay | Admitting: Physician Assistant

## 2022-01-17 ENCOUNTER — Telehealth (INDEPENDENT_AMBULATORY_CARE_PROVIDER_SITE_OTHER): Payer: Commercial Managed Care - PPO | Admitting: Physician Assistant

## 2022-01-17 VITALS — BP 125/70 | Ht 71.0 in | Wt 225.0 lb

## 2022-01-17 DIAGNOSIS — E1129 Type 2 diabetes mellitus with other diabetic kidney complication: Secondary | ICD-10-CM | POA: Diagnosis not present

## 2022-01-17 DIAGNOSIS — I10 Essential (primary) hypertension: Secondary | ICD-10-CM | POA: Diagnosis not present

## 2022-01-17 DIAGNOSIS — M450 Ankylosing spondylitis of multiple sites in spine: Secondary | ICD-10-CM

## 2022-01-17 DIAGNOSIS — R809 Proteinuria, unspecified: Secondary | ICD-10-CM

## 2022-01-17 DIAGNOSIS — E785 Hyperlipidemia, unspecified: Secondary | ICD-10-CM | POA: Diagnosis not present

## 2022-01-17 DIAGNOSIS — Z794 Long term (current) use of insulin: Secondary | ICD-10-CM

## 2022-01-17 MED ORDER — OLMESARTAN MEDOXOMIL 20 MG PO TABS: 20.0000 mg | ORAL_TABLET | Freq: Every day | ORAL | 1 refills | Status: DC

## 2022-01-17 MED ORDER — IMIPRAMINE HCL 50 MG PO TABS: 200.0000 mg | ORAL_TABLET | Freq: Every day | ORAL | 1 refills | Status: DC

## 2022-01-17 MED ORDER — SEMAGLUTIDE (2 MG/DOSE) 8 MG/3ML ~~LOC~~ SOPN: 2.0000 mg | PEN_INJECTOR | SUBCUTANEOUS | 0 refills | Status: DC

## 2022-01-17 NOTE — Progress Notes (Signed)
..Virtual Visit via Telephone Note  I connected with Robert Frey on 01/17/22 at  9:10 AM EDT by telephone and verified that I am speaking with the correct person using two identifiers.  Location: Patient: work Provider: clinic  .Marland KitchenParticipating in visit:  Patient: Robert Frey Provider: Iran Planas PA-C   I discussed the limitations, risks, security and privacy concerns of performing an evaluation and management service by telephone and the availability of in person appointments. I also discussed with the patient that there may be a patient responsible charge related to this service. The patient expressed understanding and agreed to proceed.   History of Present Illness: Pt is a 61 yo obese male with HTN, T2DM, HLD who needs refills.   He is checking his sugars and they have been better since starting '2mg'$  weekly ozempic with xigduo. No side effects. Most of the time average sugar is 120. He continues to do toujeo at bedtime 16 units. Denies any hypoglycemic events. No open sores or wounds. No CP, palpitations, headaches or vision changes.   He continues to have chronic low back pain.  .. Active Ambulatory Problems    Diagnosis Date Noted   HYPERCHOLESTEROLEMIA 03/06/2006   OBESITY, NOS 03/06/2006   OBSESSIVE COMPUL. DISORDER 03/06/2006   Ankylosing spondylitis of multiple sites in spine ([place]) 09/30/2009   Psoriatic arthritis ([place]) 10/11/2012   Type 2 diabetes mellitus with microalbuminuria, with long-term current use of insulin ([place]) 10/11/2012   Heel spur 01/20/2014   Osteoarthritis of foot, left 01/20/2014   Lung nodule < 6cm on CT 05/15/2014   Essential hypertension 06/08/2015   Microalbuminuria 06/08/2015   Bruising 06/09/2015   Lumbosacral strain 01/17/2017   Dark urine 01/01/2019   Shakes 04/04/2019   Essential tremor 04/16/2019   Shoulder separation, right, subsequent encounter 06/17/2019   Primary osteoarthritis, right glenohumeral joint 07/02/2019   Current smoker  10/22/2019   Nosebleed 10/22/2019   Diabetic neuropathy ([place]) 01/28/2020   Family history of Alzheimer's disease 11/09/2020   Rib pain on left side 03/07/2021   Fall 03/07/2021   Chronic foot pain, right 05/03/2021   Acute left-sided low back pain without sciatica 06/07/2021   Diabetic foot infection ([place]) 06/28/2021   Psoriasis 06/28/2021   Absent pedal pulses 07/13/2021   Acute right ankle pain 07/13/2021   Family history of blood clots 07/26/2021   Leg edema, right 07/26/2021   Right leg pain 07/26/2021   Syndesmotic disruption of right ankle 08/01/2021   Charcot's joint of right foot 08/03/2021   Resolved Ambulatory Problems    Diagnosis Date Noted   KNEE PAIN, BILATERAL 12/28/2006   ELEVATED BLOOD PRESSURE WITHOUT DIAGNOSIS OF HYPERTENSION 08/29/2006   Cough 11/25/2010   Past Medical History:  Diagnosis Date   Ankylosing spondylitis ([place])    COPD (chronic obstructive pulmonary disease) ([place])    Diabetes mellitus without complication ([name])    High cholesterol    Hypertension       Observations/Objective: No acute distress Normal mood  Normal breathing  .Marland Kitchen Today's Vitals   01/17/22 0845  BP: 125/70  Weight: 225 lb (102.1 kg)  Height: '5\' 11"'$  (1.803 m)   Body mass index is 31.38 kg/m.    Assessment and Plan: Marland KitchenMarland KitchenJamil was seen today for follow-up.  Diagnoses and all orders for this visit:  Type 2 diabetes mellitus with microalbuminuria, with long-term current use of insulin ([place]) -     Hemoglobin A1c -     Semaglutide, 2 MG/DOSE, 8 MG/3ML SOPN; Inject 2  mg as directed once a week. -     Lipid Panel w/reflex Direct LDL -     Microalbumin, urine -     COMPLETE METABOLIC PANEL WITH GFR  Essential hypertension -     olmesartan (BENICAR) 20 MG tablet; Take 1 tablet (20 mg total) by mouth daily. 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  Dyslipidemia, goal LDL below 70 -     Lipid Panel w/reflex Direct LDL   Needs A1C and microalbumin checked with fasting labs Pt will come to the lab Refilled medications BP to goal On statin Eye exam UTD Hx of microalbuminuria needs recheck Foot exam UTD Needs flu shot Follow up in 3 months   Follow Up Instructions:    I discussed the assessment and treatment plan with the patient. The patient was provided an opportunity to ask questions and all were answered. The patient agreed with the plan and demonstrated an understanding of the instructions.   The patient was advised to call back or seek an in-person evaluation if the symptoms worsen or if the condition fails to improve as anticipated.  I provided 30 minutes of non-face-to-face time during this encounter.   Iran Planas, PA-C

## 2022-01-17 NOTE — Progress Notes (Signed)
Pt prefers a phone call , no changes to medication or fills at this moment

## 2022-02-26 ENCOUNTER — Other Ambulatory Visit: Payer: Self-pay | Admitting: Physician Assistant

## 2022-02-26 DIAGNOSIS — E7800 Pure hypercholesterolemia, unspecified: Secondary | ICD-10-CM

## 2022-02-26 DIAGNOSIS — E785 Hyperlipidemia, unspecified: Secondary | ICD-10-CM

## 2022-03-10 ENCOUNTER — Telehealth: Payer: Self-pay

## 2022-03-10 NOTE — Telephone Encounter (Signed)
Patient left a vm msg requesting a med refill for meloxicam. Rx not listed in active med list.

## 2022-03-12 ENCOUNTER — Other Ambulatory Visit: Payer: Self-pay | Admitting: Physician Assistant

## 2022-03-13 ENCOUNTER — Other Ambulatory Visit: Payer: Self-pay | Admitting: Physician Assistant

## 2022-03-13 DIAGNOSIS — E7800 Pure hypercholesterolemia, unspecified: Secondary | ICD-10-CM

## 2022-03-13 DIAGNOSIS — E785 Hyperlipidemia, unspecified: Secondary | ICD-10-CM

## 2022-03-14 ENCOUNTER — Other Ambulatory Visit: Payer: Self-pay | Admitting: Medical-Surgical

## 2022-03-14 DIAGNOSIS — E1342 Other specified diabetes mellitus with diabetic polyneuropathy: Secondary | ICD-10-CM

## 2022-03-14 DIAGNOSIS — M545 Low back pain, unspecified: Secondary | ICD-10-CM

## 2022-03-14 DIAGNOSIS — M450 Ankylosing spondylitis of multiple sites in spine: Secondary | ICD-10-CM

## 2022-03-14 NOTE — Telephone Encounter (Signed)
12/26/2021 - 90 day supply no refills Last appt 01/17/2022

## 2022-03-14 NOTE — Telephone Encounter (Signed)
Left a detailed vm msg for patient regarding the provider's recommendation. Patient aware to return a call back to the clinic. Direct call back info provided.

## 2022-03-16 ENCOUNTER — Encounter: Payer: Self-pay | Admitting: Physician Assistant

## 2022-04-13 ENCOUNTER — Other Ambulatory Visit: Payer: Self-pay | Admitting: Physician Assistant

## 2022-04-13 DIAGNOSIS — Z794 Long term (current) use of insulin: Secondary | ICD-10-CM

## 2022-04-13 NOTE — Telephone Encounter (Signed)
Will need to transfer to another pharmacy

## 2022-06-01 ENCOUNTER — Other Ambulatory Visit: Payer: Self-pay

## 2022-06-06 ENCOUNTER — Telehealth: Payer: Self-pay | Admitting: Physician Assistant

## 2022-06-06 DIAGNOSIS — E1129 Type 2 diabetes mellitus with other diabetic kidney complication: Secondary | ICD-10-CM

## 2022-06-06 MED ORDER — TOUJEO MAX SOLOSTAR 300 UNIT/ML ~~LOC~~ SOPN: 14.0000 [IU] | PEN_INJECTOR | Freq: Two times a day (BID) | SUBCUTANEOUS | 0 refills | Status: DC

## 2022-06-06 NOTE — Telephone Encounter (Signed)
Pt called asking for another refill of Toujeo. He is scheduled for next Monday 06/12/22

## 2022-06-06 NOTE — Telephone Encounter (Signed)
Agree with above plan. 

## 2022-06-06 NOTE — Telephone Encounter (Signed)
RX sent to pharmacy on file

## 2022-06-12 ENCOUNTER — Encounter: Payer: Self-pay | Admitting: Physician Assistant

## 2022-06-12 ENCOUNTER — Ambulatory Visit (INDEPENDENT_AMBULATORY_CARE_PROVIDER_SITE_OTHER): Payer: Commercial Managed Care - PPO | Admitting: Physician Assistant

## 2022-06-12 VITALS — BP 126/64 | HR 118 | Ht 71.0 in | Wt 238.1 lb

## 2022-06-12 DIAGNOSIS — E1342 Other specified diabetes mellitus with diabetic polyneuropathy: Secondary | ICD-10-CM

## 2022-06-12 DIAGNOSIS — Z1329 Encounter for screening for other suspected endocrine disorder: Secondary | ICD-10-CM | POA: Diagnosis not present

## 2022-06-12 DIAGNOSIS — Z794 Long term (current) use of insulin: Secondary | ICD-10-CM

## 2022-06-12 DIAGNOSIS — M450 Ankylosing spondylitis of multiple sites in spine: Secondary | ICD-10-CM

## 2022-06-12 DIAGNOSIS — Z79899 Other long term (current) drug therapy: Secondary | ICD-10-CM

## 2022-06-12 DIAGNOSIS — Z125 Encounter for screening for malignant neoplasm of prostate: Secondary | ICD-10-CM | POA: Diagnosis not present

## 2022-06-12 DIAGNOSIS — Z1211 Encounter for screening for malignant neoplasm of colon: Secondary | ICD-10-CM

## 2022-06-12 DIAGNOSIS — E1129 Type 2 diabetes mellitus with other diabetic kidney complication: Secondary | ICD-10-CM | POA: Diagnosis not present

## 2022-06-12 DIAGNOSIS — R809 Proteinuria, unspecified: Secondary | ICD-10-CM

## 2022-06-12 DIAGNOSIS — G250 Essential tremor: Secondary | ICD-10-CM

## 2022-06-12 DIAGNOSIS — M545 Low back pain, unspecified: Secondary | ICD-10-CM

## 2022-06-12 DIAGNOSIS — E785 Hyperlipidemia, unspecified: Secondary | ICD-10-CM

## 2022-06-12 DIAGNOSIS — I10 Essential (primary) hypertension: Secondary | ICD-10-CM

## 2022-06-12 DIAGNOSIS — E7800 Pure hypercholesterolemia, unspecified: Secondary | ICD-10-CM

## 2022-06-12 LAB — POCT GLYCOSYLATED HEMOGLOBIN (HGB A1C): Hemoglobin A1C: 8.2 % — AB (ref 4.0–5.6)

## 2022-06-12 LAB — HM DIABETES EYE EXAM

## 2022-06-12 MED ORDER — XIGDUO XR 10-1000 MG PO TB24: 1.0000 | ORAL_TABLET | Freq: Every day | ORAL | 3 refills | Status: DC

## 2022-06-12 MED ORDER — OLMESARTAN MEDOXOMIL 20 MG PO TABS: 20.0000 mg | ORAL_TABLET | Freq: Every day | ORAL | 1 refills | Status: DC

## 2022-06-12 MED ORDER — TOUJEO MAX SOLOSTAR 300 UNIT/ML ~~LOC~~ SOPN: 18.0000 [IU] | PEN_INJECTOR | Freq: Two times a day (BID) | SUBCUTANEOUS | 0 refills | Status: DC

## 2022-06-12 MED ORDER — IMIPRAMINE HCL 50 MG PO TABS: 200.0000 mg | ORAL_TABLET | Freq: Every day | ORAL | 1 refills | Status: DC

## 2022-06-12 MED ORDER — ATORVASTATIN CALCIUM 80 MG PO TABS: 80.0000 mg | ORAL_TABLET | Freq: Every day | ORAL | 3 refills | Status: DC

## 2022-06-12 MED ORDER — PRIMIDONE 50 MG PO TABS: 50.0000 mg | ORAL_TABLET | Freq: Every day | ORAL | 1 refills | Status: DC

## 2022-06-12 MED ORDER — PREGABALIN 75 MG PO CAPS: 75.0000 mg | ORAL_CAPSULE | Freq: Two times a day (BID) | ORAL | 3 refills | Status: DC

## 2022-06-12 MED ORDER — TIRZEPATIDE 7.5 MG/0.5ML ~~LOC~~ SOAJ: 7.5000 mg | SUBCUTANEOUS | 0 refills | Status: DC

## 2022-06-12 MED ORDER — HYDROCHLOROTHIAZIDE 25 MG PO TABS: 25.0000 mg | ORAL_TABLET | Freq: Every day | ORAL | 1 refills | Status: DC

## 2022-06-12 MED ORDER — AMLODIPINE BESYLATE 5 MG PO TABS: 5.0000 mg | ORAL_TABLET | Freq: Every day | ORAL | 1 refills | Status: DC

## 2022-06-12 NOTE — Progress Notes (Signed)
Established Patient Office Visit  Subjective   Patient ID: Robert Frey, male    DOB: 1960/06/27  Age: 62 y.o. MRN: [id number]  Chief Complaint  Patient presents with   Diabetes    HPI Pt is a 62 yo obese male with T2DM, HTN, HLD, tremor, psoriasis who presents to the clinic for 3 month follow up.   He is taking toujeo 14 units pm and am. Not checking sugars with fingerstick and refuses to wear a continous glucose monitor. Taking xigduo and ozempic weekly at '2mg'$ . He admits he has not been doing well with diet and exercise. He has complete neuropathy of both feel. No CP, palpitaitons, headaches or vision changes.    .. Active Ambulatory Problems    Diagnosis Date Noted   HYPERCHOLESTEROLEMIA 03/06/2006   OBESITY, NOS 03/06/2006   OBSESSIVE COMPUL. DISORDER 03/06/2006   Ankylosing spondylitis of multiple sites in spine ([place]) 09/30/2009   Psoriatic arthritis ([name]) 10/11/2012   Type 2 diabetes mellitus with microalbuminuria, with long-term current use of insulin ([name]) 10/11/2012   Heel spur 01/20/2014   Osteoarthritis of foot, left 01/20/2014   Lung nodule < 6cm on CT 05/15/2014   Essential hypertension 06/08/2015   Microalbuminuria 06/08/2015   Bruising 06/09/2015   Lumbosacral strain 01/17/2017   Dark urine 01/01/2019   Shakes 04/04/2019   Essential tremor 04/16/2019   Shoulder separation, right, subsequent encounter 06/17/2019   Primary osteoarthritis, right glenohumeral joint 07/02/2019   Current smoker 10/22/2019   Nosebleed 10/22/2019   Diabetic neuropathy ([place]) 01/28/2020   Family history of Alzheimer's disease 11/09/2020   Rib pain on left side 03/07/2021   Fall 03/07/2021   Chronic foot pain, right 05/03/2021   Acute left-sided low back pain without sciatica 06/07/2021   Diabetic foot infection ([place]) 06/28/2021   Psoriasis 06/28/2021   Absent pedal pulses 07/13/2021   Acute right ankle pain 07/13/2021   Family history of blood clots 07/26/2021   Leg edema,  right 07/26/2021   Right leg pain 07/26/2021   Syndesmotic disruption of right ankle 08/01/2021   Charcot's joint of right foot 08/03/2021   Dyslipidemia, goal LDL below 70 01/17/2022   Resolved Ambulatory Problems    Diagnosis Date Noted   KNEE PAIN, BILATERAL 12/28/2006   ELEVATED BLOOD PRESSURE WITHOUT DIAGNOSIS OF HYPERTENSION 08/29/2006   Cough 11/25/2010   Past Medical History:  Diagnosis Date   Ankylosing spondylitis ([name])    COPD (chronic obstructive pulmonary disease) ([place])    Diabetes mellitus without complication ([name])    High cholesterol    Hypertension      ROS   See HPI.  Objective:     BP 126/64 (BP Location: Left Arm, Patient Position: Sitting, Cuff Size: Large)   Pulse (!) 118   Ht '5\' 11"'$  (1.803 m)   Wt 238 lb 1.9 oz (108 kg)   SpO2 96%   BMI 33.21 kg/m  BP Readings from Last 3 Encounters:  06/12/22 126/64  01/17/22 125/70  09/26/21 (!) 124/58   Wt Readings from Last 3 Encounters:  06/12/22 238 lb 1.9 oz (108 kg)  01/17/22 225 lb (102.1 kg)  09/26/21 226 lb (102.5 kg)      Physical Exam Constitutional:      Appearance: Normal appearance.  HENT:     Head: Normocephalic.  Cardiovascular:     Rate and Rhythm: Normal rate and regular rhythm.     Pulses: Normal pulses.  Pulmonary:     Effort: Pulmonary effort is normal.  Breath sounds: Normal breath sounds.  Musculoskeletal:     Right lower leg: No edema.     Left lower leg: No edema.  Neurological:     General: No focal deficit present.     Mental Status: He is alert.  Psychiatric:        Mood and Affect: Mood normal.      Results for orders placed or performed in visit on 06/12/22  POCT glycosylated hemoglobin (Hb A1C)  Result Value Ref Range   Hemoglobin A1C 8.2 (A) 4.0 - 5.6 %   HbA1c POC (<> result, manual entry)     HbA1c, POC (prediabetic range)     HbA1c, POC (controlled diabetic range)     .Marland Kitchen Diabetic Foot Exam - Simple   Simple Foot Form Visual Inspection No  deformities, no ulcerations, no other skin breakdown bilaterally: Yes Sensation Testing See comments: Yes Pulse Check Posterior Tibialis and Dorsalis pulse intact bilaterally: Yes Comments No sensory response to monofilament, bilaterally.       The 10-year ASCVD risk score (Arnett DK, et al., 2019) is: 23.4%    Assessment & Plan:  Marland KitchenMarland KitchenFrenchie was seen today for diabetes.  Diagnoses and all orders for this visit:  Type 2 diabetes mellitus with microalbuminuria, with long-term current use of insulin ([name]) -     COMPLETE METABOLIC PANEL WITH GFR -     POCT UA - Microalbumin -     POCT glycosylated hemoglobin (Hb A1C) -     tirzepatide (MOUNJARO) 7.5 MG/0.5ML Pen; Inject 7.5 mg into the skin once a week. -     Dapagliflozin Pro-metFORMIN ER (XIGDUO XR) 02-999 MG TB24; Take 1 tablet by mouth daily. -     insulin glargine, 2 Unit Dial, (TOUJEO MAX SOLOSTAR) 300 UNIT/ML Solostar Pen; Inject 18 Units into the skin 2 (two) times daily.  Dyslipidemia, goal LDL below 70 -     Lipid Panel w/reflex Direct LDL  Thyroid disorder screen -     TSH  Screening PSA (prostate specific antigen) -     PSA  Medication management -     PSA -     TSH -     Lipid Panel w/reflex Direct LDL -     COMPLETE METABOLIC PANEL WITH GFR -     CBC with Differential/Platelet -     POCT UA - Microalbumin -     POCT glycosylated hemoglobin (Hb A1C)  Colon cancer screening -     Ambulatory referral to Gastroenterology  Essential hypertension -     amLODipine (NORVASC) 5 MG tablet; Take 1 tablet (5 mg total) by mouth daily. -     hydrochlorothiazide (HYDRODIURIL) 25 MG tablet; Take 1 tablet (25 mg total) by mouth daily. -     olmesartan (BENICAR) 20 MG tablet; Take 1 tablet (20 mg total) by mouth daily.  HYPERCHOLESTEROLEMIA -     atorvastatin (LIPITOR) 80 MG tablet; Take 1 tablet (80 mg total) by mouth daily.  Hyperlipidemia LDL goal <70 -     atorvastatin (LIPITOR) 80 MG tablet; Take 1 tablet (80 mg  total) by mouth daily.  Ankylosing spondylitis of multiple sites in spine ([name]) -     imipramine (TOFRANIL) 50 MG tablet; Take 4 tablets (200 mg total) by mouth at bedtime. -     pregabalin (LYRICA) 75 MG capsule; Take 1 capsule (75 mg total) by mouth 2 (two) times daily.  Diabetic polyneuropathy associated with other specified diabetes mellitus ([place]) -  pregabalin (LYRICA) 75 MG capsule; Take 1 capsule (75 mg total) by mouth 2 (two) times daily.  Acute left-sided low back pain without sciatica -     pregabalin (LYRICA) 75 MG capsule; Take 1 capsule (75 mg total) by mouth 2 (two) times daily.  Essential tremor -     primidone (MYSOLINE) 50 MG tablet; Take 1 tablet (50 mg total) by mouth daily.   Needs colonoscopy, referral placed today.  Fasting labs ordered today.   A1C not to goal.  Increase toujeo to 18units at night and in the morning Check sugars in the morning to goal of 90-120.  Increase by 2 units every 5 days until reach goal.  Continue xigduo Stop ozempic and start mounjaro weekly.  BP to goal. Refill medications needed On statin.  Foot and eye exam UTD.  Declined flu/covid/pneumonia vaccine.  Follow up in 3 months.   Lyrica refilled for chronic pain/neuropathy.  Primidone refilled for tremor.      Iran Planas, PA-C

## 2022-06-12 NOTE — Patient Instructions (Addendum)
Increase toujeo to 18 units at night and in the morning Check sugars in morning with goal of 90-120 Increase by 2 units every 5 days until reach goal in the morning fasting  Continue xigduo  Stop ozempic and start moujaro weekly  Will order colonoscopy

## 2022-06-22 ENCOUNTER — Encounter: Payer: Self-pay | Admitting: Physician Assistant

## 2022-08-01 ENCOUNTER — Ambulatory Visit (INDEPENDENT_AMBULATORY_CARE_PROVIDER_SITE_OTHER): Payer: Commercial Managed Care - PPO | Admitting: Behavioral Health

## 2022-08-01 ENCOUNTER — Encounter: Payer: Self-pay | Admitting: Behavioral Health

## 2022-08-01 DIAGNOSIS — F411 Generalized anxiety disorder: Secondary | ICD-10-CM

## 2022-08-01 DIAGNOSIS — F4310 Post-traumatic stress disorder, unspecified: Secondary | ICD-10-CM | POA: Diagnosis not present

## 2022-08-01 NOTE — Progress Notes (Signed)
                Darenda Fike M Krish Bailly, LCMHC 
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
## 2022-08-22 ENCOUNTER — Ambulatory Visit (INDEPENDENT_AMBULATORY_CARE_PROVIDER_SITE_OTHER): Payer: Commercial Managed Care - PPO | Admitting: Behavioral Health

## 2022-08-22 ENCOUNTER — Encounter: Payer: Self-pay | Admitting: Behavioral Health

## 2022-08-22 DIAGNOSIS — F411 Generalized anxiety disorder: Secondary | ICD-10-CM | POA: Diagnosis not present

## 2022-08-22 DIAGNOSIS — F4310 Post-traumatic stress disorder, unspecified: Secondary | ICD-10-CM | POA: Diagnosis not present
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
## 2022-08-22 NOTE — Progress Notes (Signed)
                Bibiana Gillean M Syris Brookens, LCMHC 

## 2022-09-05 ENCOUNTER — Ambulatory Visit (INDEPENDENT_AMBULATORY_CARE_PROVIDER_SITE_OTHER): Payer: Commercial Managed Care - PPO | Admitting: Behavioral Health

## 2022-09-05 ENCOUNTER — Encounter: Payer: Self-pay | Admitting: Behavioral Health

## 2022-09-05 DIAGNOSIS — F4310 Post-traumatic stress disorder, unspecified: Secondary | ICD-10-CM | POA: Diagnosis not present
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
## 2022-09-11 ENCOUNTER — Ambulatory Visit (INDEPENDENT_AMBULATORY_CARE_PROVIDER_SITE_OTHER): Payer: Commercial Managed Care - PPO | Admitting: Physician Assistant

## 2022-09-11 VITALS — BP 152/71 | HR 74 | Ht 71.0 in | Wt 232.0 lb

## 2022-09-11 DIAGNOSIS — E1129 Type 2 diabetes mellitus with other diabetic kidney complication: Secondary | ICD-10-CM | POA: Diagnosis not present

## 2022-09-11 DIAGNOSIS — I10 Essential (primary) hypertension: Secondary | ICD-10-CM | POA: Diagnosis not present

## 2022-09-11 DIAGNOSIS — Z794 Long term (current) use of insulin: Secondary | ICD-10-CM | POA: Diagnosis not present

## 2022-09-11 DIAGNOSIS — E785 Hyperlipidemia, unspecified: Secondary | ICD-10-CM

## 2022-09-11 DIAGNOSIS — R809 Proteinuria, unspecified: Secondary | ICD-10-CM | POA: Diagnosis not present

## 2022-09-11 LAB — POCT GLYCOSYLATED HEMOGLOBIN (HGB A1C): Hemoglobin A1C: 7.1 % — AB (ref 4.0–5.6)

## 2022-09-11 LAB — POCT UA - MICROALBUMIN
Creatinine, POC: 50 mg/dL
Microalbumin Ur, POC: 80 mg/L

## 2022-09-11 MED ORDER — BLOOD GLUCOSE MONITORING SUPPL DEVI
1.0000 | Freq: Every day | 0 refills | Status: AC
Start: 2022-09-11 — End: ?

## 2022-09-11 MED ORDER — TOUJEO MAX SOLOSTAR 300 UNIT/ML ~~LOC~~ SOPN: 18.0000 [IU] | PEN_INJECTOR | Freq: Two times a day (BID) | SUBCUTANEOUS | 0 refills | Status: DC

## 2022-09-11 MED ORDER — LANCET DEVICE MISC
1.0000 | Freq: Three times a day (TID) | 0 refills | Status: AC
Start: 2022-09-11 — End: 2022-10-11

## 2022-09-11 MED ORDER — BLOOD GLUCOSE TEST VI STRP
1.0000 | ORAL_STRIP | Freq: Three times a day (TID) | 0 refills | Status: DC
Start: 2022-09-11 — End: 2022-10-24

## 2022-09-11 MED ORDER — LANCETS MISC. MISC
1.0000 | Freq: Three times a day (TID) | 0 refills | Status: AC
Start: 2022-09-11 — End: 2022-10-11

## 2022-09-11 MED ORDER — AMLODIPINE BESYLATE 5 MG PO TABS: 5.0000 mg | ORAL_TABLET | Freq: Every day | ORAL | 1 refills | Status: DC

## 2022-09-11 MED ORDER — TIRZEPATIDE 10 MG/0.5ML ~~LOC~~ SOAJ
10.0000 mg | SUBCUTANEOUS | 0 refills | Status: DC
Start: 2022-09-11 — End: 2023-04-17

## 2022-09-11 NOTE — Patient Instructions (Signed)
Mounjaro increased to 10mg weekly

## 2022-09-11 NOTE — Progress Notes (Signed)
Established Patient Office Visit  Subjective   Patient ID: Robert Frey, male    DOB: 09-07-1960  Age: 62 y.o. MRN: [id number]  Chief Complaint  Patient presents with   Follow-up    HPI Pt is a 62 yo obese male with T2DM with microalbuminuria, peripheral neuropathy, HLD, HTN, MDD, GAD who presents to the clinic for follow up.   Pt is checking his sugars some but his meter broke recently. Most of sugars have been under 150s. No CP, palpitations, headaches or vision changes. No hypoglycemic events. He is in a right leg brace due to ankle fracture.   He sees counseling for mood and doing better. No concerns.     Active Ambulatory Problems    Diagnosis Date Noted   HYPERCHOLESTEROLEMIA 03/06/2006   OBESITY, NOS 03/06/2006   OBSESSIVE COMPUL. DISORDER 03/06/2006   Ankylosing spondylitis of multiple sites in spine 09/30/2009   Psoriatic arthritis ([name]) 10/11/2012   Type 2 diabetes mellitus with microalbuminuria, with long-term current use of insulin 10/11/2012   Heel spur 01/20/2014   Osteoarthritis of foot, left 01/20/2014   Lung nodule < 6cm on CT 05/15/2014   Essential hypertension 06/08/2015   Microalbuminuria 06/08/2015   Bruising 06/09/2015   Lumbosacral strain 01/17/2017   Dark urine 01/01/2019   Shakes 04/04/2019   Essential tremor 04/16/2019   Shoulder separation, right, subsequent encounter 06/17/2019   Primary osteoarthritis, right glenohumeral joint 07/02/2019   Current smoker 10/22/2019   Nosebleed 10/22/2019   Diabetic neuropathy 01/28/2020   Family history of Alzheimer's disease 11/09/2020   Rib pain on left side 03/07/2021   Fall 03/07/2021   Chronic foot pain, right 05/03/2021   Acute left-sided low back pain without sciatica 06/07/2021   Diabetic foot infection 06/28/2021   Psoriasis 06/28/2021   Absent pedal pulses 07/13/2021   Acute right ankle pain 07/13/2021   Family history of blood clots 07/26/2021   Leg edema, right 07/26/2021   Right leg  pain 07/26/2021   Syndesmotic disruption of right ankle 08/01/2021   Charcot's joint of right foot 08/03/2021   Dyslipidemia, goal LDL below 70 01/17/2022   Resolved Ambulatory Problems    Diagnosis Date Noted   KNEE PAIN, BILATERAL 12/28/2006   ELEVATED BLOOD PRESSURE WITHOUT DIAGNOSIS OF HYPERTENSION 08/29/2006   Cough 11/25/2010   Past Medical History:  Diagnosis Date   Ankylosing spondylitis    COPD (chronic obstructive pulmonary disease)    Diabetes mellitus without complication    High cholesterol    Hypertension      ROS   See HPI.  Objective:     BP (!) 152/71   Pulse 74   Ht 5\' 11"  (1.803 m)   Wt 232 lb (105.2 kg)   SpO2 99%   BMI 32.36 kg/m  BP Readings from Last 3 Encounters:  09/11/22 (!) 152/71  06/12/22 126/64  01/17/22 125/70   Wt Readings from Last 3 Encounters:  09/11/22 232 lb (105.2 kg)  06/12/22 238 lb 1.9 oz (108 kg)  01/17/22 225 lb (102.1 kg)    ..    06/12/2022    2:18 PM 09/26/2021    9:07 AM 07/16/2019    7:48 AM 04/16/2019    7:42 AM 04/02/2019    7:13 AM  Depression screen PHQ 2/9  Decreased Interest 0 0 0 0 0  Down, Depressed, Hopeless 0 0 0 0 0  PHQ - 2 Score 0 0 0 0 0  Altered sleeping  0 0 0 0  Tired, decreased energy  0 0 0 0  Change in appetite  0 0 0 0  Feeling bad or failure about yourself   0 0 0 0  Trouble concentrating  0 0 0 0  Moving slowly or fidgety/restless  0 0 0 0  Suicidal thoughts  0 0 0 0  PHQ-9 Score  0 0 0 0  Difficult doing work/chores  Not difficult at all Not difficult at all Not difficult at all Not difficult at all   ..    09/26/2021    9:07 AM 07/16/2019    7:48 AM 04/16/2019    7:42 AM 04/02/2019    7:13 AM  GAD 7 : Generalized Anxiety Score  Nervous, Anxious, on Edge 0 0 0 0  Control/stop worrying 0 0 0 0  Worry too much - different things 0 0 0 0  Trouble relaxing 0 0 0 0  Restless 0 0 0 0  Easily annoyed or irritable 0 0 0 0  Afraid - awful might happen 0 0 0 0  Total GAD 7 Score 0 0 0  0  Anxiety Difficulty Not difficult at all Not difficult at all Not difficult at all Not difficult at all      Physical Exam Vitals reviewed.  Constitutional:      Appearance: Normal appearance. He is obese.  HENT:     Head: Normocephalic.     Mouth/Throat:     Mouth: Mucous membranes are moist.  Eyes:     Conjunctiva/sclera: Conjunctivae normal.  Cardiovascular:     Rate and Rhythm: Normal rate and regular rhythm.     Heart sounds: Normal heart sounds.  Pulmonary:     Effort: Pulmonary effort is normal.     Breath sounds: Normal breath sounds.  Musculoskeletal:     Comments: Right ankle brace  Neurological:     General: No focal deficit present.     Mental Status: He is alert.  Psychiatric:        Mood and Affect: Mood normal.      Results for orders placed or performed in visit on 09/11/22  POCT HgB A1C  Result Value Ref Range   Hemoglobin A1C 7.1 (A) 4.0 - 5.6 %   HbA1c POC (<> result, manual entry)     HbA1c, POC (prediabetic range)     HbA1c, POC (controlled diabetic range)    POCT UA - Microalbumin  Result Value Ref Range   Microalbumin Ur, POC 80 mg/L   Creatinine, POC 50 mg/dL   Albumin/Creatinine Ratio, Urine, POC 30-300      The 10-year ASCVD risk score (Arnett DK, et al., 2019) is: 31.2%    Assessment & Plan:  Marland KitchenMarland KitchenDresden was seen today for follow-up.  Diagnoses and all orders for this visit:  Type 2 diabetes mellitus with microalbuminuria, with long-term current use of insulin -     POCT HgB A1C -     POCT UA - Microalbumin -     Comprehensive Metabolic Panel (CMET) -     insulin glargine, 2 Unit Dial, (TOUJEO MAX SOLOSTAR) 300 UNIT/ML Solostar Pen; Inject 18 Units into the skin 2 (two) times daily. -     Blood Glucose Monitoring Suppl DEVI; 1 each by Does not apply route daily. May substitute to any manufacturer covered by patient's insurance. -     Glucose Blood (BLOOD GLUCOSE TEST STRIPS) STRP; 1 each by In Vitro route in the morning, at noon,  and at bedtime. May  substitute to any manufacturer covered by patient's insurance. -     Lancet Device MISC; 1 each by Does not apply route in the morning, at noon, and at bedtime. May substitute to any manufacturer covered by patient's insurance. -     Lancets Misc. MISC; 1 each by Does not apply route in the morning, at noon, and at bedtime. May substitute to any manufacturer covered by patient's insurance. -     tirzepatide (MOUNJARO) 10 MG/0.5ML Pen; Inject 10 mg into the skin once a week.  Essential hypertension -     Comprehensive Metabolic Panel (CMET) -     amLODipine (NORVASC) 5 MG tablet; Take 1 tablet (5 mg total) by mouth daily. -     Lancet Device MISC; 1 each by Does not apply route in the morning, at noon, and at bedtime. May substitute to any manufacturer covered by patient's insurance.  Microalbuminuria -     Lancet Device MISC; 1 each by Does not apply route in the morning, at noon, and at bedtime. May substitute to any manufacturer covered by patient's insurance.  Dyslipidemia, goal LDL below 70 -     Lancet Device MISC; 1 each by Does not apply route in the morning, at noon, and at bedtime. May substitute to any manufacturer covered by patient's insurance.  Need for colonoscopy. Per patient not been called. Printed referral with number for him to call.   A1C improving but not to goal Increased mounjaro to  Continue all other medications Refilled glucose meter/lancets/device BP not to goal, on ARB increased norvasc to  daily Recheck in 3 months Microalbumin abnormal on ACE and SGLT-2, cmp ordered Foot and eye exam UTD Vaccines UTD Follow up in 3 months.     Tandy Gaw, PA-C

## 2022-09-13 ENCOUNTER — Encounter: Payer: Self-pay | Admitting: Physician Assistant

## 2022-09-13 ENCOUNTER — Other Ambulatory Visit: Payer: Self-pay | Admitting: Physician Assistant

## 2022-09-13 DIAGNOSIS — R809 Proteinuria, unspecified: Secondary | ICD-10-CM

## 2022-09-19 ENCOUNTER — Ambulatory Visit (INDEPENDENT_AMBULATORY_CARE_PROVIDER_SITE_OTHER): Payer: Commercial Managed Care - PPO | Admitting: Behavioral Health

## 2022-09-19 ENCOUNTER — Encounter: Payer: Self-pay | Admitting: Behavioral Health

## 2022-09-19 DIAGNOSIS — F4310 Post-traumatic stress disorder, unspecified: Secondary | ICD-10-CM | POA: Diagnosis not present

## 2022-09-19 NOTE — Progress Notes (Signed)
[facility] Counselor/Therapist Progress Note  Patient ID: Robert Frey, MRN: [id number],    Date: 09/19/2022  Time Spent: 57 minutes spent in person with the patient.    Treatment Type: Individual Therapy  Reported Symptoms: Generalized anxiety disorder/stress, patient having repeated weird and/or bad dreams related to his past.  Mental Status Exam: Appearance:  Fairly Groomed     Behavior: Appropriate  Motor: Normal  Speech/Language:  Clear and Coherent  Affect: Appropriate  Mood: sad  Thought process: normal  Thought content:   WNL  Sensory/Perceptual disturbances:   WNL  Orientation: oriented to person, place, time/date, situation, day of week, and month of year  Attention: Good  Concentration: Good  Memory: WNL  Fund of knowledge:  Good  Insight:   Good  Judgment:  Good  Impulse Control: Good   Risk Assessment: Danger to Self:  No Self-injurious Behavior: No Danger to Others: No Duty to Warn:no Physical Aggression / Violence:No  Access to Firearms a concern: No  Gang Involvement:No   Subjective: The patient reports that he and his wife had an argument over the weekend.  He said his wife got upset with him because she said that he was slurring his words and said she had addressed this with him before.  He works first shift and she works second so they rarely see each other.  Even on weekends she is very busy so they do not have much time together.  He reports that she told him he is an alcoholic and that he needs to get help immediately and that she is done with his drinking.  He reports that he drinks to two "tall boys" of [employer] 3-4 nights per week.  Alcohol content is about 80% and he chews 24 ounces.  He does report a history of when he was in his 29s drinking heavily but says that is the most that he drinks now.  He reports that he does not have any other symptoms other than occasional slurred speech.  He said he never drives when he drinks.  He  only drinks when his wife is at work or son is not there.  He says that he drinks to sleep because it helps him get to sleep.  He reports that he is medication compliant even if he drinks but he rarely goes beyond the above listed amount.  He reports no other side effects.  He does not have any alcohol.  He does not drink when he is driving.  He does not report any other symptoms such as hangovers and blackouts poor decisions.  There is no legal history associated with drinking alcohol.  He reports no other drug use at all which his wife questioned him about.  He said now he is questioning whether he is an alcoholic although he does not think that he is.  He googled search to treatment facilities over the weekend and organization out of [place] called him back and did an assessment on him over the phone.  His wife said he met criteria as to the counselor but that patient said he did not feel it was appropriate at all.  They wanted him to go to [place] immediately for treatment but he does not feel the need to do that.  He is aware that I am not a substance abuse therapist.  His wife said that he needed to do what ever I recommended but ask him to give me some time to look into this.  The initial thought would be to look at possible outpatient therapy with someone who is more qualified and trained in that area.  He does say he has a lot of beer cans in his garage which I encouraged him to clean up as a sign of positive action.  He says he does not want to drink now because he wants his marriage to work because they have been married 35 years.  He does report there are other contributing factors.  There were jobs in different shifts make it tough for them to spend time together.  He sleeps on the couch because of his ankle and because of his snoring.  He says that over the years they have collected antiques and even though he does not describe it as hoarding he said herself everywhere in the house constantly feels  cluttered.  He is not opposed to having stayed or auction sale to get rid of a lot of it.  They are trying to move into another house to be in renovating and all the clutter makes it hard to do.  I encouraged him to take steps in that direction so he did not have to worry about 2 houses.  I ask if he and his wife will be opposed to marital therapy and he said nobody would need to check with her.  He said they rarely talk because they really have time together.  She works second health in the behavioral health position so he knows her job is extremely stressful.  He also had a weird dream in the last few nights in which he walked up to a lake which was surrounded by a wall.  He had an opening when he walked up to the opening he could see a Emergency planning/management officer dead in the lake.  We walked up to the like to police officer spoke to him saying you have got to help me.  He said that scared him to the point of waking up.  Most of his dreams involve somebody being dead.  He knows that they are dead and they are not necessarily frightening looking but sometimes the dreams are.  Most of the dreams he feels overwhelmed.  In that dream, his wife told him that it must mean he is drowning.  We did look at the possibility of him being overwhelmed with his house with other things contributing to that.  He has given me permission to speak to his primary care physician and will reach out to see if there are other treatment options for at least outpatient substance abuse therapy. The patient does contract for safety having no thoughts of hurting himself or anyone else.  Interventions: Cognitive Behavioral Therapy and Dialectical Behavioral Therapy  Diagnosis:PTSD  Plan: I will meet with the patient every 2 to 3 weeks in person. Treatment plan: We will use cognitive behavioral therapy principles as well as elements of dialectical behavior therapy to reduce depression and anxiety at least 50% over the next 6 months.  Target date  will be March 29, 2023.  Goals for depression are to have less sadness as indicated by patient report and PHQ-9 score, to have improved mood and return to a healthier level of functioning and to identify historical or current causes for depressed mood and learn how to cope with the depression.  Interventions will be to use CBT to explore and replace unhealthy thoughts and behavior patterns and encouraged sharing of feelings related to causes of symptoms of depression including dreams  and history, teach and encouraged the use of coping skills for managing depressive symptoms.  Goals for reducing anxiety/stress are to improve his ability to manage anxiety symptoms and better handle stress, identify causes for anxiety and stress to manage thoughts and worrisome thinking contributing to feelings of anxiety.  We will facilitate solution skills for helping identify stressors, teach coping skills for reducing anxiety and stress and use cognitive behavioral therapy to identify and change anxiety producing thoughts and behavior patterns.  Lastly we will use DBT distress tolerance and mindfulness skills [name], [facility]                 [name], [facility]               [name], [facility]

## 2022-09-24 ENCOUNTER — Other Ambulatory Visit: Payer: Self-pay | Admitting: Physician Assistant

## 2022-09-24 DIAGNOSIS — M450 Ankylosing spondylitis of multiple sites in spine: Secondary | ICD-10-CM

## 2022-09-29 LAB — HM COLONOSCOPY

## 2022-10-03 ENCOUNTER — Ambulatory Visit (INDEPENDENT_AMBULATORY_CARE_PROVIDER_SITE_OTHER): Payer: Commercial Managed Care - PPO | Admitting: Behavioral Health

## 2022-10-03 ENCOUNTER — Encounter: Payer: Self-pay | Admitting: Behavioral Health

## 2022-10-03 DIAGNOSIS — F4310 Post-traumatic stress disorder, unspecified: Secondary | ICD-10-CM | POA: Diagnosis not present

## 2022-10-03 NOTE — Progress Notes (Signed)
[facility] Counselor/Therapist Progress Note  Patient ID: Robert Frey, MRN: [id number],    Date: 10/03/2022  Time Spent: 57 minutes spent in person with the patient.    Treatment Type: Individual Therapy  Reported Symptoms: Generalized anxiety disorder/stress, patient having repeated weird and/or bad dreams related to his past.  Mental Status Exam: Appearance:  Fairly Groomed     Behavior: Appropriate  Motor: Normal  Speech/Language:  Clear and Coherent  Affect: Appropriate  Mood: sad  Thought process: normal  Thought content:   WNL  Sensory/Perceptual disturbances:   WNL  Orientation: oriented to person, place, time/date, situation, day of week, and month of year  Attention: Good  Concentration: Good  Memory: WNL  Fund of knowledge:  Good  Insight:   Good  Judgment:  Good  Impulse Control: Good   Risk Assessment: Danger to Self:  No Self-injurious Behavior: No Danger to Others: No Duty to Warn:no Physical Aggression / Violence:No  Access to Firearms a concern: No  Gang Involvement:No   Subjective: The patient reports that he has not had any alcohol in 1 month.  He says he has not had any cravings and does not want to drink again.  He is frustrated because his wife still feels he is an alcoholic.  I encouraged him to reach out to the substance abuse therapist I referred him to for evaluation even if he feels that he is not.  He also reports frustration in terms of their communication.  He feels that he is not doing enough to make his wife happy and does not always know what that is.  He recognizes that part of the problem is that they work different shifts and there is little in terms of communication.  I encouraged intentional time together and to look for things that he could do to help out around the house as he is physically able.  Also encouraged him to start with something small to the clutter with her home talked about the wide spread effect that can  have on feeling better and in better emotional health.  He continues to have some dreams which are concerning to him and we started processing some of those at the end of today's session.  The patient does contract for safety having no thoughts of hurting himself or anyone else.  Interventions: Cognitive Behavioral Therapy and Dialectical Behavioral Therapy  Diagnosis:PTSD  Plan: I will meet with the patient every 2 to 3 weeks in person. Treatment plan: We will use cognitive behavioral therapy principles as well as elements of dialectical behavior therapy to reduce depression and anxiety at least 50% over the next 6 months.  Target date will be March 29, 2023.  Goals for depression are to have less sadness as indicated by patient report and PHQ-9 score, to have improved mood and return to a healthier level of functioning and to identify historical or current causes for depressed mood and learn how to cope with the depression.  Interventions will be to use CBT to explore and replace unhealthy thoughts and behavior patterns and encouraged sharing of feelings related to causes of symptoms of depression including dreams and history, teach and encouraged the use of coping skills for managing depressive symptoms.  Goals for reducing anxiety/stress are to improve his ability to manage anxiety symptoms and better handle stress, identify causes for anxiety and stress to manage thoughts and worrisome thinking contributing to feelings of anxiety.  We will facilitate solution skills for helping identify stressors,  teach coping skills for reducing anxiety and stress and use cognitive behavioral therapy to identify and change anxiety producing thoughts and behavior patterns.  Lastly we will use DBT distress tolerance and mindfulness skills [name], [facility]

## 2022-10-17 ENCOUNTER — Ambulatory Visit (INDEPENDENT_AMBULATORY_CARE_PROVIDER_SITE_OTHER): Payer: Commercial Managed Care - PPO | Admitting: Behavioral Health

## 2022-10-17 ENCOUNTER — Encounter: Payer: Self-pay | Admitting: Behavioral Health

## 2022-10-17 DIAGNOSIS — F4310 Post-traumatic stress disorder, unspecified: Secondary | ICD-10-CM
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
## 2022-10-21 ENCOUNTER — Other Ambulatory Visit: Payer: Self-pay | Admitting: Physician Assistant

## 2022-10-21 DIAGNOSIS — E1129 Type 2 diabetes mellitus with other diabetic kidney complication: Secondary | ICD-10-CM

## 2022-10-30 ENCOUNTER — Ambulatory Visit: Payer: Commercial Managed Care - PPO | Admitting: Behavioral Health

## 2022-11-13 ENCOUNTER — Ambulatory Visit: Payer: Commercial Managed Care - PPO | Admitting: Behavioral Health

## 2022-11-14 ENCOUNTER — Encounter: Payer: Self-pay | Admitting: Physician Assistant

## 2022-11-27 ENCOUNTER — Encounter: Payer: Self-pay | Admitting: Behavioral Health

## 2022-11-27 ENCOUNTER — Ambulatory Visit (INDEPENDENT_AMBULATORY_CARE_PROVIDER_SITE_OTHER): Payer: Commercial Managed Care - PPO | Admitting: Behavioral Health

## 2022-11-27 DIAGNOSIS — F411 Generalized anxiety disorder: Secondary | ICD-10-CM | POA: Diagnosis not present

## 2022-11-27 NOTE — Progress Notes (Signed)
[facility] Counselor/Therapist Progress Note  Patient ID: Robert Frey, MRN: [id number],    Date: 11/27/2022  Time Spent: 57 minutes spent in person with the patient.    Treatment Type: Individual Therapy  Reported Symptoms: Generalized anxiety disorder/stress, patient having repeated weird and/or bad dreams related to his past.  Mental Status Exam: Appearance:  Fairly Groomed     Behavior: Appropriate  Motor: Normal  Speech/Language:  Clear and Coherent  Affect: Appropriate  Mood: sad  Thought process: normal  Thought content:   WNL  Sensory/Perceptual disturbances:   WNL  Orientation: oriented to person, place, time/date, situation, day of week, and month of year  Attention: Good  Concentration: Good  Memory: WNL  Fund of knowledge:  Good  Insight:   Good  Judgment:  Good  Impulse Control: Good   Risk Assessment: Danger to Self:  No Self-injurious Behavior: No Danger to Others: No Duty to Warn:no Physical Aggression / Violence:No  Access to Firearms a concern: No  Gang Involvement:No   Subjective: The patient indicated that he has no known 4 months without any alcohol and has no temptation.  He did meet with a substance abuse specialist 3 times and she does not see any pattern of alcoholism.  He continues to feel that he was drinking primarily out of boredom.  For the most part he has been feeling a lot of that time with going fishing which he is enjoying.  We talked about some of the things that might feel his time especially the season change.  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  We continue to look more at  his past.  He feels that unpacking some of this past has helped minimize the number of bad dreams that he was having as well as the intensity continue to talk more about what he had to handle when he was not necessarily mature enough to do so.  He knows that his mom had mental health issues and how much that played into how little she did get done but he did not necessarily understand that as a child.  He learned how to do a lot of things which he is thankful for now but said it was very difficult.  He was working as a child to make money just to put food on the table because his mother could not.  He felt that a lot of ways abandoned because he said that her family did not know about his and his mother situation and did not help.  He did have a pretty good relationship with his grandmother but his grandfather died 3 years after his father died in show he knows he did not have a lot of great male role models.  We will continue to process his past as he is comfortable doing so.  He feels that for the most part he is coping fairly well right now.   The patient does contract for safety having no thoughts of hurting himself or anyone else.  Interventions: Cognitive Behavioral Therapy and Dialectical Behavioral Therapy  Diagnosis:PTSD  Plan: I will meet with the patient  every 2 to 3 weeks in person. Treatment plan: We will use cognitive behavioral therapy principles as well as elements of dialectical behavior therapy to reduce depression and anxiety at least 50% over the next 6 months.  Target date will be March 29, 2023.  Goals for depression are to have less sadness as indicated by patient report and PHQ-9 score, to have improved mood and return to a healthier level of functioning and to identify historical or current causes for depressed mood and learn how to cope with the depression.  Interventions will be to use CBT to explore and replace unhealthy thoughts and behavior patterns and encouraged sharing of  feelings related to causes of symptoms of depression including dreams and history, teach and encouraged the use of coping skills for managing depressive symptoms.  Goals for reducing anxiety/stress are to improve his ability to manage anxiety symptoms and better handle stress, identify causes for anxiety and stress to manage thoughts and worrisome thinking contributing to feelings of anxiety.  We will facilitate solution skills for helping identify stressors, teach coping skills for reducing anxiety and stress and use cognitive behavioral therapy to identify and change anxiety producing thoughts and behavior patterns.  Lastly we will use DBT distress tolerance and mindfulness skills [name], [facility]                                                             [name], [facility]               [name], [facility]

## 2022-11-28 ENCOUNTER — Other Ambulatory Visit: Payer: Self-pay | Admitting: Physician Assistant

## 2022-11-28 ENCOUNTER — Ambulatory Visit
Admission: EM | Admit: 2022-11-28 | Discharge: 2022-11-28 | Disposition: A | Discharge status: Home / Self Care | Payer: Commercial Managed Care - PPO | Attending: Family Medicine | Admitting: Family Medicine

## 2022-11-28 ENCOUNTER — Other Ambulatory Visit: Payer: Self-pay

## 2022-11-28 ENCOUNTER — Emergency Department (HOSPITAL_BASED_OUTPATIENT_CLINIC_OR_DEPARTMENT_OTHER)
Admission: EM | Admit: 2022-11-28 | Discharge: 2022-11-29 | Disposition: A | Discharge status: Home / Self Care | Payer: Commercial Managed Care - PPO | Attending: Emergency Medicine | Admitting: Emergency Medicine

## 2022-11-28 ENCOUNTER — Emergency Department (HOSPITAL_BASED_OUTPATIENT_CLINIC_OR_DEPARTMENT_OTHER): Payer: Commercial Managed Care - PPO

## 2022-11-28 ENCOUNTER — Encounter (HOSPITAL_BASED_OUTPATIENT_CLINIC_OR_DEPARTMENT_OTHER): Payer: Self-pay | Admitting: Urology

## 2022-11-28 DIAGNOSIS — R911 Solitary pulmonary nodule: Secondary | ICD-10-CM | POA: Diagnosis not present

## 2022-11-28 DIAGNOSIS — R112 Nausea with vomiting, unspecified: Secondary | ICD-10-CM

## 2022-11-28 DIAGNOSIS — E860 Dehydration: Secondary | ICD-10-CM

## 2022-11-28 DIAGNOSIS — R030 Elevated blood-pressure reading, without diagnosis of hypertension: Secondary | ICD-10-CM | POA: Diagnosis not present

## 2022-11-28 DIAGNOSIS — R1110 Vomiting, unspecified: Secondary | ICD-10-CM

## 2022-11-28 DIAGNOSIS — E871 Hypo-osmolality and hyponatremia: Secondary | ICD-10-CM

## 2022-11-28 DIAGNOSIS — K8020 Calculus of gallbladder without cholecystitis without obstruction: Secondary | ICD-10-CM | POA: Diagnosis not present

## 2022-11-28 DIAGNOSIS — E1129 Type 2 diabetes mellitus with other diabetic kidney complication: Secondary | ICD-10-CM

## 2022-11-28 DIAGNOSIS — Z91040 Latex allergy status: Secondary | ICD-10-CM | POA: Diagnosis not present

## 2022-11-28 LAB — URINALYSIS, ROUTINE W REFLEX MICROSCOPIC
Bilirubin Urine: NEGATIVE
Glucose, UA: >=500 mg/dL — AB
Hgb urine dipstick: NEGATIVE
Ketones, ur: 15 mg/dL — AB
Leukocytes,Ua: NEGATIVE
Nitrite: NEGATIVE
Protein, ur: NEGATIVE mg/dL
Specific Gravity, Urine: 1.02 (ref 1.005–1.030)
pH: 5.5 (ref 5.0–8.0)

## 2022-11-28 LAB — COMPREHENSIVE METABOLIC PANEL
ALT: 39 U/L (ref 0–44)
AST: 35 U/L (ref 15–41)
Albumin: 4.9 g/dL (ref 3.5–5.0)
Alkaline Phosphatase: 73 U/L (ref 38–126)
Anion gap: 21 — ABNORMAL HIGH (ref 5–15)
BUN: 18 mg/dL (ref 6–20)
CO2: 23 mmol/L (ref 22–32)
Calcium: 10.3 mg/dL (ref 8.9–10.3)
Chloride: 87 mmol/L — ABNORMAL LOW (ref 98–111)
Creatinine, Ser: 1.01 mg/dL (ref 0.61–1.24)
GFR, Estimated: >60 mL/min (ref >60)
Glucose, Bld: 159 mg/dL — ABNORMAL HIGH (ref 70–99)
Potassium: 3.8 mmol/L (ref 3.5–5.1)
Sodium: 131 mmol/L — ABNORMAL LOW (ref 135–145)
Total Bilirubin: 1.1 mg/dL (ref 0.3–1.2)
Total Protein: 9 g/dL — ABNORMAL HIGH (ref 6.5–8.1)

## 2022-11-28 LAB — URINALYSIS, MICROSCOPIC (REFLEX)
RBC / HPF: NONE SEEN RBC/hpf (ref 0–5)
WBC, UA: NONE SEEN WBC/hpf (ref 0–5)

## 2022-11-28 LAB — CBC
HCT: 43 % (ref 39.0–52.0)
Hemoglobin: 15.5 g/dL (ref 13.0–17.0)
MCH: 31.7 pg (ref 26.0–34.0)
MCHC: 36 g/dL (ref 30.0–36.0)
MCV: 87.9 fL (ref 80.0–100.0)
Platelets: 240 10*3/uL (ref 150–400)
RBC: 4.89 MIL/uL (ref 4.22–5.81)
RDW: 14.2 % (ref 11.5–15.5)
WBC: 12.1 10*3/uL — ABNORMAL HIGH (ref 4.0–10.5)
nRBC: 0 % (ref 0.0–0.2)

## 2022-11-28 LAB — CK: Total CK: 111 U/L (ref 49–397)

## 2022-11-28 LAB — LIPASE, BLOOD: Lipase: 29 U/L (ref 11–51)

## 2022-11-28 LAB — CBG MONITORING, ED: Glucose-Capillary: 124 mg/dL — ABNORMAL HIGH (ref 70–99)

## 2022-11-28 LAB — TROPONIN I (HIGH SENSITIVITY): Troponin I (High Sensitivity): 5 ng/L (ref <18)

## 2022-11-28 MED ORDER — ONDANSETRON HCL 4 MG/2ML IJ SOLN
4.0000 mg | Freq: Once | INTRAMUSCULAR | Status: AC
  Administered 2022-11-28: 4 mg via INTRAVENOUS
  Filled 2022-11-28: qty 2

## 2022-11-28 MED ORDER — TOUJEO MAX SOLOSTAR 300 UNIT/ML ~~LOC~~ SOPN
18.0000 [IU] | PEN_INJECTOR | Freq: Two times a day (BID) | SUBCUTANEOUS | 0 refills | Status: DC
Start: 2022-11-28 — End: 2023-04-17

## 2022-11-28 MED ORDER — ONDANSETRON 8 MG PO TBDP
8.0000 mg | ORAL_TABLET | Freq: Once | ORAL | Status: AC
  Administered 2022-11-28: 8 mg via ORAL

## 2022-11-28 MED ORDER — LACTATED RINGERS IV BOLUS: 1000.0000 mL | Freq: Once | INTRAVENOUS | Status: DC

## 2022-11-28 MED ORDER — LACTATED RINGERS IV BOLUS
500.0000 mL | Freq: Once | INTRAVENOUS | Status: AC
  Administered 2022-11-28: 500 mL via INTRAVENOUS

## 2022-11-28 MED ORDER — MECLIZINE HCL 25 MG PO TABS
12.5000 mg | ORAL_TABLET | Freq: Once | ORAL | Status: AC
  Administered 2022-11-28: 12.5 mg via ORAL
  Filled 2022-11-28: qty 1

## 2022-11-28 MED ORDER — IOHEXOL 300 MG/ML  SOLN
125.0000 mL | Freq: Once | INTRAMUSCULAR | Status: AC | PRN
  Administered 2022-11-28: 125 mL via INTRAVENOUS

## 2022-11-28 MED ORDER — SODIUM CHLORIDE 0.9 % IV BOLUS
1000.0000 mL | Freq: Once | INTRAVENOUS | Status: AC
  Administered 2022-11-28: 1000 mL via INTRAVENOUS

## 2022-11-28 MED ORDER — LACTATED RINGERS IV BOLUS
1000.0000 mL | Freq: Once | INTRAVENOUS | Status: AC
  Administered 2022-11-28: 1000 mL via INTRAVENOUS

## 2022-11-28 MED ORDER — PROCHLORPERAZINE EDISYLATE 10 MG/2ML IJ SOLN
10.0000 mg | Freq: Once | INTRAMUSCULAR | Status: AC
  Administered 2022-11-29: 10 mg via INTRAVENOUS
  Filled 2022-11-28: qty 2

## 2022-11-28 MED ORDER — ONDANSETRON 8 MG PO TBDP: 8.0000 mg | ORAL_TABLET | Freq: Three times a day (TID) | ORAL | 0 refills | Status: DC | PRN

## 2022-11-28 MED ORDER — ACETAMINOPHEN 325 MG PO TABS
650.0000 mg | ORAL_TABLET | Freq: Once | ORAL | Status: AC
  Administered 2022-11-29: 650 mg via ORAL
  Filled 2022-11-28: qty 2

## 2022-11-28 NOTE — ED Triage Notes (Signed)
Pt sent from UC for emesis x 2 days after getting overheated outside , not tolerating po intake at this time   H/o DM, normal CBG at home of 172 pta

## 2022-11-28 NOTE — Discharge Instructions (Signed)
You need to go to the ER for a higher level of care

## 2022-11-28 NOTE — ED Notes (Signed)
Pt offered water, took one sip and his nausea returned. Pt requesting more nausea medication. Dr. Denton Lank informed.

## 2022-11-28 NOTE — ED Provider Notes (Signed)
[facility] F[facility]    CSN: [id number] Arrival date & time: 11/28/22  1714      History   Chief Complaint Chief Complaint  Patient presents with   Emesis   Night Sweats    HPI Robert Frey is a 62 y.o. male.   HPI Patient states that he thinks that he had a heatstroke on Sunday.  All day yesterday Monday and today Tuesday he has had vomiting.  Productive vomiting.  Cannot keep down water.  States that he had a little bit of urine this morning but has not had any more urine throughout the day.  Has been unable to take his medications.  He states that he is sweating constantly.  He feels very weak.  When he tries to stand up he is wobbly.  No coughing or chest congestion.  No headache or body ache.  No sensation of infection.  Past Medical History:  Diagnosis Date   Ankylosing spondylitis ([name])    COPD (chronic obstructive pulmonary disease) ([name])    Diabetes mellitus without complication ([name])    High cholesterol    Hypertension    PTSD (post-traumatic stress disorder)     Patient Active Problem List   Diagnosis Date Noted   Dyslipidemia, goal LDL below 70 01/17/2022   Charcot's joint of right foot 08/03/2021   Syndesmotic disruption of right ankle 08/01/2021   Family history of blood clots 07/26/2021   Leg edema, right 07/26/2021   Right leg pain 07/26/2021   Absent pedal pulses 07/13/2021   Acute right ankle pain 07/13/2021   Diabetic foot infection ([name]) 06/28/2021   Psoriasis 06/28/2021   Acute left-sided low back pain without sciatica 06/07/2021   Chronic foot pain, right 05/03/2021   Rib pain on left side 03/07/2021   Fall 03/07/2021   Family history of Alzheimer's disease 11/09/2020   Diabetic neuropathy ([name]) 01/28/2020   Current smoker 10/22/2019   Nosebleed 10/22/2019   Primary osteoarthritis, right glenohumeral joint 07/02/2019   Shoulder separation, right, subsequent encounter 06/17/2019   Essential tremor 04/16/2019   Shakes 04/04/2019   Dark  urine 01/01/2019   Lumbosacral strain 01/17/2017   Bruising 06/09/2015   Essential hypertension 06/08/2015   Microalbuminuria 06/08/2015   Lung nodule < 6cm on CT 05/15/2014   Heel spur 01/20/2014   Osteoarthritis of foot, left 01/20/2014   Psoriatic arthritis ([name]) 10/11/2012   Type 2 diabetes mellitus with microalbuminuria, with long-term current use of insulin ([name]) 10/11/2012   Ankylosing spondylitis of multiple sites in spine ([name]) 09/30/2009   HYPERCHOLESTEROLEMIA 03/06/2006   OBESITY, NOS 03/06/2006   OBSESSIVE COMPUL. DISORDER 03/06/2006    Past Surgical History:  Procedure Laterality Date   APPENDECTOMY     CARPAL TUNNEL RELEASE Right    ent surgery     ORIF CALCANEOUS FRACTURE Right 08/01/2021   Procedure: OPEN REDUCTION INTERNAL FIXATION (ORIF) FOOT FRACTURE;  Surgeon: Myrene Galas, MD;  Location: [facility] OR;  Service: Orthopedics;  Laterality: Right;   TONSILLECTOMY AND ADENOIDECTOMY         Home Medications    Prior to Admission medications   Medication Sig Start Date End Date Taking? Authorizing Provider  amLODipine (NORVASC) 5 MG tablet Take 1 tablet (5 mg total) by mouth daily. 09/11/22  Yes Breeback, Jade L, PA-C  ascorbic acid (VITAMIN C) 500 MG tablet Take 2 tablets (1,000 mg total) by mouth daily. 08/04/21  Yes Montez Morita, PA-C  atorvastatin (LIPITOR) 80 MG tablet Take 1 tablet (80 mg  total) by mouth daily. 06/12/22  Yes Breeback, Jade L, PA-C  Blood Glucose Monitoring Suppl DEVI 1 each by Does not apply route daily. May substitute to any manufacturer covered by patient's insurance. 09/11/22  Yes Breeback, Jade L, PA-C  clobetasol cream (TEMOVATE) 0.05 % Apply 1 application topically 2 (two) times daily. 01/03/19  Yes Breeback, Jade L, PA-C  Dapagliflozin Pro-metFORMIN ER (XIGDUO XR) 02-999 MG TB24 Take 1 tablet by mouth daily. 06/12/22  Yes Breeback, Jade L, PA-C  glucose blood (ACCU-CHEK GUIDE) test strip 1 EACH BY IN VITRO ROUTE IN THE MORNING, AT NOON, AND AT  BEDTIME. MAY SUBSTITUTE TO ANY MANUFACTURER COVERED BY PATIENT'S INSURANCE. 10/24/22  Yes Breeback, Jade L, PA-C  hydrochlorothiazide (HYDRODIURIL) 25 MG tablet Take 1 tablet (25 mg total) by mouth daily. 06/12/22  Yes Breeback, Jade L, PA-C  icosapent Ethyl (VASCEPA) 1 g capsule Take 2 capsules (2 g total) by mouth 2 (two) times daily. 07/07/21  Yes Breeback, Jade L, PA-C  imipramine (TOFRANIL) 50 MG tablet TAKE 4 TABLETS (200 MG TOTAL) BY MOUTH AT BEDTIME. 09/26/22  Yes Breeback, Jade L, PA-C  insulin glargine, 2 Unit Dial, (TOUJEO MAX SOLOSTAR) 300 UNIT/ML Solostar Pen Inject 18 Units into the skin 2 (two) times daily. 11/28/22  Yes Breeback, Jade L, PA-C  Insulin Pen Needle (PEN NEEDLES) 32G X 4 MM MISC Inject 1 Units into the skin daily. 11/06/16  Yes Sunnie Nielsen, DO  Multiple Vitamin (MULTIVITAMIN ADULT PO) Take 1 tablet by mouth daily.   Yes [provider]  olmesartan (BENICAR) 20 MG tablet Take 1 tablet (20 mg total) by mouth daily. 06/12/22  Yes Breeback, Jade L, PA-C  pregabalin (LYRICA) 75 MG capsule Take 1 capsule (75 mg total) by mouth 2 (two) times daily. 06/12/22  Yes Breeback, Jade L, PA-C  primidone (MYSOLINE) 50 MG tablet Take 1 tablet (50 mg total) by mouth daily. 06/12/22  Yes Breeback, Jade L, PA-C  Secukinumab (COSENTYX [place]) Inject into the skin every 30 (thirty) days.   Yes [provider]  tirzepatide Greggory Keen) 10 MG/0.5ML Pen Inject 10 mg into the skin once a week. 09/11/22  Yes Breeback, Jade L, PA-C  triamcinolone 0.5%-Eucerin equivalent 1:1 cream mixture Apply topically 2 (two) times daily. Mix 0.5% triamcinolone cream and Eucerin in a one-to-one mixture. 06/28/21  Yes Breeback, Jade L, PA-C  Vitamin D, Ergocalciferol, (DRISDOL) 1.25 MG (50000 UNIT) CAPS capsule Take 1 capsule (50,000 Units total) by mouth every 7 (seven) days. 08/09/21  Yes Montez Morita, PA-C    Family History Family History  Problem Relation Age of Onset   Stroke Other    Alcohol abuse  Other    Depression Other    Hypertension Other    Hyperlipidemia Other    Diabetes Mother    Alzheimer's disease Mother    Diabetes Sister    Stroke Sister     Social History Social History   Tobacco Use   Smoking status: Every Day    Packs/day: 1.00    Years: 25.00    Additional pack years: 0.00    Total pack years: 25.00    Types: Cigarettes   Smokeless tobacco: Never  Vaping Use   Vaping Use: Never used  Substance Use Topics   Alcohol use: Yes    Alcohol/week: 2.0 standard drinks of alcohol    Types: 2 Cans of beer per week    Comment: not since early April 2024   Drug use: No  Allergies   Green dyes, Propranolol, Latex, and Zithromax [azithromycin dihydrate]   Review of Systems Review of Systems See HPI  Physical Exam Triage Vital Signs ED Triage Vitals [11/28/22 1724]  Enc Vitals Group     BP (!) 191/88     Pulse Rate (!) 108     Resp 18     Temp 98.2 F (36.8 C)     Temp Source Oral     SpO2 95 %     Weight      Height      Head Circumference      Peak Flow      Pain Score      Pain Loc      Pain Edu?      Excl. in GC?    No data found.  Updated Vital Signs BP (!) 191/88 (BP Location: Left Arm)   Pulse (!) 108   Temp 98.2 F (36.8 C) (Oral)   Resp 18   SpO2 95%      Physical Exam Constitutional:      General: He is in acute distress.     Appearance: He is well-developed. He is obese. He is ill-appearing.     Comments: Patient is diaphoretic.  Clammy and very sweaty.  Holds onto furniture .  For stability  HENT:     Head: Normocephalic and atraumatic.     Mouth/Throat:     Mouth: Mucous membranes are dry.  Eyes:     Conjunctiva/sclera: Conjunctivae normal.     Pupils: Pupils are equal, round, and reactive to light.  Cardiovascular:     Rate and Rhythm: Tachycardia present.  Pulmonary:     Effort: Pulmonary effort is normal. No respiratory distress.     Breath sounds: Wheezing present.  Abdominal:     General: There is  no distension.     Palpations: Abdomen is soft.  Musculoskeletal:        General: Normal range of motion.     Cervical back: Normal range of motion.  Skin:    General: Skin is warm and dry.  Neurological:     Mental Status: He is alert.      UC Treatments / Results  Labs (all labs ordered are listed, but only abnormal results are displayed) Labs Reviewed  POCT FASTING CBG [facility] MANUAL ENTRY  POCT URINALYSIS DIP (MANUAL ENTRY)   Patient unable to give urine sample for testing Random blood sugar 176 EKG   Radiology No results found.  Procedures Procedures (including critical care time)  Medications Ordered in UC Medications  ondansetron (ZOFRAN-ODT) disintegrating tablet 8 mg (8 mg Oral Given 11/28/22 1732)    Initial Impression / Assessment and Plan / UC Course  I have reviewed the triage vital signs and the nursing notes.  Pertinent labs & imaging results that were available during my care of the patient were reviewed by me and considered in my medical decision making (see chart for details).     Patient has orthostatic symptoms, retracted vomiting, dehydration, oliguria.  He is an insulin-dependent diabetic.  I believe he needs laboratory evaluation and IV fluid resuscitation.  Will transfer him to an emergency room Final Clinical Impressions(s) / UC Diagnoses   Final diagnoses:  Uncontrollable vomiting  Dehydration     Discharge Instructions      You need to go to the ER for a higher level of care     ED Prescriptions   None    PDMP not reviewed  this encounter.   Eustace Moore, MD 11/28/22 [phone]

## 2022-11-28 NOTE — ED Provider Notes (Addendum)
[place] [facility] Provider Note   CSN: [id number] Arrival date & time: 11/28/22  1825     History  Chief Complaint  Patient presents with   Emesis    Robert Frey is a 62 y.o. male.  Pt  with nausea/vomiting, symptoms started yesterday. States had been outside previous and questions whether he got overheated. Nausea/vomiting persist today with several episodes. Emesis yellowish, not bloody or bilious. Abd cramping, general/diffuse. Having normal bms, no diarrhea or constipation. No known ill contacts or bad food ingestion. No change in meds or new meds. No fever or chills. No dysuria or gu c/o other than urinating less than normal today. No chest pain or discomfort. No sob or unusual doe. No cough or uri symptoms.   The history is provided by the patient, a relative and medical records.  Emesis Associated symptoms: no chills, no fever, no headaches and no sore throat        Home Medications Prior to Admission medications   Medication Sig Start Date End Date Taking? Authorizing Provider  ondansetron (ZOFRAN-ODT) 8 MG disintegrating tablet Take 1 tablet (8 mg total) by mouth every 8 (eight) hours as needed for nausea or vomiting. 11/28/22  Yes Cathren Laine, MD  amLODipine (NORVASC) 5 MG tablet Take 1 tablet (5 mg total) by mouth daily. 09/11/22   Breeback, Lonna Cobb, PA-C  ascorbic acid (VITAMIN C) 500 MG tablet Take 2 tablets (1,000 mg total) by mouth daily. 08/04/21   Montez Morita, PA-C  atorvastatin (LIPITOR) 80 MG tablet Take 1 tablet (80 mg total) by mouth daily. 06/12/22   Breeback, Lesly Rubenstein L, PA-C  Blood Glucose Monitoring Suppl DEVI 1 each by Does not apply route daily. May substitute to any manufacturer covered by patient's insurance. 09/11/22   Breeback, Jade L, PA-C  clobetasol cream (TEMOVATE) 0.05 % Apply 1 application topically 2 (two) times daily. 01/03/19   Breeback, Lonna Cobb, PA-C  Dapagliflozin Pro-metFORMIN ER (XIGDUO XR) 02-999 MG TB24 Take  1 tablet by mouth daily. 06/12/22   Breeback, Jade L, PA-C  glucose blood (ACCU-CHEK GUIDE) test strip 1 EACH BY IN VITRO ROUTE IN THE MORNING, AT NOON, AND AT BEDTIME. MAY SUBSTITUTE TO ANY MANUFACTURER COVERED BY PATIENT'S INSURANCE. 10/24/22   Breeback, Jade L, PA-C  hydrochlorothiazide (HYDRODIURIL) 25 MG tablet Take 1 tablet (25 mg total) by mouth daily. 06/12/22   Breeback, Lonna Cobb, PA-C  icosapent Ethyl (VASCEPA) 1 g capsule Take 2 capsules (2 g total) by mouth 2 (two) times daily. 07/07/21   Breeback, Jade L, PA-C  imipramine (TOFRANIL) 50 MG tablet TAKE 4 TABLETS (200 MG TOTAL) BY MOUTH AT BEDTIME. 09/26/22   Breeback, Jade L, PA-C  insulin glargine, 2 Unit Dial, (TOUJEO MAX SOLOSTAR) 300 UNIT/ML Solostar Pen Inject 18 Units into the skin 2 (two) times daily. 11/28/22   Breeback, Jade L, PA-C  Insulin Pen Needle (PEN NEEDLES) 32G X 4 MM MISC Inject 1 Units into the skin daily. 11/06/16   Sunnie Nielsen, DO  Multiple Vitamin (MULTIVITAMIN ADULT PO) Take 1 tablet by mouth daily.    [provider]  olmesartan (BENICAR) 20 MG tablet Take 1 tablet (20 mg total) by mouth daily. 06/12/22   Breeback, Jade L, PA-C  pregabalin (LYRICA) 75 MG capsule Take 1 capsule (75 mg total) by mouth 2 (two) times daily. 06/12/22   Breeback, Lonna Cobb, PA-C  primidone (MYSOLINE) 50 MG tablet Take 1 tablet (50 mg total) by mouth daily.  06/12/22   Breeback, Jade L, PA-C  Secukinumab (COSENTYX Felicity) Inject into the skin every 30 (thirty) days.    [provider]  tirzepatide Greggory Keen) 10 MG/0.5ML Pen Inject 10 mg into the skin once a week. 09/11/22   Breeback, Jade L, PA-C  triamcinolone 0.5%-Eucerin equivalent 1:1 cream mixture Apply topically 2 (two) times daily. Mix 0.5% triamcinolone cream and Eucerin in a one-to-one mixture. 06/28/21   Breeback, Lonna Cobb, PA-C  Vitamin D, Ergocalciferol, (DRISDOL) 1.25 MG (50000 UNIT) CAPS capsule Take 1 capsule (50,000 Units total) by mouth every 7 (seven) days. 08/09/21   Montez Morita, PA-C      Allergies    Green dyes, Propranolol, Latex, and Zithromax [azithromycin dihydrate]    Review of Systems   Review of Systems  Constitutional:  Negative for chills and fever.  HENT:  Negative for sore throat.   Eyes:  Negative for redness.  Respiratory:  Negative for shortness of breath.   Cardiovascular:  Negative for chest pain and leg swelling.  Gastrointestinal:  Positive for nausea and vomiting.  Genitourinary:  Positive for decreased urine volume. Negative for dysuria and flank pain.  Musculoskeletal:  Negative for back pain and neck pain.  Skin:  Negative for rash.  Neurological:  Negative for headaches.  Hematological:  Does not bruise/bleed easily.  Psychiatric/Behavioral:  Negative for confusion.     Physical Exam Updated Vital Signs BP (!) 150/76   Pulse (!) 110   Temp 98.6 F (37 C)   Resp 17   Ht 1.803 m (5\' 11" )   Wt 105.2 kg   SpO2 98%   BMI 32.35 kg/m  Physical Exam Vitals and nursing note reviewed.  Constitutional:      Appearance: Normal appearance. He is well-developed.  HENT:     Head: Atraumatic.     Nose: Nose normal.     Mouth/Throat:     Mouth: Mucous membranes are moist.     Pharynx: Oropharynx is clear.  Eyes:     General: No scleral icterus.    Conjunctiva/sclera: Conjunctivae normal.     Pupils: Pupils are equal, round, and reactive to light.  Neck:     Trachea: No tracheal deviation.  Cardiovascular:     Rate and Rhythm: Regular rhythm. Tachycardia present.     Pulses: Normal pulses.     Heart sounds: Normal heart sounds. No murmur heard.    No friction rub. No gallop.  Pulmonary:     Effort: Pulmonary effort is normal. No accessory muscle usage or respiratory distress.     Breath sounds: Normal breath sounds.  Abdominal:     General: Bowel sounds are normal. There is no distension.     Palpations: Abdomen is soft.     Tenderness: There is abdominal tenderness. There is no guarding.     Comments: Mid abdominal  tenderness.   Genitourinary:    Comments: No cva tenderness. Musculoskeletal:        General: No swelling or tenderness.     Cervical back: Normal range of motion and neck supple. No rigidity.     Right lower leg: No edema.     Left lower leg: No edema.  Skin:    General: Skin is warm and dry.     Findings: No rash.  Neurological:     Mental Status: He is alert.     Comments: Alert, speech clear.   Psychiatric:        Mood and Affect: Mood normal.  ED Results / Procedures / Treatments   Labs (all labs ordered are listed, but only abnormal results are displayed) Results for orders placed or performed during the hospital encounter of 11/28/22  Lipase, blood  Result Value Ref Range   Lipase 29 11 - 51 U/L  Comprehensive metabolic panel  Result Value Ref Range   Sodium 131 (L) 135 - 145 mmol/L   Potassium 3.8 3.5 - 5.1 mmol/L   Chloride 87 (L) 98 - 111 mmol/L   CO2 23 22 - 32 mmol/L   Glucose, Bld 159 (H) 70 - 99 mg/dL   BUN 18 6 - 20 mg/dL   Creatinine, Ser 5.40 0.61 - 1.24 mg/dL   Calcium 98.1 8.9 - 19.1 mg/dL   Total Protein 9.0 (H) 6.5 - 8.1 g/dL   Albumin 4.9 3.5 - 5.0 g/dL   AST 35 15 - 41 U/L   ALT 39 0 - 44 U/L   Alkaline Phosphatase 73 38 - 126 U/L   Total Bilirubin 1.1 0.3 - 1.2 mg/dL   GFR, Estimated >47 >82 mL/min   Anion gap 21 (H) 5 - 15  CBC  Result Value Ref Range   WBC 12.1 (H) 4.0 - 10.5 K/uL   RBC 4.89 4.22 - 5.81 MIL/uL   Hemoglobin 15.5 13.0 - 17.0 g/dL   HCT 95.6 21.3 - 08.6 %   MCV 87.9 80.0 - 100.0 fL   MCH 31.7 26.0 - 34.0 pg   MCHC 36.0 30.0 - 36.0 g/dL   RDW 57.8 46.9 - 62.9 %   Platelets 240 150 - 400 K/uL   nRBC 0.0 0.0 - 0.2 %  CK  Result Value Ref Range   Total CK 111 49 - 397 U/L  Troponin I (High Sensitivity)  Result Value Ref Range   Troponin I (High Sensitivity) 5 <18 ng/L     EKG EKG Interpretation Date/Time:  Tuesday November 28 2022 18:44:46 EDT Ventricular Rate:  115 PR Interval:  138 QRS Duration:  153 QT  Interval:  359 QTC Calculation: 497 R Axis:   265  Text Interpretation: Sinus tachycardia RBBB and LAFB No previous tracing Confirmed by Cathren Laine ([id number]) on 11/28/2022 6:48:24 PM  Radiology CT ABDOMEN PELVIS W CONTRAST  Result Date: 11/28/2022 CLINICAL DATA:  Abdominal pain, emesis. EXAM: CT ABDOMEN AND PELVIS WITH CONTRAST TECHNIQUE: Multidetector CT imaging of the abdomen and pelvis was performed using the standard protocol following bolus administration of intravenous contrast. RADIATION DOSE REDUCTION: This exam was performed according to the departmental dose-optimization program which includes automated exposure control, adjustment of the mA and/or kV according to patient size and/or use of iterative reconstruction technique. CONTRAST:  OMNIPAQUE IOHEXOL 300 MG/ML  SOLN COMPARISON:  09/07/2014. FINDINGS: Lower chest: Coronary artery calcifications are noted. A stable 4 mm nodule is noted in the left lower lobe, axial image 14, unchanged from 2016 and likely benign. Hepatobiliary: No focal abnormality in the liver. The liver has a nodular contour compatible with underlying cirrhosis. No biliary ductal dilatation. A few small stones are present within the gallbladder. Pancreas: Unremarkable. No pancreatic ductal dilatation or surrounding inflammatory changes. Spleen: Normal in size without focal abnormality. Adrenals/Urinary Tract: The adrenal glands are within normal limits. The kidneys enhance symmetrically. A nonobstructive renal calculus is noted on the right. No hydroureteronephrosis bilaterally. The bladder is unremarkable. Stomach/Bowel: Stomach is within normal limits. Appendix is surgically absent. No evidence of bowel wall thickening, distention, or inflammatory changes. No free air or pneumatosis. Vascular/Lymphatic: Aortic  atherosclerosis. No enlarged abdominal or pelvic lymph nodes. Reproductive: Prostate is unremarkable. Other: No abdominopelvic ascites. Musculoskeletal:  Degenerative changes are present in the thoracolumbar spine. No acute osseous abnormality. IMPRESSION: 1. No acute intra-abdominal process. 2. Hepatic cirrhosis. 3. Cholelithiasis. 4. Coronary artery calcifications. 5. Aortic atherosclerosis. Electronically Signed   By: Thornell Sartorius M.D.   On: 11/28/2022 20:57    Procedures Procedures    Medications Ordered in ED Medications  sodium chloride 0.9 % bolus 1,000 mL (has no administration in time range)  lactated ringers bolus 1,000 mL (1,000 mLs Intravenous New Bag/Given 11/28/22 2005)  ondansetron (ZOFRAN) injection 4 mg (4 mg Intravenous Given 11/28/22 2003)  iohexol (OMNIPAQUE) 300 MG/ML solution 125 mL (125 mLs Intravenous Contrast Given 11/28/22 2030)    ED Course/ Medical Decision Making/ A&P                             Medical Decision Making Problems Addressed: Dehydration: acute illness or injury with systemic symptoms that poses a threat to life or bodily functions Elevated blood pressure reading: acute illness or injury Gallstones: chronic illness or injury Hyponatremia: acute illness or injury Nausea and vomiting in adult: acute illness or injury with systemic symptoms that poses a threat to life or bodily functions Pulmonary nodule, left: chronic illness or injury  Amount and/or Complexity of Data Reviewed Independent Historian:     Details: Family, hx External Data Reviewed: notes. Labs: ordered. Decision-making details documented in ED Course. Radiology: ordered and independent interpretation performed. Decision-making details documented in ED Course. ECG/medicine tests: ordered and independent interpretation performed. Decision-making details documented in ED Course.  Risk Prescription drug management. Decision regarding hospitalization.   Iv ns. Continuous pulse ox and cardiac monitoring. Labs ordered/sent. Imaging ordered.   Differential diagnosis includes gastroenteritis, colitis, dehydration, aki, etc. Dispo  decision including potential need for admission considered - will get labs and imaging and reassess.   Reviewed nursing notes and prior charts for additional history. External reports reviewed. Additional history from: family.  LR bolus. Zofran iv.   Cardiac monitor: sinus rhythm, rate 110.  Labs reviewed/interpreted by me - wbc mildly high. Hct normal. Trop normal.   CT reviewed/interpreted by me - no sbo. Gallstones. Small lung nodule similar to prior.   Additional ivf. Po fluids.   No recurrent emesis. Abd soft nt.   Pt currently appears stable for d/c.   Rec pcp f/u (including as relates incidental findings shared and f/u).  Signed out to Dr Bernette Mayers to recheck post ivf, po trial (anticipate probable d/c to home then).            Final Clinical Impression(s) / ED Diagnoses Final diagnoses:  Nausea and vomiting in adult  Dehydration  Hyponatremia  Gallstones  Elevated blood pressure reading  Pulmonary nodule, left    Rx / DC Orders ED Discharge Orders          Ordered    ondansetron (ZOFRAN-ODT) 8 MG disintegrating tablet  Every 8 hours PRN        11/28/22 2111                Cathren Laine, MD 11/28/22 2311

## 2022-11-28 NOTE — Discharge Instructions (Addendum)
It was our pleasure to provide your ER care today - we hope that you feel better.  Drink plenty of fluids/stay well hydrated. Take zofran as need for nausea.  Follow up with primary care doctor in 1-2 days if symptoms fail to improve/resolve.  Your CT scan made incidental note of small gallstones, and a small 4 mm left lung nodule, and liver appearance of cirrhosis - follow up with your primary care doctor and discuss these incidental findings and follow up with them.   Return to ER if worse, new symptoms, fevers, persistent vomiting, severe abdominal pain, or other concern.

## 2022-11-28 NOTE — ED Triage Notes (Signed)
Pt reports a 2-day history of vomiting and sweats. Pt stated he has not been able to keep anything on his stomach.Pt reports fatigue and sweats.

## 2022-11-28 NOTE — ED Notes (Signed)
Dr. Steinl at bedside 

## 2022-11-28 NOTE — ED Notes (Signed)
Lab notified to add-on CK, and troponin, to previously collected blood  sample.

## 2022-11-29 ENCOUNTER — Telehealth: Payer: Self-pay | Admitting: General Practice

## 2022-11-29 MED ORDER — PROCHLORPERAZINE MALEATE 10 MG PO TABS: 10.0000 mg | ORAL_TABLET | Freq: Two times a day (BID) | ORAL | 0 refills | Status: DC | PRN

## 2022-11-29 NOTE — ED Notes (Signed)
Pt given diet ginger ale and he is attempting to drink it. Will re-assess momentarily.

## 2022-11-29 NOTE — ED Provider Notes (Signed)
Assumed care of patient at shift change pending PO trial. He required one additional dose of antiemetics (compazine) with significant improvement. No vomiting and ready to go home.    Pollyann Savoy, MD 11/29/22 [phone]

## 2022-11-29 NOTE — ED Notes (Signed)
..  The patient is A&OX4, ambulatory at d/c with independent steady gait, NAD. Pt verbalized understanding of d/c instructions, prescriptions and follow up care.  

## 2022-11-29 NOTE — Transitions of Care (Post Inpatient/ED Visit) (Signed)
   11/29/2022  Name: Robert Frey MRN: [id number] DOB: 01/16/1961  Today's TOC FU Call Status: Today's TOC FU Call Status:: Unsuccessul Call (1st Attempt) Unsuccessful Call (1st Attempt) Date: 11/29/22  Attempted to reach the patient regarding the most recent Inpatient/ED visit.  Follow Up Plan: Additional outreach attempts will be made to reach the patient to complete the Transitions of Care (Post Inpatient/ED visit) call.   Signature [place] Charon, Control and instrumentation engineer

## 2022-12-04 NOTE — Transitions of Care (Post Inpatient/ED Visit) (Signed)
   12/04/2022  Name: Robert Frey MRN: [id number] DOB: 08-14-1960  Today's TOC FU Call Status: Today's TOC FU Call Status:: Unsuccessful Call (2nd Attempt) Unsuccessful Call (1st Attempt) Date: 11/29/22 Unsuccessful Call (2nd Attempt) Date: 12/04/22  Attempted to reach the patient regarding the most recent Inpatient/ED visit.  Follow Up Plan: Additional outreach attempts will be made to reach the patient to complete the Transitions of Care (Post Inpatient/ED visit) call.   Signature [place] Charon, Control and instrumentation engineer

## 2022-12-05 ENCOUNTER — Encounter: Payer: Self-pay | Admitting: Physician Assistant

## 2022-12-05 ENCOUNTER — Ambulatory Visit (INDEPENDENT_AMBULATORY_CARE_PROVIDER_SITE_OTHER): Payer: Commercial Managed Care - PPO | Admitting: Physician Assistant

## 2022-12-05 VITALS — BP 143/65 | HR 109 | Ht 71.0 in | Wt 223.0 lb

## 2022-12-05 DIAGNOSIS — E1129 Type 2 diabetes mellitus with other diabetic kidney complication: Secondary | ICD-10-CM

## 2022-12-05 DIAGNOSIS — Z09 Encounter for follow-up examination after completed treatment for conditions other than malignant neoplasm: Secondary | ICD-10-CM

## 2022-12-05 DIAGNOSIS — E871 Hypo-osmolality and hyponatremia: Secondary | ICD-10-CM | POA: Diagnosis not present

## 2022-12-05 DIAGNOSIS — I10 Essential (primary) hypertension: Secondary | ICD-10-CM

## 2022-12-05 DIAGNOSIS — E860 Dehydration: Secondary | ICD-10-CM | POA: Insufficient documentation

## 2022-12-05 DIAGNOSIS — R112 Nausea with vomiting, unspecified: Secondary | ICD-10-CM

## 2022-12-05 DIAGNOSIS — T675XXD Heat exhaustion, unspecified, subsequent encounter: Secondary | ICD-10-CM

## 2022-12-05 DIAGNOSIS — Z794 Long term (current) use of insulin: Secondary | ICD-10-CM

## 2022-12-05 DIAGNOSIS — T675XXA Heat exhaustion, unspecified, initial encounter: Secondary | ICD-10-CM | POA: Insufficient documentation

## 2022-12-05 DIAGNOSIS — D72829 Elevated white blood cell count, unspecified: Secondary | ICD-10-CM

## 2022-12-05 DIAGNOSIS — R809 Proteinuria, unspecified: Secondary | ICD-10-CM | POA: Diagnosis not present

## 2022-12-05 LAB — POCT GLYCOSYLATED HEMOGLOBIN (HGB A1C): Hemoglobin A1C: 6.7 % — AB (ref 4.0–5.6)

## 2022-12-05 LAB — CBC WITH DIFFERENTIAL/PLATELET
Basophils Absolute: 62 cells/uL (ref 0–200)
Basophils Relative: 0.6 %
HCT: 41.9 % (ref 38.5–50.0)
Hemoglobin: 14.6 g/dL (ref 13.2–17.1)
MCHC: 34.8 g/dL (ref 32.0–36.0)
MCV: 92.9 fL (ref 80.0–100.0)
Monocytes Relative: 12.2 %
Neutro Abs: 6644 cells/uL (ref 1500–7800)
Neutrophils Relative %: 64.5 %
Platelets: 225 10*3/uL (ref 140–400)

## 2022-12-05 NOTE — Patient Instructions (Signed)
Get labs today  Things to try for nose irritation:  Nasal ayr gel Coconut oil Aquafor  Preventing Heat Exhaustion, Adult Heat exhaustion happens when your body gets too hot (overheated) from hot weather, exercise, strenuous physical activity, dehydration, or a combination of these. If untreated, heat exhaustion could lead to heat stroke, which is a medical emergency and can be life-threatening. How can heat exhaustion affect me? Early warning signs of heat exhaustion include: Weakness. Fatigue. Heat rash. This red, blotchy skin rash is also called prickly heat. Swelling of the legs and feet. Stomach cramps. Arm pain. Leg cramps. When heat exhaustion symptoms progress, they can include: Heavy sweating. Body temperature of 102.50F (39C) or higher. Clammy skin. Rapid, weak pulse. Nausea or vomiting. Dizziness. Headache. Difficulty focusing or concentrating. Fainting. If you have signs of heat exhaustion, move to a cool place, loosen your clothing, and drink water or a sports drink. Then do these things: Put cool, wet compresses on your body or get into a cool bath or shower. Remove all unnecessary clothing to expose as much skin as possible to ventilated air. Fans and cool water mists over your skin are very important. What can increase my risk? People who work or exercise outside in hot weather have the highest risk of heat exhaustion. You may also be at higher risk if you: Are over age 58. Older adults have a greater risk for heat exhaustion than younger adults. Live alone, especially in a home without air conditioning or proper ventilation. Are very overweight (obese). Have high blood pressure. Have certain chronic medical conditions, such as heart disease, poor circulation or other vascular diseases, sickle cell disease, high blood pressure, diabetes, lung disease, or cystic fibrosis, or you have had a stroke. Take certain medicines for high blood pressure, or you take  antihistamines or tranquilizers. Drink alcohol excessively or use drugs, such as cocaine, heroin, or amphetamines. What actions can I take to prevent heat exhaustion? Eating and drinking     Drink enough water or sports drinks to keep your urine pale yellow. When it is hot, drink every 15 to 20 minutes, even if you are not thirsty. Do not go out in the heat after a heavy meal. Do not drink alcohol or caffeinated drinks when it is very hot outside. Lifestyle Avoid being outside on very hot days. Check your local news for extreme heat alerts or warnings. In extreme heat, stay in an air-conditioned environment until the temperature cools off. Wear lightweight, light-colored, and loose-fitting clothing in warm weather. This allows for better air circulation over the skin. Protect yourself from the sun by wearing a broad-brimmed hat and using a broad-spectrum sunscreen that is SPF 15 or higher. Activity Check with your health care provider before starting any new exercise or activity. Ask about any health conditions or medicines that might increase your risk for heat exhaustion. Do outdoor activities when it is cooler. This may be in the morning, late afternoon, or evening. Take breaks in the shade. Do not work or exercise in the heat when you feel unwell or have been sick. Start any new work or exercise activity gradually. General information Older adults need extra precautions to protect against heat exhaustion. Follow these tips: Do not leave an older adult alone in a hot car. Make sure older adults have access to air conditioning on very hot days. Remind them to drink enough fluids. Check on them at least twice a day if you can. Where to find more information Centers  for Disease Control and Prevention: FootballExhibition.com.br American Academy of Family Physicians: familydoctor.org American Academy of Orthopedic Surgeons: orthoinfo.aaos.org Contact a health care provider if you: Faint. Feel weak or  dizzy. Have any signs or symptoms of heat exhaustion that last more than 1 hour. These may include muscle cramps, fatigue, redness of the face and neck, and headache. Get help right away if you: Have signs of heat stroke, including: Body temperature of 104F (40C) or higher. Hot, dry, red skin. Fast, thumping pulse. Severe nausea, vomiting, or both. Confusion. These symptoms may represent a serious problem that is an emergency. Do not wait to see if the symptoms will go away. Get medical help right away. Call your local emergency services (911 in the [place] Do not drive yourself to the hospital. Summary Heat exhaustion happens when your body gets too hot (overheated) from hot weather, exercise, strenuous physical activity, dehydration, or a combination of these. Avoid being outside on very hot days. When you are out in the heat, stay hydrated, wear light clothing, and take breaks in shaded areas to cool down. If you have signs of heat exhaustion, get out of the heat, drink fluids, take steps to cool down, and contact a health care provider. Cooling down may include taking off all unnecessary clothing, using cooling fans, and water misting. Make sure you are drinking fluids, such as water or sports drinks with electrolytes. Contact a health care provider if you have signs or symptoms of heat exhaustion that last more than 1 hour, such as muscle cramps, fatigue, and headache. This information is not intended to replace advice given to you by your health care provider. Make sure you discuss any questions you have with your health care provider. Document Revised: 11/10/2019 Document Reviewed: 11/10/2019 Elsevier Patient Education  2024 [employer].

## 2022-12-05 NOTE — Progress Notes (Signed)
Established Patient Office Visit  Subjective   Patient ID: Robert MATTHEY, male    DOB: 1961/02/07  Age: 62 y.o. MRN: [id number]  Chief Complaint  Patient presents with   Follow-up    ER F/U    HPI Pt is a 62 yo male with T2DM, HTN, HLD who presents to the clinic to follow up from hospital on 11/28/2022 for dehydration, nausea/vomiting, hyponatremia, heat exhaustion. He went fishing and only had one bottle of gatorade. He stopped sweating and felt really bad. He started vomiting and could not keep anything down. He went to [facility] and was sent to ED. Sodium was 131 and WBC 12.1. He was given 2L of fluids and zofran. He feels much better today.    .. Active Ambulatory Problems    Diagnosis Date Noted   HYPERCHOLESTEROLEMIA 03/06/2006   OBESITY, NOS 03/06/2006   OBSESSIVE COMPUL. DISORDER 03/06/2006   Ankylosing spondylitis of multiple sites in spine ([name]) 09/30/2009   Psoriatic arthritis ([name]) 10/11/2012   Type 2 diabetes mellitus with microalbuminuria, with long-term current use of insulin ([name]) 10/11/2012   Heel spur 01/20/2014   Osteoarthritis of foot, left 01/20/2014   Lung nodule < 6cm on CT 05/15/2014   Essential hypertension 06/08/2015   Microalbuminuria 06/08/2015   Bruising 06/09/2015   Lumbosacral strain 01/17/2017   Dark urine 01/01/2019   Shakes 04/04/2019   Essential tremor 04/16/2019   Shoulder separation, right, subsequent encounter 06/17/2019   Primary osteoarthritis, right glenohumeral joint 07/02/2019   Current smoker 10/22/2019   Nosebleed 10/22/2019   Diabetic neuropathy ([name]) 01/28/2020   Family history of Alzheimer's disease 11/09/2020   Rib pain on left side 03/07/2021   Fall 03/07/2021   Chronic foot pain, right 05/03/2021   Acute left-sided low back pain without sciatica 06/07/2021   Diabetic foot infection ([name]) 06/28/2021   Psoriasis 06/28/2021   Absent pedal pulses 07/13/2021   Acute right ankle pain 07/13/2021   Family history of blood clots  07/26/2021   Leg edema, right 07/26/2021   Right leg pain 07/26/2021   Syndesmotic disruption of right ankle 08/01/2021   Charcot's joint of right foot 08/03/2021   Dyslipidemia, goal LDL below 70 01/17/2022   Heat exhaustion 12/05/2022   Dehydration 12/05/2022   Hyponatremia 12/05/2022   Resolved Ambulatory Problems    Diagnosis Date Noted   KNEE PAIN, BILATERAL 12/28/2006   ELEVATED BLOOD PRESSURE WITHOUT DIAGNOSIS OF HYPERTENSION 08/29/2006   Cough 11/25/2010   Past Medical History:  Diagnosis Date   Ankylosing spondylitis ([name])    COPD (chronic obstructive pulmonary disease) ([name])    Diabetes mellitus without complication ([name])    High cholesterol    Hypertension    PTSD (post-traumatic stress disorder)      ROS See HPI.    Objective:     BP (!) 143/65 (BP Location: Left Arm, Patient Position: Sitting, Cuff Size: Large)   Pulse (!) 109   Ht 5\' 11"  (1.803 m)   Wt 223 lb (101.2 kg)   SpO2 98%   BMI 31.10 kg/m  BP Readings from Last 3 Encounters:  12/05/22 (!) 143/65  11/29/22 (!) 155/80  11/28/22 (!) 191/88   Wt Readings from Last 3 Encounters:  12/05/22 223 lb (101.2 kg)  11/28/22 231 lb 14.8 oz (105.2 kg)  09/11/22 232 lb (105.2 kg)    .Marland Kitchen Results for orders placed or performed in visit on 12/05/22  POCT HgB A1C  Result Value Ref Range   Hemoglobin A1C 6.7 (  A) 4.0 - 5.6 %   HbA1c POC (<> result, manual entry)     HbA1c, POC (prediabetic range)     HbA1c, POC (controlled diabetic range)       Physical Exam Constitutional:      Appearance: Normal appearance. He is obese.  HENT:     Head: Normocephalic.  Cardiovascular:     Rate and Rhythm: Normal rate and regular rhythm.  Pulmonary:     Effort: Pulmonary effort is normal.     Breath sounds: Normal breath sounds.  Musculoskeletal:     Right lower leg: No edema.     Left lower leg: No edema.  Neurological:     General: No focal deficit present.     Mental Status: He is alert and oriented to  person, place, and time.  Psychiatric:        Mood and Affect: Mood normal.      The 10-year ASCVD risk score (Arnett DK, et al., 2019) is: 28.4%    Assessment & Plan:  Marland KitchenMarland KitchenLashan was seen today for follow-up.  Diagnoses and all orders for this visit:  Hospital discharge follow-up  Hyponatremia -     COMPLETE METABOLIC PANEL WITH GFR -     CBC w/Diff/Platelet  Dehydration -     COMPLETE METABOLIC PANEL WITH GFR -     CBC w/Diff/Platelet  Nausea and vomiting, unspecified vomiting type -     COMPLETE METABOLIC PANEL WITH GFR -     CBC w/Diff/Platelet  Essential hypertension -     COMPLETE METABOLIC PANEL WITH GFR  Leukocytosis, unspecified type -     COMPLETE METABOLIC PANEL WITH GFR -     CBC w/Diff/Platelet  Type 2 diabetes mellitus with microalbuminuria, with long-term current use of insulin ([name]) -     POCT HgB A1C  Heat exhaustion, subsequent encounter   Pt is doing better today Recheck CBC and CMP in labs Vitals improved but BP still elevated some Recheck at home goal is under 130/80 Discussed heat exhaustion prevention and HO given  A1C improved today Follow up in 3 months.     Return in about 3 months (around 03/07/2023) for after 15th of month.    Tandy Gaw, PA-C

## 2022-12-06 LAB — COMPLETE METABOLIC PANEL WITH GFR
AG Ratio: 1.7 (calc) (ref 1.0–2.5)
ALT: 34 U/L (ref 9–46)
AST: 33 U/L (ref 10–35)
Albumin: 5.2 g/dL — ABNORMAL HIGH (ref 3.6–5.1)
Alkaline phosphatase (APISO): 63 U/L (ref 35–144)
BUN: 23 mg/dL (ref 7–25)
CO2: 25 mmol/L (ref 20–32)
Calcium: 10.5 mg/dL — ABNORMAL HIGH (ref 8.6–10.3)
Chloride: 93 mmol/L — ABNORMAL LOW (ref 98–110)
Creat: 1.08 mg/dL (ref 0.70–1.30)
Globulin: 3 g/dL (calc) (ref 1.9–3.7)
Glucose, Bld: 123 mg/dL — ABNORMAL HIGH (ref 65–99)
Potassium: 4.8 mmol/L (ref 3.5–5.3)
Sodium: 132 mmol/L — ABNORMAL LOW (ref 135–146)
Total Bilirubin: 0.6 mg/dL (ref 0.2–1.2)
Total Protein: 8.2 g/dL — ABNORMAL HIGH (ref 6.1–8.1)
eGFR: 79 mL/min/{1.73_m2} (ref >=60)

## 2022-12-06 LAB — CBC WITH DIFFERENTIAL/PLATELET
Absolute Monocytes: 1257 cells/uL — ABNORMAL HIGH (ref 200–950)
Eosinophils Absolute: 237 cells/uL (ref 15–500)
Eosinophils Relative: 2.3 %
Lymphs Abs: 2101 cells/uL (ref 850–3900)
MCH: 32.4 pg (ref 27.0–33.0)
MPV: 8.6 fL (ref 7.5–12.5)
RBC: 4.51 10*6/uL (ref 4.20–5.80)
RDW: 14.9 % (ref 11.0–15.0)
Total Lymphocyte: 20.4 %
WBC: 10.3 10*3/uL (ref 3.8–10.8)

## 2022-12-06 NOTE — Progress Notes (Signed)
Aryon,   Kidneys still look a little dry. Sodium is up some. I would consider getting liquid IV packets and drinking one a day to help with electrolytes and hydration.  Potassium good.  wBC normal range now.

## 2022-12-14 ENCOUNTER — Encounter: Payer: Self-pay | Admitting: Emergency Medicine

## 2022-12-14 ENCOUNTER — Ambulatory Visit
Admission: EM | Admit: 2022-12-14 | Discharge: 2022-12-14 | Disposition: A | Discharge status: Home / Self Care | Payer: Commercial Managed Care - PPO | Attending: Family Medicine | Admitting: Family Medicine

## 2022-12-14 DIAGNOSIS — M542 Cervicalgia: Secondary | ICD-10-CM

## 2022-12-14 MED ORDER — KETOROLAC TROMETHAMINE 30 MG/ML IJ SOLN
30.0000 mg | Freq: Once | INTRAMUSCULAR | Status: AC
  Administered 2022-12-14: 30 mg via INTRAMUSCULAR

## 2022-12-14 MED ORDER — TIZANIDINE HCL 4 MG PO TABS: 4.0000 mg | ORAL_TABLET | Freq: Three times a day (TID) | ORAL | 0 refills | Status: DC | PRN

## 2022-12-14 MED ORDER — KETOROLAC TROMETHAMINE 10 MG PO TABS: 10.0000 mg | ORAL_TABLET | Freq: Four times a day (QID) | ORAL | 0 refills | Status: DC | PRN

## 2022-12-14 NOTE — ED Triage Notes (Signed)
Patient c/o neck stiffness and pain for several days.  Difficulty turning his head from side to side.  Patient has been applying heat pack and Ibuprofen, no relief.

## 2022-12-14 NOTE — Discharge Instructions (Signed)
You have been given a shot of Toradol 30 mg today.  Ketorolac 10 mg tablets--take 1 tablet every 6 hours as needed for pain.  This is the same medicine that is in the shot we just gave you (Do not take the ibuprofen or naproxen while taking the ketorolac)    Take tizanidine 4 mg--1 every 8 hours as needed for muscle spasms; this medication can cause dizziness and sleepiness  Please followup with your primary care about this issue. They may need to do further testing if you are not improving, or may refer you to PT

## 2022-12-14 NOTE — ED Provider Notes (Signed)
[facility]    CSN: [id number] Arrival date & time: 12/14/22  1550      History   Chief Complaint Chief Complaint  Patient presents with   Torticollis    HPI Robert Frey is a 62 y.o. male.   HPI Here for left sided neck pain. It began bothering him 7/15. He has seen his primary care, and ibuprofen and warm/cold therapies recommended. No trauma or fall. No URI symptoms/ear pain/n/v/d/rash/f/c.  It hurts more to turn is head to either side, and also when he swallows, he feel more pain in the left external neck.  No numbness or tingling of the hand/arm  Past Medical History:  Diagnosis Date   Ankylosing spondylitis ([name])    COPD (chronic obstructive pulmonary disease) ([name])    Diabetes mellitus without complication ([name])    High cholesterol    Hypertension    PTSD (post-traumatic stress disorder)     Patient Active Problem List   Diagnosis Date Noted   Heat exhaustion 12/05/2022   Dehydration 12/05/2022   Hyponatremia 12/05/2022   Dyslipidemia, goal LDL below 70 01/17/2022   Charcot's joint of right foot 08/03/2021   Syndesmotic disruption of right ankle 08/01/2021   Family history of blood clots 07/26/2021   Leg edema, right 07/26/2021   Right leg pain 07/26/2021   Absent pedal pulses 07/13/2021   Acute right ankle pain 07/13/2021   Diabetic foot infection ([name]) 06/28/2021   Psoriasis 06/28/2021   Acute left-sided low back pain without sciatica 06/07/2021   Chronic foot pain, right 05/03/2021   Rib pain on left side 03/07/2021   Fall 03/07/2021   Family history of Alzheimer's disease 11/09/2020   Diabetic neuropathy ([name]) 01/28/2020   Current smoker 10/22/2019   Nosebleed 10/22/2019   Primary osteoarthritis, right glenohumeral joint 07/02/2019   Shoulder separation, right, subsequent encounter 06/17/2019   Essential tremor 04/16/2019   Shakes 04/04/2019   Dark urine 01/01/2019   Lumbosacral strain 01/17/2017   Bruising 06/09/2015    Essential hypertension 06/08/2015   Microalbuminuria 06/08/2015   Lung nodule < 6cm on CT 05/15/2014   Heel spur 01/20/2014   Osteoarthritis of foot, left 01/20/2014   Psoriatic arthritis ([name]) 10/11/2012   Type 2 diabetes mellitus with microalbuminuria, with long-term current use of insulin ([name]) 10/11/2012   Ankylosing spondylitis of multiple sites in spine ([name]) 09/30/2009   HYPERCHOLESTEROLEMIA 03/06/2006   OBESITY, NOS 03/06/2006   OBSESSIVE COMPUL. DISORDER 03/06/2006    Past Surgical History:  Procedure Laterality Date   APPENDECTOMY     CARPAL TUNNEL RELEASE Right    ent surgery     ORIF CALCANEOUS FRACTURE Right 08/01/2021   Procedure: OPEN REDUCTION INTERNAL FIXATION (ORIF) FOOT FRACTURE;  Surgeon: Myrene Galas, MD;  Location: [facility] OR;  Service: Orthopedics;  Laterality: Right;   TONSILLECTOMY AND ADENOIDECTOMY         Home Medications    Prior to Admission medications   Medication Sig Start Date End Date Taking? Authorizing Provider  amLODipine (NORVASC) 5 MG tablet Take 1 tablet (5 mg total) by mouth daily. 09/11/22  Yes Breeback, Jade L, PA-C  ascorbic acid (VITAMIN C) 500 MG tablet Take 2 tablets (1,000 mg total) by mouth daily. 08/04/21  Yes Montez Morita, PA-C  atorvastatin (LIPITOR) 80 MG tablet Take 1 tablet (80 mg total) by mouth daily. 06/12/22  Yes Breeback, Jade L, PA-C  Blood Glucose Monitoring Suppl DEVI 1 each by Does not apply route daily. May substitute to any  manufacturer covered by [employer]. 09/11/22  Yes Breeback, Jade L, PA-C  clobetasol cream (TEMOVATE) 0.05 % Apply 1 application topically 2 (two) times daily. 01/03/19  Yes Breeback, Jade L, PA-C  Dapagliflozin Pro-metFORMIN ER (XIGDUO XR) 02-999 MG TB24 Take 1 tablet by mouth daily. 06/12/22  Yes Breeback, Jade L, PA-C  glucose blood (ACCU-CHEK GUIDE) test strip 1 EACH BY IN VITRO ROUTE IN THE MORNING, AT NOON, AND AT BEDTIME. MAY SUBSTITUTE TO ANY MANUFACTURER COVERED BY PATIENT'S INSURANCE.  10/24/22  Yes Breeback, Jade L, PA-C  hydrochlorothiazide (HYDRODIURIL) 25 MG tablet Take 1 tablet (25 mg total) by mouth daily. 06/12/22  Yes Breeback, Jade L, PA-C  icosapent Ethyl (VASCEPA) 1 g capsule Take 2 capsules (2 g total) by mouth 2 (two) times daily. 07/07/21  Yes Breeback, Jade L, PA-C  imipramine (TOFRANIL) 50 MG tablet TAKE 4 TABLETS (200 MG TOTAL) BY MOUTH AT BEDTIME. 09/26/22  Yes Breeback, Jade L, PA-C  insulin glargine, 2 Unit Dial, (TOUJEO MAX SOLOSTAR) 300 UNIT/ML Solostar Pen Inject 18 Units into the skin 2 (two) times daily. 11/28/22  Yes Breeback, Jade L, PA-C  Insulin Pen Needle (PEN NEEDLES) 32G X 4 MM MISC Inject 1 Units into the skin daily. 11/06/16  Yes Sunnie Nielsen, DO  ketorolac (TORADOL) 10 MG tablet Take 1 tablet (10 mg total) by mouth every 6 (six) hours as needed (pain). 12/14/22  Yes Zenia Resides, MD  Multiple Vitamin (MULTIVITAMIN ADULT PO) Take 1 tablet by mouth daily.   Yes [provider]  olmesartan (BENICAR) 20 MG tablet Take 1 tablet (20 mg total) by mouth daily. 06/12/22  Yes Breeback, Jade L, PA-C  pregabalin (LYRICA) 75 MG capsule Take 1 capsule (75 mg total) by mouth 2 (two) times daily. 06/12/22  Yes Breeback, Jade L, PA-C  primidone (MYSOLINE) 50 MG tablet Take 1 tablet (50 mg total) by mouth daily. 06/12/22  Yes Breeback, Jade L, PA-C  prochlorperazine (COMPAZINE) 10 MG tablet Take 1 tablet (10 mg total) by mouth 2 (two) times daily as needed for nausea or vomiting. 11/29/22  Yes Pollyann Savoy, MD  Secukinumab (COSENTYX [place]) Inject into the skin every 30 (thirty) days.   Yes [provider]  tirzepatide Greggory Keen) 10 MG/0.5ML Pen Inject 10 mg into the skin once a week. 09/11/22  Yes Breeback, Jade L, PA-C  tiZANidine (ZANAFLEX) 4 MG tablet Take 1 tablet (4 mg total) by mouth every 8 (eight) hours as needed for muscle spasms. 12/14/22  Yes Zenia Resides, MD  triamcinolone 0.5%-Eucerin equivalent 1:1 cream mixture Apply topically 2  (two) times daily. Mix 0.5% triamcinolone cream and Eucerin in a one-to-one mixture. 06/28/21  Yes Breeback, Jade L, PA-C  Vitamin D, Ergocalciferol, (DRISDOL) 1.25 MG (50000 UNIT) CAPS capsule Take 1 capsule (50,000 Units total) by mouth every 7 (seven) days. 08/09/21  Yes Montez Morita, PA-C    Family History Family History  Problem Relation Age of Onset   Stroke Other    Alcohol abuse Other    Depression Other    Hypertension Other    Hyperlipidemia Other    Diabetes Mother    Alzheimer's disease Mother    Diabetes Sister    Stroke Sister     Social History Social History   Tobacco Use   Smoking status: Every Day    Current packs/day: 1.00    Average packs/day: 1 pack/day for 25.0 years (25.0 ttl pk-yrs)    Types: Cigarettes   Smokeless tobacco: Never  Vaping Use   Vaping status: Never Used  Substance Use Topics   Alcohol use: Yes    Alcohol/week: 2.0 standard drinks of alcohol    Types: 2 Cans of beer per week    Comment: not since early April 2024   Drug use: No     Allergies   Green dyes, Propranolol, Latex, and Zithromax [azithromycin dihydrate]   Review of Systems Review of Systems   Physical Exam Triage Vital Signs ED Triage Vitals  Encounter Vitals Group     BP 12/14/22 1604 (!) 150/73     Systolic BP Percentile --      Diastolic BP Percentile --      Pulse Rate 12/14/22 1604 (!) 105     Resp 12/14/22 1604 18     Temp 12/14/22 1604 98.3 F (36.8 C)     Temp Source 12/14/22 1604 Oral     SpO2 12/14/22 1604 98 %     Weight --      Height --      Head Circumference --      Peak Flow --      Pain Score 12/14/22 1605 7     Pain Loc --      Pain Education --      Exclude from Growth Chart --    No data found.  Updated Vital Signs BP (!) 150/73 (BP Location: Right Arm)   Pulse (!) 105   Temp 98.3 F (36.8 C) (Oral)   Resp 18   SpO2 98%   Visual Acuity Right Eye Distance:   Left Eye Distance:   Bilateral Distance:    Right Eye Near:    Left Eye Near:    Bilateral Near:     Physical Exam Vitals reviewed.  Constitutional:      General: He is not in acute distress.    Appearance: He is not ill-appearing, toxic-appearing or diaphoretic.  HENT:     Left Ear: Tympanic membrane and ear canal normal.     Nose: Nose normal.     Mouth/Throat:     Mouth: Mucous membranes are moist.     Pharynx: No oropharyngeal exudate or posterior oropharyngeal erythema.  Eyes:     Extraocular Movements: Extraocular movements intact.     Conjunctiva/sclera: Conjunctivae normal.     Pupils: Pupils are equal, round, and reactive to light.  Neck:     Comments: There is tenderness of the left trapezius and anterior to it also. No erythema or adenopathy. No rash and no swelling of the external tissues of the neck. Cardiovascular:     Rate and Rhythm: Normal rate and regular rhythm.     Heart sounds: No murmur heard. Pulmonary:     Effort: Pulmonary effort is normal.     Breath sounds: Normal breath sounds.  Skin:    Coloration: Skin is not jaundiced or pale.  Neurological:     General: No focal deficit present.     Mental Status: He is alert and oriented to person, place, and time.  Psychiatric:        Behavior: Behavior normal.      UC Treatments / Results  Labs (all labs ordered are listed, but only abnormal results are displayed) Labs Reviewed - No data to display  EKG   Radiology No results found.  Procedures Procedures (including critical care time)  Medications Ordered in UC Medications  ketorolac (TORADOL) 30 MG/ML injection 30 mg (has no administration in time range)  Initial Impression / Assessment and Plan / UC Course  I have reviewed the triage vital signs and the nursing notes.  Pertinent labs & imaging results that were available during my care of the patient were reviewed by me and considered in my medical decision making (see chart for details).        VS reassuring overall. It does still seem  to be muscular, though his arthritis may be contributing. No signs of soft tissue infection at this time.  Toradol injection given, and tablets of same sent to pharmacy for pain. Tizanidine sent in for muscle spasm. I have asked him to f/u with primary care Final Clinical Impressions(s) / UC Diagnoses   Final diagnoses:  Neck pain     Discharge Instructions      You have been given a shot of Toradol 30 mg today.  Ketorolac 10 mg tablets--take 1 tablet every 6 hours as needed for pain.  This is the same medicine that is in the shot we just gave you (Do not take the ibuprofen or naproxen while taking the ketorolac)    Take tizanidine 4 mg--1 every 8 hours as needed for muscle spasms; this medication can cause dizziness and sleepiness  Please followup with your primary care about this issue. They may need to do further testing if you are not improving, or may refer you to PT     ED Prescriptions     Medication Sig Dispense Auth. Provider   ketorolac (TORADOL) 10 MG tablet Take 1 tablet (10 mg total) by mouth every 6 (six) hours as needed (pain). 20 tablet Maynor Mwangi, Janace Aris, MD   tiZANidine (ZANAFLEX) 4 MG tablet Take 1 tablet (4 mg total) by mouth every 8 (eight) hours as needed for muscle spasms. 15 tablet Christine Schiefelbein, Janace Aris, MD      I have reviewed the PDMP during this encounter.   Zenia Resides, MD 12/14/22 [phone]

## 2022-12-18 NOTE — Transitions of Care (Post Inpatient/ED Visit) (Signed)
   12/18/2022  Name: Robert Frey MRN: [id number] DOB: 04-Jun-1960  Today's TOC FU Call Status: Today's TOC FU Call Status:: Successful TOC FU Call Competed Unsuccessful Call (1st Attempt) Date: 11/29/22 Unsuccessful Call (2nd Attempt) Date: 12/04/22 [facility] FU Call Complete Date: 12/18/22  Transition Care Management Follow-up Telephone Call Date of Discharge: 11/29/22 Discharge Facility: [facility] Type of Discharge: Emergency Department Reason for ED Visit: Other: (dehydration, nausea and vomiting)  Items Reviewed:   Patient had OV with PCP on 12/05/22.   SIGNATURE Modesto Charon, RN BSN Nurse Health Advisor

## 2022-12-19 ENCOUNTER — Ambulatory Visit (INDEPENDENT_AMBULATORY_CARE_PROVIDER_SITE_OTHER): Payer: Commercial Managed Care - PPO | Admitting: Behavioral Health

## 2022-12-19 ENCOUNTER — Encounter: Payer: Self-pay | Admitting: Behavioral Health

## 2022-12-19 DIAGNOSIS — F4310 Post-traumatic stress disorder, unspecified: Secondary | ICD-10-CM
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
## 2022-12-26 ENCOUNTER — Other Ambulatory Visit: Payer: Self-pay | Admitting: Physician Assistant

## 2022-12-26 DIAGNOSIS — M545 Low back pain, unspecified: Secondary | ICD-10-CM

## 2022-12-26 DIAGNOSIS — M450 Ankylosing spondylitis of multiple sites in spine: Secondary | ICD-10-CM

## 2022-12-26 DIAGNOSIS — E1342 Other specified diabetes mellitus with diabetic polyneuropathy: Secondary | ICD-10-CM

## 2022-12-26 NOTE — Telephone Encounter (Signed)
Last OV: 12/05/22 Next OV: 03/14/23 Last RF: 06/12/22

## 2023-01-16 ENCOUNTER — Encounter: Payer: Self-pay | Admitting: Behavioral Health

## 2023-01-16 ENCOUNTER — Other Ambulatory Visit: Payer: Self-pay | Admitting: Physician Assistant

## 2023-01-16 ENCOUNTER — Ambulatory Visit (INDEPENDENT_AMBULATORY_CARE_PROVIDER_SITE_OTHER): Payer: Commercial Managed Care - PPO | Admitting: Behavioral Health

## 2023-01-16 DIAGNOSIS — F4310 Post-traumatic stress disorder, unspecified: Secondary | ICD-10-CM

## 2023-01-16 DIAGNOSIS — F411 Generalized anxiety disorder: Secondary | ICD-10-CM

## 2023-01-16 DIAGNOSIS — I10 Essential (primary) hypertension: Secondary | ICD-10-CM

## 2023-01-16 NOTE — Progress Notes (Signed)
[facility] Counselor/Therapist Progress Note  Patient ID: Robert Frey, MRN: [id number],    Date: 01/16/2023  Time Spent: 8 AM to 9 AM,    60 minutes spent in person with the patient in outpatient therapy office.    Treatment Type: Individual Therapy  Reported Symptoms: Generalized anxiety disorder/stress, patient having repeated weird and/or bad dreams related to his past.  Mental Status Exam: Appearance:  Fairly Groomed     Behavior: Appropriate  Motor: Normal  Speech/Language:  Clear and Coherent  Affect: Appropriate  Mood: sad  Thought process: normal  Thought content:   WNL  Sensory/Perceptual disturbances:   WNL  Orientation: oriented to person, place, time/date, situation, day of week, and month of year  Attention: Good  Concentration: Good  Memory: WNL  Fund of knowledge:  Good  Insight:   Good  Judgment:  Good  Impulse Control: Good   Risk Assessment: Danger to Self:  No Self-injurious Behavior: No Danger to Others: No Duty to Warn:no Physical Aggression / Violence:No  Access to Firearms a concern: No  Gang Involvement:No   Subjective: The patient has not had the opportunity to fish as much because it has been so hot but he is starting back to do that.  This very good therapy for him.  He has not had any alcohol or been tempted to drink.  There has been some conflict at work with his new boss but he said he set a very firm boundaries with her including in front of the owner of the company and things have been better since then.  He says he wants to work well with her but he does not if he or his coworkers the respect they deserve.  He feels like it will get better now.  He continues to have what he describes as weird dreams but they do not appear to be as often or as intense.  There are times they just do not make sense at times that he can definitely see a connection to his childhood.  He had 1 recently where he was around a childhood bully and that  bully let him when a couple of fights in the dream to build his confidence and then brought other friends and they were going to overwhelm him.  In history and they were surprised at how good a fighter he was and how well he stood up for himself.  He feels like that mirrored a part of his life when he felt boxed him with limited options growing up and had to fight for everything he got and says dreams like that makes sense to him especially as we continue to process his childhood.  His son is home from [place] and he is thankful to have him home.  He feels like other than the conflict with her manager at work is still going well.  For the most part it is okay with his wife although she still has a busy work schedule.  Encouraged continued use of coping skills. The patient does contract for safety having no thoughts of hurting himself or anyone else.  Interventions: Cognitive Behavioral Therapy and Dialectical Behavioral Therapy  Diagnosis:PTSD  Plan: I will meet with the patient every 2 to 3 weeks in person. Treatment plan: We will use cognitive behavioral therapy principles as well as elements of dialectical behavior therapy to reduce depression and anxiety at least 50% over the next 6 months.  Target date will be March 29, 2023.  Goals for  depression are to have less sadness as indicated by patient report and PH-9 score, to have improved mood and return to a healthier level of functioning and to identify historical or current causes for depressed mood and learn how to cope with the depression.  Interventions will be to use CBT to explore and replace unhealthy thoughts and behavior patterns and encouraged sharing of feelings related to causes of symptoms of depression including dreams and history, teach and encouraged the use of coping skills for managing depressive symptoms.  Goals for reducing anxiety/stress are to improve his ability to manage anxiety symptoms and better handle stress, identify causes  for anxiety and stress to manage thoughts and worrisome thinking contributing to feelings of anxiety.  We will facilitate solution skills for helping identify stressors, teach coping skills for reducing anxiety and stress and use cognitive behavioral therapy to identify and change anxiety producing thoughts and behavior patterns.  Lastly we will use DBT distress tolerance and mindfulness skills [name], [facility]                                                             [name], [facility]               [name], [facility]               [name], [facility]               [name], [facility]

## 2023-01-21 IMAGING — DX DG ANKLE COMPLETE 3+V*R*
3 series · 3 of 3 positions shown · non-contrast
Comparison: None.

CLINICAL DATA: Lateral ankle pain

EXAM:
RIGHT ANKLE - COMPLETE 3+ VIEW

[ankle ap]
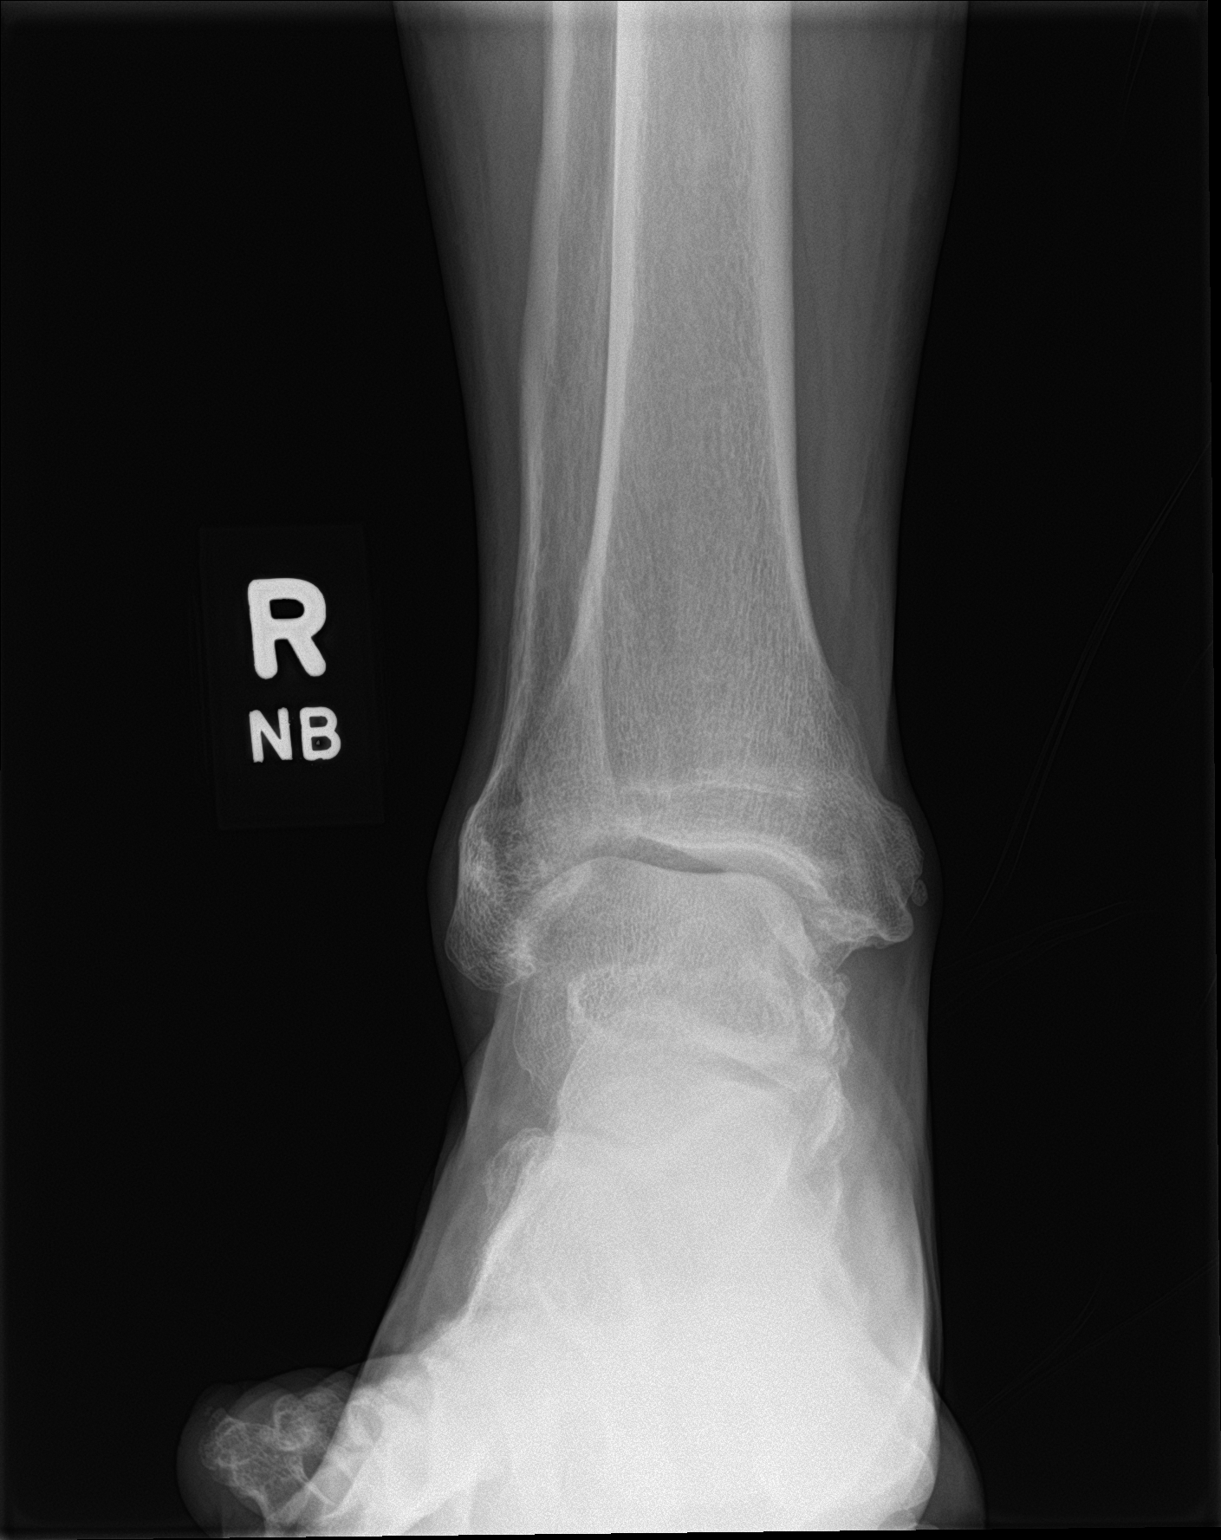

[ankle obl]
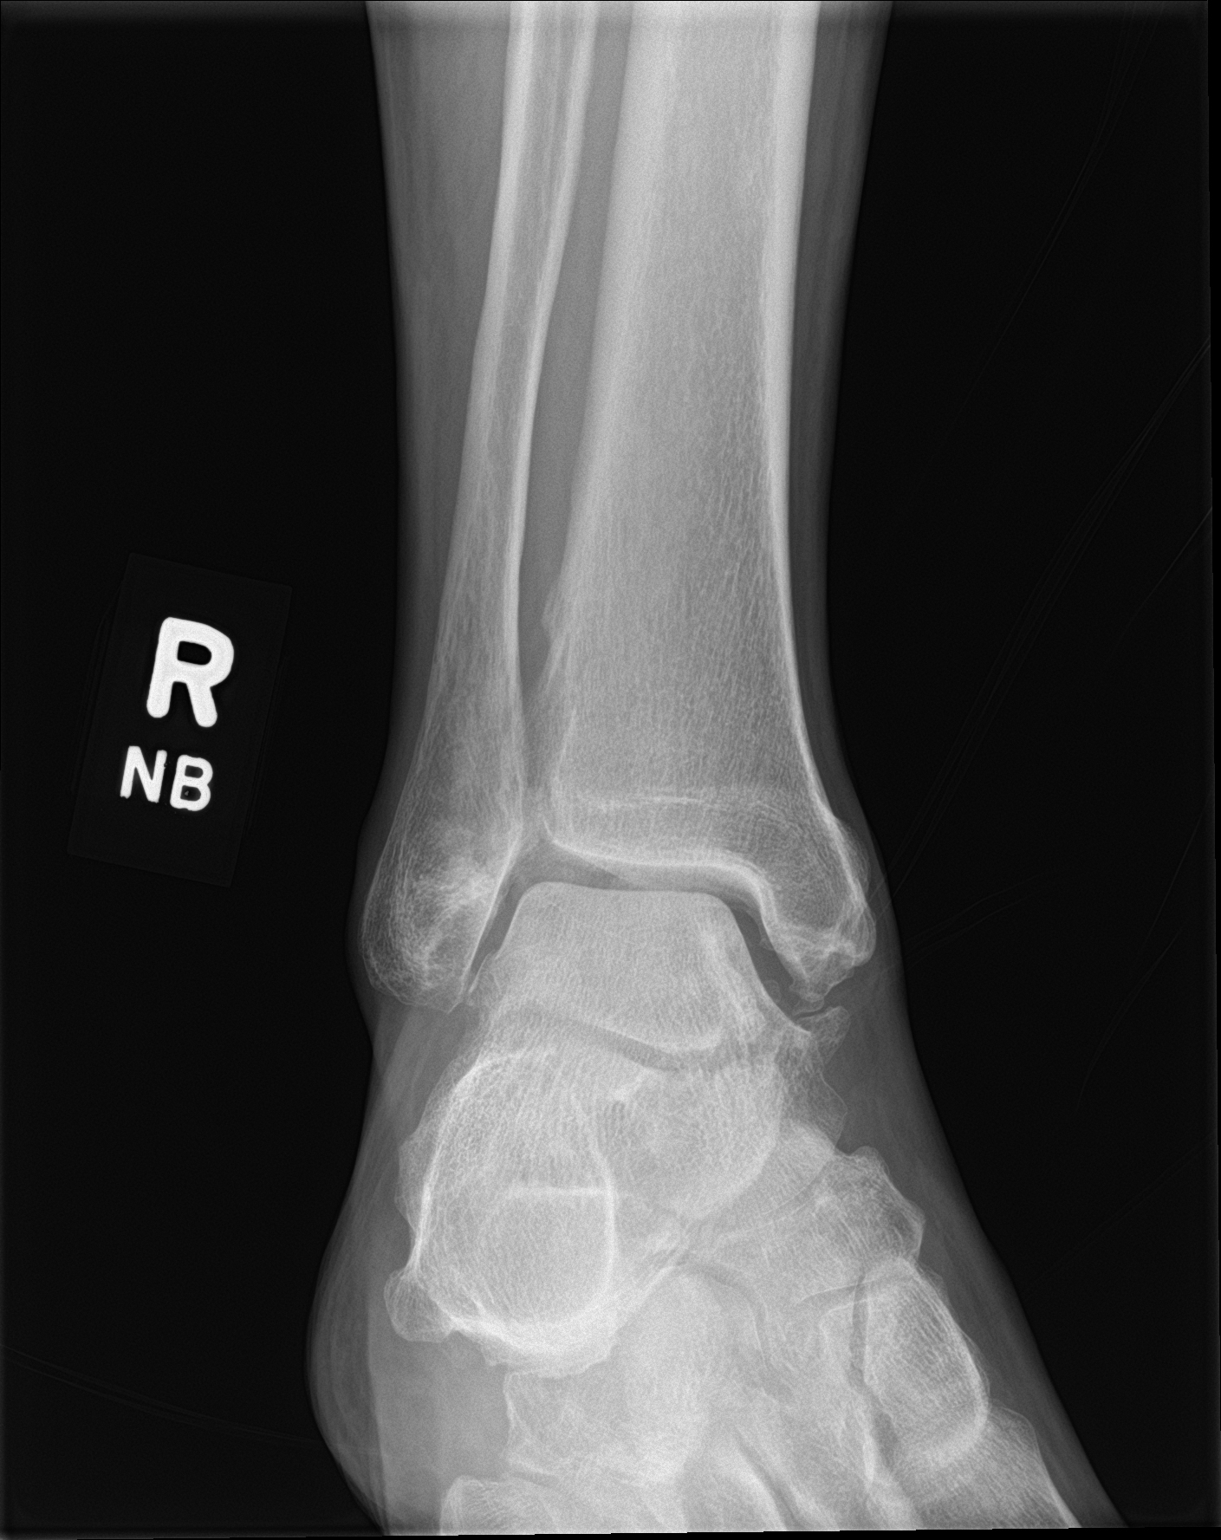

[ankle lat]
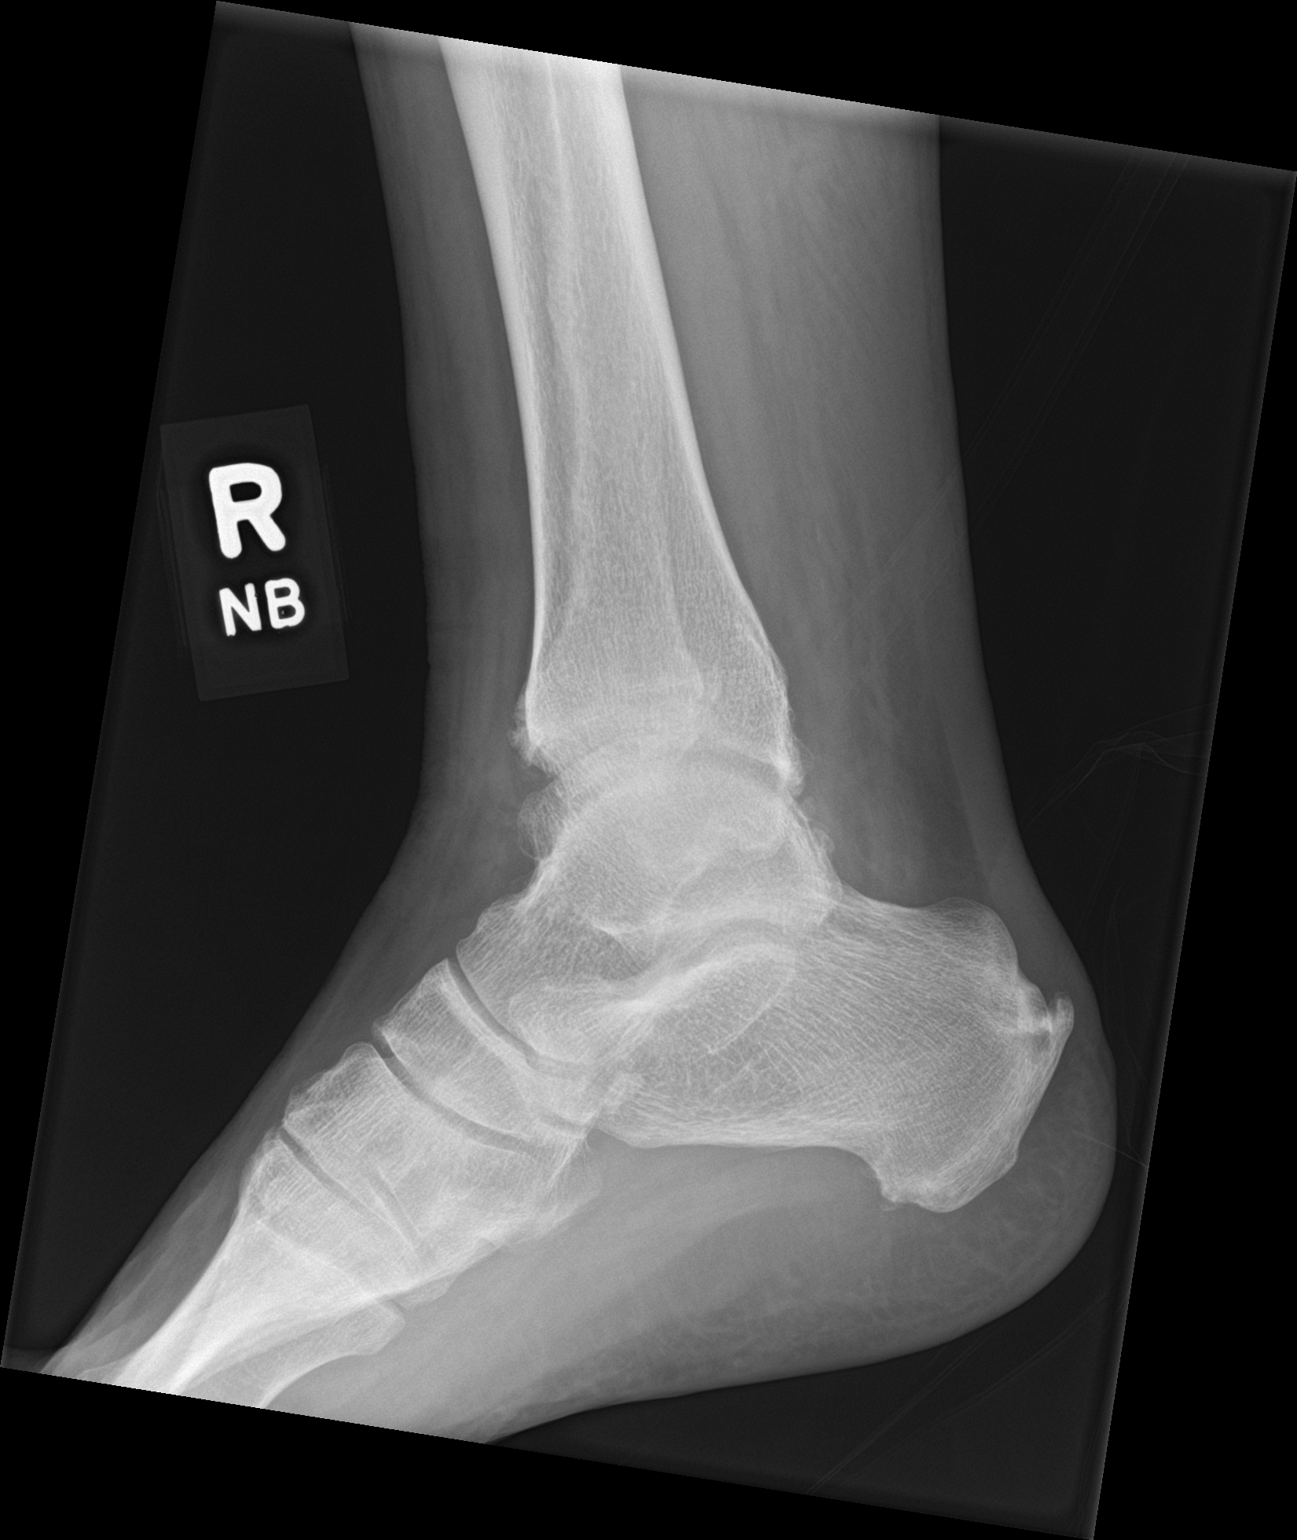

[3 of 3 positions shown; findings below may reference images not displayed]

FINDINGS: No fracture or malalignment. Degenerative changes medially and
laterally. No soft tissue swelling. Ankle mortise is symmetric.
IMPRESSION: Mild to moderate degenerative changes of the medial and lateral
ankle joint. No acute osseous abnormality.

## 2023-02-01 ENCOUNTER — Ambulatory Visit: Payer: Commercial Managed Care - PPO | Admitting: Family Medicine

## 2023-02-13 ENCOUNTER — Ambulatory Visit (INDEPENDENT_AMBULATORY_CARE_PROVIDER_SITE_OTHER): Payer: Commercial Managed Care - PPO | Admitting: Behavioral Health

## 2023-02-13 ENCOUNTER — Encounter: Payer: Self-pay | Admitting: Behavioral Health

## 2023-02-13 DIAGNOSIS — F411 Generalized anxiety disorder: Secondary | ICD-10-CM

## 2023-02-13 NOTE — Progress Notes (Signed)
[facility] Counselor/Therapist Progress Note  Patient ID: Robert Frey, MRN: [id number],    Date: 02/13/2023  Time Spent: 8;02 AM to 8:58 a.m.,   56 minutes spent in person with the patient in outpatient therapy office.    Treatment Type: Individual Therapy  Reported Symptoms: Generalized anxiety disorder/stress, patient having repeated weird and/or bad dreams related to his past.  Mental Status Exam: Appearance:  Fairly Groomed     Behavior: Appropriate  Motor: Normal  Speech/Language:  Clear and Coherent  Affect: Appropriate  Mood: sad  Thought process: normal  Thought content:   WNL  Sensory/Perceptual disturbances:   WNL  Orientation: oriented to person, place, time/date, situation, day of week, and month of year  Attention: Good  Concentration: Good  Memory: WNL  Fund of knowledge:  Good  Insight:   Good  Judgment:  Good  Impulse Control: Good   Risk Assessment: Danger to Self:  No Self-injurious Behavior: No Danger to Others: No Duty to Warn:no Physical Aggression / Violence:No  Access to Firearms a concern: No  Gang Involvement:No   Subjective: Patient reported that he is not having as many "crazy" dreams averaging about 1/week.  He did say though they are still fairly intense and usually involve him fighting against something or someone trying to hurt him or him being abandoned.  Last night he was in a foreign [place] and at 6:00 he had a lot of other people had to be put into holding cells.  He could not find his wife and started frantically looking for her kicking a gun out of someone's hand.  He said in reality he was kicking the wall so hard it broke the skin on his toes.  Most of the dreams have are similar to that.  He knows the common theme relates to his childhood.  He has been thinking more lately about no one really did anything about the situation with he and his mom.  He feels it would have been much different if his father had not  died so young.  He feels that his father's sister's husband wanted to adopt him but his father's sister said they could not afford it and he remembers them arguing about it.  His grandfather also died when the patient was very young and he feels his life could have been different if he had lived longer.  In the conversation with a cousin as an adult the cousin said that he was aware that the patient and his mom were struggling but was a child and did not realize the extent of it or know what he could have done.  He says he is spent most of his life fighting to be different than what his childhood was saying he would never be hot cold or hungry and he is not.  He knows that he could have done well in school based health intelligence level but was never given a chance.  He cannot understand why people did not see how small he was and realized that he was not eating or eating well.  We talked about learning to realize that he had very little that he could have done but was forced to be almost adult like even when young.  He started looking acceptance not in terms of saying it is okay but accepting the fact that he get all he could as a child and has done well as an adult.  We will continue to  process dreams as he has them and his childhood and how that relates to he is now. The patient does contract for safety having no thoughts of hurting himself or anyone else.  Interventions: Cognitive Behavioral Therapy and Dialectical Behavioral Therapy  Diagnosis:PTSD  Plan: I will meet with the patient every 2 to 3 weeks in person. Treatment plan: We will use cognitive behavioral therapy principles as well as elements of dialectical behavior therapy to reduce depression and anxiety at least 50% over the next 6 months.  Target date will be March 29, 2023.  Goals for depression are to have less sadness as indicated by patient report and PH-9 score, to have improved mood and return to a healthier level of functioning and  to identify historical or current causes for depressed mood and learn how to cope with the depression.  Interventions will be to use CBT to explore and replace unhealthy thoughts and behavior patterns and encouraged sharing of feelings related to causes of symptoms of depression including dreams and history, teach and encouraged the use of coping skills for managing depressive symptoms.  Goals for reducing anxiety/stress are to improve his ability to manage anxiety symptoms and better handle stress, identify causes for anxiety and stress to manage thoughts and worrisome thinking contributing to feelings of anxiety.  We will facilitate solution skills for helping identify stressors, teach coping skills for reducing anxiety and stress and use cognitive behavioral therapy to identify and change anxiety producing thoughts and behavior patterns.  Lastly we will use DBT distress tolerance and mindfulness skills [name], [facility]                                                             [name], [facility]               [name], [facility]               [name], [facility]               [name], [facility]          [name], [facility]

## 2023-02-19 ENCOUNTER — Ambulatory Visit
Admission: EM | Admit: 2023-02-19 | Discharge: 2023-02-19 | Discharge status: Against Medical Advice | Payer: Commercial Managed Care - PPO

## 2023-02-28 ENCOUNTER — Ambulatory Visit (INDEPENDENT_AMBULATORY_CARE_PROVIDER_SITE_OTHER): Payer: Commercial Managed Care - PPO | Admitting: Physician Assistant

## 2023-02-28 ENCOUNTER — Encounter: Payer: Self-pay | Admitting: Physician Assistant

## 2023-02-28 VITALS — BP 152/78 | HR 99 | Ht 71.0 in | Wt 224.0 lb

## 2023-02-28 DIAGNOSIS — I10 Essential (primary) hypertension: Secondary | ICD-10-CM | POA: Diagnosis not present

## 2023-02-28 DIAGNOSIS — B353 Tinea pedis: Secondary | ICD-10-CM | POA: Insufficient documentation

## 2023-02-28 DIAGNOSIS — L409 Psoriasis, unspecified: Secondary | ICD-10-CM

## 2023-02-28 DIAGNOSIS — T148XXA Other injury of unspecified body region, initial encounter: Secondary | ICD-10-CM | POA: Diagnosis not present

## 2023-02-28 MED ORDER — DOXYCYCLINE HYCLATE 100 MG PO TABS: 100.0000 mg | ORAL_TABLET | Freq: Two times a day (BID) | ORAL | 0 refills | Status: DC

## 2023-02-28 MED ORDER — KETOCONAZOLE 2 % EX CREA
1.0000 | TOPICAL_CREAM | Freq: Two times a day (BID) | CUTANEOUS | 1 refills | Status: AC
Start: 2023-02-28 — End: ?

## 2023-02-28 MED ORDER — FLUCONAZOLE 150 MG PO TABS: ORAL_TABLET | ORAL | 0 refills | Status: DC

## 2023-02-28 MED ORDER — METHYLPREDNISOLONE SODIUM SUCC 125 MG IJ SOLR
125.0000 mg | Freq: Once | INTRAMUSCULAR | Status: AC
Start: 2023-02-28 — End: 2023-02-28
  Administered 2023-02-28: 125 mg via INTRAMUSCULAR

## 2023-02-28 NOTE — Progress Notes (Signed)
Established Patient Office Visit  Subjective   Patient ID: Robert Frey, male    DOB: 1960-10-20  Age: 62 y.o. MRN: [id number]  Chief Complaint  Patient presents with   Rash    Rash on hand and feet  onset for 2 weeks     HPI  Patient is a 62 year old male who presents today with rash on his hands, feet, abdomen, chest, and a rash on his proximal right shin.   Rash on his hands, feet, abdomen, and chest began two weeks ago. Patient denies any medication changes, recent illness, or environmental exposure. Patient does have a history of psoriasis. Rash appears in thick, erythematous patch with scaling over the top on the abdomen and as smaller erythematous patches over the chest, hands, and feet. Patient does complain of intense itching but no pain and states that he has used triamcinolone cream but hasn't taken any prednisone. Patients feet are cracked in multiple areas on bilateral feet. Two open shallow wounds present, one on each foot at base of toes on plantar surface.   Patient additionally has rash over the anterior surface of his right shin. Rash is erythematous in a confluent patch but not scaling. Rash does itch but patient denies any pain.   . Active Ambulatory Problems    Diagnosis Date Noted   HYPERCHOLESTEROLEMIA 03/06/2006   OBESITY, NOS 03/06/2006   OBSESSIVE COMPUL. DISORDER 03/06/2006   Ankylosing spondylitis of multiple sites in spine ([name]) 09/30/2009   Psoriatic arthritis ([name]) 10/11/2012   Type 2 diabetes mellitus with microalbuminuria, with long-term current use of insulin ([name]) 10/11/2012   Heel spur 01/20/2014   Osteoarthritis of foot, left 01/20/2014   Lung nodule < 6cm on CT 05/15/2014   Essential hypertension 06/08/2015   Microalbuminuria 06/08/2015   Bruising 06/09/2015   Lumbosacral strain 01/17/2017   Dark urine 01/01/2019   Shakes 04/04/2019   Essential tremor 04/16/2019   Shoulder separation, right, subsequent encounter 06/17/2019   Primary  osteoarthritis, right glenohumeral joint 07/02/2019   Current smoker 10/22/2019   Nosebleed 10/22/2019   Diabetic neuropathy ([name]) 01/28/2020   Family history of Alzheimer's disease 11/09/2020   Rib pain on left side 03/07/2021   Fall 03/07/2021   Chronic foot pain, right 05/03/2021   Acute left-sided low back pain without sciatica 06/07/2021   Diabetic foot infection ([name]) 06/28/2021   Psoriasis 06/28/2021   Absent pedal pulses 07/13/2021   Acute right ankle pain 07/13/2021   Family history of blood clots 07/26/2021   Leg edema, right 07/26/2021   Right leg pain 07/26/2021   Syndesmotic disruption of right ankle 08/01/2021   Charcot's joint of right foot 08/03/2021   Dyslipidemia, goal LDL below 70 01/17/2022   Heat exhaustion 12/05/2022   Dehydration 12/05/2022   Hyponatremia 12/05/2022   Tinea pedis of both feet 02/28/2023   Resolved Ambulatory Problems    Diagnosis Date Noted   KNEE PAIN, BILATERAL 12/28/2006   ELEVATED BLOOD PRESSURE WITHOUT DIAGNOSIS OF HYPERTENSION 08/29/2006   Cough 11/25/2010   Past Medical History:  Diagnosis Date   Ankylosing spondylitis ([name])    COPD (chronic obstructive pulmonary disease) ([name])    Diabetes mellitus without complication ([name])    High cholesterol    Hypertension    PTSD (post-traumatic stress disorder)      ROS See HPI.    Objective:     BP (!) 152/78   Pulse 99   Ht 5\' 11"  (1.803 m)   Wt 224 lb (  101.6 kg)   SpO2 99%   BMI 31.24 kg/m  BP Readings from Last 3 Encounters:  02/28/23 (!) 152/78  12/14/22 (!) 150/73  12/05/22 (!) 143/65   Wt Readings from Last 3 Encounters:  02/28/23 224 lb (101.6 kg)  12/05/22 223 lb (101.2 kg)  11/28/22 231 lb 14.8 oz (105.2 kg)      Physical Exam  Bilateral  feet are cracked in multiple areas on bilateral feet. Two open shallow wounds present, one on each foot at base of toes on plantar surface.   Bilateral hands are erythematous and peeling appearance over palmar  surface.   Rash over the anterior surface of his right shin. Rash is erythematous in a confluent smooth patch but not scaling. Rash does itch but patient denies any pain.   Rash over abdomen has a large thick plaque with thick scales and tiny dime sized patches of scaly erythema over trunk.     The 10-year ASCVD risk score (Arnett DK, et al., 2019) is: 31.2%    Assessment & Plan:  Marland KitchenMarland KitchenEmraan was seen today for rash.  Diagnoses and all orders for this visit:  Tinea pedis of both feet -     fluconazole (DIFLUCAN) 150 MG tablet; Take once a week for 4 weeks. -     ketoconazole (NIZORAL) 2 % cream; Apply 1 Application topically 2 (two) times daily. To affected areas on hands and feet.  Psoriasis -     methylPREDNISolone sodium succinate (SOLU-MEDROL) 125 mg/2 mL injection 125 mg  Essential hypertension  Multiple wounds of skin -     doxycycline (VIBRA-TABS) 100 MG tablet; Take 1 tablet (100 mg total) by mouth 2 (two) times daily.   Suspect 2 different rashes  Fungus and inflammatory  Soaked feet in hibiclens today and dried feet.   Gave ketoconazole cream to apply to the feet and hands Instructed to keep his feet clean and dry and to do self foot exam daily  Take diflucan one time a week for four weeks Apply clobetasol cream to rash on abdomen and upper leg Solumedrol given IM today Sent prescription of doxycycline. Begin taking it if signs of infection begin such as increased redness, warmth, pain, or fever Pt has increased risk of infection due to DM and immunosuppression BP was elevated today and not to goal, will recheck at next visit Goal is under 130/80 Follow up in one week

## 2023-02-28 NOTE — Patient Instructions (Addendum)
Ketoconazole cream for feet Diflucan once a week for 4 weeks Clobetasol cream for abdomen and upper right leg  Doxycycline if any signs of infection  Athlete's Foot Athlete's foot (tinea pedis) is a fungal infection of the skin on your feet. It often occurs on the skin that is between or underneath the toes. It can also occur on the soles of your feet. The infection can spread from person to person (is contagious). It can also spread when a person's bare feet come in contact with the fungus on shower floors or on items such as shoes. What are the causes? This condition is caused by a fungus that grows in warm, moist places. You can get athlete's foot by sharing shoes, shower stalls, towels, and wet floors with someone who is infected. Not washing your feet or changing your socks often enough can also lead to athlete's foot. What increases the risk? This condition is more likely to develop in: Men. People who have a weak body defense system (immune system). People who have diabetes. People who use public showers, such as at a gym. People who wear heavy-duty shoes, such as Youth worker. Seasons with warm, humid weather. What are the signs or symptoms? Symptoms of this condition include: Itchy areas between your toes or on the soles of your feet. White, flaky, or scaly areas between your toes or on the soles of your feet. Very itchy small blisters between your toes or on the soles of your feet. Small cuts in your skin. These cuts can become infected. Thick or discolored toenails. How is this diagnosed? This condition may be diagnosed with a physical exam and a review of your medical history. Your health care provider may also take a skin or toenail sample to examine under a microscope. How is this treated? This condition is treated with antifungal medicines. These may be applied as powders, ointments, or creams. In severe cases, an oral antifungal medicine may be  given. Follow these instructions at home: Medicines Apply or take over-the-counter and prescription medicines only as told by your health care provider. Apply your antifungal medicine as told by your health care provider. Do not stop using the antifungal even if your condition improves. Foot care Do not scratch your feet. Keep your feet dry: Wear cotton or wool socks. Change your socks every day or if they become wet. Wear shoes that allow air to flow, such as sandals or canvas tennis shoes. Wash and dry your feet, including the area between your toes. Also, wash and dry your feet: Every day or as told by your health care provider. After exercising. General instructions Do not let others use towels, shoes, nail clippers, or other personal items that touch your feet. Protect your feet by wearing sandals in wet areas, such as locker rooms and shared showers. Keep all follow-up visits. This is important. If you have diabetes, keep your blood sugar under control. Contact a health care provider if: You have a fever. You have swelling, soreness, warmth, or redness in your foot. Your feet are not getting better with treatment. Your symptoms get worse. You have new symptoms. You have severe pain. Summary Athlete's foot (tinea pedis) is a fungal infection of the skin on your feet. It often occurs on skin that is between or underneath the toes. This condition is caused by a fungus that grows in warm, moist places. Symptoms include white, flaky, or scaly areas between your toes or on the soles of your feet.  This condition is treated with antifungal medicines. Keep your feet clean. Always dry them thoroughly. This information is not intended to replace advice given to you by your health care provider. Make sure you discuss any questions you have with your health care provider. Document Revised: 09/05/2020 Document Reviewed: 09/05/2020 Elsevier Patient Education  2024 [employer].

## 2023-03-02 ENCOUNTER — Ambulatory Visit (INDEPENDENT_AMBULATORY_CARE_PROVIDER_SITE_OTHER): Payer: Commercial Managed Care - PPO | Admitting: Physician Assistant

## 2023-03-02 ENCOUNTER — Encounter: Payer: Self-pay | Admitting: Physician Assistant

## 2023-03-02 VITALS — BP 139/63 | HR 63 | Temp 98.1°F | Ht 71.0 in | Wt 229.0 lb

## 2023-03-02 DIAGNOSIS — M79671 Pain in right foot: Secondary | ICD-10-CM | POA: Diagnosis not present

## 2023-03-02 DIAGNOSIS — M79672 Pain in left foot: Secondary | ICD-10-CM

## 2023-03-02 DIAGNOSIS — R21 Rash and other nonspecific skin eruption: Secondary | ICD-10-CM | POA: Diagnosis not present

## 2023-03-02 DIAGNOSIS — E1129 Type 2 diabetes mellitus with other diabetic kidney complication: Secondary | ICD-10-CM

## 2023-03-02 DIAGNOSIS — L089 Local infection of the skin and subcutaneous tissue, unspecified: Secondary | ICD-10-CM

## 2023-03-02 DIAGNOSIS — L409 Psoriasis, unspecified: Secondary | ICD-10-CM

## 2023-03-02 DIAGNOSIS — R809 Proteinuria, unspecified: Secondary | ICD-10-CM

## 2023-03-02 DIAGNOSIS — Z794 Long term (current) use of insulin: Secondary | ICD-10-CM

## 2023-03-02 MED ORDER — FREESTYLE LIBRE 14 DAY SENSOR MISC
1.0000 | 11 refills | Status: DC
Start: 2023-03-02 — End: 2024-04-18

## 2023-03-02 MED ORDER — HYDROCODONE-ACETAMINOPHEN 5-325 MG PO TABS: 1.0000 | ORAL_TABLET | Freq: Four times a day (QID) | ORAL | 0 refills | Status: AC | PRN

## 2023-03-02 NOTE — Patient Instructions (Signed)
Start doxycycline Will let rheumatology determine prednisone dosing Continue diflucan weekly Norco for break through pain Check sugars with Josephine Igo

## 2023-03-02 NOTE — Progress Notes (Signed)
Acute Office Visit  Subjective:     Patient ID: Robert Frey, male    DOB: 1960-10-01, 62 y.o.   MRN: [id number]  Chief Complaint  Patient presents with   Rash    HPI Patient is in today for follow up on skin rashes and itching. He is accompanied by his wife.   His rash on abdomen has improved a little and rash seems to have improved some on trunk after solumedrol shot 2 days ago.  He admits now that he did not get his last cosentyx shot for psoriasis.   The redness of his feet has improved but they are very scaly and now they hurt more. He would like something for pain. He has a few more open areas of skin that appear more like an ulcer forming.   .. Active Ambulatory Problems    Diagnosis Date Noted   HYPERCHOLESTEROLEMIA 03/06/2006   OBESITY, NOS 03/06/2006   OBSESSIVE COMPUL. DISORDER 03/06/2006   Ankylosing spondylitis of multiple sites in spine ([name]) 09/30/2009   Psoriatic arthritis ([name]) 10/11/2012   Type 2 diabetes mellitus with microalbuminuria, with long-term current use of insulin ([name]) 10/11/2012   Heel spur 01/20/2014   Osteoarthritis of foot, left 01/20/2014   Lung nodule < 6cm on CT 05/15/2014   Essential hypertension 06/08/2015   Microalbuminuria 06/08/2015   Bruising 06/09/2015   Lumbosacral strain 01/17/2017   Dark urine 01/01/2019   Shakes 04/04/2019   Essential tremor 04/16/2019   Shoulder separation, right, subsequent encounter 06/17/2019   Primary osteoarthritis, right glenohumeral joint 07/02/2019   Current smoker 10/22/2019   Nosebleed 10/22/2019   Diabetic neuropathy ([name]) 01/28/2020   Family history of Alzheimer's disease 11/09/2020   Rib pain on left side 03/07/2021   Fall 03/07/2021   Chronic foot pain, right 05/03/2021   Acute left-sided low back pain without sciatica 06/07/2021   Diabetic foot infection ([name]) 06/28/2021   Psoriasis 06/28/2021   Absent pedal pulses 07/13/2021   Acute right ankle pain 07/13/2021   Family history of  blood clots 07/26/2021   Leg edema, right 07/26/2021   Right leg pain 07/26/2021   Syndesmotic disruption of right ankle 08/01/2021   Charcot's joint of right foot 08/03/2021   Dyslipidemia, goal LDL below 70 01/17/2022   Heat exhaustion 12/05/2022   Dehydration 12/05/2022   Hyponatremia 12/05/2022   Tinea pedis of both feet 02/28/2023   Resolved Ambulatory Problems    Diagnosis Date Noted   KNEE PAIN, BILATERAL 12/28/2006   ELEVATED BLOOD PRESSURE WITHOUT DIAGNOSIS OF HYPERTENSION 08/29/2006   Cough 11/25/2010   Past Medical History:  Diagnosis Date   Ankylosing spondylitis ([name])    COPD (chronic obstructive pulmonary disease) ([name])    Diabetes mellitus without complication ([name])    High cholesterol    Hypertension    PTSD (post-traumatic stress disorder)      ROS See HPI.      Objective:    BP 139/63   Pulse 63   Temp 98.1 F (36.7 C) (Oral)   Ht 5\' 11"  (1.803 m)   Wt 229 lb (103.9 kg)   SpO2 98%   BMI 31.94 kg/m  BP Readings from Last 3 Encounters:  03/02/23 139/63  02/28/23 (!) 152/78  12/14/22 (!) 150/73   Wt Readings from Last 3 Encounters:  03/02/23 229 lb (103.9 kg)  02/28/23 224 lb (101.6 kg)  12/05/22 223 lb (101.2 kg)      Physical Exam Rash on abdomen is a slightly faded  erythematous scaly patch with scattered guttate plaques surrounding abdomen  Bilateral feet are erythematous with scales and cracks in skin with a few areas around great toe of ulceration.      Assessment & Plan:  Marland KitchenMarland KitchenJakson was seen today for rash.  Diagnoses and all orders for this visit:  Psoriasis  Bilateral foot pain -     HYDROcodone-acetaminophen (NORCO/VICODIN) 5-325 MG tablet; Take 1 tablet by mouth every 6 (six) hours as needed for up to 5 days for moderate pain.  Skin infection -     HYDROcodone-acetaminophen (NORCO/VICODIN) 5-325 MG tablet; Take 1 tablet by mouth every 6 (six) hours as needed for up to 5 days for moderate pain.  Rash  Type 2 diabetes  mellitus with microalbuminuria, with long-term current use of insulin ([name]) -     Continuous Glucose Sensor (FREESTYLE LIBRE 14 DAY SENSOR) MISC; 1 Application by Does not apply route every 14 (fourteen) days. Apply upper deltoid every 14 days, use reader to determine blood sugars   Suspect psorasis flare due to not getting shot but still considering that there could be some fungus on the feet Continue diflucan Wait on rheumatology appt today to determine steroid dose Strongly urged him to use libre, CGM to manage his sugars while on prednisone I would like pt to start doxycyline as I am concerned about some of the wounds on feet turning into infection especially with him complaining more of pain than itching now Norco for break through pain given Follow up in 1 week    Tandy Gaw, PA-C

## 2023-03-07 ENCOUNTER — Ambulatory Visit (INDEPENDENT_AMBULATORY_CARE_PROVIDER_SITE_OTHER): Payer: Commercial Managed Care - PPO | Admitting: Physician Assistant

## 2023-03-07 ENCOUNTER — Telehealth: Payer: Self-pay

## 2023-03-07 ENCOUNTER — Encounter: Payer: Self-pay | Admitting: Physician Assistant

## 2023-03-07 VITALS — BP 141/74 | HR 100 | Ht 71.0 in | Wt 224.0 lb

## 2023-03-07 DIAGNOSIS — R21 Rash and other nonspecific skin eruption: Secondary | ICD-10-CM

## 2023-03-07 DIAGNOSIS — S90821A Blister (nonthermal), right foot, initial encounter: Secondary | ICD-10-CM

## 2023-03-07 DIAGNOSIS — E11621 Type 2 diabetes mellitus with foot ulcer: Secondary | ICD-10-CM

## 2023-03-07 DIAGNOSIS — L089 Local infection of the skin and subcutaneous tissue, unspecified: Secondary | ICD-10-CM

## 2023-03-07 DIAGNOSIS — L409 Psoriasis, unspecified: Secondary | ICD-10-CM | POA: Diagnosis not present

## 2023-03-07 DIAGNOSIS — M79671 Pain in right foot: Secondary | ICD-10-CM

## 2023-03-07 DIAGNOSIS — M79672 Pain in left foot: Secondary | ICD-10-CM

## 2023-03-07 DIAGNOSIS — L97511 Non-pressure chronic ulcer of other part of right foot limited to breakdown of skin: Secondary | ICD-10-CM | POA: Diagnosis not present

## 2023-03-07 DIAGNOSIS — F17200 Nicotine dependence, unspecified, uncomplicated: Secondary | ICD-10-CM

## 2023-03-07 DIAGNOSIS — Z7985 Long-term (current) use of injectable non-insulin antidiabetic drugs: Secondary | ICD-10-CM

## 2023-03-07 NOTE — Patient Instructions (Addendum)
Continue to use xeroform gauze dressing on ulcer of great toe Wear post op boot on right foot Will make referral to podiatry Stay off feet for next 48 hours

## 2023-03-07 NOTE — Progress Notes (Unsigned)
Established Patient Office Visit  Subjective   Patient ID: Robert Frey, male    DOB: Dec 06, 1960  Age: 62 y.o. MRN: [id number]  No chief complaint on file.   HPI Pt is a 62 yo obese male with T2DMs and psorasis who is in a psoriasis flare. He missed a shot. His rheumatology has now changed up medication to cosentyx. He is using clobeasol and ketoconazole on his feet. He is taking his doxycycline. He kicked the wall and opened up to areas on the dorsal right toes that are draining and he is worried about. His feet are very painful. He did not like taking norco because it "put him to sleep" wonders if he could try tramadol. His sugars have been running 125's in the morning and 130s to 150s throughout the day.   Patient Active Problem List   Diagnosis Date Noted   Diabetic ulcer of toe of right foot associated with type 2 diabetes mellitus, limited to breakdown of skin ([name]) 03/08/2023   Blister of plantar aspect of right foot 03/08/2023   Tinea pedis of both feet 02/28/2023   Heat exhaustion 12/05/2022   Dehydration 12/05/2022   Hyponatremia 12/05/2022   Dyslipidemia, goal LDL below 70 01/17/2022   Charcot's joint of right foot 08/03/2021   Syndesmotic disruption of right ankle 08/01/2021   Family history of blood clots 07/26/2021   Leg edema, right 07/26/2021   Right leg pain 07/26/2021   Absent pedal pulses 07/13/2021   Acute right ankle pain 07/13/2021   Diabetic foot infection ([name]) 06/28/2021   Psoriasis 06/28/2021   Acute left-sided low back pain without sciatica 06/07/2021   Chronic foot pain, right 05/03/2021   Rib pain on left side 03/07/2021   Fall 03/07/2021   Family history of Alzheimer's disease 11/09/2020   Diabetic neuropathy ([name]) 01/28/2020   Current smoker 10/22/2019   Nosebleed 10/22/2019   Primary osteoarthritis, right glenohumeral joint 07/02/2019   Shoulder separation, right, subsequent encounter 06/17/2019   Essential tremor 04/16/2019   Shakes  04/04/2019   Dark urine 01/01/2019   Lumbosacral strain 01/17/2017   Bruising 06/09/2015   Essential hypertension 06/08/2015   Microalbuminuria 06/08/2015   Lung nodule < 6cm on CT 05/15/2014   Heel spur 01/20/2014   Osteoarthritis of foot, left 01/20/2014   Psoriatic arthritis ([name]) 10/11/2012   Type 2 diabetes mellitus with microalbuminuria, with long-term current use of insulin ([name]) 10/11/2012   Ankylosing spondylitis of multiple sites in spine ([name]) 09/30/2009   HYPERCHOLESTEROLEMIA 03/06/2006   OBESITY, NOS 03/06/2006   OBSESSIVE COMPUL. DISORDER 03/06/2006   Past Medical History:  Diagnosis Date   Ankylosing spondylitis ([name])    COPD (chronic obstructive pulmonary disease) ([name])    Diabetes mellitus without complication ([name])    High cholesterol    Hypertension    PTSD (post-traumatic stress disorder)    Past Surgical History:  Procedure Laterality Date   APPENDECTOMY     CARPAL TUNNEL RELEASE Right    ent surgery     ORIF CALCANEOUS FRACTURE Right 08/01/2021   Procedure: OPEN REDUCTION INTERNAL FIXATION (ORIF) FOOT FRACTURE;  Surgeon: Myrene Galas, MD;  Location: [facility] OR;  Service: Orthopedics;  Laterality: Right;   TONSILLECTOMY AND ADENOIDECTOMY     Family History  Problem Relation Age of Onset   Stroke Other    Alcohol abuse Other    Depression Other    Hypertension Other    Hyperlipidemia Other    Diabetes Mother    Alzheimer's disease  Mother    Diabetes Sister    Stroke Sister    Allergies  Allergen Reactions   Green Dyes Hives   Propranolol     Nausea/vomiting   Latex Rash    Powder in gloves   Zithromax [Azithromycin Dihydrate] Rash      ROS See HPI.    Objective:     BP (!) 141/74   Pulse 100   Ht 5\' 11"  (1.803 m)   Wt 224 lb (101.6 kg)   SpO2 99%   BMI 31.24 kg/m  BP Readings from Last 3 Encounters:  03/07/23 (!) 141/74  03/02/23 139/63  02/28/23 (!) 152/78   Wt Readings from Last 3 Encounters:  03/07/23 224 lb (101.6 kg)   03/02/23 229 lb (103.9 kg)  02/28/23 224 lb (101.6 kg)      Physical Exam Bilateral feet erythematous and scaly with evidence of deep cracks on the plantar surface.  Right plantar foot has a closed flucuant blister forming in the middle of his arch Right great dorsal toe a 1cm by 1cm by 1mm ulcer forming  Right 2nd forsal toe 5mm by 5mm by 1mm ulcer forming    The 10-year ASCVD risk score (Arnett DK, et al., 2019) is: 27.8%    Assessment & Plan:  Marland KitchenMarland KitchenDiagnoses and all orders for this visit:  Diabetic ulcer of toe of right foot associated with type 2 diabetes mellitus, limited to breakdown of skin ([name]) -     Ambulatory referral to Podiatry  Psoriasis  Bilateral foot pain -     Ambulatory referral to Podiatry -     traMADol (ULTRAM) 50 MG tablet; Take 1 tablet (50 mg total) by mouth every 6 (six) hours as needed for up to 5 days.  Skin infection  Rash -     Ambulatory referral to Podiatry  Blister of plantar aspect of right foot, initial encounter -     Ambulatory referral to Podiatry  Current smoker   Erythema and scales are getting some better but the foot cracks and 2 new ulcer formations since last visit can continue to use clobetasol on scaly areas but avoid ulcers and cracks Will refer to podiatry Cleaned feet today with hibiclens Debridement of 2 ulcers and placed xeroform on both toes with non-stick bandage and had patient change dressing twice a day Use aquafor/vasoline for foot and hand moisture Wear post op shoe for right foot to take pressure off blister forming on plantar foot Written out of work for 48 hours Will fill out [employer] for intermittent leave Finish doxycycline Follow up in 1 week or as needed     Return in about 1 week (around 03/14/2023).    Tandy Gaw, PA-C

## 2023-03-07 NOTE — Telephone Encounter (Signed)
Pt states he requsting flma paperwork, pt states he will get a [place] a copy from his job

## 2023-03-08 ENCOUNTER — Other Ambulatory Visit: Payer: Self-pay | Admitting: Physician Assistant

## 2023-03-08 DIAGNOSIS — R809 Proteinuria, unspecified: Secondary | ICD-10-CM

## 2023-03-08 DIAGNOSIS — E11621 Type 2 diabetes mellitus with foot ulcer: Secondary | ICD-10-CM | POA: Insufficient documentation

## 2023-03-08 DIAGNOSIS — S90821A Blister (nonthermal), right foot, initial encounter: Secondary | ICD-10-CM | POA: Insufficient documentation

## 2023-03-08 MED ORDER — TRAMADOL HCL 50 MG PO TABS
50.0000 mg | ORAL_TABLET | Freq: Four times a day (QID) | ORAL | 0 refills | Status: AC | PRN
Start: 2023-03-08 — End: 2023-03-13

## 2023-03-14 ENCOUNTER — Ambulatory Visit: Payer: Commercial Managed Care - PPO | Admitting: Physician Assistant

## 2023-03-14 ENCOUNTER — Encounter: Payer: Self-pay | Admitting: Physician Assistant

## 2023-03-14 ENCOUNTER — Telehealth: Payer: Commercial Managed Care - PPO | Admitting: Physician Assistant

## 2023-03-14 DIAGNOSIS — L97511 Non-pressure chronic ulcer of other part of right foot limited to breakdown of skin: Secondary | ICD-10-CM

## 2023-03-14 DIAGNOSIS — M79672 Pain in left foot: Secondary | ICD-10-CM

## 2023-03-14 DIAGNOSIS — Z794 Long term (current) use of insulin: Secondary | ICD-10-CM

## 2023-03-14 DIAGNOSIS — S90821A Blister (nonthermal), right foot, initial encounter: Secondary | ICD-10-CM

## 2023-03-14 DIAGNOSIS — R809 Proteinuria, unspecified: Secondary | ICD-10-CM | POA: Diagnosis not present

## 2023-03-14 DIAGNOSIS — E1129 Type 2 diabetes mellitus with other diabetic kidney complication: Secondary | ICD-10-CM

## 2023-03-14 DIAGNOSIS — L409 Psoriasis, unspecified: Secondary | ICD-10-CM

## 2023-03-14 DIAGNOSIS — E11621 Type 2 diabetes mellitus with foot ulcer: Secondary | ICD-10-CM

## 2023-03-14 DIAGNOSIS — M79671 Pain in right foot: Secondary | ICD-10-CM

## 2023-03-14 NOTE — Progress Notes (Signed)
..Virtual Visit via Video Note  I connected with Robert Frey on 03/14/23 at  9:10 AM EDT by a video enabled telemedicine application and verified that I am speaking with the correct person using two identifiers.  Location: Patient: work Provider: clinic  .Marland KitchenParticipating in visit:  Patient: Robert Frey Provider: Tandy Gaw PA-C   I discussed the limitations of evaluation and management by telemedicine and the availability of in person appointments. The patient expressed understanding and agreed to proceed.  History of Present Illness: Pt is a 62 yo obese male with T2DM, Psoriasis, HTN who presents to the clinic to follow up on bilateral feet pain/infection/blisters for last 6 weeks. He finished his doxycycline yesterday. He continues to place xeroform over blisters and wear post op shoe/boot on right foot. He is trying to stay off his feet. He reports feet being some better but still painful. He has podiatry appt for tomorrow.   He did start back on injections for psoriasis.   Active Ambulatory Problems    Diagnosis Date Noted   HYPERCHOLESTEROLEMIA 03/06/2006   OBESITY, NOS 03/06/2006   OBSESSIVE COMPUL. DISORDER 03/06/2006   Ankylosing spondylitis of multiple sites in spine ([name]) 09/30/2009   Psoriatic arthritis ([name]) 10/11/2012   Type 2 diabetes mellitus with microalbuminuria, with long-term current use of insulin ([name]) 10/11/2012   Heel spur 01/20/2014   Osteoarthritis of foot, left 01/20/2014   Lung nodule < 6cm on CT 05/15/2014   Essential hypertension 06/08/2015   Microalbuminuria 06/08/2015   Bruising 06/09/2015   Lumbosacral strain 01/17/2017   Dark urine 01/01/2019   Shakes 04/04/2019   Essential tremor 04/16/2019   Shoulder separation, right, subsequent encounter 06/17/2019   Primary osteoarthritis, right glenohumeral joint 07/02/2019   Current smoker 10/22/2019   Nosebleed 10/22/2019   Diabetic neuropathy ([name]) 01/28/2020   Family history of Alzheimer's disease  11/09/2020   Rib pain on left side 03/07/2021   Fall 03/07/2021   Chronic foot pain, right 05/03/2021   Acute left-sided low back pain without sciatica 06/07/2021   Diabetic foot infection ([name]) 06/28/2021   Psoriasis 06/28/2021   Absent pedal pulses 07/13/2021   Acute right ankle pain 07/13/2021   Family history of blood clots 07/26/2021   Leg edema, right 07/26/2021   Right leg pain 07/26/2021   Syndesmotic disruption of right ankle 08/01/2021   Charcot's joint of right foot 08/03/2021   Dyslipidemia, goal LDL below 70 01/17/2022   Heat exhaustion 12/05/2022   Dehydration 12/05/2022   Hyponatremia 12/05/2022   Tinea pedis of both feet 02/28/2023   Diabetic ulcer of toe of right foot associated with type 2 diabetes mellitus, limited to breakdown of skin ([name]) 03/08/2023   Blister of plantar aspect of right foot 03/08/2023   Resolved Ambulatory Problems    Diagnosis Date Noted   KNEE PAIN, BILATERAL 12/28/2006   ELEVATED BLOOD PRESSURE WITHOUT DIAGNOSIS OF HYPERTENSION 08/29/2006   Cough 11/25/2010   Past Medical History:  Diagnosis Date   Ankylosing spondylitis ([name])    COPD (chronic obstructive pulmonary disease) ([name])    Diabetes mellitus without complication ([name])    High cholesterol    Hypertension    PTSD (post-traumatic stress disorder)     Observations/Objective: No acute distress Not able to get a clear view of his feet virtually to describe.  Overall appear dry and cracked but no overt erythema. Not able to characterize the ulcers and blisters.    Assessment and Plan: Marland KitchenMarland KitchenDiagnoses and all orders for this visit:  Diabetic  ulcer of toe of right foot associated with type 2 diabetes mellitus, limited to breakdown of skin ([name])  Psoriasis  Bilateral foot pain  Blister of plantar aspect of right foot, initial encounter  Type 2 diabetes mellitus with microalbuminuria, with long-term current use of insulin ([name])   Did not make any changes to the plan  today He has podiatry appt tomorrow Continue to keep good glucose control as it is very important for wounds to heal   Follow Up Instructions:    I discussed the assessment and treatment plan with the patient. The patient was provided an opportunity to ask questions and all were answered. The patient agreed with the plan and demonstrated an understanding of the instructions.   The patient was advised to call back or seek an in-person evaluation if the symptoms worsen or if the condition fails to improve as anticipated.   Tandy Gaw, PA-C

## 2023-03-15 ENCOUNTER — Ambulatory Visit: Payer: Commercial Managed Care - PPO | Admitting: Podiatry

## 2023-03-15 ENCOUNTER — Encounter: Payer: Self-pay | Admitting: Podiatry

## 2023-03-15 DIAGNOSIS — E11621 Type 2 diabetes mellitus with foot ulcer: Secondary | ICD-10-CM | POA: Diagnosis not present

## 2023-03-15 DIAGNOSIS — B351 Tinea unguium: Secondary | ICD-10-CM

## 2023-03-15 DIAGNOSIS — M79674 Pain in right toe(s): Secondary | ICD-10-CM | POA: Diagnosis not present

## 2023-03-15 DIAGNOSIS — M79675 Pain in left toe(s): Secondary | ICD-10-CM

## 2023-03-15 DIAGNOSIS — E1142 Type 2 diabetes mellitus with diabetic polyneuropathy: Secondary | ICD-10-CM | POA: Diagnosis not present

## 2023-03-15 DIAGNOSIS — L409 Psoriasis, unspecified: Secondary | ICD-10-CM

## 2023-03-15 DIAGNOSIS — L97511 Non-pressure chronic ulcer of other part of right foot limited to breakdown of skin: Secondary | ICD-10-CM | POA: Diagnosis not present

## 2023-03-15 MED ORDER — AMMONIUM LACTATE 12 % EX CREA: 1.0000 | TOPICAL_CREAM | CUTANEOUS | 0 refills | Status: AC | PRN

## 2023-03-15 NOTE — Progress Notes (Signed)
Subjective:  Patient ID: Robert Frey, male    DOB: 10/19/1960,   MRN: [id number]  No chief complaint on file.   62 y.o. male presents for concern of wound on the left foot. Patient has a history of psoriasis an dhad been treating this. Has been seeing Tandy Gaw and treated fro blisters on his feet for last 6 weeks with xeroform and doxycycline. Relates doing better but still painful. Relates burning and tingling in their feet. Patient is diabetic and last A1c was  Lab Results  Component Value Date   HGBA1C 6.7 (A) 12/05/2022   .   PCP:  Jomarie Longs, PA-C    . Denies any other pedal complaints. Denies n/v/f/c.   Past Medical History:  Diagnosis Date   Ankylosing spondylitis ([name])    COPD (chronic obstructive pulmonary disease) ([name])    Diabetes mellitus without complication ([name])    High cholesterol    Hypertension    PTSD (post-traumatic stress disorder)     Objective:  Physical Exam: Vascular: DP/PT pulses 2/4 bilateral. CFT <3 seconds. Absent hair growth on digits. Edema noted to bilateral lower extremities. Xerosis noted bilaterally.  Skin. No lacerations or abrasions bilateral feet. Nails 1-5 bilateral  are thickened discolored and elongated with subungual debris. Multiple fissures noted to bilateral plantar feet. One in particular on the left nearly open. Ulceration noted to dorsum of right hallux and second digit each about 1 cm in diameter bigger on hallux. Fibrinous base. No erythema edema or purulence noted. No probe to bone Musculoskeletal: MMT 5/5 bilateral lower extremities in DF, PF, Inversion and Eversion. Deceased ROM in DF of ankle joint.  Neurological: Sensation intact to light touch. Protective sensation diminished bilateral.    Assessment:   1. Diabetic polyneuropathy associated with type 2 diabetes mellitus ([name])   2. Pain due to onychomycosis of toenails of both feet      Plan:  Patient was evaluated and treated and all questions  answered. Ulcer right hallux and second digit limited to breakdown of skin  -Debridement as below. -Dressed with betadine, DSD. -continue Xeroform to cracked areas.  -Off-loading with surgical shoe. -Ammonium lactate prescribed for fissures. Alternate wit aquaphor twice daily  -Continue psoriasis injections.  -No abx indicated.  -Discussed glucose control and proper protein-rich diet.  -Discussed if any worsening redness, pain, fever or chills to call or may need to report to the emergency room. Patient expressed understanding.   Procedure: Excisional Debridement of Wound Rationale: Removal of non-viable soft tissue from the wound to promote healing.  Anesthesia: none Pre-Debridement Wound Measurements: overlying slough   Post-Debridement Wound Measurements: 1 cm x 0.8 cm x 0.1 cm (right hallux) 0.7 cm x 0.5 cm x 0.1 cm (right second toe)  Type of Debridement: Sharp Excisional Tissue Removed: Non-viable soft tissue Depth of Debridement: subcutaneous tissue. Technique: Sharp excisional debridement to bleeding, viable wound base.  Dressing: Dry, sterile, compression dressing. Disposition: Patient tolerated procedure well. Patient to return in 2 week for follow-up.  No follow-ups on file.    Louann Sjogren, DPM

## 2023-03-16 ENCOUNTER — Telehealth: Payer: Self-pay | Admitting: Physician Assistant

## 2023-03-16 NOTE — Telephone Encounter (Signed)
Patient has Psoriasis and he says its spreading he is requesting blood work to see what's going on

## 2023-03-18 ENCOUNTER — Encounter: Payer: Self-pay | Admitting: Podiatry

## 2023-03-20 ENCOUNTER — Ambulatory Visit (INDEPENDENT_AMBULATORY_CARE_PROVIDER_SITE_OTHER): Payer: Commercial Managed Care - PPO | Admitting: Behavioral Health

## 2023-03-20 ENCOUNTER — Encounter: Payer: Self-pay | Admitting: Behavioral Health

## 2023-03-20 DIAGNOSIS — F4310 Post-traumatic stress disorder, unspecified: Secondary | ICD-10-CM

## 2023-03-20 DIAGNOSIS — F411 Generalized anxiety disorder: Secondary | ICD-10-CM

## 2023-03-20 DIAGNOSIS — F331 Major depressive disorder, recurrent, moderate: Secondary | ICD-10-CM

## 2023-03-20 NOTE — Progress Notes (Unsigned)
[facility] Counselor/Therapist Progress Note  Patient ID: Robert Frey, MRN: [id number],    Date: 03/20/2023  Time Spent: 8;00 AM to 8:56 a.m.,   56 minutes spent in person with the patient in outpatient therapy office.    Treatment Type: Individual Therapy  Reported Symptoms: Generalized anxiety disorder/stress, patient having repeated weird and/or bad dreams related to his past.  Mental Status Exam: Appearance:  Fairly Groomed     Behavior: Appropriate  Motor: Normal  Speech/Language:  Clear and Coherent  Affect: Appropriate  Mood: sad  Thought process: normal  Thought content:   WNL  Sensory/Perceptual disturbances:   WNL  Orientation: oriented to person, place, time/date, situation, day of week, and month of year  Attention: Good  Concentration: Good  Memory: WNL  Fund of knowledge:  Good  Insight:   Good  Judgment:  Good  Impulse Control: Good   Risk Assessment: Danger to Self:  No Self-injurious Behavior: No Danger to Others: No Duty to Warn:no Physical Aggression / Violence:No  Access to Firearms a concern: No  Gang Involvement:No   Subjective: It has been a difficult week for the patient.  He was sitting in a chair on his front porch and has started to collapse.  He did get up but then lost his balance and fell face down down the stairs onto concrete's going up the top of his head his side of his face his nose.  He said he is like he did not get himself out.  He was at home himself let down.  The scars are healing but it still pretty painful.  Worse and that he has had a flareup all over his body by psoriasis.  He says that he missed a dose of any thinks that is why it was so bad but is all over his abdomen area.  It was worse on his hands and his feet.  He has gotten that medication is taking something topically also and is getting better but has been both itchy and painful.  It is made sitting laying standing painful and at times sleep  difficult.  He had a dream last night that  The patient does contract for safety having no thoughts of hurting himself or anyone else.  Interventions: Cognitive Behavioral Therapy and Dialectical Behavioral Therapy  Diagnosis:PTSD  Plan: I will meet with the patient every 2 to 3 weeks in person. Treatment plan: We will use cognitive behavioral therapy principles as well as elements of dialectical behavior therapy to reduce depression and anxiety at least 50% over the next 6 months.  Target date will be March 29, 2023.  Goals for depression are to have less sadness as indicated by patient report and PH-9 score, to have improved mood and return to a healthier level of functioning and to identify historical or current causes for depressed mood and learn how to cope with the depression.  Interventions will be to use CBT to explore and replace unhealthy thoughts and behavior patterns and encouraged sharing of feelings related to causes of symptoms of depression including dreams and history, teach and encouraged the use of coping skills for managing depressive symptoms.  Goals for reducing anxiety/stress are to improve his ability to manage anxiety symptoms and better handle stress, identify causes for anxiety and stress to manage thoughts and worrisome thinking contributing to feelings of anxiety.  We will facilitate solution skills for helping identify stressors, teach coping skills for reducing anxiety and  stress and use cognitive behavioral therapy to identify and change anxiety producing thoughts and behavior patterns.  Lastly we will use DBT distress tolerance and mindfulness skills [name], [facility]                                                             [name], [facility]               [name], [facility]               [name], [facility]               [name],  [facility]          [name], [facility]               [name], [facility]

## 2023-03-20 NOTE — Telephone Encounter (Signed)
Pt should follow up with worsening psoriasis with his rheumatologist so they can make adjustments.

## 2023-03-21 ENCOUNTER — Encounter: Payer: Self-pay | Admitting: Physician Assistant

## 2023-03-29 ENCOUNTER — Encounter: Payer: Self-pay | Admitting: Podiatry

## 2023-03-29 ENCOUNTER — Ambulatory Visit (INDEPENDENT_AMBULATORY_CARE_PROVIDER_SITE_OTHER): Payer: Commercial Managed Care - PPO | Admitting: Podiatry

## 2023-03-29 DIAGNOSIS — E11621 Type 2 diabetes mellitus with foot ulcer: Secondary | ICD-10-CM | POA: Diagnosis not present

## 2023-03-29 DIAGNOSIS — L97511 Non-pressure chronic ulcer of other part of right foot limited to breakdown of skin: Secondary | ICD-10-CM | POA: Diagnosis not present

## 2023-03-29 DIAGNOSIS — L409 Psoriasis, unspecified: Secondary | ICD-10-CM

## 2023-03-29 NOTE — Progress Notes (Signed)
Subjective:  Patient ID: Robert Frey, male    DOB: March 13, 1961,   MRN: [id number]  Chief Complaint  Patient presents with   Wound Check    DIABETIC, BILAT, RIGHT IS OPEN HAVING PAIN AND DRAINAGE, DENIES N/V/F/C/SOB    62 y.o. male presents for  follow-up of left foot wound. Relates has been dressing as instructed. The toe is about the same but the fissures on the bottom of the foot have been improving.  Patient has a history of psoriasis an dhad been treating this.  Relates doing better but still painful. Relates burning and tingling in their feet. Patient is diabetic and last A1c was  Lab Results  Component Value Date   HGBA1C 6.7 (A) 12/05/2022   .   PCP:  Jomarie Longs, PA-C    . Denies any other pedal complaints. Denies n/v/f/c.   Past Medical History:  Diagnosis Date   Ankylosing spondylitis ([name])    COPD (chronic obstructive pulmonary disease) ([name])    Diabetes mellitus without complication ([name])    High cholesterol    Hypertension    PTSD (post-traumatic stress disorder)     Objective:  Physical Exam: Vascular: DP/PT pulses 2/4 bilateral. CFT <3 seconds. Absent hair growth on digits. Edema noted to bilateral lower extremities. Xerosis noted bilaterally.  Skin. No lacerations or abrasions bilateral feet. Nails 1-5 bilateral  are thickened discolored and elongated with subungual debris. Multiple fissures noted to bilateral plantar feet. One in particular on the left nearly open. Ulceration noted to dorsum of right hallux about 1 cm in diameter. The right second digit ulceration has healed.  Musculoskeletal: MMT 5/5 bilateral lower extremities in DF, PF, Inversion and Eversion. Deceased ROM in DF of ankle joint.  Neurological: Sensation intact to light touch. Protective sensation diminished bilateral.    Assessment:   1. Diabetic ulcer of toe of right foot associated with type 2 diabetes mellitus, limited to breakdown of skin ([name])   2. Psoriasis       Plan:   Patient was evaluated and treated and all questions answered. Ulcer right hallux and second digit limited to breakdown of skin  -Debridement as below. -Dressed with betadine, DSD. -continue Xeroform to cracked areas.  -Off-loading with surgical shoe. -Ammonium lactate prescribed for fissures. Alternate wit aquaphor twice daily  -Continue psoriasis injections.  -No abx indicated.  -Discussed glucose control and proper protein-rich diet.  -Discussed if any worsening redness, pain, fever or chills to call or may need to report to the emergency room. Patient expressed understanding.   Procedure: Excisional Debridement of Wound Rationale: Removal of non-viable soft tissue from the wound to promote healing.  Anesthesia: none Pre-Debridement Wound Measurements: overlying slough   Post-Debridement Wound Measurements: 0.9 cm x 0.8 cm x 0.1 cm (right hallux) Type of Debridement: Sharp Excisional Tissue Removed: Non-viable soft tissue Depth of Debridement: subcutaneous tissue. Technique: Sharp excisional debridement to bleeding, viable wound base.  Dressing: Dry, sterile, compression dressing. Disposition: Patient tolerated procedure well. Patient to return in 2 week for follow-up.  Return in about 2 weeks (around 04/12/2023) for wound check.    Louann Sjogren, DPM

## 2023-04-12 ENCOUNTER — Other Ambulatory Visit: Payer: Self-pay

## 2023-04-12 ENCOUNTER — Ambulatory Visit: Payer: Commercial Managed Care - PPO

## 2023-04-12 ENCOUNTER — Encounter: Payer: Self-pay | Admitting: Podiatry

## 2023-04-12 ENCOUNTER — Ambulatory Visit (INDEPENDENT_AMBULATORY_CARE_PROVIDER_SITE_OTHER): Payer: Commercial Managed Care - PPO | Admitting: Podiatry

## 2023-04-12 DIAGNOSIS — L97512 Non-pressure chronic ulcer of other part of right foot with fat layer exposed: Secondary | ICD-10-CM

## 2023-04-12 DIAGNOSIS — E1142 Type 2 diabetes mellitus with diabetic polyneuropathy: Secondary | ICD-10-CM

## 2023-04-12 DIAGNOSIS — L409 Psoriasis, unspecified: Secondary | ICD-10-CM

## 2023-04-12 DIAGNOSIS — R600 Localized edema: Secondary | ICD-10-CM

## 2023-04-12 DIAGNOSIS — E11621 Type 2 diabetes mellitus with foot ulcer: Secondary | ICD-10-CM

## 2023-04-12 DIAGNOSIS — M79671 Pain in right foot: Secondary | ICD-10-CM

## 2023-04-12 MED ORDER — GABAPENTIN 300 MG PO CAPS: 300.0000 mg | ORAL_CAPSULE | Freq: Two times a day (BID) | ORAL | 3 refills | Status: DC

## 2023-04-12 NOTE — Progress Notes (Signed)
Subjective:  Patient ID: Robert Frey, male    DOB: [birth year],   MRN: [id number]  No chief complaint on file.   62 y.o. male presents for  follow-up of left foot wound. Relates has been dressing as instructed. The toe is about the same but the fissures on the bottom of the foot have been improving on the right but worse on the left. Relates a spot on the outside of the left they have been worsening.  Patient has a history of psoriasis an dhad been treating this.  Relates doing better but still painful. Relates burning and tingling in their feet. Patient is diabetic and last A1c was  Lab Results  Component Value Date   HGBA1C 6.7 (A) 12/05/2022   .   PCP:  Jomarie Longs, PA-C    . Denies any other pedal complaints. Denies n/v/f/c.   Past Medical History:  Diagnosis Date   Ankylosing spondylitis ([name])    COPD (chronic obstructive pulmonary disease) ([name])    Diabetes mellitus without complication ([name])    High cholesterol    Hypertension    PTSD (post-traumatic stress disorder)     Objective:  Physical Exam: Vascular: DP/PT pulses 2/4 bilateral. CFT <3 seconds. Absent hair growth on digits. Edema noted to bilateral lower extremities. Xerosis noted bilaterally.  Skin. No lacerations or abrasions bilateral feet. Nails 1-5 bilateral  are thickened discolored and elongated with subungual debris. Multiple fissures noted to bilateral plantar feet. One in particular on the left nearly open. Ulceration noted to dorsum of right hallux about 1 cm in diameter has gotten deeper but does not probe to bone.  The right second digit ulceration has healed. Mores fissuring noted on the left foot today.  Musculoskeletal: MMT 5/5 bilateral lower extremities in DF, PF, Inversion and Eversion. Deceased ROM in DF of ankle joint. Hallux malleolus noted bilateral Neurological: Sensation intact to light touch. Protective sensation diminished bilateral.    Assessment:   1. Diabetic ulcer of toe of right  foot associated with type 2 diabetes mellitus, with fat layer exposed ([name])   2. Diabetic polyneuropathy associated with type 2 diabetes mellitus ([name])   3. Psoriasis        Plan:  Patient was evaluated and treated and all questions answered. Ulcer right hallux and second digit limited to breakdown of skin  -X-ray negative for any osseous erosions. Hallux malleolus noted bilateral.  -Debridement as below. -Dressed with betadine, DSD. -continue Xeroform to cracked areas.  -Off-loading with surgical shoe. -Ammonium lactate prescribed for fissures. Alternate wit aquaphor twice daily  -Continue psoriasis injections.  -Patient requesting switch from lyrica to gabapentin. Lyrica no longer helping. Gabapentin sent to pharmacy will start with 300 mg twice daily.  -No abx indicated.  -Discussed glucose control and proper protein-rich diet.  -Discussed if any worsening redness, pain, fever or chills to call or may need to report to the emergency room. Patient expressed understanding.   Procedure: Excisional Debridement of Wound Rationale: Removal of non-viable soft tissue from the wound to promote healing.  Anesthesia: none Pre-Debridement Wound Measurements: overlying slough   Post-Debridement Wound Measurements: 0.5 cm x 0.8 cm x 0.2 cm (right hallux)  Type of Debridement: Sharp Excisional Tissue Removed: Non-viable soft tissue Depth of Debridement: subcutaneous tissue. Technique: Sharp excisional debridement to bleeding, viable wound base.  Dressing: Dry, sterile, compression dressing. Disposition: Patient tolerated procedure well. Patient to return in 2 week for follow-up.  Return in about 2 weeks (around 04/26/2023) for wound  check.    Louann Sjogren, DPM

## 2023-04-17 ENCOUNTER — Encounter: Payer: Self-pay | Admitting: Physician Assistant

## 2023-04-17 ENCOUNTER — Encounter: Payer: Self-pay | Admitting: Behavioral Health

## 2023-04-17 ENCOUNTER — Ambulatory Visit: Payer: Commercial Managed Care - PPO

## 2023-04-17 ENCOUNTER — Other Ambulatory Visit: Payer: Self-pay | Admitting: Physician Assistant

## 2023-04-17 ENCOUNTER — Ambulatory Visit (INDEPENDENT_AMBULATORY_CARE_PROVIDER_SITE_OTHER): Payer: Commercial Managed Care - PPO | Admitting: Physician Assistant

## 2023-04-17 ENCOUNTER — Ambulatory Visit (INDEPENDENT_AMBULATORY_CARE_PROVIDER_SITE_OTHER): Payer: Commercial Managed Care - PPO | Admitting: Behavioral Health

## 2023-04-17 VITALS — BP 144/77 | HR 99 | Ht 71.0 in | Wt 224.0 lb

## 2023-04-17 DIAGNOSIS — F4310 Post-traumatic stress disorder, unspecified: Secondary | ICD-10-CM | POA: Diagnosis not present

## 2023-04-17 DIAGNOSIS — Z794 Long term (current) use of insulin: Secondary | ICD-10-CM | POA: Diagnosis not present

## 2023-04-17 DIAGNOSIS — E1129 Type 2 diabetes mellitus with other diabetic kidney complication: Secondary | ICD-10-CM | POA: Diagnosis not present

## 2023-04-17 DIAGNOSIS — I10 Essential (primary) hypertension: Secondary | ICD-10-CM

## 2023-04-17 DIAGNOSIS — E7800 Pure hypercholesterolemia, unspecified: Secondary | ICD-10-CM

## 2023-04-17 DIAGNOSIS — M79672 Pain in left foot: Secondary | ICD-10-CM

## 2023-04-17 DIAGNOSIS — M450 Ankylosing spondylitis of multiple sites in spine: Secondary | ICD-10-CM

## 2023-04-17 DIAGNOSIS — G250 Essential tremor: Secondary | ICD-10-CM

## 2023-04-17 DIAGNOSIS — F411 Generalized anxiety disorder: Secondary | ICD-10-CM

## 2023-04-17 DIAGNOSIS — Z23 Encounter for immunization: Secondary | ICD-10-CM

## 2023-04-17 DIAGNOSIS — M19011 Primary osteoarthritis, right shoulder: Secondary | ICD-10-CM

## 2023-04-17 DIAGNOSIS — M25511 Pain in right shoulder: Secondary | ICD-10-CM | POA: Diagnosis not present

## 2023-04-17 DIAGNOSIS — M79671 Pain in right foot: Secondary | ICD-10-CM

## 2023-04-17 DIAGNOSIS — E785 Hyperlipidemia, unspecified: Secondary | ICD-10-CM

## 2023-04-17 DIAGNOSIS — R809 Proteinuria, unspecified: Secondary | ICD-10-CM | POA: Diagnosis not present

## 2023-04-17 LAB — POCT GLYCOSYLATED HEMOGLOBIN (HGB A1C): Hemoglobin A1C: 6.6 % — AB (ref 4.0–5.6)

## 2023-04-17 MED ORDER — TOUJEO MAX SOLOSTAR 300 UNIT/ML ~~LOC~~ SOPN
14.0000 [IU] | PEN_INJECTOR | Freq: Two times a day (BID) | SUBCUTANEOUS | 1 refills | Status: DC
Start: 2023-04-17 — End: 2023-04-20

## 2023-04-17 MED ORDER — IMIPRAMINE HCL 50 MG PO TABS
200.0000 mg | ORAL_TABLET | Freq: Every day | ORAL | 1 refills | Status: DC
Start: 2023-04-17 — End: 2023-12-05

## 2023-04-17 MED ORDER — TRAMADOL HCL 50 MG PO TABS
ORAL_TABLET | ORAL | 0 refills | Status: DC
Start: 2023-04-17 — End: 2023-09-10

## 2023-04-17 MED ORDER — OLMESARTAN MEDOXOMIL 40 MG PO TABS: 40.0000 mg | ORAL_TABLET | Freq: Every day | ORAL | 0 refills | Status: DC

## 2023-04-17 MED ORDER — ICOSAPENT ETHYL 1 G PO CAPS
2.0000 g | ORAL_CAPSULE | Freq: Two times a day (BID) | ORAL | 11 refills | Status: AC
Start: 2023-04-17 — End: ?

## 2023-04-17 MED ORDER — PRIMIDONE 50 MG PO TABS
50.0000 mg | ORAL_TABLET | Freq: Every day | ORAL | 1 refills | Status: DC
Start: 2023-04-17 — End: 2023-10-12

## 2023-04-17 MED ORDER — AMLODIPINE BESYLATE 5 MG PO TABS
5.0000 mg | ORAL_TABLET | Freq: Every day | ORAL | 1 refills | Status: DC
Start: 2023-04-17 — End: 2023-09-10

## 2023-04-17 MED ORDER — TIRZEPATIDE 10 MG/0.5ML ~~LOC~~ SOAJ
10.0000 mg | SUBCUTANEOUS | 0 refills | Status: DC
Start: 2023-04-17 — End: 2023-08-24

## 2023-04-17 MED ORDER — HYDROCHLOROTHIAZIDE 25 MG PO TABS
25.0000 mg | ORAL_TABLET | Freq: Every day | ORAL | 1 refills | Status: DC
Start: 2023-04-17 — End: 2023-08-09

## 2023-04-17 NOTE — Progress Notes (Signed)
[facility] Counselor/Therapist Progress Note  Patient ID: Robert Frey, MRN: [id number],    Date: 04/17/2023  Time Spent: 8;00 AM to 8:56 a.m.,   56 minutes spent in person with the patient in outpatient therapy office.    Treatment Type: Individual Therapy  Reported Symptoms: Generalized anxiety disorder/stress, patient having repeated weird and/or bad dreams related to his past.  Mental Status Exam: Appearance:  Fairly Groomed     Behavior: Appropriate  Motor: Normal  Speech/Language:  Clear and Coherent  Affect: Appropriate  Mood: sad  Thought process: normal  Thought content:   WNL  Sensory/Perceptual disturbances:   WNL  Orientation: oriented to person, place, time/date, situation, day of week, and month of year  Attention: Good  Concentration: Good  Memory: WNL  Fund of knowledge:  Good  Insight:   Good  Judgment:  Good  Impulse Control: Good   Risk Assessment: Danger to Self:  No Self-injurious Behavior: No Danger to Others: No Duty to Warn:no Physical Aggression / Violence:No  Access to Firearms a concern: No  Gang Involvement:No   Subjective: The patient says that he feels that he has torn her rotator cuff fully.  There was already a partial tear but he woke up one morning in extreme pain and they will cannot lift his arm.  He is meeting with his PCP this morning.  The psoriasis with the exception of 1 spot on his foot has gotten better thankfully.  He still has 1 or 2 dreams a week sometimes he thinks nothing of them and sometimes are concerning.  He knows or feels that it is his brain trying to process some of his past.  His most recent dream involved water flying down into a hole in the earth and his mother getting drunk down into that.  He feels that it is symbolic of a lot of issues that he could not resolve with his mom.  We looked at what some of those issues were.  He has decided that he is definitely going to retire thinks it is  about 6 months because he knows he can deal better with his health issues that way.  He also has a lot of things that he wants to clean up at his house and/or sell so they can consolidate to 1 home on balance that with doing things finding retirement such as reading or fishing or etc.  He reports that he has not had any alcohol.  For the most part things are fairly stable at home.  The patient does contract for safety having no thoughts of hurting himself or anyone else.  Interventions: Cognitive Behavioral Therapy and Dialectical Behavioral Therapy  Diagnosis:PTSD  Plan: I will meet with the patient every 2 to 3 weeks in person. Treatment plan: We will use cognitive behavioral therapy principles as well as elements of dialectical behavior therapy to reduce depression and anxiety at least 50% over the next 6 months.  Target date will be March 29, 2023.  Goals for depression are to have less sadness as indicated by patient report and PH-9 score, to have improved mood and return to a healthier level of functioning and to identify historical or current causes for depressed mood and learn how to cope with the depression.  Interventions will be to use CBT to explore and replace unhealthy thoughts and behavior patterns and encouraged sharing of feelings related to causes of symptoms of depression including dreams and history, teach  and encouraged the use of coping skills for managing depressive symptoms.  Goals for reducing anxiety/stress are to improve his ability to manage anxiety symptoms and better handle stress, identify causes for anxiety and stress to manage thoughts and worrisome thinking contributing to feelings of anxiety.  We will facilitate solution skills for helping identify stressors, teach coping skills for reducing anxiety and stress and use cognitive behavioral therapy to identify and change anxiety producing thoughts and behavior patterns.  Lastly we will use DBT distress tolerance and  mindfulness skills  Progress: 35%.  I reviewed the treatment plans as listed above with the patient and he agreed to continue with those goals.  New target date will be September 26, 2023. [name], [facility]                                                             [name], [facility]               [name], [facility]               [name], [facility]               [name], [facility]          [name], [facility]               [name], [facility]               [name], [facility]

## 2023-04-17 NOTE — Progress Notes (Unsigned)
Established Patient Office Visit  Subjective   Patient ID: Robert Frey, male    DOB: Jul 30, 1960  Age: 62 y.o. MRN: [id number]  Chief Complaint  Patient presents with   Medical Management of Chronic Issues    Last A1c 6.7,pt states he's implemented some dietary changes recently and  believes his gabapentin is causing his glucose to spike to the 170's, pt is having rt shoulder pain due to sleeping on his arm wrong.    HPI 62 yo male presents today for diabetic follow up. His last A1c was 6.7. Patient switched from pregabalin to gabapentin due to inefficacy of pregabalin recently. He thinks the gabapentin is increasing his blood glucose. He takes at home blood sugars and it has spiked to 170s. Patient is seeing podiatry for diabetic foot ulcer management on his big toe. He is using Xeroform and Aquaphor which is helping. He was told by podiatry to stay off his feet. He is thinking about going on disability because his work requires him to be on his feet a lot. He recently got a xray of his foot and is awaiting results.   Patient also reports R shoulder pain starting about 1 wk ago. He felt sore after waking up and after moving it heard a "pop". Pain radiates to elbow. PMH of R shoulder separation.   Patient's psoriasis is better on his body and upper extremities, but his feet are still covered in lesions and are cracking. Aquaphor helps alleviate lesions.   Review of Systems  Respiratory:  Negative for shortness of breath.   Cardiovascular:  Negative for chest pain.  Musculoskeletal:  Positive for joint pain.      Objective:     BP (!) 144/77   Pulse 99   Ht 5\' 11"  (1.803 m)   Wt 101.6 kg   SpO2 99%   BMI 31.24 kg/m  BP Readings from Last 3 Encounters:  04/17/23 (!) 144/77  03/07/23 (!) 141/74  03/02/23 139/63      Physical Exam Cardiovascular:     Rate and Rhythm: Normal rate and regular rhythm.     Pulses: Normal pulses.     Heart sounds: Normal heart sounds.   Pulmonary:     Effort: Pulmonary effort is normal.     Breath sounds: Normal breath sounds.  Musculoskeletal:     Right shoulder: Tenderness and crepitus present. No swelling or deformity. Decreased range of motion. Decreased strength. Normal pulse.     Left shoulder: Normal.     Right upper arm: No swelling, edema, tenderness or bony tenderness.     Left upper arm: Normal.     Comments: Exam limited by pain in R shoulder. Decreased strength in R arm.        The 10-year ASCVD risk score (Arnett DK, et al., 2019) is: 28.8%    Assessment & Plan:   Problem List Items Addressed This Visit       Cardiovascular and Mediastinum   Essential hypertension   Relevant Medications   amLODipine (NORVASC) 5 MG tablet   hydrochlorothiazide (HYDRODIURIL) 25 MG tablet   icosapent Ethyl (VASCEPA) 1 g capsule   olmesartan (BENICAR) 40 MG tablet   Other Relevant Orders   CMP14+EGFR     Endocrine   Type 2 diabetes mellitus with microalbuminuria, with long-term current use of insulin ([name])   Relevant Medications   tirzepatide (MOUNJARO) 10 MG/0.5ML Pen   insulin glargine, 2 Unit Dial, (TOUJEO MAX SOLOSTAR) 300 UNIT/ML Solostar Pen  olmesartan (BENICAR) 40 MG tablet   Other Relevant Orders   POCT HgB A1C (Completed)     Nervous and Auditory   Essential tremor   Relevant Medications   primidone (MYSOLINE) 50 MG tablet   Other Relevant Orders   CMP14+EGFR     Musculoskeletal and Integument   Ankylosing spondylitis of multiple sites in spine ([name])   Relevant Medications   imipramine (TOFRANIL) 50 MG tablet   traMADol (ULTRAM) 50 MG tablet     Other   HYPERCHOLESTEROLEMIA   Relevant Medications   amLODipine (NORVASC) 5 MG tablet   hydrochlorothiazide (HYDRODIURIL) 25 MG tablet   icosapent Ethyl (VASCEPA) 1 g capsule   olmesartan (BENICAR) 40 MG tablet   Other Relevant Orders   CMP14+EGFR   Dyslipidemia, goal LDL below 70   Relevant Medications   amLODipine (NORVASC) 5 MG  tablet   hydrochlorothiazide (HYDRODIURIL) 25 MG tablet   icosapent Ethyl (VASCEPA) 1 g capsule   olmesartan (BENICAR) 40 MG tablet   Other Visit Diagnoses     Immunization due    -  Primary   Relevant Orders   Flu vaccine trivalent PF, 6mos and older(Flulaval,Afluria,Fluarix,Fluzone) (Completed)   Acute pain of right shoulder       Relevant Orders   DG Shoulder Right   Bilateral foot pain       Hyperlipidemia LDL goal <70       Relevant Medications   amLODipine (NORVASC) 5 MG tablet   hydrochlorothiazide (HYDRODIURIL) 25 MG tablet   icosapent Ethyl (VASCEPA) 1 g capsule   olmesartan (BENICAR) 40 MG tablet   Other Relevant Orders   CMP14+EGFR   Lipid panel      Today's A1c was 6.6. Continue taking at home blood sugars. Continue using Aquaphor and Xeroform for diabetic foot ulcer. Continue following up with podiatry for diabetic foot ulcer.  Discussed with patient to not stop or skip biologic medication for psoriasis.  Continue current medications.   Return in about 3 months (around 07/18/2023).    AnnaCollin M Mace, Student-PA

## 2023-04-17 NOTE — Patient Instructions (Addendum)
Increased benicar to 40mg  daily.  Tramadol as needed up to twice a day.

## 2023-04-18 ENCOUNTER — Encounter: Payer: Self-pay | Admitting: Physician Assistant

## 2023-04-18 ENCOUNTER — Telehealth: Payer: Self-pay | Admitting: Physician Assistant

## 2023-04-18 NOTE — Telephone Encounter (Signed)
Patient called in stating that he needed a PA for insulin glargine, 2 Unit Dial, (TOUJEO MAX SOLOSTAR) 300 UNIT/ML Solostar Pen.Also states that he will be out on Friday

## 2023-04-19 ENCOUNTER — Telehealth: Payer: Self-pay

## 2023-04-19 NOTE — Telephone Encounter (Addendum)
Initiated Prior authorization ZOX:[id number] Ethyl 1GM capsules Via: Covermymeds Case/Key:[id number] Status: approved as of 04/19/23 Reason:Authorization Expiration Date: 04/17/2024 Notified Pt via: called pt    Initiated Prior authorization WJX:[id number] Max SoloStar 300UNIT/ML pen-injectors Via: Covermymeds Case/Key:[id number] Status: approved as of 04/19/23 Reason:pt has high co-pay for medication at $109, called and spoke to pt ,pt is agreeable to co-pay.  Notified Pt via: called pt

## 2023-04-20 ENCOUNTER — Telehealth: Payer: Self-pay

## 2023-04-20 NOTE — Telephone Encounter (Signed)
I called and advised patient it will take a longer to get the results due to [facility] Radiology being short staffed.

## 2023-04-20 NOTE — Telephone Encounter (Signed)
Copied from CRM [id number]. Topic: Clinical - Lab/Test Results >> Apr 20, 2023  2:56 PM Hector Shade B wrote: Reason for CRM: Patient needed to speak to someone in the office to check to see if imaging results are complete

## 2023-04-23 ENCOUNTER — Telehealth: Payer: Self-pay

## 2023-04-23 NOTE — Telephone Encounter (Signed)
I called patient and advised him it will take a few more days to result. He is taking the tramadol and it is helping some. I advised him to call back in a few days if he doesn't hear from [place].

## 2023-04-23 NOTE — Telephone Encounter (Signed)
Copied from CRM [id number]. Topic: Clinical - Lab/Test Results >> Apr 23, 2023  9:46 AM Adelina Mings wrote: Reason for CRM: Requesting imaging results

## 2023-04-24 ENCOUNTER — Emergency Department (HOSPITAL_BASED_OUTPATIENT_CLINIC_OR_DEPARTMENT_OTHER): Payer: Commercial Managed Care - PPO

## 2023-04-24 ENCOUNTER — Other Ambulatory Visit: Payer: Self-pay

## 2023-04-24 ENCOUNTER — Encounter (HOSPITAL_BASED_OUTPATIENT_CLINIC_OR_DEPARTMENT_OTHER): Payer: Self-pay | Admitting: Urology

## 2023-04-24 ENCOUNTER — Emergency Department (HOSPITAL_BASED_OUTPATIENT_CLINIC_OR_DEPARTMENT_OTHER)
Admission: EM | Admit: 2023-04-24 | Discharge: 2023-04-24 | Disposition: A | Discharge status: Home / Self Care | Payer: Commercial Managed Care - PPO | Attending: Emergency Medicine | Admitting: Emergency Medicine

## 2023-04-24 ENCOUNTER — Ambulatory Visit
Admission: EM | Admit: 2023-04-24 | Discharge: 2023-04-24 | Disposition: A | Discharge status: Home / Self Care | Payer: Commercial Managed Care - PPO

## 2023-04-24 DIAGNOSIS — Z91040 Latex allergy status: Secondary | ICD-10-CM | POA: Insufficient documentation

## 2023-04-24 DIAGNOSIS — L03115 Cellulitis of right lower limb: Secondary | ICD-10-CM | POA: Diagnosis not present

## 2023-04-24 DIAGNOSIS — E119 Type 2 diabetes mellitus without complications: Secondary | ICD-10-CM | POA: Insufficient documentation

## 2023-04-24 DIAGNOSIS — J449 Chronic obstructive pulmonary disease, unspecified: Secondary | ICD-10-CM | POA: Insufficient documentation

## 2023-04-24 DIAGNOSIS — Z794 Long term (current) use of insulin: Secondary | ICD-10-CM | POA: Diagnosis not present

## 2023-04-24 DIAGNOSIS — L03116 Cellulitis of left lower limb: Secondary | ICD-10-CM | POA: Diagnosis not present

## 2023-04-24 LAB — CBC WITH DIFFERENTIAL/PLATELET
Abs Immature Granulocytes: 0.02 10*3/uL (ref 0.00–0.07)
Basophils Absolute: 0 10*3/uL (ref 0.0–0.1)
Basophils Relative: 1 %
Eosinophils Absolute: 0.4 10*3/uL (ref 0.0–0.5)
Eosinophils Relative: 4 %
HCT: 40 % (ref 39.0–52.0)
Hemoglobin: 13.8 g/dL (ref 13.0–17.0)
Immature Granulocytes: 0 %
Lymphocytes Relative: 20 %
Lymphs Abs: 1.7 10*3/uL (ref 0.7–4.0)
MCH: 31.9 pg (ref 26.0–34.0)
MCHC: 34.5 g/dL (ref 30.0–36.0)
MCV: 92.6 fL (ref 80.0–100.0)
Monocytes Absolute: 1.2 10*3/uL — ABNORMAL HIGH (ref 0.1–1.0)
Monocytes Relative: 14 %
Neutro Abs: 5.2 10*3/uL (ref 1.7–7.7)
Neutrophils Relative %: 61 %
Platelets: 254 10*3/uL (ref 150–400)
RBC: 4.32 MIL/uL (ref 4.22–5.81)
RDW: 13 % (ref 11.5–15.5)
WBC: 8.6 10*3/uL (ref 4.0–10.5)
nRBC: 0 % (ref 0.0–0.2)

## 2023-04-24 LAB — COMPREHENSIVE METABOLIC PANEL
ALT: 45 U/L — ABNORMAL HIGH (ref 0–44)
AST: 39 U/L (ref 15–41)
Albumin: 4.6 g/dL (ref 3.5–5.0)
Alkaline Phosphatase: 83 U/L (ref 38–126)
Anion gap: 9 (ref 5–15)
BUN: 17 mg/dL (ref 6–20)
CO2: 33 mmol/L — ABNORMAL HIGH (ref 22–32)
Calcium: 10.5 mg/dL — ABNORMAL HIGH (ref 8.9–10.3)
Chloride: 88 mmol/L — ABNORMAL LOW (ref 98–111)
Creatinine, Ser: 1.01 mg/dL (ref 0.61–1.24)
GFR, Estimated: >60 mL/min (ref >60)
Glucose, Bld: 131 mg/dL — ABNORMAL HIGH (ref 70–99)
Potassium: 3 mmol/L — ABNORMAL LOW (ref 3.5–5.1)
Sodium: 130 mmol/L — ABNORMAL LOW (ref 135–145)
Total Bilirubin: 0.5 mg/dL (ref <1.2)
Total Protein: 8.2 g/dL — ABNORMAL HIGH (ref 6.5–8.1)

## 2023-04-24 LAB — LACTIC ACID, PLASMA: Lactic Acid, Venous: 1.3 mmol/L (ref 0.5–1.9)

## 2023-04-24 MED ORDER — CEPHALEXIN 500 MG PO CAPS: 500.0000 mg | ORAL_CAPSULE | Freq: Four times a day (QID) | ORAL | 0 refills | Status: DC

## 2023-04-24 MED ORDER — POTASSIUM CHLORIDE CRYS ER 20 MEQ PO TBCR
40.0000 meq | EXTENDED_RELEASE_TABLET | Freq: Once | ORAL | Status: AC
  Administered 2023-04-24: 40 meq via ORAL
  Filled 2023-04-24: qty 2

## 2023-04-24 MED ORDER — DOXYCYCLINE HYCLATE 100 MG PO CAPS: 100.0000 mg | ORAL_CAPSULE | Freq: Two times a day (BID) | ORAL | 0 refills | Status: DC

## 2023-04-24 NOTE — ED Provider Notes (Signed)
[facility]    CSN: [id number] Arrival date & time: 04/24/23  1143      History   Chief Complaint Chief Complaint  Patient presents with   Leg Pain    HPI Robert Frey is a 62 y.o. male.   HPI 62 year old male presents with possible right leg and left leg cellulitis for 1 week.  PMH significant for morbid obesity, COPD, and T2DM without complication.  Patient is accompanied by his wife this morning.  Past Medical History:  Diagnosis Date   Ankylosing spondylitis ([name])    COPD (chronic obstructive pulmonary disease) ([name])    Diabetes mellitus without complication ([name])    High cholesterol    Hypertension    PTSD (post-traumatic stress disorder)     Patient Active Problem List   Diagnosis Date Noted   Diabetic ulcer of toe of right foot associated with type 2 diabetes mellitus, limited to breakdown of skin ([name]) 03/08/2023   Blister of plantar aspect of right foot 03/08/2023   Tinea pedis of both feet 02/28/2023   Heat exhaustion 12/05/2022   Dehydration 12/05/2022   Hyponatremia 12/05/2022   Dyslipidemia, goal LDL below 70 01/17/2022   Charcot's joint of right foot 08/03/2021   Syndesmotic disruption of right ankle 08/01/2021   Family history of blood clots 07/26/2021   Leg edema, right 07/26/2021   Right leg pain 07/26/2021   Absent pedal pulses 07/13/2021   Acute right ankle pain 07/13/2021   Diabetic foot infection ([name]) 06/28/2021   Psoriasis 06/28/2021   Acute left-sided low back pain without sciatica 06/07/2021   Chronic foot pain, right 05/03/2021   Rib pain on left side 03/07/2021   Fall 03/07/2021   Family history of Alzheimer's disease 11/09/2020   Diabetic neuropathy ([name]) 01/28/2020   Current smoker 10/22/2019   Nosebleed 10/22/2019   Primary osteoarthritis, right glenohumeral joint 07/02/2019   Shoulder separation, right, subsequent encounter 06/17/2019   Essential tremor 04/16/2019   Shakes 04/04/2019   Dark urine 01/01/2019    Lumbosacral strain 01/17/2017   Bruising 06/09/2015   Essential hypertension 06/08/2015   Microalbuminuria 06/08/2015   Lung nodule < 6cm on CT 05/15/2014   Heel spur 01/20/2014   Osteoarthritis of foot, left 01/20/2014   Psoriatic arthritis ([name]) 10/11/2012   Type 2 diabetes mellitus with microalbuminuria, with long-term current use of insulin ([name]) 10/11/2012   Ankylosing spondylitis of multiple sites in spine ([name]) 09/30/2009   HYPERCHOLESTEROLEMIA 03/06/2006   OBESITY, NOS 03/06/2006   OBSESSIVE COMPUL. DISORDER 03/06/2006    Past Surgical History:  Procedure Laterality Date   APPENDECTOMY     CARPAL TUNNEL RELEASE Right    ent surgery     ORIF CALCANEOUS FRACTURE Right 08/01/2021   Procedure: OPEN REDUCTION INTERNAL FIXATION (ORIF) FOOT FRACTURE;  Surgeon: Myrene Galas, MD;  Location: [facility] OR;  Service: Orthopedics;  Laterality: Right;   TONSILLECTOMY AND ADENOIDECTOMY         Home Medications    Prior to Admission medications   Medication Sig Start Date End Date Taking? Authorizing Provider  amLODipine (NORVASC) 5 MG tablet Take 1 tablet (5 mg total) by mouth daily. 04/17/23   Breeback, Jade L, PA-C  ammonium lactate (AMLACTIN) 12 % cream Apply 1 Application topically as needed for dry skin. 03/15/23   Louann Sjogren, DPM  ascorbic acid (VITAMIN C) 500 MG tablet Take 2 tablets (1,000 mg total) by mouth daily. 08/04/21   Montez Morita, PA-C  atorvastatin (LIPITOR) 80 MG tablet Take  1 tablet (80 mg total) by mouth daily. 06/12/22   Breeback, Lesly Rubenstein L, PA-C  Blood Glucose Monitoring Suppl DEVI 1 each by Does not apply route daily. May substitute to any manufacturer covered by patient's insurance. 09/11/22   Breeback, Jade L, PA-C  clobetasol cream (TEMOVATE) 0.05 % Apply 1 application topically 2 (two) times daily. 01/03/19   Breeback, Jade L, PA-C  Continuous Glucose Sensor (FREESTYLE LIBRE 14 DAY SENSOR) MISC 1 Application by Does not apply route every 14 (fourteen) days. Apply upper  deltoid every 14 days, use reader to determine blood sugars 03/02/23   Breeback, Jade L, PA-C  Dapagliflozin Pro-metFORMIN ER (XIGDUO XR) 02-999 MG TB24 Take 1 tablet by mouth daily. 06/12/22   Breeback, Jade L, PA-C  gabapentin (NEURONTIN) 300 MG capsule Take 1 capsule (300 mg total) by mouth 2 (two) times daily. 04/12/23   Louann Sjogren, DPM  glucose blood (ACCU-CHEK GUIDE) test strip 1 EACH BY IN VITRO ROUTE IN THE MORNING, AT NOON, AND AT BEDTIME. MAY SUBSTITUTE TO ANY MANUFACTURER COVERED BY PATIENT'S INSURANCE. 10/24/22   Breeback, Jade L, PA-C  hydrochlorothiazide (HYDRODIURIL) 25 MG tablet Take 1 tablet (25 mg total) by mouth daily. 04/17/23   Breeback, Lonna Cobb, PA-C  icosapent Ethyl (VASCEPA) 1 g capsule Take 2 capsules (2 g total) by mouth 2 (two) times daily. 04/17/23   Breeback, Jade L, PA-C  imipramine (TOFRANIL) 50 MG tablet Take 4 tablets (200 mg total) by mouth at bedtime. 04/17/23   Breeback, Jade L, PA-C  insulin glargine, 2 Unit Dial, (TOUJEO MAX SOLOSTAR) 300 UNIT/ML Solostar Pen Inject 300 Units into the skin as directed. 04/20/23   Breeback, Lesly Rubenstein L, PA-C  Insulin Pen Needle (PEN NEEDLES) 32G X 4 MM MISC Inject 1 Units into the skin daily. 11/06/16   Sunnie Nielsen, DO  ketoconazole (NIZORAL) 2 % cream Apply 1 Application topically 2 (two) times daily. To affected areas on hands and feet. 02/28/23   Breeback, Lonna Cobb, PA-C  Multiple Vitamin (MULTIVITAMIN ADULT PO) Take 1 tablet by mouth daily.    [provider]  olmesartan (BENICAR) 40 MG tablet TAKE 1 TABLET BY MOUTH EVERY DAY 04/18/23   Breeback, Jade L, PA-C  primidone (MYSOLINE) 50 MG tablet Take 1 tablet (50 mg total) by mouth daily. 04/17/23   Breeback, Jade L, PA-C  Secukinumab (COSENTYX [place]) Inject into the skin every 30 (thirty) days.    [provider]  tirzepatide Greggory Keen) 10 MG/0.5ML Pen Inject 10 mg into the skin once a week. 04/17/23   Breeback, Lonna Cobb, PA-C  traMADol (ULTRAM) 50 MG tablet Take  one tablet twice a day as needed for moderate pain. 04/17/23   Breeback, Jade L, PA-C  triamcinolone 0.5%-Eucerin equivalent 1:1 cream mixture Apply topically 2 (two) times daily. Mix 0.5% triamcinolone cream and Eucerin in a one-to-one mixture. 06/28/21   Breeback, Lonna Cobb, PA-C  Vitamin D, Ergocalciferol, (DRISDOL) 1.25 MG (50000 UNIT) CAPS capsule Take 1 capsule (50,000 Units total) by mouth every 7 (seven) days. 08/09/21   Montez Morita, PA-C    Family History Family History  Problem Relation Age of Onset   Stroke Other    Alcohol abuse Other    Depression Other    Hypertension Other    Hyperlipidemia Other    Diabetes Mother    Alzheimer's disease Mother    Diabetes Sister    Stroke Sister     Social History Social History   Tobacco Use   Smoking status:  Every Day    Current packs/day: 1.00    Average packs/day: 1 pack/day for 25.0 years (25.0 ttl pk-yrs)    Types: Cigarettes   Smokeless tobacco: Never  Vaping Use   Vaping status: Never Used  Substance Use Topics   Alcohol use: Yes    Alcohol/week: 2.0 standard drinks of alcohol    Types: 2 Cans of beer per week    Comment: not since early April 2024   Drug use: No     Allergies   Green dyes, Propranolol, Latex, and Zithromax [azithromycin dihydrate]   Review of Systems Review of Systems   Physical Exam Triage Vital Signs ED Triage Vitals  Encounter Vitals Group     BP 04/24/23 1158 117/67     Systolic BP Percentile --      Diastolic BP Percentile --      Pulse Rate 04/24/23 1158 94     Resp 04/24/23 1158 18     Temp 04/24/23 1158 98.4 F (36.9 C)     Temp src --      SpO2 04/24/23 1158 99 %     Weight --      Height --      Head Circumference --      Peak Flow --      Pain Score 04/24/23 1202 7     Pain Loc --      Pain Education --      Exclude from Growth Chart --    No data found.  Updated Vital Signs BP 117/67   Pulse 94   Temp 98.4 F (36.9 C)   Resp 18   SpO2 99%      Physical  Exam Vitals and nursing note reviewed.  Constitutional:      Appearance: Normal appearance. He is obese.  HENT:     Head: Normocephalic and atraumatic.     Mouth/Throat:     Mouth: Mucous membranes are moist.     Pharynx: Oropharynx is clear.  Eyes:     Extraocular Movements: Extraocular movements intact.     Conjunctiva/sclera: Conjunctivae normal.     Pupils: Pupils are equal, round, and reactive to light.  Cardiovascular:     Rate and Rhythm: Normal rate and regular rhythm.     Pulses: Normal pulses.     Heart sounds: Normal heart sounds.  Pulmonary:     Effort: Pulmonary effort is normal.     Breath sounds: Normal breath sounds. No wheezing, rhonchi or rales.  Musculoskeletal:        General: Normal range of motion.     Cervical back: Normal range of motion and neck supple.  Skin:    General: Skin is warm and dry.     Comments: Bilateral lower legs (anterior aspects): Erythematous, indurated, fluctuant with soft tissue swelling noted-please see images below  Neurological:     General: No focal deficit present.     Mental Status: He is alert and oriented to person, place, and time. Mental status is at baseline.  Psychiatric:        Mood and Affect: Mood normal.        Behavior: Behavior normal.           UC Treatments / Results  Labs (all labs ordered are listed, but only abnormal results are displayed) Labs Reviewed - No data to display  EKG   Radiology No results found.  Procedures Procedures (including critical care time)  Medications Ordered in UC Medications -  No data to display  Initial Impression / Assessment and Plan / UC Course  I have reviewed the triage vital signs and the nursing notes.  Pertinent labs & imaging results that were available during my care of the patient were reviewed by me and considered in my medical decision making (see chart for details).     MDM: 1.  Cellulitis of right leg-Advised patient/wife to go to [facility] ED now for further evaluation of bilateral lower leg cellulitis.  2.  Cellulitis of left leg-Advised patient/wife to go to [facility] ED now for further evaluation of bilateral lower leg cellulitis.  Patient/wife agreed and verbalized understanding of these instructions and this plan of care today.  Final Clinical Impressions(s) / UC Diagnoses   Final diagnoses:  Cellulitis of right leg  Cellulitis of left leg     Discharge Instructions      Advised patient/wife to go to [facility] ED now for further evaluation of bilateral lower leg cellulitis.     ED Prescriptions   None    PDMP not reviewed this encounter.   Trevor Iha, FNP 04/24/23 1314

## 2023-04-24 NOTE — Progress Notes (Signed)
Moderate arthritis of right shoulder. Get in with orthopedics or Dr. Karie Schwalbe to consider injection in shoulder. Physical therapy could also help.

## 2023-04-24 NOTE — ED Triage Notes (Signed)
Was told by [facility] at [facility] he has cellulitis and needs antibiotics. Area is to right lower leg underneath brace, has been present for about a week. Sees podiatry for psoriasis.

## 2023-04-24 NOTE — ED Triage Notes (Signed)
Sent from UC ror RLL cellulitis  States started approx 1 week ago, possible toe infection   H/o DM

## 2023-04-24 NOTE — ED Provider Notes (Signed)
[place] [facility] AT [facility] Provider Note   CSN: [id number] Arrival date & time: 04/24/23  1319     History  Chief Complaint  Patient presents with   Wound Infection    Robert Frey is a 62 y.o. male past medical history of obesity, psoriasis on immunosuppressant injection medications, diabetes, chronic foot ulcerations, right Charcot foot, COPD presenting to emergency room today with concern for cellulitis.  Patient has a longstanding history of right great toe ulcer which he feels the appearance is starting to improve, however started experiencing pain in right great toe and cellulitis of this foot.  He also has noticed cellulitis of the upper right leg and left foot which started this morning.  Patient reports area is tender to the touch, red, and painful. He is concerned he is starting to develop cellulitis and bone infections.  Patient has been fatigued.  Denies fevers chills at home.  Denies chest pain, cough, shortness of breath, abdominal pain.  Patient is able to ambulate with discomfort.  HPI     Home Medications Prior to Admission medications   Medication Sig Start Date End Date Taking? Authorizing Provider  amLODipine (NORVASC) 5 MG tablet Take 1 tablet (5 mg total) by mouth daily. 04/17/23   Breeback, Jade L, PA-C  ammonium lactate (AMLACTIN) 12 % cream Apply 1 Application topically as needed for dry skin. 03/15/23   Louann Sjogren, DPM  ascorbic acid (VITAMIN C) 500 MG tablet Take 2 tablets (1,000 mg total) by mouth daily. 08/04/21   Montez Morita, PA-C  atorvastatin (LIPITOR) 80 MG tablet Take 1 tablet (80 mg total) by mouth daily. 06/12/22   Breeback, Lesly Rubenstein L, PA-C  Blood Glucose Monitoring Suppl DEVI 1 each by Does not apply route daily. May substitute to any manufacturer covered by patient's insurance. 09/11/22   Breeback, Jade L, PA-C  clobetasol cream (TEMOVATE) 0.05 % Apply 1 application topically 2 (two) times daily. 01/03/19   Breeback, Jade  L, PA-C  Continuous Glucose Sensor (FREESTYLE LIBRE 14 DAY SENSOR) MISC 1 Application by Does not apply route every 14 (fourteen) days. Apply upper deltoid every 14 days, use reader to determine blood sugars 03/02/23   Breeback, Jade L, PA-C  Dapagliflozin Pro-metFORMIN ER (XIGDUO XR) 02-999 MG TB24 Take 1 tablet by mouth daily. 06/12/22   Breeback, Jade L, PA-C  gabapentin (NEURONTIN) 300 MG capsule Take 1 capsule (300 mg total) by mouth 2 (two) times daily. 04/12/23   Louann Sjogren, DPM  glucose blood (ACCU-CHEK GUIDE) test strip 1 EACH BY IN VITRO ROUTE IN THE MORNING, AT NOON, AND AT BEDTIME. MAY SUBSTITUTE TO ANY MANUFACTURER COVERED BY PATIENT'S INSURANCE. 10/24/22   Breeback, Jade L, PA-C  hydrochlorothiazide (HYDRODIURIL) 25 MG tablet Take 1 tablet (25 mg total) by mouth daily. 04/17/23   Breeback, Lonna Cobb, PA-C  icosapent Ethyl (VASCEPA) 1 g capsule Take 2 capsules (2 g total) by mouth 2 (two) times daily. 04/17/23   Breeback, Jade L, PA-C  imipramine (TOFRANIL) 50 MG tablet Take 4 tablets (200 mg total) by mouth at bedtime. 04/17/23   Breeback, Jade L, PA-C  insulin glargine, 2 Unit Dial, (TOUJEO MAX SOLOSTAR) 300 UNIT/ML Solostar Pen Inject 300 Units into the skin as directed. 04/20/23   Breeback, Lesly Rubenstein L, PA-C  Insulin Pen Needle (PEN NEEDLES) 32G X 4 MM MISC Inject 1 Units into the skin daily. 11/06/16   Sunnie Nielsen, DO  ketoconazole (NIZORAL) 2 % cream Apply 1 Application topically 2 (  two) times daily. To affected areas on hands and feet. 02/28/23   Breeback, Lonna Cobb, PA-C  Multiple Vitamin (MULTIVITAMIN ADULT PO) Take 1 tablet by mouth daily.    [provider]  olmesartan (BENICAR) 40 MG tablet TAKE 1 TABLET BY MOUTH EVERY DAY 04/18/23   Breeback, Jade L, PA-C  primidone (MYSOLINE) 50 MG tablet Take 1 tablet (50 mg total) by mouth daily. 04/17/23   Breeback, Jade L, PA-C  Secukinumab (COSENTYX [place]) Inject into the skin every 30 (thirty) days.    [provider]   tirzepatide Greggory Keen) 10 MG/0.5ML Pen Inject 10 mg into the skin once a week. 04/17/23   Breeback, Lonna Cobb, PA-C  traMADol (ULTRAM) 50 MG tablet Take one tablet twice a day as needed for moderate pain. 04/17/23   Breeback, Jade L, PA-C  triamcinolone 0.5%-Eucerin equivalent 1:1 cream mixture Apply topically 2 (two) times daily. Mix 0.5% triamcinolone cream and Eucerin in a one-to-one mixture. 06/28/21   Breeback, Lonna Cobb, PA-C  Vitamin D, Ergocalciferol, (DRISDOL) 1.25 MG (50000 UNIT) CAPS capsule Take 1 capsule (50,000 Units total) by mouth every 7 (seven) days. 08/09/21   Montez Morita, PA-C      Allergies    Green dyes, Propranolol, Latex, and Zithromax [azithromycin dihydrate]    Review of Systems   Review of Systems  Skin:  Positive for wound.    Physical Exam Updated Vital Signs BP 122/63 (BP Location: Left Arm)   Pulse 90   Temp 98.8 F (37.1 C)   Resp 18   Ht 5\' 11"  (1.803 m)   Wt 101.6 kg   SpO2 99%   BMI 31.24 kg/m  Physical Exam Vitals and nursing note reviewed.  Constitutional:      General: He is not in acute distress.    Appearance: He is not toxic-appearing.  HENT:     Head: Normocephalic and atraumatic.  Eyes:     General: No scleral icterus.    Conjunctiva/sclera: Conjunctivae normal.  Cardiovascular:     Rate and Rhythm: Normal rate and regular rhythm.     Pulses: Normal pulses.     Heart sounds: Normal heart sounds.  Pulmonary:     Effort: Pulmonary effort is normal. No respiratory distress.     Breath sounds: Normal breath sounds.  Abdominal:     General: Abdomen is flat. Bowel sounds are normal.     Palpations: Abdomen is soft.     Tenderness: There is no abdominal tenderness.  Musculoskeletal:        General: Deformity present. No swelling or tenderness.  Skin:    General: Skin is warm and dry.     Capillary Refill: Capillary refill takes less than 2 seconds.     Findings: Erythema and rash present. No lesion.  Neurological:     General: No  focal deficit present.     Mental Status: He is alert and oriented to person, place, and time. Mental status is at baseline.          ED Results / Procedures / Treatments   Labs (all labs ordered are listed, but only abnormal results are displayed) Labs Reviewed  COMPREHENSIVE METABOLIC PANEL - Abnormal; Notable for the following components:      Result Value   Sodium 130 (*)    Potassium 3.0 (*)    Chloride 88 (*)    CO2 33 (*)    Glucose, Bld 131 (*)    Calcium 10.5 (*)    Total Protein  8.2 (*)    ALT 45 (*)    All other components within normal limits  CBC WITH DIFFERENTIAL/PLATELET - Abnormal; Notable for the following components:   Monocytes Absolute 1.2 (*)    All other components within normal limits  CULTURE, BLOOD (ROUTINE X 2)  CULTURE, BLOOD (ROUTINE X 2)  LACTIC ACID, PLASMA    EKG None  Radiology DG Foot Complete Left  Result Date: 04/24/2023 CLINICAL DATA:  right great toe, right second toe and right heel ulcer, left foot cellulitis. EXAM: LEFT FOOT - COMPLETE 3+ VIEW COMPARISON:  09/30/2019. FINDINGS: No acute fracture or dislocation. No aggressive osseous lesion. Mild degenerative changes of imaged joints. Calcaneal spur noted along the Achilles tendon and Plantar aponeurosis attachment sites. No focal soft tissue swelling. No radiopaque foreign bodies. IMPRESSION: *No acute osseous abnormality of the left foot. Electronically Signed   By: Jules Schick M.D.   On: 04/24/2023 16:07   DG Foot Complete Right  Result Date: 04/24/2023 CLINICAL DATA:  right great toe, right second toe and right heel ulcer, left foot cellulitis EXAM: RIGHT FOOT COMPLETE - 3+ VIEW COMPARISON:  04/12/2023. FINDINGS: No acute fracture or dislocation. No aggressive osseous lesion. Note is made of pes planus deformity. There are mild-to-moderate degenerative changes of imaged joints with asymmetric more involvement of midfoot joints. Calcaneal spur noted along the Achilles tendon and  Plantar aponeurosis attachment sites. No focal soft tissue swelling. No evidence of air within the soft tissue. No radiopaque foreign bodies. Metallic hardware noted in the distal fibula and tibia. IMPRESSION: *No radiographic evidence of osteomyelitis. *Redemonstration of asymmetric degenerative changes of the midfoot joints. *Other nonacute observations, as described above. Electronically Signed   By: Jules Schick M.D.   On: 04/24/2023 16:03    Procedures Procedures    Medications Ordered in ED Medications - No data to display  ED Course/ Medical Decision Making/ A&P                                 Medical Decision Making Amount and/or Complexity of Data Reviewed Labs: ordered. Radiology: ordered.  Risk Prescription drug management.   Becky Mckinzie Vandenberg 62 y.o. presented today for BLE cellulitis and foot ulcer. Working Ddx: dependent edema, venous insufficiency, thrombophlebitis, secondary to medications, CHF, edema, AKI, nephrotic syndrome, dvt, cellulitis, diabetic foot ulcer, osteomyelitis   R/o DDx: These are considered less likely than current impression due to history of present illness, physical exam, labs/imaging findings.  Review of prior external notes: 04/24/23 & recent imaging visit with podiatrist   Unique Tests and My Interpretation:  CMP: Sodium 130, potassium delivery supplemented.  No AKI.  Lactic 1.3 thus repeat lactic not obtained.  CBC without leukocytosis and no anemia Lactic normal  Blood cultures X-ray of right and left foot to evaluate for osteomyelitis -- no acute osteomyelitis on imaging.    Problem List / ED Course / Critical interventions / Medication management  Patient reported to emergency room with right sided cellulitis.  Patient does report he started experiencing tenderness and warmth over this area as well.  Patient does have chronic history of ulcers which family reports have been improving significantly he has been seen with podiatry and has  regular follow-up.   Patient is hemodynamically stable and well-appearing answering questions appropriately.  He is at 100% on room air does not have tachycardia or fever.  Patient does not have leukocytosis.  I have  low suspicion for osteomyelitis as his ulcers are improving and this cellulitis has just started today.  Reassuring normal imaging without sign of osteomyelitis.  Patient has strong dorsal pedal pulse bilaterally good cap refill.  Given the patient does not meet SIRS criteria overall well-appearing and has close follow-up I think it would be appropriate to initiate cellulitis treatment outpatient with strict return precautions.  Family and patient feel comfortable with this plan or will return to emergency room if he does not start to have improvement of symptoms within 2 to 3 days or starts developing any new or worsening symptoms.  Discussed my ED attending provider prior to discharge who agrees to plan.  Because patient has longstanding history of psoriasis I have also given him follow-up with dermatology for further management.  Patients vitals assessed. Upon arrival patient is hemodynamically stable.  I have reviewed the patients home medicines and have made adjustments as needed  Consult: None     Plan:  Follow up with podiatry and start antibiotics for cellulitis. Follow-up with dermatology for psoriasis. Patient stable for discharge all questions answered. Patient has appropriate follow-up, understanding diagnosis and treatment plan.        Final Clinical Impression(s) / ED Diagnoses Final diagnoses:  Cellulitis of right foot    Rx / DC Orders ED Discharge Orders     None         Smitty Knudsen, PA-C 04/24/23 1903    Ernie Avena, MD 04/25/23 [phone]

## 2023-04-24 NOTE — Discharge Instructions (Addendum)
Advised patient/wife to go to [facility] ED now for further evaluation of bilateral lower leg cellulitis.

## 2023-04-24 NOTE — Discharge Instructions (Addendum)
Please follow-up with dermatology for psoriasis.  Please follow up with podiatrist for continued management of ulcer and cellulitis.   I have sent antibiotics to your pharmacy for cellulitis please take as prescribed.  If you are not noting any improvement in symptoms in 2 to 3 days, or worsening symptoms please return to the emergency room.  Blood cultures pending. Hospital will call if positive or need for return.

## 2023-04-29 LAB — CULTURE, BLOOD (ROUTINE X 2)
Culture: NO GROWTH
Culture: NO GROWTH
Special Requests: ADEQUATE
Special Requests: ADEQUATE

## 2023-04-30 ENCOUNTER — Encounter: Payer: Self-pay | Admitting: Physician Assistant

## 2023-04-30 ENCOUNTER — Ambulatory Visit (INDEPENDENT_AMBULATORY_CARE_PROVIDER_SITE_OTHER): Payer: Commercial Managed Care - PPO | Admitting: Physician Assistant

## 2023-04-30 VITALS — BP 104/59 | HR 106 | Ht 71.0 in | Wt 221.0 lb

## 2023-04-30 DIAGNOSIS — T50905A Adverse effect of unspecified drugs, medicaments and biological substances, initial encounter: Secondary | ICD-10-CM | POA: Diagnosis not present

## 2023-04-30 DIAGNOSIS — R21 Rash and other nonspecific skin eruption: Secondary | ICD-10-CM

## 2023-04-30 DIAGNOSIS — L03115 Cellulitis of right lower limb: Secondary | ICD-10-CM | POA: Diagnosis not present

## 2023-04-30 MED ORDER — HYDROXYZINE PAMOATE 25 MG PO CAPS: ORAL_CAPSULE | ORAL | 0 refills | Status: DC

## 2023-04-30 MED ORDER — METHYLPREDNISOLONE SODIUM SUCC 125 MG IJ SOLR
125.0000 mg | Freq: Once | INTRAMUSCULAR | Status: AC
  Administered 2023-04-30: 125 mg via INTRAMUSCULAR

## 2023-04-30 MED ORDER — PREDNISONE 20 MG PO TABS: ORAL_TABLET | ORAL | 0 refills | Status: DC

## 2023-04-30 NOTE — Progress Notes (Unsigned)
   Acute Office Visit  Subjective:     Patient ID: Robert Frey, male    DOB: 29-Sep-1960, 62 y.o.   MRN: [id number]  Chief Complaint  Patient presents with   Rash    Rash   Patient is in today for pruritic rash that started on 11/26 after being treated for right foot cellulitis at ED with cephalexin and doxycycline. He has taken each of these abx before, but not together. He has taken doxycyline many times in the past and this has never happened. He stopped all of his other medications,including gabapentin, to determine if they were causing rash. He went to rheumatology earlier today and they think it is an allergic reaction, not a psoriasis flare.  Review of Systems  Skin:  Positive for itching and rash.        Objective:    BP (!) 104/59   Pulse (!) 106   Ht 5\' 11"  (1.803 m)   Wt 221 lb (100.2 kg)   SpO2 99%   BMI 30.82 kg/m  BP Readings from Last 3 Encounters:  04/30/23 (!) 104/59  04/24/23 138/84  04/24/23 117/67   Wt Readings from Last 3 Encounters:  04/30/23 221 lb (100.2 kg)  04/24/23 223 lb 15.8 oz (101.6 kg)  04/17/23 224 lb (101.6 kg)      Physical Exam Pulmonary:     Effort: Pulmonary effort is normal.  Skin:    Findings: Rash present.     Comments: Pruritic, scaling, erythematous blotches covering trunk and extremities, sparing head.  Neurological:     General: No focal deficit present.     Mental Status: He is alert and oriented to person, place, and time.          Assessment & Plan:  Marland KitchenMarland KitchenPilot was seen today for rash.  Diagnoses and all orders for this visit:  Adverse effect of drug, initial encounter -     hydrOXYzine (VISTARIL) 25 MG capsule; Take one to two tablets as needed for itching. -     predniSONE (DELTASONE) 20 MG tablet; Take 3 tablets for 3 days, take 2 tablets for 3 days, take 1 tablet for 3 days, take 1/2 tablet for 4 days. -     methylPREDNISolone sodium succinate (SOLU-MEDROL) 125 mg/2 mL injection 125 mg  Rash -      hydrOXYzine (VISTARIL) 25 MG capsule; Take one to two tablets as needed for itching. -     predniSONE (DELTASONE) 20 MG tablet; Take 3 tablets for 3 days, take 2 tablets for 3 days, take 1 tablet for 3 days, take 1/2 tablet for 4 days. -     methylPREDNISolone sodium succinate (SOLU-MEDROL) 125 mg/2 mL injection 125 mg  Cellulitis of right foot   Most likely drug reaction to cephalexin. Stop cephalexin and continue doxycycline. Hydroxyzine and prednisone taper prescribed for itching and inflammation. In office methylprednisolone injection for inflammation and itching. Recommended cool compresses and moisturizers like Aquaphor. Follow up as needed if symptoms persist or worsen.  Cellulitis appears to be improving.  Keep follow up with podiatry.    Return if symptoms worsen or fail to improve.  Robert Gaw, PA-C

## 2023-04-30 NOTE — Patient Instructions (Addendum)
Start prednisone in the morning.  Vistaril for itching.  Stop keflex Continue doxycycline Use aquafor to keep moisturized and cool compresses  Drug Allergy  A drug allergy happens when the body's disease-fighting system (immune system) reacts badly to a medicine. Drug allergies range from mild to severe. A drug allergy is not the same as a medicine side effect, which is a known possible reaction to the drug. A drug allergy is also different from medicine toxicity caused by an overdose of the drug. The time of an allergic reaction varies. Symptoms often appear between 1 to 2 hours after taking the medicine; however, some allergic reactions occur 1 week or more after you are exposed to a medicine (delayed reaction). A sudden (acute), severe allergic reaction that affects multiple areas of the body is called an anaphylactic reaction (anaphylaxis). Anaphylaxis can be life-threatening. All allergic reactions to a medicine require medical evaluation, even if the allergic reaction appears to be mild. What are the causes? This condition is caused by the immune system wrongly identifying a medication as being harmful. When this happens, the body releases proteins (antibodies) and other compounds, such as histamine, into the bloodstream. This causes swelling in certain tissues and reduces blood flow to important areas, such as the heart and lungs. Almost any medicine can cause an allergic reaction. Medicines that commonly cause allergic reactions (common allergens) include: Antibiotics, such as penicillin. Sulfa medicines (sulfonamides). Medicines that numb certain areas of the body (local anesthetics). X-ray dyes that contain iodine. Pain-relievers. This includes aspirin and NSAIDs, such as ibuprofen or naproxen sodium. Chemotherapy drugs for treating cancer. Medicines for autoimmune diseases, such as rheumatoid arthritis. What are the signs or symptoms? Common symptoms of a mild allergic reaction  include: Nasal congestion. Tingling in the mouth or tongue. An itchy, red rash. Common symptoms of a severe allergic reaction include: Swelling of the face, eyes, lips, or tongue, including the back of the mouth and throat. Difficulty speaking (hoarseness) or swallowing, or making high-pitched whistling sounds, most often when you breathe out (wheezing). Itchy, red, swollen areas of skin (hives). Dizziness, light-headedness, or fainting. Anxiety or confusion. Chest tightness and fast or irregular heartbeats (palpitations). Abdominal pain, vomiting, or diarrhea. How is this diagnosed? This condition is diagnosed based on a physical exam and your history of recent exposure to one or more medicines. You may be referred for follow-up testing by a health care provider who specializes in allergies. This testing can confirm the diagnosis of a drug allergy and determine which medicines you are allergic to. Testing may include: Skin tests. These may involve: Injecting a small amount of the possible allergen between layers of your skin (intradermal injection). Applying patches to your skin. Blood tests. Drug challenge. For this test, a health care provider gives you a small amount of a medicine in gradual doses while watching for an allergic reaction. If you are unsure of what caused your allergic reaction, your health care provider may ask you for: Information about all medicines that you take on a regular basis. The date and time of your reaction. How is this treated? There is no cure for allergies. However, an allergic reaction can be treated with: Medicines that help: Reduce pain and swelling (NSAIDs). Relieve itching and hives (antihistamines). Reduce swelling (corticosteroids). Respiratory inhalers. These are inhaled medicines that help open (dilate) the airways in your lungs. Injections of medicine that helps to relax the muscles in your airways and tighten your blood vessels  (epinephrine). Severe allergic reactions, such  as anaphylaxis, require immediate treatment in a hospital. You may need to be hospitalized for observation. You may also be prescribed rescue medicines, such as epinephrine. Epinephrine comes in many forms, including what is commonly called an auto-injector "pen" (pre-filled automatic epinephrine injection device). Follow these instructions at home: If you have a severe allergy  Always keep an auto-injector pen or your anaphylaxis kit near you. This can be lifesaving if you have a severe reaction. Use your auto-injector pen or anaphylaxis kit as told by your health care provider. Make sure that you, the members of your household, and your employer know: How to use your auto-injector pen or anaphylaxis kit. How to use your auto-injector pen to give you an epinephrine injection. Replace your auto-injector pen or anaphylaxis kit immediately after use, in case you have another reaction. Wear a medical alert bracelet or necklace that states your drug allergy, if told by your health care provider. General instructions Avoid medicines that you are allergic to. Take over-the-counter and prescription medicines only as told by your health care provider. If you were given medicines to treat your allergic reaction, do not drive until your health care provider tells you it is safe. If you have hives or a rash: Use an over-the-counter antihistamine as told by your health care provider. Apply cold, wet cloths (cold compresses) to your skin or take baths or showers in cool water. Avoid hot water. If you had tests done, it is up to you to get your test results. Ask your health care provider when your results will be ready. Tell all your health care providers that you have a drug allergy. Keep all follow-up visits This is important. Contact a health care provider if: You think that you are having a mild allergic reaction. Symptoms of an allergic reaction usually  start within 1 hour after you are exposed to a medicine. You have symptoms that last more than 2 days after your reaction. You develop new signs or symptoms. Get help right away if: You needed to use epinephrine. An epinephrine injection helps to manage life-threatening allergic reactions, but you still need to go to the emergency room even if epinephrine seems to work. This is important because anaphylaxis may happen again within 72 hours (rebound anaphylaxis). If you used epinephrine to treat anaphylaxis outside of the hospital, you need additional medical care. This may include more doses of epinephrine. You develop signs or symptoms of a severe allergic reaction. These symptoms may represent a serious problem that is an emergency. Do not wait to see if the symptoms will go away. Use your auto-injector pen or anaphylaxis kit as you have been instructed, and get medical help right away. Call your local emergency services (911 in the [place] Do not drive yourself to the hospital. Summary A drug allergy happens when the body's disease-fighting system reacts badly to a medicine. Drug allergies range from mild to severe. In some cases, an allergic reaction may be life-threatening. If you have a severe allergy, always keep an auto-injector pen or your anaphylaxis kit near you. This information is not intended to replace advice given to you by your health care provider. Make sure you discuss any questions you have with your health care provider. Document Revised: 10/25/2020 Document Reviewed: 10/25/2020 Elsevier Patient Education  2024 [employer].

## 2023-05-01 ENCOUNTER — Encounter: Payer: Self-pay | Admitting: Physician Assistant

## 2023-05-01 DIAGNOSIS — L03115 Cellulitis of right lower limb: Secondary | ICD-10-CM | POA: Insufficient documentation

## 2023-05-01 DIAGNOSIS — R21 Rash and other nonspecific skin eruption: Secondary | ICD-10-CM | POA: Insufficient documentation

## 2023-05-03 ENCOUNTER — Ambulatory Visit (INDEPENDENT_AMBULATORY_CARE_PROVIDER_SITE_OTHER): Payer: Commercial Managed Care - PPO | Admitting: Podiatry

## 2023-05-03 DIAGNOSIS — L97512 Non-pressure chronic ulcer of other part of right foot with fat layer exposed: Secondary | ICD-10-CM | POA: Diagnosis not present

## 2023-05-03 DIAGNOSIS — E11621 Type 2 diabetes mellitus with foot ulcer: Secondary | ICD-10-CM

## 2023-05-03 DIAGNOSIS — L409 Psoriasis, unspecified: Secondary | ICD-10-CM

## 2023-05-03 NOTE — Progress Notes (Signed)
  Subjective:  Patient ID: Robert Frey, male    DOB: 08-Jul-1960,   MRN: [id number]  Chief Complaint  Patient presents with   Diabetic Ulcer      Pt presents for follow-up of left foot wound. States he has  has been dressing as he was told  The toe is about the same but  the bottom  have been improving.    62 y.o. male presents for  follow-up of left foot wound. Relates has been dressing as instructed. T  Patient has a history of psoriasis and had been treating this.  Relates doing better but still painful. Relates burning and tingling in their feet. Patient is diabetic and last A1c was  Lab Results  Component Value Date   HGBA1C 6.6 (A) 04/17/2023   .   PCP:  Jomarie Longs, PA-C    . Denies any other pedal complaints. Denies n/v/f/c.   Past Medical History:  Diagnosis Date   Ankylosing spondylitis ([name])    COPD (chronic obstructive pulmonary disease) ([name])    Diabetes mellitus without complication ([name])    High cholesterol    Hypertension    PTSD (post-traumatic stress disorder)     Objective:  Physical Exam: Vascular: DP/PT pulses 2/4 bilateral. CFT <3 seconds. Absent hair growth on digits. Edema noted to bilateral lower extremities. Xerosis noted bilaterally.  Skin. No lacerations or abrasions bilateral feet. Nails 1-5 bilateral  are thickened discolored and elongated with subungual debris. Multiple fissures noted to bilateral plantar feet. One in particular on the left nearly open. Ulceration noted to dorsum of right hallux about 1 cm in diameter has gotten deeper but does not probe to bone.  The right second digit ulceration has healed. Mores fissuring noted on the left foot today.  Musculoskeletal: MMT 5/5 bilateral lower extremities in DF, PF, Inversion and Eversion. Deceased ROM in DF of ankle joint. Hallux malleolus noted bilateral Neurological: Sensation intact to light touch. Protective sensation diminished bilateral.    Assessment:   1. Diabetic ulcer of toe of  right foot associated with type 2 diabetes mellitus, with fat layer exposed ([name])   2. Psoriasis         Plan:  Patient was evaluated and treated and all questions answered. Ulcer right hallux with fat layer exposed.  -X-ray negative for any osseous erosions. Hallux malleolus noted bilateral.  -Debridement as below. -Dressed with betadine, DSD. -continue Xeroform to cracked areas.  -Off-loading with surgical shoe. -Ammonium lactate prescribed for fissures. Alternate wit aquaphor twice daily  -Continue psoriasis injections.  -Cotninue gabapentin 200 mg BID -No abx indicated.  -Discussed glucose control and proper protein-rich diet.  -Discussed if any worsening redness, pain, fever or chills to call or may need to report to the emergency room. Patient expressed understanding.   Procedure: Excisional Debridement of Wound Rationale: Removal of non-viable soft tissue from the wound to promote healing.  Anesthesia: none Pre-Debridement Wound Measurements: overlying slough   Post-Debridement Wound Measurements: 0.3 cm x 0.1 cm x 0.2 cm (right hallux)  Type of Debridement: Sharp Excisional Tissue Removed: Non-viable soft tissue Depth of Debridement: subcutaneous tissue. Technique: Sharp excisional debridement to bleeding, viable wound base.  Dressing: Dry, sterile, compression dressing. Disposition: Patient tolerated procedure well. Patient to return in 2 week for follow-up.  Return in about 2 weeks (around 05/17/2023) for wound check.    Louann Sjogren, DPM

## 2023-05-09 ENCOUNTER — Ambulatory Visit: Payer: Commercial Managed Care - PPO | Admitting: Physician Assistant

## 2023-05-09 ENCOUNTER — Ambulatory Visit (INDEPENDENT_AMBULATORY_CARE_PROVIDER_SITE_OTHER): Payer: Commercial Managed Care - PPO | Admitting: Physician Assistant

## 2023-05-09 ENCOUNTER — Encounter: Payer: Self-pay | Admitting: Physician Assistant

## 2023-05-09 VITALS — BP 114/74 | HR 71 | Temp 97.8°F | Ht 71.0 in | Wt 221.0 lb

## 2023-05-09 DIAGNOSIS — L03115 Cellulitis of right lower limb: Secondary | ICD-10-CM | POA: Insufficient documentation

## 2023-05-09 NOTE — Patient Instructions (Signed)
Unna boot until Friday.

## 2023-05-09 NOTE — Progress Notes (Signed)
   Acute Office Visit  Subjective:     Patient ID: Robert Frey, male    DOB: 20-Nov-1960, 62 y.o.   MRN: [id number]  Chief Complaint  Patient presents with   Leg Swelling    Rt leg swelling    HPI Patient is in today for R leg cellulitis flare up. He has had cellulitis for 3 mo. He went to ED for R leg cellulitis 11/26, our office on 12/2 in which it looked better, and podiatry on 12/5. XR negative for osteomyelitis. He has been moisturizing. He says it has been oozing. He has concomitant psoriasis as well. He is taking he biologic. He wears a brace over the leg for support.   Review of Systems  Skin:  Positive for itching.      Objective:    BP 114/74   Pulse 71   Temp 97.8 F (36.6 C) (Oral)   Ht 5\' 11"  (1.803 m)   Wt 221 lb (100.2 kg)   SpO2 99%   BMI 30.82 kg/m  BP Readings from Last 3 Encounters:  05/09/23 114/74  04/30/23 (!) 104/59  04/24/23 138/84   Wt Readings from Last 3 Encounters:  05/09/23 221 lb (100.2 kg)  04/30/23 221 lb (100.2 kg)  04/24/23 223 lb 15.8 oz (101.6 kg)      Physical Exam Skin:    Findings: Erythema present.          Comments: Well demarcated area of erythema around anterior and posterior right calf and knee; oozing, but no purulence, warmth or swelling. It is tender to touch.         Assessment & Plan:  Marland KitchenMarland KitchenMikeal was seen today for leg swelling.  Diagnoses and all orders for this visit:  Cellulitis of right leg   Unna boot applied today in office. Advised patient he can still wear his brace. Wear unna boot until Friday. Return Friday to determine if [employer] helping. Vitals look great today, no fever.   Return in about 2 days (around 05/11/2023) for unna boot change.  Tandy Gaw, PA-C

## 2023-05-11 ENCOUNTER — Encounter: Payer: Self-pay | Admitting: Physician Assistant

## 2023-05-11 ENCOUNTER — Ambulatory Visit (INDEPENDENT_AMBULATORY_CARE_PROVIDER_SITE_OTHER): Payer: Commercial Managed Care - PPO | Admitting: Physician Assistant

## 2023-05-11 VITALS — BP 124/61 | HR 105 | Resp 12 | Ht 71.0 in | Wt 217.0 lb

## 2023-05-11 DIAGNOSIS — L03115 Cellulitis of right lower limb: Secondary | ICD-10-CM

## 2023-05-11 NOTE — Progress Notes (Signed)
Established Patient Office Visit  Subjective   Patient ID: Robert Frey, male    DOB: Oct 25, 1960  Age: 62 y.o. MRN: [id number]  Chief Complaint  Patient presents with   unna boot    Follow up    HPI Pt is a 62 yo male who presents to the clinic to follow up after unna boot placed 2 days ago for right anterior lower leg cellulitis. No fever, chills. No oozing or pain. Seems to be doing better.   Patient Active Problem List   Diagnosis Date Noted   Cellulitis of right leg 05/09/2023   Cellulitis of right foot 05/01/2023   Rash 05/01/2023   Drug reaction 04/30/2023   Diabetic ulcer of toe of right foot associated with type 2 diabetes mellitus, limited to breakdown of skin ([name]) 03/08/2023   Blister of plantar aspect of right foot 03/08/2023   Tinea pedis of both feet 02/28/2023   Heat exhaustion 12/05/2022   Dehydration 12/05/2022   Hyponatremia 12/05/2022   Dyslipidemia, goal LDL below 70 01/17/2022   Charcot's joint of right foot 08/03/2021   Syndesmotic disruption of right ankle 08/01/2021   Family history of blood clots 07/26/2021   Leg edema, right 07/26/2021   Right leg pain 07/26/2021   Absent pedal pulses 07/13/2021   Acute right ankle pain 07/13/2021   Diabetic foot infection ([name]) 06/28/2021   Psoriasis 06/28/2021   Acute left-sided low back pain without sciatica 06/07/2021   Chronic foot pain, right 05/03/2021   Rib pain on left side 03/07/2021   Fall 03/07/2021   Family history of Alzheimer's disease 11/09/2020   Diabetic neuropathy ([name]) 01/28/2020   Current smoker 10/22/2019   Nosebleed 10/22/2019   Primary osteoarthritis, right glenohumeral joint 07/02/2019   Shoulder separation, right, subsequent encounter 06/17/2019   Essential tremor 04/16/2019   Shakes 04/04/2019   Dark urine 01/01/2019   Lumbosacral strain 01/17/2017   Bruising 06/09/2015   Essential hypertension 06/08/2015   Microalbuminuria 06/08/2015   Lung nodule < 6cm on CT 05/15/2014    Heel spur 01/20/2014   Osteoarthritis of foot, left 01/20/2014   Psoriatic arthritis ([name]) 10/11/2012   Type 2 diabetes mellitus with microalbuminuria, with long-term current use of insulin ([name]) 10/11/2012   Ankylosing spondylitis of multiple sites in spine ([name]) 09/30/2009   HYPERCHOLESTEROLEMIA 03/06/2006   OBESITY, NOS 03/06/2006   OBSESSIVE COMPUL. DISORDER 03/06/2006   Past Medical History:  Diagnosis Date   Ankylosing spondylitis ([name])    COPD (chronic obstructive pulmonary disease) ([name])    Diabetes mellitus without complication ([name])    High cholesterol    Hypertension    PTSD (post-traumatic stress disorder)    Past Surgical History:  Procedure Laterality Date   APPENDECTOMY     CARPAL TUNNEL RELEASE Right    ent surgery     ORIF CALCANEOUS FRACTURE Right 08/01/2021   Procedure: OPEN REDUCTION INTERNAL FIXATION (ORIF) FOOT FRACTURE;  Surgeon: Myrene Galas, MD;  Location: [facility] OR;  Service: Orthopedics;  Laterality: Right;   TONSILLECTOMY AND ADENOIDECTOMY     Family History  Problem Relation Age of Onset   Stroke Other    Alcohol abuse Other    Depression Other    Hypertension Other    Hyperlipidemia Other    Diabetes Mother    Alzheimer's disease Mother    Diabetes Sister    Stroke Sister    Allergies  Allergen Reactions   Green Dyes Hives   Keflex [Cephalexin]     Whole  body rash   Propranolol     Nausea/vomiting   Latex Rash    Powder in gloves   Zithromax [Azithromycin Dihydrate] Rash      ROS See HPI.    Objective:     BP 124/61 (BP Location: Left Arm, Patient Position: Sitting)   Pulse (!) 105   Resp 12   Ht 5\' 11"  (1.803 m)   Wt 217 lb (98.4 kg)   SpO2 98%   BMI 30.27 kg/m  BP Readings from Last 3 Encounters:  05/11/23 124/61  05/09/23 114/74  04/30/23 (!) 104/59   Wt Readings from Last 3 Encounters:  05/11/23 217 lb (98.4 kg)  05/09/23 221 lb (100.2 kg)  04/30/23 221 lb (100.2 kg)      Physical Exam Right lower anterior  leg and knee:  No swelling. Ulcerations and open abrasions decreasing in size. No oozing. No warmth or tenderness.    The 10-year ASCVD risk score (Arnett DK, et al., 2019) is: 24.1%    Assessment & Plan:  .Elworth was seen today for [employer].  Diagnoses and all orders for this visit:  Cellulitis of right leg   Vitals look good Right leg improved a lot Wrap again with unna boot Take off 2-3 days Follow up if not resolved or worsening  Tandy Gaw, PA-C

## 2023-05-15 ENCOUNTER — Ambulatory Visit: Payer: Commercial Managed Care - PPO | Admitting: Behavioral Health

## 2023-05-15 ENCOUNTER — Encounter: Payer: Self-pay | Admitting: Behavioral Health

## 2023-05-15 DIAGNOSIS — F4310 Post-traumatic stress disorder, unspecified: Secondary | ICD-10-CM | POA: Diagnosis not present

## 2023-05-15 DIAGNOSIS — F411 Generalized anxiety disorder: Secondary | ICD-10-CM
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
## 2023-05-17 ENCOUNTER — Encounter: Payer: Self-pay | Admitting: Podiatry

## 2023-05-17 ENCOUNTER — Ambulatory Visit (INDEPENDENT_AMBULATORY_CARE_PROVIDER_SITE_OTHER): Payer: Commercial Managed Care - PPO | Admitting: Podiatry

## 2023-05-17 DIAGNOSIS — L84 Corns and callosities: Secondary | ICD-10-CM | POA: Diagnosis not present

## 2023-05-17 DIAGNOSIS — B351 Tinea unguium: Secondary | ICD-10-CM

## 2023-05-17 DIAGNOSIS — M79675 Pain in left toe(s): Secondary | ICD-10-CM

## 2023-05-17 DIAGNOSIS — L97512 Non-pressure chronic ulcer of other part of right foot with fat layer exposed: Secondary | ICD-10-CM

## 2023-05-17 DIAGNOSIS — E1142 Type 2 diabetes mellitus with diabetic polyneuropathy: Secondary | ICD-10-CM | POA: Diagnosis not present

## 2023-05-17 DIAGNOSIS — M79674 Pain in right toe(s): Secondary | ICD-10-CM

## 2023-05-17 DIAGNOSIS — E11621 Type 2 diabetes mellitus with foot ulcer: Secondary | ICD-10-CM

## 2023-05-17 NOTE — Progress Notes (Signed)
  Subjective:  Patient ID: JERAMEY WEINHARDT, male    DOB: April 08, 1961,   MRN: [id number]  Chief Complaint  Patient presents with   Foot Ulcer    Pt presents for follow-up of left foot wound. States he has  has been dressing as he was told  The toe is about the same but  the bottom  have been improving.       62 y.o. male presents for  follow-up of left foot wound. Relates has been dressing as instructed. Patient has a history of psoriasis and had been treating this.  Relates doing better but still painful. Relates burning and tingling in their feet. Patient is diabetic and last A1c was  Lab Results  Component Value Date   HGBA1C 6.6 (A) 04/17/2023   .   PCP:  Jomarie Longs, PA-C    . Denies any other pedal complaints. Denies n/v/f/c.   Past Medical History:  Diagnosis Date   Ankylosing spondylitis ([name])    COPD (chronic obstructive pulmonary disease) ([name])    Diabetes mellitus without complication ([name])    High cholesterol    Hypertension    PTSD (post-traumatic stress disorder)     Objective:  Physical Exam: Vascular: DP/PT pulses 2/4 bilateral. CFT <3 seconds. Absent hair growth on digits. Edema noted to bilateral lower extremities. Xerosis noted bilaterally.  Skin. No lacerations or abrasions bilateral feet. Nails 1-5 bilateral  are thickened discolored and elongated with subungual debris. Multiple fissures noted to bilateral plantar feet. One in particular on the left nearly open. Ulceration noted to dorsum of right hallux healed The right second digit ulceration has healed. Mores fissuring noted on the left foot today.  Musculoskeletal: MMT 5/5 bilateral lower extremities in DF, PF, Inversion and Eversion. Deceased ROM in DF of ankle joint. Hallux malleolus noted bilateral Neurological: Sensation intact to light touch. Protective sensation diminished bilateral.    Assessment:   1. Diabetic ulcer of toe of right foot associated with type 2 diabetes mellitus, with fat layer  exposed ([name])   2. Diabetic polyneuropathy associated with type 2 diabetes mellitus ([name])   3. Pre-ulcerative calluses   4. Pain due to onychomycosis of toenails of both feet         Plan:  Patient was evaluated and treated and all questions answered. Ulcer right hallux -healed.  -X-ray negative for any osseous erosions. Hallux malleolus noted bilateral.  -Debridement  of hyperkeratotic tissue with underlying ulceration healed.  -Dressed with bandaid for protection.  -continue Xeroform to cracked areas.  -Ammonium lactate prescribed for fissures. Alternate wit aquaphor twice daily  -Continue psoriasis injections.  -Cotninue gabapentin 200 mg BID -No abx indicated.  -Discussed and educated patient on diabetic foot care, especially with  regards to the vascular, neurological and musculoskeletal systems.  -Stressed the importance of good glycemic control and the detriment of not  controlling glucose levels in relation to the foot. -Discussed supportive shoes at all times and checking feet regularly.  -Mechanically debrided all nails 1-5 bilateral using sterile nail nipper and filed with dremel without incident    Return in 4 weeks for recheck of wound.   Return in about 4 weeks (around 06/14/2023) for wound check.    Louann Sjogren, DPM

## 2023-06-04 ENCOUNTER — Other Ambulatory Visit: Payer: Self-pay | Admitting: Physician Assistant

## 2023-06-04 DIAGNOSIS — R21 Rash and other nonspecific skin eruption: Secondary | ICD-10-CM

## 2023-06-04 DIAGNOSIS — T50905A Adverse effect of unspecified drugs, medicaments and biological substances, initial encounter: Secondary | ICD-10-CM

## 2023-06-12 ENCOUNTER — Ambulatory Visit: Payer: Commercial Managed Care - PPO | Admitting: Behavioral Health

## 2023-06-12 ENCOUNTER — Encounter: Payer: Self-pay | Admitting: Behavioral Health

## 2023-06-12 DIAGNOSIS — F4310 Post-traumatic stress disorder, unspecified: Secondary | ICD-10-CM | POA: Diagnosis not present

## 2023-06-12 NOTE — Progress Notes (Signed)
 [facility] Counselor/Therapist Progress Note  Patient ID: Robert Frey, MRN: [id number],    Date: 06/12/2023  Time Spent: 8:00 AM to 9:00 a.m., 60   minutes spent in person with the patient in outpatient therapy office.    Treatment Type: Individual Therapy  Reported Symptoms: Generalized anxiety disorder/stress, patient having repeated weird and/or bad dreams related to his past.  Mental Status Exam: Appearance:  Fairly Groomed     Behavior: Appropriate  Motor: Normal  Speech/Language:  Clear and Coherent  Affect: Appropriate  Mood: sad  Thought process: normal  Thought content:   WNL  Sensory/Perceptual disturbances:   WNL  Orientation: oriented to person, place, time/date, situation, day of week, and month of year  Attention: Good  Concentration: Good  Memory: WNL  Fund of knowledge:  Good  Insight:   Good  Judgment:  Good  Impulse Control: Good   Risk Assessment: Danger to Self:  No Self-injurious Behavior: No Danger to Others: No Duty to Warn:no Physical Aggression / Violence:No  Access to Firearms a concern: No  Gang Involvement:No   Subjective: The issue that has affected his scan and him itching terribly has not gotten better.  He did get an injection yesterday of something that had not tried yet and his rheumatologist is hopeful that will help.  It is itching so badly that he has to constantly put lotion on and cannot work.  He said it is affecting 90% of his body.  He wanted to quit work but cannot now because he needs insurance.  The patient is affecting his sleep.  He is still having weird dreams.  He had one this week where he was battling aliens.  In most of his dreams he is either fighting against someone somehow or trying to get away from someone who is trying to hurt him.  Thank consistent theme is that he is always hypervigilant and he is most of the time trying to protect his mother.  He reports no specific interaction between he  and his mother but she is always there.  He is usually younger like in his 30s or 30s and she is usually older but not as old as she was when she died at 73.  We started to look at more of his relationship with his mother.  Even though he feels she did not protect him because of her on trauma and mental health issues he has always tried to protect her and sees how that is playing out in his dreams.  He knows that he gave up things that he probably would have been qualified to do because he had to work and take care of his mother a lot and therefore could not go to college.  He acknowledges some resentment with that.  We continue to process why he feels that he may be holding onto feeling like why he has to be a protector or either hypervigilant.  We talked about what letting go would feel like for the patient.  He recently found out that the boy who bullied him constantly growing up had died about 8 years ago of cancer.  He had no contact with him since he left home but said the bullying was so intense that he felt no regret or grief or anything for that person.  Spite of how badly he has felt because of the itching he said he has not been tempted to drink..  We talked about some  coping skills that he may be able to use as a distraction since he cannot go fishing right now. The patient does contract for safety having no thoughts of hurting himself or anyone else.  Interventions: Cognitive Behavioral Therapy and Dialectical Behavioral Therapy  Diagnosis:PTSD  Plan: I will meet with the patient every 2 to 3 weeks in person. Treatment plan: We will use cognitive behavioral therapy principles as well as elements of dialectical behavior therapy to reduce depression and anxiety at least 50% over the next 6 months.  Target date will be March 29, 2023.  Goals for depression are to have less sadness as indicated by patient report and PH-9 score, to have improved mood and return to a healthier level of  functioning and to identify historical or current causes for depressed mood and learn how to cope with the depression.  Interventions will be to use CBT to explore and replace unhealthy thoughts and behavior patterns and encouraged sharing of feelings related to causes of symptoms of depression including dreams and history, teach and encouraged the use of coping skills for managing depressive symptoms.  Goals for reducing anxiety/stress are to improve his ability to manage anxiety symptoms and better handle stress, identify causes for anxiety and stress to manage thoughts and worrisome thinking contributing to feelings of anxiety.  We will facilitate solution skills for helping identify stressors, teach coping skills for reducing anxiety and stress and use cognitive behavioral therapy to identify and change anxiety producing thoughts and behavior patterns.  Lastly we will use DBT distress tolerance and mindfulness skills  Progress: 35%.  I reviewed the treatment plans as listed above with the patient and he agreed to continue with those goals.  New target date will be September 26, 2023. Lorrene CHRISTELLA Hasten, [facility]                                                             Lorrene CHRISTELLA Hasten, [facility]               Lorrene CHRISTELLA Hasten, [facility]               Lorrene CHRISTELLA Hasten, [facility]               Lorrene CHRISTELLA Hasten, [facility]          Lorrene CHRISTELLA Hasten, [facility]               Lorrene CHRISTELLA Hasten, [facility]               Lorrene CHRISTELLA Hasten, [facility]               Lorrene CHRISTELLA Hasten, [facility]               Lorrene CHRISTELLA Hasten, [facility]

## 2023-06-14 ENCOUNTER — Encounter: Payer: Self-pay | Admitting: Podiatry

## 2023-06-14 ENCOUNTER — Ambulatory Visit (INDEPENDENT_AMBULATORY_CARE_PROVIDER_SITE_OTHER): Payer: Commercial Managed Care - PPO | Admitting: Podiatry

## 2023-06-14 DIAGNOSIS — L84 Corns and callosities: Secondary | ICD-10-CM | POA: Diagnosis not present

## 2023-06-14 DIAGNOSIS — E1142 Type 2 diabetes mellitus with diabetic polyneuropathy: Secondary | ICD-10-CM

## 2023-06-14 NOTE — Progress Notes (Signed)
  Subjective:  Patient ID: Robert Frey, male    DOB: [birth year],   MRN: [id number]  Chief Complaint  Patient presents with   Psoriasis    Pt presents for follow-up of psoriasis of both feet states that he don't think he is doing any better.    63 y.o. male presents for  follow-up of left foot wound. Relates has been doing well. His psoriasis has been acting up. Patient has a history of psoriasis and had been treating this.  Relates burning and tingling in their feet. Patient is diabetic and last A1c was  Lab Results  Component Value Date   HGBA1C 6.6 (A) 04/17/2023   .   PCP:  Jomarie Longs, PA-C    . Denies any other pedal complaints. Denies n/v/f/c.   Past Medical History:  Diagnosis Date   Ankylosing spondylitis ([name])    COPD (chronic obstructive pulmonary disease) ([name])    Diabetes mellitus without complication ([name])    High cholesterol    Hypertension    PTSD (post-traumatic stress disorder)     Objective:  Physical Exam: Vascular: DP/PT pulses 2/4 bilateral. CFT <3 seconds. Absent hair growth on digits. Edema noted to bilateral lower extremities. Xerosis noted bilaterally.  Skin. No lacerations or abrasions bilateral feet. Nails 1-5 bilateral  are thickened discolored and elongated with subungual debris. Multiple fissures noted to bilateral plantar feet. One in particular on the left nearly open. Ulceration noted to dorsum of right hallux healed The right second digit ulceration has healed. Mores fissuring noted on the left foot today.  Musculoskeletal: MMT 5/5 bilateral lower extremities in DF, PF, Inversion and Eversion. Deceased ROM in DF of ankle joint. Hallux malleolus noted bilateral Neurological: Sensation intact to light touch. Protective sensation diminished bilateral.    Assessment:   1. Pre-ulcerative calluses   2. Diabetic polyneuropathy associated with type 2 diabetes mellitus ([name])          Plan:  Patient was evaluated and treated and all  questions answered. Ulcer right hallux -healed.  -X-ray negative for any osseous erosions. Hallux malleolus noted bilateral.  -Debridement  of hyperkeratotic tissue with underlying ulceration healed.  -continue Xeroform to cracked areas.  -Continue ammounium lactate.  Alternate wit aquaphor twice daily  -Continue psoriasis injections.  -Cotninue gabapentin 200 mg BID -No abx indicated.  -Discussed and educated patient on diabetic foot care, especially with  regards to the vascular, neurological and musculoskeletal systems.  -Stressed the importance of good glycemic control and the detriment of not  controlling glucose levels in relation to the foot. -Discussed supportive shoes at all times and checking feet regularly.   Returnin 3 months for rfc. Marland Kitchen   Return in about 3 months (around 09/12/2023) for rfc.    Louann Sjogren, DPM

## 2023-06-25 ENCOUNTER — Other Ambulatory Visit: Payer: Self-pay

## 2023-06-25 ENCOUNTER — Telehealth: Payer: Self-pay

## 2023-06-25 ENCOUNTER — Other Ambulatory Visit: Payer: Self-pay | Admitting: Physician Assistant

## 2023-06-25 DIAGNOSIS — E1129 Type 2 diabetes mellitus with other diabetic kidney complication: Secondary | ICD-10-CM

## 2023-06-25 MED ORDER — TOUJEO MAX SOLOSTAR 300 UNIT/ML ~~LOC~~ SOPN: 300.0000 [IU] | PEN_INJECTOR | SUBCUTANEOUS | 0 refills | Status: DC

## 2023-06-25 NOTE — Telephone Encounter (Signed)
Removed xigduo10-1,000mg  once daily  form patient current med list  Added segluromet  2.5-500mg  BID to current med list  Was changed as recommended alternative with [facility] and approval of change was made by Abran Richard Patient informed of med change as well of need to take new medication BID .

## 2023-06-29 ENCOUNTER — Telehealth: Payer: Self-pay

## 2023-06-29 NOTE — Telephone Encounter (Signed)
Copied from CRM [id number]. Topic: Clinical - Prescription Issue >> Jun 29, 2023 12:45 PM Shelah Lewandowsky wrote: Reason for CRM: checking in to see if the pharmacy reached out about a replacement for insulin glargine, 2 Unit Dial, (TOUJEO MAX SOLOSTAR) 300 UNIT/ML Solostar Pen due to insurance coverage change, please call patient [phone]

## 2023-07-02 NOTE — Telephone Encounter (Signed)
 Patient informed.

## 2023-07-02 NOTE — Telephone Encounter (Signed)
Patient states he was injecting Toujeo twice daily - 7units in a.m. and 7units  in p.m. to total 14 unites per day .

## 2023-07-07 ENCOUNTER — Other Ambulatory Visit: Payer: Self-pay | Admitting: Physician Assistant

## 2023-07-07 DIAGNOSIS — E1129 Type 2 diabetes mellitus with other diabetic kidney complication: Secondary | ICD-10-CM

## 2023-07-09 ENCOUNTER — Ambulatory Visit: Payer: Commercial Managed Care - PPO | Admitting: Behavioral Health

## 2023-07-09 ENCOUNTER — Encounter: Payer: Self-pay | Admitting: Behavioral Health

## 2023-07-09 DIAGNOSIS — F4310 Post-traumatic stress disorder, unspecified: Secondary | ICD-10-CM | POA: Diagnosis not present

## 2023-07-09 NOTE — Progress Notes (Signed)
 [facility] Counselor/Therapist Progress Note  Patient ID: Robert Frey, MRN: [id number],    Date: 07/09/2023  Time Spent: 8:00 AM to 8:58 a.m., 58   minutes spent in person with the patient in outpatient therapy office.    Treatment Type: Individual Therapy  Reported Symptoms: Generalized anxiety disorder/stress, patient having repeated weird and/or bad dreams related to his past.  Mental Status Exam: Appearance:  Fairly Groomed     Behavior: Appropriate  Motor: Normal  Speech/Language:  Clear and Coherent  Affect: Appropriate  Mood: sad  Thought process: normal  Thought content:   WNL  Sensory/Perceptual disturbances:   WNL  Orientation: oriented to person, place, time/date, situation, day of week, and month of year  Attention: Good  Concentration: Good  Memory: WNL  Fund of knowledge:  Good  Insight:   Good  Judgment:  Good  Impulse Control: Good   Risk Assessment: Danger to Self:  No Self-injurious Behavior: No Danger to Others: No Duty to Warn:no Physical Aggression / Violence:No  Access to Firearms a concern: No  Gang Involvement:No   Subjective: The patient psoriasis has gotten so bad that it is over all of his body now.  Nothing that he has tried so far as help so he is meeting with a dermatologist later today.  It is affecting his sleep.  He is affecting his work.  He is drawing blood because he is scratching so much but it does not even bring short amount of relief.  We begin to process more of his past related to his dreams.  In recent dreams continues to protect his mom and is always fighting against something.  He 1 particular dream they were to what he saw as the girls who he saw as an evil and they question whether there was a gun.  He told them that was a guide and when they ask him to prove that he held her hand and pray for them and they were healed and they believed.  We talked about how that affects him spiritually.  We also  looked at his dreams as always protecting his mom because that is what he always had to do.  Even after she died he still felt some guilt because he felt he had done what he could but it was not enough.  We began to talk about the letting go of the fact that he had not done enough and what that begins to look like.  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  He said that it was always once the next meal coming from her what can we do financially.  He remembers people coming to take furniture back that his mother could not pay for her having to hustle people in a game of pool to have enough money to pay for food or fishing just to have something to eat.  We will continue to process this past week building coping skills at the end of the session as a way of calming. The patient does contract for safety having no thoughts of hurting himself or anyone else.  Interventions: Cognitive Behavioral Therapy and Dialectical Behavioral Therapy  Diagnosis:PTSD  Plan: I will meet with the patient every 2 to 3 weeks in person. Treatment plan: We will use cognitive behavioral therapy principles as well as elements of  dialectical behavior therapy to reduce depression and anxiety at least 50% over the next 6 months.  Target date will be March 29, 2023.  Goals for depression are to have less sadness as indicated by patient report and PH-9 score, to have improved mood and return to a healthier level of functioning and to identify historical or current causes for depressed mood and learn how to cope with the depression.  Interventions will be to use CBT to explore and replace unhealthy thoughts and behavior patterns and encouraged sharing of feelings related to causes of symptoms of depression including dreams and history, teach and encouraged the use of coping skills for managing depressive symptoms.   Goals for reducing anxiety/stress are to improve his ability to manage anxiety symptoms and better handle stress, identify causes for anxiety and stress to manage thoughts and worrisome thinking contributing to feelings of anxiety.  We will facilitate solution skills for helping identify stressors, teach coping skills for reducing anxiety and stress and use cognitive behavioral therapy to identify and change anxiety producing thoughts and behavior patterns.  Lastly we will use DBT distress tolerance and mindfulness skills  Progress: 35%.  I reviewed the treatment plans as listed above with the patient and he agreed to continue with those goals.  New target date will be September 26, 2023. Cecile Coder, [facility]                                                             Cecile Coder, [facility]               Cecile Coder, [facility]               Cecile Coder, [facility]               Cecile Coder, [facility]          Cecile Coder, [facility]               Cecile Coder, [facility]               Cecile Coder, [facility]               Cecile Coder, [facility]               Cecile Coder, [facility]               Cecile Coder, [facility]

## 2023-07-15 ENCOUNTER — Emergency Department (HOSPITAL_BASED_OUTPATIENT_CLINIC_OR_DEPARTMENT_OTHER): Payer: Commercial Managed Care - PPO

## 2023-07-15 ENCOUNTER — Encounter (HOSPITAL_BASED_OUTPATIENT_CLINIC_OR_DEPARTMENT_OTHER): Payer: Self-pay | Admitting: Urology

## 2023-07-15 ENCOUNTER — Other Ambulatory Visit: Payer: Self-pay

## 2023-07-15 ENCOUNTER — Emergency Department (HOSPITAL_BASED_OUTPATIENT_CLINIC_OR_DEPARTMENT_OTHER)
Admission: EM | Admit: 2023-07-15 | Discharge: 2023-07-16 | Disposition: A | Discharge status: Other Acute Inpatient Hospital | Payer: Commercial Managed Care - PPO | Attending: Emergency Medicine | Admitting: Emergency Medicine

## 2023-07-15 DIAGNOSIS — L539 Erythematous condition, unspecified: Secondary | ICD-10-CM | POA: Insufficient documentation

## 2023-07-15 DIAGNOSIS — L309 Dermatitis, unspecified: Secondary | ICD-10-CM | POA: Diagnosis present

## 2023-07-15 LAB — CBC WITH DIFFERENTIAL/PLATELET
Abs Immature Granulocytes: 0.03 10*3/uL (ref 0.00–0.07)
Basophils Absolute: 0 10*3/uL (ref 0.0–0.1)
Basophils Relative: 0 %
Eosinophils Absolute: 0.7 10*3/uL — ABNORMAL HIGH (ref 0.0–0.5)
Eosinophils Relative: 11 %
HCT: 35.4 % — ABNORMAL LOW (ref 39.0–52.0)
Hemoglobin: 12.5 g/dL — ABNORMAL LOW (ref 13.0–17.0)
Immature Granulocytes: 0 %
Lymphocytes Relative: 20 %
Lymphs Abs: 1.4 10*3/uL (ref 0.7–4.0)
MCH: 32 pg (ref 26.0–34.0)
MCHC: 35.3 g/dL (ref 30.0–36.0)
MCV: 90.5 fL (ref 80.0–100.0)
Monocytes Absolute: 1 10*3/uL (ref 0.1–1.0)
Monocytes Relative: 14 %
Neutro Abs: 3.7 10*3/uL (ref 1.7–7.7)
Neutrophils Relative %: 55 %
Platelets: 270 10*3/uL (ref 150–400)
RBC: 3.91 MIL/uL — ABNORMAL LOW (ref 4.22–5.81)
RDW: 16 % — ABNORMAL HIGH (ref 11.5–15.5)
WBC: 6.8 10*3/uL (ref 4.0–10.5)
nRBC: 0 % (ref 0.0–0.2)

## 2023-07-15 LAB — COMPREHENSIVE METABOLIC PANEL
ALT: 22 U/L (ref 0–44)
AST: 29 U/L (ref 15–41)
Albumin: 3.6 g/dL (ref 3.5–5.0)
Alkaline Phosphatase: 57 U/L (ref 38–126)
Anion gap: 13 (ref 5–15)
BUN: 18 mg/dL (ref 6–20)
CO2: 18 mmol/L — ABNORMAL LOW (ref 22–32)
Calcium: 8.4 mg/dL — ABNORMAL LOW (ref 8.9–10.3)
Chloride: 100 mmol/L (ref 98–111)
Creatinine, Ser: 1.35 mg/dL — ABNORMAL HIGH (ref 0.61–1.24)
GFR, Estimated: >60 mL/min (ref >60)
Glucose, Bld: 141 mg/dL — ABNORMAL HIGH (ref 70–99)
Potassium: 4.8 mmol/L (ref 3.5–5.1)
Sodium: 131 mmol/L — ABNORMAL LOW (ref 135–145)
Total Bilirubin: 0.7 mg/dL (ref 0.0–1.2)
Total Protein: 6.7 g/dL (ref 6.5–8.1)

## 2023-07-15 LAB — URINALYSIS, W/ REFLEX TO CULTURE (INFECTION SUSPECTED)
Bilirubin Urine: NEGATIVE
Glucose, UA: >=500 mg/dL — AB
Hgb urine dipstick: NEGATIVE
Ketones, ur: NEGATIVE mg/dL
Leukocytes,Ua: NEGATIVE
Nitrite: NEGATIVE
Protein, ur: NEGATIVE mg/dL
RBC / HPF: NONE SEEN RBC/hpf (ref 0–5)
Specific Gravity, Urine: 1.025 (ref 1.005–1.030)
pH: 5 (ref 5.0–8.0)

## 2023-07-15 LAB — PROTIME-INR
INR: 1 (ref 0.8–1.2)
Prothrombin Time: 13.5 s (ref 11.4–15.2)

## 2023-07-15 LAB — LACTIC ACID, PLASMA
Lactic Acid, Venous: 3.3 mmol/L (ref 0.5–1.9)
Lactic Acid, Venous: 2 mmol/L (ref 0.5–1.9)

## 2023-07-15 MED ORDER — SODIUM CHLORIDE 0.9 % IV SOLN
2.0000 g | Freq: Once | INTRAVENOUS | Status: AC
  Administered 2023-07-15: 2 g via INTRAVENOUS

## 2023-07-15 MED ORDER — DIPHENHYDRAMINE HCL 50 MG/ML IJ SOLN
50.0000 mg | Freq: Once | INTRAMUSCULAR | Status: AC
  Administered 2023-07-15: 50 mg via INTRAVENOUS
  Filled 2023-07-15: qty 1

## 2023-07-15 MED ORDER — VANCOMYCIN HCL IN DEXTROSE 1-5 GM/200ML-% IV SOLN
1000.0000 mg | Freq: Once | INTRAVENOUS | Status: AC
  Administered 2023-07-15: 1000 mg via INTRAVENOUS
  Filled 2023-07-15: qty 200

## 2023-07-15 MED ORDER — LACTATED RINGERS IV BOLUS (SEPSIS)
1000.0000 mL | Freq: Once | INTRAVENOUS | Status: AC
  Administered 2023-07-15: 1000 mL via INTRAVENOUS

## 2023-07-15 MED ORDER — HYDROMORPHONE HCL 1 MG/ML IJ SOLN
0.5000 mg | INTRAMUSCULAR | Status: AC | PRN
  Administered 2023-07-15 – 2023-07-16 (×3): 0.5 mg via INTRAVENOUS
  Filled 2023-07-15 (×3): qty 1

## 2023-07-15 MED ORDER — LACTATED RINGERS IV SOLN: INTRAVENOUS | Status: DC

## 2023-07-15 NOTE — ED Triage Notes (Signed)
Pt scoriasis all over body x months  States no rash is weeping  Been using aquaphor with no relief  Pt very SOB with exertion    BP low and HR elevated

## 2023-07-15 NOTE — ED Provider Notes (Signed)
[place] [facility] Provider Note   CSN: [id number] Arrival date & time: 07/15/23  1934     History Chief Complaint  Patient presents with   Dermatitis    HPI Robert Frey is a 63 y.o. male presenting for sudden onset full-body rash. He progressed over the last 12 hours. He is a 63 year old male with a history of multiple comorbid medical conditions including psoriasis on immunotherapy. States that he always has some psoriatic lesions but over the last 12 hours they rapidly progressed.  He denies fevers chills nausea vomiting syncope shortness of breath.  He is otherwise ambulatory tolerating p.o. intake on arrival. Patient's recorded medical, surgical, social, medication list and allergies were reviewed in the Snapshot window as part of the initial history.   Review of Systems   Review of Systems  Constitutional:  Positive for fever. Negative for chills.  HENT:  Negative for ear pain and sore throat.   Eyes:  Negative for pain and visual disturbance.  Respiratory:  Negative for cough and shortness of breath.   Cardiovascular:  Negative for chest pain and palpitations.  Gastrointestinal:  Negative for abdominal pain and vomiting.  Genitourinary:  Negative for dysuria and hematuria.  Musculoskeletal:  Negative for arthralgias and back pain.  Skin:  Positive for rash. Negative for color change.  Neurological:  Positive for weakness. Negative for seizures and syncope.  All other systems reviewed and are negative.   Physical Exam Updated Vital Signs BP 125/60   Pulse 100   Temp 97.8 F (36.6 C)   Resp 19   Ht 5\' 11"  (1.803 m)   Wt 98.4 kg   SpO2 98%   BMI 30.26 kg/m  Physical Exam Vitals and nursing note reviewed.  Constitutional:      General: He is not in acute distress.    Appearance: He is well-developed.  HENT:     Head: Normocephalic and atraumatic.  Eyes:     Conjunctiva/sclera: Conjunctivae normal.  Cardiovascular:      Rate and Rhythm: Normal rate and regular rhythm.     Heart sounds: No murmur heard. Pulmonary:     Effort: Pulmonary effort is normal. No respiratory distress.     Breath sounds: Normal breath sounds.  Abdominal:     Palpations: Abdomen is soft.     Tenderness: There is no abdominal tenderness.  Musculoskeletal:        General: No swelling.     Cervical back: Neck supple.  Skin:    General: Skin is warm and dry.     Capillary Refill: Capillary refill takes less than 2 seconds.     Findings: Erythema, lesion and rash present.     Comments: Confluent 85% body surface erythema. Very tender to palpation.  Burning sensation per patient.  Neurological:     Mental Status: He is alert.  Psychiatric:        Mood and Affect: Mood normal.      ED Course/ Medical Decision Making/ A&P    Procedures .Critical Care  Performed by: Glyn Ade, MD Authorized by: Glyn Ade, MD   Critical care provider statement:    Critical care time (minutes):  95   Critical care was necessary to treat or prevent imminent or life-threatening deterioration of the following conditions:  Shock and sepsis   Critical care was time spent personally by me on the following activities:  Development of treatment plan with patient or surrogate, discussions with consultants, evaluation of  patient's response to treatment, examination of patient, ordering and review of laboratory studies, ordering and review of radiographic studies, ordering and performing treatments and interventions, pulse oximetry, re-evaluation of patient's condition and review of old charts   Care discussed with: accepting provider at another facility      Medications Ordered in ED Medications  lactated ringers infusion ( Intravenous New Bag/Given 07/15/23 2315)  HYDROmorphone (DILAUDID) injection 0.5 mg (0.5 mg Intravenous Given 07/15/23 2303)  aztreonam (AZACTAM) 2 g in sodium chloride 0.9 % 100 mL IVPB (0 g Intravenous Stopped  07/15/23 2134)  vancomycin (VANCOCIN) IVPB 1000 mg/200 mL premix (0 mg Intravenous Stopped 07/15/23 2303)  lactated ringers bolus 1,000 mL (0 mLs Intravenous Stopped 07/15/23 2303)    And  lactated ringers bolus 1,000 mL (0 mLs Intravenous Stopped 07/15/23 2144)    And  lactated ringers bolus 1,000 mL (0 mLs Intravenous Stopped 07/15/23 2144)  diphenhydrAMINE (BENADRYL) injection 50 mg (50 mg Intravenous Given 07/15/23 2142)  Medical Decision Making:   Robert Frey is a 63 y.o. male who presented to the ED today with multiple symptoms detailed above.    Additional history discussed with patient's family/caregivers.  Patient placed on continuous vitals and telemetry monitoring while in ED which was reviewed periodically.  Complete initial physical exam performed, notably the patient  was ill-appearing. During this initial exam, patient met criteria for activation of code sepsis due to presence of the following SIRS criteria as well as suspected infectious etiology:tachypnea,tachycardia, Reviewed and confirmed nursing documentation for past medical history, family history, social history.    Initial Assessment:   With the patient's presentation of signs and symptoms of sepsis, most likely diagnosis is bacteremia secondary to underlying infection.  Considerations for source were initiated including:Urinary tract infections, abdominal infections such as cholecystitis/cholangitis/appendicitis, pulmonary etiology, bacteremia, skin etiology such as cellulitis or fasciitis, neurologic etiology such as meningitis or encephalitis.  This is most consistent with an acute life/limb threatening illness complicated by underlying chronic conditions.  Initial Plan:  Activated hospital protocol code sepsis including blood cultures, lactic acid screening, and further diagnostic care and management. Therapeutically, resuscitation fluids were considered. Patient has no contraindication to fluid resuscitation and  therefore 30 cc of IV fluids per kilogram were administered Therapeutically, antibiotics were administered on a broad-spectrum nature. Undifferentiated source: Vancomycin and aztreonam secondary to cephalosporin allergy were administered to cover gram-positive and gram-negative high risk infections  Screening labs including CBC and Metabolic panel to evaluate for infectious or metabolic etiology of disease.  Urinalysis with reflex culture ordered to evaluate for UTI or relevant urologic/nephrologic pathology.  CXR to evaluate for structural/infectious intrathoracic pathology.  EKG to evaluate for cardiac pathology Objective evaluation as below reviewed   Initial Study Results:   Laboratory  All laboratory results reviewed without evidence of clinically relevant pathology.   Exceptions include: Lactic acid elevation  KG EKG was reviewed independently. Rate, rhythm, axis, intervals all examined and without medically relevant abnormality. ST segments without concerns for elevations.    Radiology:  All images reviewed independently. Agree with radiology report at this time.   DG Chest Port 1 View Result Date: 07/15/2023 CLINICAL DATA:  [id number] Sepsis [facility]) [id number] EXAM: PORTABLE CHEST 1 VIEW COMPARISON:  03/01/2021 FINDINGS: The heart size and mediastinal contours are within normal limits. Aortic atherosclerosis. No focal airspace consolidation, pleural effusion, or pneumothorax. The visualized skeletal structures are unremarkable. IMPRESSION: No active disease. Electronically Signed   By: Duanne Guess D.O.   On: 07/15/2023 20:40  Consults: Case discussed with dermatology Dr. Adriana Simas at [facility].  They agreed with likely need for inpatient management. This is outside the scope of [facility] as we do not have acute dermatology consult. Consulted medicine provider at [facility] who agreed with treatment and accepted patient to [facility] for virtual dermatology  consultation.   Final Assessment and Plan:   Patient's history of present illness and physical exam findings are most consistent with acute erythroderma.  While I suspect infectious, it may be all autoimmune and inflammatory.  Dermatology stated that there were multiple inpatient treatment options for acute psoriasis or erythroderma but needed to see the patient in person to make that recommendation.  Patient arranged for transfer to [facility].  Disposition:   Based on the above findings, I believe this patient is stable for admission.    Patient/family educated about specific findings on our evaluation and explained exact reasons for admission.  Patient/family educated about clinical situation and time was allowed to answer questions.   Admission team communicated with and agreed with need for admission. Patient admitted. Patient ready to move at this time.     Emergency Department Medication Summary:   Medications  lactated ringers infusion ( Intravenous New Bag/Given 07/15/23 2315)  HYDROmorphone (DILAUDID) injection 0.5 mg (0.5 mg Intravenous Given 07/15/23 2303)  aztreonam (AZACTAM) 2 g in sodium chloride 0.9 % 100 mL IVPB (0 g Intravenous Stopped 07/15/23 2134)  vancomycin (VANCOCIN) IVPB 1000 mg/200 mL premix (0 mg Intravenous Stopped 07/15/23 2303)  lactated ringers bolus 1,000 mL (0 mLs Intravenous Stopped 07/15/23 2303)    And  lactated ringers bolus 1,000 mL (0 mLs Intravenous Stopped 07/15/23 2144)    And  lactated ringers bolus 1,000 mL (0 mLs Intravenous Stopped 07/15/23 2144)  diphenhydrAMINE (BENADRYL) injection 50 mg (50 mg Intravenous Given 07/15/23 2142)          Clinical Impression:  1. Erythroderma      Transfer via Transport   Clinical Impression:  1. Erythroderma      Transfer via Transport   Final Clinical Impression(s) / ED Diagnoses Final diagnoses:  Erythroderma    Rx / DC Orders ED Discharge Orders     None          Glyn Ade, MD 07/15/23 2324

## 2023-07-15 NOTE — ED Notes (Signed)
Dr. Doran Durand aware of lactic 3.3.

## 2023-07-16 ENCOUNTER — Encounter: Payer: Self-pay | Admitting: Podiatry

## 2023-07-16 DIAGNOSIS — L539 Erythematous condition, unspecified: Secondary | ICD-10-CM | POA: Diagnosis not present

## 2023-07-16 DIAGNOSIS — L309 Dermatitis, unspecified: Secondary | ICD-10-CM | POA: Diagnosis present

## 2023-07-16 NOTE — ED Notes (Signed)
 [facility] called for transport.

## 2023-07-16 NOTE — ED Notes (Signed)
[facility] on the unit, paperwork and report given, pt belongings sent with pt

## 2023-07-18 ENCOUNTER — Ambulatory Visit: Payer: Commercial Managed Care - PPO | Admitting: Physician Assistant

## 2023-07-19 ENCOUNTER — Telehealth: Payer: Self-pay

## 2023-07-19 NOTE — Transitions of Care (Post Inpatient/ED Visit) (Signed)
07/19/2023  Name: Robert Frey MRN: [id number] DOB: 18-May-1961  Today's TOC FU Call Status: Today's TOC FU Call Status:: Successful TOC FU Call Completed TOC FU Call Complete Date: 07/19/23 Patient's Name and Date of Birth confirmed.  Transition Care Management Follow-up Telephone Call Date of Discharge: 07/18/23 Discharge Facility: [facility] Type of Discharge: Inpatient Admission Primary Inpatient Discharge Diagnosis:: erthematous How have you been since you were released from the hospital?: Better Any questions or concerns?: No  Items Reviewed: Did you receive and understand the discharge instructions provided?: Yes Medications obtained,verified, and reconciled?: Yes (Medications Reviewed) Any new allergies since your discharge?: No Dietary orders reviewed?: Yes Do you have support at home?: Yes People in Home: spouse  Medications Reviewed Today: Medications Reviewed Today     Reviewed by Karena Addison, LPN (Licensed Practical Nurse) on 07/19/23 at [phone]  Med List Status: <None>   Medication Order Taking? Sig Documenting Provider Last Dose Status Informant  amLODipine (NORVASC) 5 MG tablet [id number] No Take 1 tablet (5 mg total) by mouth daily. Jomarie Longs, PA-C Taking Active   ammonium lactate (AMLACTIN) 12 % cream [id number] No Apply 1 Application topically as needed for dry skin. Louann Sjogren, DPM Taking Active   ascorbic acid (VITAMIN C) 500 MG tablet [id number] No Take 2 tablets (1,000 mg total) by mouth daily. Montez Morita, PA-C Taking Active   atorvastatin (LIPITOR) 80 MG tablet [id number] No Take 1 tablet (80 mg total) by mouth daily. Jomarie Longs, PA-C Taking Active   Blood Glucose Monitoring Suppl DEVI [id number] No 1 each by Does not apply route daily. May substitute to any manufacturer covered by patient's insurance. Jomarie Longs, PA-C Taking Active   clobetasol cream (TEMOVATE) 0.05 % [id number] No Apply 1 application topically 2 (two)  times daily. Jomarie Longs, PA-C Taking Active Self  Continuous Glucose Sensor (FREESTYLE LIBRE 14 DAY SENSOR) [place] [id number] No 1 Application by Does not apply route every 14 (fourteen) days. Apply upper deltoid every 14 days, use reader to determine blood sugars Breeback, Jade L, PA-C Taking Active   Ertugliflozin-metFORMIN HCl (SEGLUROMET) 2.5-500 MG TABS [id number]  Take 2.5-500 mg by mouth in the morning and at bedtime. [provider]  Active   gabapentin (NEURONTIN) 300 MG capsule [id number] No Take 1 capsule (300 mg total) by mouth 2 (two) times daily. Louann Sjogren, DPM Taking Active   glucose blood (ACCU-CHEK GUIDE) test strip [id number] No 1 EACH BY IN VITRO ROUTE IN THE MORNING, AT NOON, AND AT BEDTIME. MAY SUBSTITUTE TO ANY MANUFACTURER COVERED BY PATIENT'S INSURANCE. Jomarie Longs, PA-C Taking Active   hydrochlorothiazide (HYDRODIURIL) 25 MG tablet [id number] No Take 1 tablet (25 mg total) by mouth daily. Jomarie Longs, PA-C Taking Active   hydrOXYzine (VISTARIL) 25 MG capsule [id number]  TAKE ONE TO TWO TABLETS AS NEEDED FOR ITCHING. Jomarie Longs, PA-C  Active   icosapent Ethyl (VASCEPA) 1 g capsule [id number] No Take 2 capsules (2 g total) by mouth 2 (two) times daily. Jomarie Longs, PA-C Taking Active   imipramine (TOFRANIL) 50 MG tablet [id number] No Take 4 tablets (200 mg total) by mouth at bedtime. Jomarie Longs, PA-C Taking Active   insulin degludec (TRESIBA FLEXTOUCH) 100 UNIT/ML FlexTouch Pen [id number]  Inject 14 Units into the skin 2 (two) times daily. Breeback, Jade L, PA-C  Active   Insulin Pen Needle (PEN NEEDLES) 32G X 4 MM MISC [id number] No Inject 1 Units into the skin daily.  Sunnie Nielsen, DO Taking Active Self  ketoconazole (NIZORAL) 2 % cream [id number] No Apply 1 Application topically 2 (two) times daily. To affected areas on hands and feet. Jomarie Longs, PA-C Taking Active   Multiple Vitamin (MULTIVITAMIN ADULT PO) [id number] No Take 1  tablet by mouth daily. [provider] Taking Active Self  olmesartan (BENICAR) 40 MG tablet [id number] No TAKE 1 TABLET BY MOUTH EVERY DAY Breeback, Jade L, PA-C Taking Active   primidone (MYSOLINE) 50 MG tablet [id number] No Take 1 tablet (50 mg total) by mouth daily. Jomarie Longs, PA-C Taking Active   Secukinumab (COSENTYX [place]) [id number] No Inject into the skin every 30 (thirty) days. [provider] Taking Active   tirzepatide [facility]) 10 MG/0.5ML Pen [id number] No Inject 10 mg into the skin once a week. Jomarie Longs, PA-C Taking Active   traMADol (ULTRAM) 50 MG tablet [id number] No Take one tablet twice a day as needed for moderate pain. Jomarie Longs, PA-C Taking Active   triamcinolone 0.5%-Eucerin equivalent 1:1 cream mixture [id number] No Apply topically 2 (two) times daily. Mix 0.5% triamcinolone cream and Eucerin in a one-to-one mixture. Jomarie Longs, PA-C Taking Active Self  Vitamin D, Ergocalciferol, (DRISDOL) 1.25 MG (50000 UNIT) CAPS capsule [id number] No Take 1 capsule (50,000 Units total) by mouth every 7 (seven) days. Montez Morita, PA-C Taking Active             Home Care and Equipment/Supplies: Were Home Health Services Ordered?: NA Any new equipment or medical supplies ordered?: NA  Functional Questionnaire: Do you need assistance with bathing/showering or dressing?: No Do you need assistance with meal preparation?: No Do you need assistance with eating?: No Do you have difficulty maintaining continence: No Do you need assistance with getting out of bed/getting out of a chair/moving?: No Do you have difficulty managing or taking your medications?: No  Follow up appointments reviewed: PCP Follow-up appointment confirmed?: Yes Date of PCP follow-up appointment?: 07/23/23 Follow-up Provider: [facility] Follow-up appointment confirmed?: NA Do you need transportation to your follow-up appointment?: No Do you understand care  options if your condition(s) worsen?: Yes-patient verbalized understanding    SIGNATURE Karena Addison, LPN  [facility] Nurse Health Advisor Direct Dial [phone]

## 2023-07-20 LAB — CULTURE, BLOOD (ROUTINE X 2)
Culture: NO GROWTH
Culture: NO GROWTH
Special Requests: ADEQUATE
Special Requests: ADEQUATE

## 2023-07-21 DIAGNOSIS — I629 Nontraumatic intracranial hemorrhage, unspecified: Secondary | ICD-10-CM | POA: Insufficient documentation

## 2023-07-23 ENCOUNTER — Inpatient Hospital Stay: Payer: Commercial Managed Care - PPO | Admitting: Physician Assistant

## 2023-07-23 ENCOUNTER — Telehealth: Payer: Self-pay | Admitting: Physician Assistant

## 2023-07-23 NOTE — Telephone Encounter (Signed)
 Copied from CRM [id number]. Topic: General - Other >> Jul 23, 2023 12:59 PM Nila Nephew wrote: Reason for CRM: Patient calling to state patient did not no-show, he was hospitalized and unable to cancel. It looks like the appointment was changed to cancel  Please advise

## 2023-07-24 NOTE — Telephone Encounter (Signed)
 Mr. Robert Frey had an appointment on 07/18/2023 at 8:50 am with Robert Frey and the appointment was cancelled due him being in the hospital. Looks like another appointment was rescheduled to 07/23/23 at 10:10 am for Hospital follow-up. Mr. Robert Frey did not call to reschedule this appointment.  Toniann Fail, do you see something different?

## 2023-08-04 DIAGNOSIS — R9089 Other abnormal findings on diagnostic imaging of central nervous system: Secondary | ICD-10-CM | POA: Insufficient documentation

## 2023-08-06 ENCOUNTER — Ambulatory Visit: Payer: Commercial Managed Care - PPO | Admitting: Behavioral Health

## 2023-08-07 ENCOUNTER — Telehealth: Payer: Self-pay

## 2023-08-07 NOTE — Telephone Encounter (Signed)
 Copied from CRM [id number]. Topic: General - Other >> Aug 06, 2023  1:32 PM Carlatta H wrote: Reason for CRM: Patients wife would like a call back from [facility] assistant please call ph. [phone]

## 2023-08-07 NOTE — Progress Notes (Signed)
 Established Patient Office Visit  Subjective   Patient ID: Robert Frey, male    DOB: 03-22-1961  Age: 63 y.o. MRN: [id number]  CC: hospital follow up   HPI Patient is a 63 yo male who presents for a hospital follow up appointment. His first admission was for fall on 07/21/2023 and discharged on 08/01/2023.  His 2nd admission was on 08/03/23 and discharged 08/04/23 for an intractable acute post-traumatic headache.   He initially reported to the ED after he sustained a fall from standing on his stairs where he landed backwards on his head on 07/21/23. He states he lost his balance and has felt dizzy for the past month-two. He did have two alcoholic  drinks the day he fell. He has not drank anything since the fall. He did start a new medication Cyclosporine 4 days before his fall. He did loose consciousness. Diagnosed with 8mm frontal subdural hematoma, small amounts of SAH, right scapula fx, and a closed fracture of left occipital bone. He was stable upon discharge on 08/01/23 with a GCS of 15. He was taken off Omelsartan 40mg  at this first ED visit.   2nd ED visit for headache showed improvement of midline shift of 3mm. He was given decadron IM and then discharged with percocet, tramadol and fioricet. Pt was hyponateremic at 125. Recommended fluid restriction and BMP recheck. He was restarted on omelsartan at visit.   He states he feels okay today. He is using a cane to ambulate. He does have a walker at home. He continues to have a headache today and is itching due to his psoriasis flare up. He continues to feel dizzy and lightheaded. He is taking Oxycodone every 6 hours. He is not taking Tramadol. Denies any chest pain or trouble breathing today.   Active Ambulatory Problems    Diagnosis Date Noted   HYPERCHOLESTEROLEMIA 03/06/2006   OBESITY, NOS 03/06/2006   OBSESSIVE COMPUL. DISORDER 03/06/2006   Ankylosing spondylitis of multiple sites in spine ([name]) 09/30/2009   Psoriatic arthritis ([name])  10/11/2012   Type 2 diabetes mellitus with microalbuminuria, with long-term current use of insulin ([name]) 10/11/2012   Heel spur 01/20/2014   Osteoarthritis of foot, left 01/20/2014   Lung nodule < 6cm on CT 05/15/2014   Essential hypertension 06/08/2015   Microalbuminuria 06/08/2015   Bruising 06/09/2015   Lumbosacral strain 01/17/2017   Dark urine 01/01/2019   Shakes 04/04/2019   Essential tremor 04/16/2019   Shoulder separation, right, subsequent encounter 06/17/2019   Primary osteoarthritis, right glenohumeral joint 07/02/2019   Current smoker 10/22/2019   Nosebleed 10/22/2019   Diabetic neuropathy ([name]) 01/28/2020   Family history of Alzheimer's disease 11/09/2020   Rib pain on left side 03/07/2021   Fall 03/07/2021   Chronic foot pain, right 05/03/2021   Acute left-sided low back pain without sciatica 06/07/2021   Diabetic foot infection ([name]) 06/28/2021   Psoriasis 06/28/2021   Absent pedal pulses 07/13/2021   Acute right ankle pain 07/13/2021   Family history of blood clots 07/26/2021   Leg edema, right 07/26/2021   Right leg pain 07/26/2021   Syndesmotic disruption of right ankle 08/01/2021   Charcot's joint of right foot 08/03/2021   Dyslipidemia, goal LDL below 70 01/17/2022   Heat exhaustion 12/05/2022   Dehydration 12/05/2022   Hyponatremia 12/05/2022   Tinea pedis of both feet 02/28/2023   Diabetic ulcer of toe of right foot associated with type 2 diabetes mellitus, limited to breakdown of skin ([name]) 03/08/2023  Blister of plantar aspect of right foot 03/08/2023   Drug reaction 04/30/2023   Cellulitis of right foot 05/01/2023   Rash 05/01/2023   Cellulitis of right leg 05/09/2023   SDH (subdural hematoma) ([name]) 08/08/2023   Closed fracture of occipital condyle with routine healing 08/08/2023   Closed fracture of right scapula with routine healing 08/08/2023   Resolved Ambulatory Problems    Diagnosis Date Noted   KNEE PAIN, BILATERAL 12/28/2006    ELEVATED BLOOD PRESSURE WITHOUT DIAGNOSIS OF HYPERTENSION 08/29/2006   Cough 11/25/2010   Past Medical History:  Diagnosis Date   Ankylosing spondylitis ([name])    COPD (chronic obstructive pulmonary disease) ([name])    Diabetes mellitus without complication ([name])    High cholesterol    Hypertension    PTSD (post-traumatic stress disorder)    ROS See HPI   Objective:    BP 98/67 (BP Location: Left Arm, Patient Position: Sitting, Cuff Size: Large)   Pulse 94   Ht 5\' 11"  (1.803 m)   Wt 199 lb 12 oz (90.6 kg)   SpO2 96%   BMI 27.86 kg/m  BP Readings from Last 3 Encounters:  08/08/23 98/67  07/16/23 124/60  05/11/23 124/61   Wt Readings from Last 3 Encounters:  08/08/23 199 lb 12 oz (90.6 kg)  07/15/23 216 lb 14.9 oz (98.4 kg)  05/11/23 217 lb (98.4 kg)   SpO2 Readings from Last 3 Encounters:  08/08/23 96%  07/16/23 100%  05/11/23 98%   Physical Exam Constitutional:      General: He is not in acute distress.    Appearance: Normal appearance.  HENT:     Head: Normocephalic and atraumatic.  Eyes:     Extraocular Movements: Extraocular movements intact.  Neck:     Comments: Cervical neck collar Cardiovascular:     Rate and Rhythm: Normal rate and regular rhythm.     Pulses: Normal pulses.     Heart sounds: Normal heart sounds.  Pulmonary:     Effort: Pulmonary effort is normal.     Breath sounds: Normal breath sounds.  Musculoskeletal:     Comments: Uses cane to ambulate   Skin:    General: Skin is warm.     Capillary Refill: Capillary refill takes less than 2 seconds.  Neurological:     General: No focal deficit present.     Mental Status: He is alert and oriented to person, place, and time.  Psychiatric:     Comments: Depressed mood    Assessment & Plan:  Marland KitchenMarland KitchenDemetrias was seen today for hospitalization follow-up.  Diagnoses and all orders for this visit:  Hospital discharge follow-up  Fall, subsequent encounter -     BMP8+eGFR -     Magnesium -     CBC  w/Diff/Platelet -     B12 and Folate Panel  SDH (subdural hematoma) ([name])  Closed fracture of left occipital condyle with routine healing, subsequent encounter  Closed fracture of right scapula with routine healing, unspecified part of scapula, subsequent encounter  HYPERCHOLESTEROLEMIA -     atorvastatin (LIPITOR) 80 MG tablet; Take 1 tablet (80 mg total) by mouth daily.  Hyperlipidemia LDL goal <70 -     atorvastatin (LIPITOR) 80 MG tablet; Take 1 tablet (80 mg total) by mouth daily.  Hyponatremia -     BMP8+eGFR  Medication management -     BMP8+eGFR -     Magnesium -     CBC w/Diff/Platelet -     B12 and  Folate Panel   BP is low today.  Stop hydrochlorothiazide and continue benicar Keep checking BP at home if below 110/75 can cut benicar in half as well Will check bmp, magnesium, cbc, b12 Concerned about sodium level Ice hematoma to help with headache Recheck BP in office in 1 week  Needed refill for lipitor  Keep all neurology/orthopedic follow ups  Reviewed chart and discussed plan with patient. Spent 40 minutes.     Tandy Gaw, PA-C

## 2023-08-07 NOTE — Telephone Encounter (Signed)
 LM to return call Roselyn Reef, CMA

## 2023-08-08 ENCOUNTER — Encounter: Payer: Self-pay | Admitting: Physician Assistant

## 2023-08-08 ENCOUNTER — Ambulatory Visit: Admitting: Physician Assistant

## 2023-08-08 VITALS — BP 98/67 | HR 94 | Ht 71.0 in | Wt 199.8 lb

## 2023-08-08 DIAGNOSIS — S02113D Unspecified occipital condyle fracture, subsequent encounter for fracture with routine healing: Secondary | ICD-10-CM

## 2023-08-08 DIAGNOSIS — E7800 Pure hypercholesterolemia, unspecified: Secondary | ICD-10-CM

## 2023-08-08 DIAGNOSIS — E785 Hyperlipidemia, unspecified: Secondary | ICD-10-CM | POA: Diagnosis not present

## 2023-08-08 DIAGNOSIS — E871 Hypo-osmolality and hyponatremia: Secondary | ICD-10-CM | POA: Diagnosis not present

## 2023-08-08 DIAGNOSIS — S42101D Fracture of unspecified part of scapula, right shoulder, subsequent encounter for fracture with routine healing: Secondary | ICD-10-CM | POA: Insufficient documentation

## 2023-08-08 DIAGNOSIS — Z79899 Other long term (current) drug therapy: Secondary | ICD-10-CM

## 2023-08-08 DIAGNOSIS — S065XAA Traumatic subdural hemorrhage with loss of consciousness status unknown, initial encounter: Secondary | ICD-10-CM | POA: Insufficient documentation

## 2023-08-08 DIAGNOSIS — W19XXXD Unspecified fall, subsequent encounter: Secondary | ICD-10-CM

## 2023-08-08 DIAGNOSIS — Z09 Encounter for follow-up examination after completed treatment for conditions other than malignant neoplasm: Secondary | ICD-10-CM

## 2023-08-08 MED ORDER — ATORVASTATIN CALCIUM 80 MG PO TABS: 80.0000 mg | ORAL_TABLET | Freq: Every day | ORAL | 3 refills | Status: AC

## 2023-08-08 NOTE — Patient Instructions (Signed)
 Stop hydrochlorothiazide. Continue benicar.  Recheck BP in 1 week.

## 2023-08-09 ENCOUNTER — Encounter: Payer: Self-pay | Admitting: Physician Assistant

## 2023-08-09 ENCOUNTER — Other Ambulatory Visit: Payer: Self-pay | Admitting: Physician Assistant

## 2023-08-09 LAB — BMP8+EGFR
BUN/Creatinine Ratio: 25 — ABNORMAL HIGH (ref 10–24)
BUN: 29 mg/dL — ABNORMAL HIGH (ref 8–27)
CO2: 23 mmol/L (ref 20–29)
Calcium: 10.1 mg/dL (ref 8.6–10.2)
Chloride: 86 mmol/L — ABNORMAL LOW (ref 96–106)
Creatinine, Ser: 1.14 mg/dL (ref 0.76–1.27)
Glucose: 126 mg/dL — ABNORMAL HIGH (ref 70–99)
Potassium: 4.8 mmol/L (ref 3.5–5.2)
Sodium: 127 mmol/L — ABNORMAL LOW (ref 134–144)
eGFR: 74 mL/min/{1.73_m2} (ref >59)

## 2023-08-09 LAB — CBC WITH DIFFERENTIAL/PLATELET
Basophils Absolute: 0.1 10*3/uL (ref 0.0–0.2)
Basos: 1 %
EOS (ABSOLUTE): 0.3 10*3/uL (ref 0.0–0.4)
Eos: 3 %
Hematocrit: 36.6 % — ABNORMAL LOW (ref 37.5–51.0)
Hemoglobin: 12.5 g/dL — ABNORMAL LOW (ref 13.0–17.7)
Immature Grans (Abs): 0 10*3/uL (ref 0.0–0.1)
Immature Granulocytes: 0 %
Lymphocytes Absolute: 1.4 10*3/uL (ref 0.7–3.1)
Lymphs: 14 %
MCH: 31.4 pg (ref 26.6–33.0)
MCHC: 34.2 g/dL (ref 31.5–35.7)
MCV: 92 fL (ref 79–97)
Monocytes Absolute: 1.4 10*3/uL — ABNORMAL HIGH (ref 0.1–0.9)
Monocytes: 13 %
Neutrophils Absolute: 6.9 10*3/uL (ref 1.4–7.0)
Neutrophils: 69 %
Platelets: 485 10*3/uL — ABNORMAL HIGH (ref 150–450)
RBC: 3.98 x10E6/uL — ABNORMAL LOW (ref 4.14–5.80)
RDW: 14.5 % (ref 11.6–15.4)
WBC: 10.2 10*3/uL (ref 3.4–10.8)

## 2023-08-09 LAB — B12 AND FOLATE PANEL
Folate: >20 ng/mL (ref >3.0)
Vitamin B-12: 1152 pg/mL (ref 232–1245)

## 2023-08-09 LAB — MAGNESIUM: Magnesium: 2.4 mg/dL — ABNORMAL HIGH (ref 1.6–2.3)

## 2023-08-09 NOTE — Progress Notes (Signed)
 Quinntin,   B12 and folate look great.  Kidney are too dry. Make sure getting around 40oz of water a day.  Sodium is too low. I am hoping stopping the diuretic is going to help. Lets recheck sodium on Friday lab only. (BMP)

## 2023-08-10 ENCOUNTER — Other Ambulatory Visit: Payer: Self-pay | Admitting: Physician Assistant

## 2023-08-10 ENCOUNTER — Encounter: Payer: Self-pay | Admitting: Physician Assistant

## 2023-08-10 DIAGNOSIS — E785 Hyperlipidemia, unspecified: Secondary | ICD-10-CM | POA: Insufficient documentation

## 2023-08-10 NOTE — Telephone Encounter (Signed)
 Copied from CRM [id number]. Topic: Clinical - Medication Refill >> Aug 10, 2023  1:02 PM Albin Felling L wrote: Most Recent Primary Care Visit:  Provider: Jomarie Longs  Department: [facility]  Visit Type: HOSPITAL FOLLOW UP  Date: 08/08/2023  Medication: oxyCODONE-acetaminophen (PERCOCET/ROXICET) 5-325 MG tablet Rx filled by hospital, requesting Jade fill if possible.  Patient will be out by Sunday, patient requesting 3 days worth to get him until appointment next week.   Has the patient contacted their pharmacy? Yes Patient unable to get someone on the phone to request refill via pharmacy.   Is this the correct pharmacy for this prescription? Yes This is the patient's preferred pharmacy:  [phone] [place], [place] - [address] [place] [address] Phone: [phone] Fax: [phone]   Has the prescription been filled recently? Yes  Is the patient out of the medication? No  Has the patient been seen for an appointment in the last year OR does the patient have an upcoming appointment? Yes  Can we respond through MyChart? Yes  Agent: Please be advised that Rx refills may take up to 3 business days. We ask that you follow-up with your pharmacy.

## 2023-08-10 NOTE — Telephone Encounter (Signed)
 Last Fill: 08/04/23   Last OV: 08/08/23 Next OV: 08/15/23  Routing to provider for review/authorization.

## 2023-08-14 ENCOUNTER — Encounter: Payer: Self-pay | Admitting: Physician Assistant

## 2023-08-15 ENCOUNTER — Ambulatory Visit: Admitting: Physician Assistant

## 2023-08-15 NOTE — Telephone Encounter (Signed)
Not on current medication list.   

## 2023-08-17 ENCOUNTER — Telehealth: Payer: Self-pay

## 2023-08-17 MED ORDER — OXYCODONE-ACETAMINOPHEN 5-325 MG PO TABS: 1.0000 | ORAL_TABLET | Freq: Four times a day (QID) | ORAL | 0 refills | Status: AC | PRN

## 2023-08-17 NOTE — Transitions of Care (Post Inpatient/ED Visit) (Signed)
 08/17/2023  Name: Robert Frey MRN: [id number] DOB: 04/25/1961  Today's TOC FU Call Status: Today's TOC FU Call Status:: Successful TOC FU Call Completed TOC FU Call Complete Date: 08/17/23 Patient's Name and Date of Birth confirmed.  Transition Care Management Follow-up Telephone Call Date of Discharge: 08/16/23 Discharge Facility: Other ([facility]) Name of Other ([facility]) Discharge Facility: [facility] Type of Discharge: Inpatient Admission Primary Inpatient Discharge Diagnosis:: intracranial hemorrhage How have you been since you were released from the hospital?: Better Any questions or concerns?: No  Items Reviewed: Did you receive and understand the discharge instructions provided?: Yes Medications obtained,verified, and reconciled?: Yes (Medications Reviewed) Any new allergies since your discharge?: No Dietary orders reviewed?: Yes Do you have support at home?: Yes People in Home: spouse  Medications Reviewed Today: Medications Reviewed Today     Reviewed by Karena Addison, LPN (Licensed Practical Nurse) on 08/17/23 at 0945  Med List Status: <None>   Medication Order Taking? Sig Documenting Provider Last Dose Status Informant  amLODipine (NORVASC) 5 MG tablet [id number] No Take 1 tablet (5 mg total) by mouth daily. Jomarie Longs, PA-C Taking Active   ammonium lactate (AMLACTIN) 12 % cream [id number] No Apply 1 Application topically as needed for dry skin. Louann Sjogren, DPM Taking Active   ascorbic acid (VITAMIN C) 500 MG tablet [id number] No Take 2 tablets (1,000 mg total) by mouth daily. Montez Morita, PA-C Taking Active   atorvastatin (LIPITOR) 80 MG tablet [id number]  Take 1 tablet (80 mg total) by mouth daily. Jomarie Longs, PA-C  Active   Blood Glucose Monitoring Suppl DEVI [id number] No 1 each by Does not apply route daily. May substitute to any manufacturer covered by patient's insurance. Jomarie Longs, PA-C Taking Active   calcipotriene (DOVONOX) 0.005  % ointment [id number]  Apply topically. [provider]  Active   cetirizine (ZYRTEC) 10 MG tablet [id number]  Take by mouth. [provider]  Active   clobetasol cream (TEMOVATE) 0.05 % [id number] No Apply 1 application topically 2 (two) times daily. Jomarie Longs, PA-C Taking Active Self  Continuous Glucose Sensor (FREESTYLE LIBRE 14 DAY SENSOR) [place] [id number] No 1 Application by Does not apply route every 14 (fourteen) days. Apply upper deltoid every 14 days, use reader to determine blood sugars Breeback, Jade L, PA-C Taking Active   cycloSPORINE modified (NEORAL) 100 MG capsule [id number]  Take by mouth. [provider]  Active   Dapagliflozin Pro-metFORMIN ER (XIGDUO XR) 02-999 MG TB24 [id number]  Take 1 tablet by mouth daily. [provider]  Active   doxepin (SINEQUAN) 10 MG capsule [id number]  Take 10 mg by mouth at bedtime. [provider]  Active   Ertugliflozin-metFORMIN HCl (SEGLUROMET) 2.5-500 MG TABS [id number]  Take 2.5-500 mg by mouth in the morning and at bedtime. [provider]  Active   gabapentin (NEURONTIN) 300 MG capsule [id number] No Take 1 capsule (300 mg total) by mouth 2 (two) times daily. Louann Sjogren, DPM Taking Active   glucose blood (ACCU-CHEK GUIDE) test strip [id number] No 1 EACH BY IN VITRO ROUTE IN THE MORNING, AT NOON, AND AT BEDTIME. MAY SUBSTITUTE TO ANY MANUFACTURER COVERED BY PATIENT'S INSURANCE. Jomarie Longs, PA-C Taking Active   hydrOXYzine (VISTARIL) 25 MG capsule [id number]  TAKE ONE TO TWO TABLETS AS NEEDED FOR ITCHING. Jomarie Longs, PA-C  Active   icosapent Ethyl (VASCEPA) 1 g capsule [id number] No Take 2 capsules (2 g total) by mouth 2 (two) times daily. Caleen Essex,  Jade L, PA-C Taking Active   imipramine (TOFRANIL) 50 MG tablet [id number] No Take 4 tablets (200 mg total) by mouth at bedtime. Jomarie Longs, PA-C Taking Active   insulin degludec (TRESIBA FLEXTOUCH) 100 UNIT/ML FlexTouch Pen [id number]   Inject 14 Units into the skin 2 (two) times daily. Breeback, Jade L, PA-C  Active   Insulin Pen Needle (PEN NEEDLES) 32G X 4 MM MISC [id number] No Inject 1 Units into the skin daily. Sunnie Nielsen, DO Taking Active Self  ketoconazole (NIZORAL) 2 % cream [id number] No Apply 1 Application topically 2 (two) times daily. To affected areas on hands and feet. Jomarie Longs, PA-C Taking Active   magnesium oxide (MAG-OX) 400 MG tablet [id number]  Take by mouth. [provider]  Active   Multiple Vitamin (MULTIVITAMIN ADULT PO) [id number] No Take 1 tablet by mouth daily. [provider] Taking Active Self  olmesartan (BENICAR) 40 MG tablet [id number] No TAKE 1 TABLET BY MOUTH EVERY DAY Breeback, Jade L, PA-C Taking Active   primidone (MYSOLINE) 50 MG tablet [id number] No Take 1 tablet (50 mg total) by mouth daily. Jomarie Longs, PA-C Taking Active   tirzepatide [facility]) 10 MG/0.5ML Pen [id number] No Inject 10 mg into the skin once a week. Jomarie Longs, PA-C Taking Active   traMADol (ULTRAM) 50 MG tablet [id number] No Take one tablet twice a day as needed for moderate pain. Jomarie Longs, PA-C Taking Active   triamcinolone 0.5%-Eucerin equivalent 1:1 cream mixture [id number] No Apply topically 2 (two) times daily. Mix 0.5% triamcinolone cream and Eucerin in a one-to-one mixture. Jomarie Longs, PA-C Taking Active Self  Vitamin D, Ergocalciferol, (DRISDOL) 1.25 MG (50000 UNIT) CAPS capsule [id number] No Take 1 capsule (50,000 Units total) by mouth every 7 (seven) days. Montez Morita, PA-C Taking Active             Home Care and Equipment/Supplies: Were Home Health Services Ordered?: NA Any new equipment or medical supplies ordered?: NA  Functional Questionnaire: Do you need assistance with bathing/showering or dressing?: No Do you need assistance with meal preparation?: No Do you need assistance with eating?: No Do you have difficulty maintaining continence: No Do you need  assistance with getting out of bed/getting out of a chair/moving?: No Do you have difficulty managing or taking your medications?: No  Follow up appointments reviewed: PCP Follow-up appointment confirmed?: Yes Date of PCP follow-up appointment?: 08/22/23 Follow-up Provider: [facility] Follow-up appointment confirmed?: Yes Date of Specialist follow-up appointment?: 08/21/23 Follow-Up Specialty Provider:: neuro Do you need transportation to your follow-up appointment?: No Do you understand care options if your condition(s) worsen?: Yes-patient verbalized understanding    SIGNATURE Karena Addison, LPN [facility] Nurse Health Advisor Direct Dial [phone]

## 2023-08-20 ENCOUNTER — Telehealth: Payer: Self-pay | Admitting: Physician Assistant

## 2023-08-20 NOTE — Telephone Encounter (Signed)
 Copied from CRM [id number]. Topic: General - Other >> Aug 20, 2023 11:59 AM Nila Nephew wrote: Reason for CRM: Aletta Edouard Roddie Mc) can be contacted at [phone] ext [phone], she is the new nurse care manager overseeing this patient from an insurance perspective. She wishes to leave her contact information on file for any questions.  Patient is requesting most recent treatment plan and medication list to her fax at [phone]. Provided our fax number.

## 2023-08-22 ENCOUNTER — Encounter: Payer: Self-pay | Admitting: Physician Assistant

## 2023-08-22 ENCOUNTER — Ambulatory Visit (INDEPENDENT_AMBULATORY_CARE_PROVIDER_SITE_OTHER): Admitting: Physician Assistant

## 2023-08-22 VITALS — BP 117/57 | HR 112 | Ht 71.0 in | Wt 203.0 lb

## 2023-08-22 DIAGNOSIS — W19XXXD Unspecified fall, subsequent encounter: Secondary | ICD-10-CM

## 2023-08-22 DIAGNOSIS — E1129 Type 2 diabetes mellitus with other diabetic kidney complication: Secondary | ICD-10-CM | POA: Diagnosis not present

## 2023-08-22 DIAGNOSIS — E1142 Type 2 diabetes mellitus with diabetic polyneuropathy: Secondary | ICD-10-CM

## 2023-08-22 DIAGNOSIS — S42101D Fracture of unspecified part of scapula, right shoulder, subsequent encounter for fracture with routine healing: Secondary | ICD-10-CM

## 2023-08-22 DIAGNOSIS — I444 Left anterior fascicular block: Secondary | ICD-10-CM

## 2023-08-22 DIAGNOSIS — I4510 Unspecified right bundle-branch block: Secondary | ICD-10-CM

## 2023-08-22 DIAGNOSIS — E871 Hypo-osmolality and hyponatremia: Secondary | ICD-10-CM | POA: Diagnosis not present

## 2023-08-22 DIAGNOSIS — Z09 Encounter for follow-up examination after completed treatment for conditions other than malignant neoplasm: Secondary | ICD-10-CM | POA: Diagnosis not present

## 2023-08-22 DIAGNOSIS — Z794 Long term (current) use of insulin: Secondary | ICD-10-CM

## 2023-08-22 DIAGNOSIS — S02113D Unspecified occipital condyle fracture, subsequent encounter for fracture with routine healing: Secondary | ICD-10-CM

## 2023-08-22 DIAGNOSIS — S065XAA Traumatic subdural hemorrhage with loss of consciousness status unknown, initial encounter: Secondary | ICD-10-CM

## 2023-08-22 DIAGNOSIS — R809 Proteinuria, unspecified: Secondary | ICD-10-CM

## 2023-08-22 MED ORDER — PREGABALIN 75 MG PO CAPS: 75.0000 mg | ORAL_CAPSULE | Freq: Two times a day (BID) | ORAL | 1 refills | Status: DC

## 2023-08-22 NOTE — Progress Notes (Unsigned)
 Established Patient Office Visit  Subjective   Patient ID: Robert Frey, male    DOB: [birth year]  Age: 63 y.o. MRN: [id number]  Chief Complaint  Patient presents with   Hospitalization Follow-up    Fall,presented after sustaining injuries in a GLF. History was obtained from patient, EMR. Briefly, the patient was pulling up on a drawer in the fridge when the handle came off and he fell backwards. Patient struck the back of his head, but did not lose consciousness. Of note, patient with previous calvarial fracture and SDH from recent previous fall and is in a C collar. He complained of R knee pain, headache    HPI Pt is a 63 yo male who presents to the clinic to follow up after another fall that sent him to ED on 08/14/2023. He was pulling on his refrigerator drawer when the handle broke loose and he fell backwards hitting his head again.  He is accompanied by his wife.   No LOC. Pt remains in C collar. CT no change in SDH 7mm. Pt is on keppra for seizure prevention. Speech, OT, PT referrals were placed in ED. Magnesium was 133 in hospital and improving.   During a hospitalization pt's gabapentin was stopped. Pt is having a lot more "shooting pain" in legs and arms.   Pt is on [place] but not able to wear CGM. He is checking his sugars in the morning and running 90s-120s. He has occasional low sugars in the 60s but not lower than that. He will eat or drink and comes right back up.   .. Active Ambulatory Problems    Diagnosis Date Noted   HYPERCHOLESTEROLEMIA 03/06/2006   OBESITY, NOS 03/06/2006   OBSESSIVE COMPUL. DISORDER 03/06/2006   Ankylosing spondylitis of multiple sites in spine ([name]) 09/30/2009   Psoriatic arthritis ([name]) 10/11/2012   Type 2 diabetes mellitus with microalbuminuria, with long-term current use of insulin ([name]) 10/11/2012   Heel spur 01/20/2014   Osteoarthritis of foot, left 01/20/2014   Lung nodule < 6cm on CT 05/15/2014   Essential hypertension  06/08/2015   Microalbuminuria 06/08/2015   Bruising 06/09/2015   Lumbosacral strain 01/17/2017   Dark urine 01/01/2019   Shakes 04/04/2019   Essential tremor 04/16/2019   Shoulder separation, right, subsequent encounter 06/17/2019   Primary osteoarthritis, right glenohumeral joint 07/02/2019   Current smoker 10/22/2019   Nosebleed 10/22/2019   Diabetic neuropathy ([name]) 01/28/2020   Family history of Alzheimer's disease 11/09/2020   Rib pain on left side 03/07/2021   Fall 03/07/2021   Chronic foot pain, right 05/03/2021   Acute left-sided low back pain without sciatica 06/07/2021   Diabetic foot infection ([name]) 06/28/2021   Psoriasis 06/28/2021   Absent pedal pulses 07/13/2021   Acute right ankle pain 07/13/2021   Family history of blood clots 07/26/2021   Leg edema, right 07/26/2021   Right leg pain 07/26/2021   Syndesmotic disruption of right ankle 08/01/2021   Charcot's joint of right foot 08/03/2021   Dyslipidemia, goal LDL below 70 01/17/2022   Heat exhaustion 12/05/2022   Dehydration 12/05/2022   Hyponatremia 12/05/2022   Tinea pedis of both feet 02/28/2023   Diabetic ulcer of toe of right foot associated with type 2 diabetes mellitus, limited to breakdown of skin ([name]) 03/08/2023   Blister of plantar aspect of right foot 03/08/2023   Drug reaction 04/30/2023   Cellulitis of right foot 05/01/2023   Rash 05/01/2023   Cellulitis of right leg 05/09/2023  SDH (subdural hematoma) ([name]) 08/08/2023   Closed fracture of occipital condyle with routine healing 08/08/2023   Closed fracture of right scapula with routine healing 08/08/2023   Hyperlipidemia LDL goal <70 08/10/2023   Resolved Ambulatory Problems    Diagnosis Date Noted   KNEE PAIN, BILATERAL 12/28/2006   ELEVATED BLOOD PRESSURE WITHOUT DIAGNOSIS OF HYPERTENSION 08/29/2006   Cough 11/25/2010   Past Medical History:  Diagnosis Date   Ankylosing spondylitis ([name])    COPD (chronic obstructive pulmonary disease)  ([name])    Diabetes mellitus without complication ([name])    High cholesterol    Hypertension    PTSD (post-traumatic stress disorder)      ROS See HPI.    Objective:     BP (!) 117/57   Pulse (!) 112   Ht 5\' 11"  (1.803 m)   Wt 203 lb (92.1 kg)   SpO2 99%   BMI 28.31 kg/m  BP Readings from Last 3 Encounters:  08/22/23 (!) 117/57  08/08/23 98/67  07/16/23 124/60   Wt Readings from Last 3 Encounters:  08/22/23 203 lb (92.1 kg)  08/08/23 199 lb 12 oz (90.6 kg)  07/15/23 216 lb 14.9 oz (98.4 kg)      Physical Exam Constitutional:      Appearance: He is ill-appearing.  HENT:     Head: Normocephalic.  Neck:     Comments: In a C collar Cardiovascular:     Rate and Rhythm: Normal rate.  Pulmonary:     Effort: Pulmonary effort is normal.     Breath sounds: Normal breath sounds.  Musculoskeletal:     Right lower leg: No edema.     Left lower leg: No edema.  Neurological:     General: No focal deficit present.     Mental Status: He is alert and oriented to person, place, and time.  Psychiatric:        Mood and Affect: Mood normal.         The 10-year ASCVD risk score (Arnett DK, et al., 2019) is: 22%    Assessment & Plan:  Marland KitchenMarland KitchenNathaneal was seen today for hospitalization follow-up.  Diagnoses and all orders for this visit:  Hospital discharge follow-up  SDH (subdural hematoma) ([name])  Hyponatremia -     BMP8+eGFR  Fall, subsequent encounter  Type 2 diabetes mellitus with microalbuminuria, with long-term current use of insulin ([name]) -     tirzepatide (MOUNJARO) 10 MG/0.5ML Pen; Inject 10 mg into the skin once a week.  RBBB -     Ambulatory referral to Cardiology  LAFB (left anterior fascicular block) -     Ambulatory referral to Cardiology  Closed fracture of left occipital condyle with routine healing, subsequent encounter  Closed fracture of right scapula with routine healing, unspecified part of scapula, subsequent encounter  Diabetic  polyneuropathy associated with type 2 diabetes mellitus ([name]) -     pregabalin (LYRICA) 75 MG capsule; Take 1 capsule (75 mg total) by mouth 2 (two) times daily.   Bmp to follow up on sodium and kidney function Continue off hydrochlorothiazide if having a day of swelling could take 1/2 tablet Continue to keep trauma/neuro/orthopedic follow ups Referral made to cardiology due to patients high risk cardiovascular status and blocks seen on EKG Discussed A1C management with tresbia and mounjaro Goal glucose 90-120 in am Suspect pain is increasing due to being off gabapentin Start lyrica to help with neuropathy and other chronic pain Follow up in 6 weeks  Needs PT/OT/Speech  after fall and brain injury Sent note to find a place to do all together and can place referral  Pt is not able to work Work note extended past his next trauma surgeon appt   Return in about 6 weeks (around 10/03/2023), or if symptoms worsen or fail to improve.    Tandy Gaw, PA-C

## 2023-08-22 NOTE — Patient Instructions (Addendum)
 90-120 sugar goals in the morning.  Stay on hydrochlorothiazide if swelling increases to 1/2 tablet as needed.  Trial of lyrica twice a day Extended work note Will find place of speech/OT/PT

## 2023-08-23 ENCOUNTER — Encounter: Payer: Self-pay | Admitting: Physician Assistant

## 2023-08-23 LAB — BMP8+EGFR
BUN/Creatinine Ratio: 16 (ref 10–24)
BUN: 19 mg/dL (ref 8–27)
CO2: 23 mmol/L (ref 20–29)
Calcium: 9.7 mg/dL (ref 8.6–10.2)
Chloride: 98 mmol/L (ref 96–106)
Creatinine, Ser: 1.16 mg/dL (ref 0.76–1.27)
Glucose: 150 mg/dL — ABNORMAL HIGH (ref 70–99)
Potassium: 4.8 mmol/L (ref 3.5–5.2)
Sodium: 137 mmol/L (ref 134–144)
eGFR: 72 mL/min/{1.73_m2} (ref >59)

## 2023-08-23 NOTE — Progress Notes (Signed)
 GREAT news. Sodium and potassium look GREAT.

## 2023-08-24 ENCOUNTER — Encounter: Payer: Self-pay | Admitting: Physician Assistant

## 2023-08-24 MED ORDER — TIRZEPATIDE 10 MG/0.5ML ~~LOC~~ SOAJ
10.0000 mg | SUBCUTANEOUS | 0 refills | Status: DC
Start: 2023-08-24 — End: 2024-01-07

## 2023-08-24 NOTE — Addendum Note (Signed)
 Addended by: Jomarie Longs on: 08/24/2023 04:20 PM   Modules accepted: Orders

## 2023-08-30 ENCOUNTER — Encounter: Payer: Self-pay | Admitting: Physician Assistant

## 2023-08-31 NOTE — Telephone Encounter (Signed)
 Robert Frey,  Please see mychart sent by pt and advise what you recommend.

## 2023-09-10 ENCOUNTER — Encounter: Payer: Self-pay | Admitting: Physician Assistant

## 2023-09-10 ENCOUNTER — Telehealth (INDEPENDENT_AMBULATORY_CARE_PROVIDER_SITE_OTHER): Admitting: Physician Assistant

## 2023-09-10 VITALS — BP 140/78

## 2023-09-10 DIAGNOSIS — I951 Orthostatic hypotension: Secondary | ICD-10-CM | POA: Diagnosis not present

## 2023-09-10 DIAGNOSIS — S02113D Unspecified occipital condyle fracture, subsequent encounter for fracture with routine healing: Secondary | ICD-10-CM

## 2023-09-10 DIAGNOSIS — S065XAA Traumatic subdural hemorrhage with loss of consciousness status unknown, initial encounter: Secondary | ICD-10-CM | POA: Diagnosis not present

## 2023-09-10 DIAGNOSIS — E1129 Type 2 diabetes mellitus with other diabetic kidney complication: Secondary | ICD-10-CM

## 2023-09-10 DIAGNOSIS — E871 Hypo-osmolality and hyponatremia: Secondary | ICD-10-CM

## 2023-09-10 DIAGNOSIS — R809 Proteinuria, unspecified: Secondary | ICD-10-CM

## 2023-09-10 DIAGNOSIS — S42101D Fracture of unspecified part of scapula, right shoulder, subsequent encounter for fracture with routine healing: Secondary | ICD-10-CM

## 2023-09-10 DIAGNOSIS — Z794 Long term (current) use of insulin: Secondary | ICD-10-CM

## 2023-09-10 MED ORDER — FREESTYLE LIBRE 3 SENSOR MISC: 3 refills | Status: DC

## 2023-09-10 MED ORDER — DAPAGLIFLOZIN PRO-METFORMIN ER 10-1000 MG PO TB24: 1.0000 | ORAL_TABLET | Freq: Every day | ORAL | 1 refills | Status: DC

## 2023-09-10 NOTE — Progress Notes (Unsigned)
..  Virtual Visit via Video Note  I connected with KYAL ARTS on 09/11/23 at  2:40 PM EDT by a video enabled telemedicine application and verified that I am speaking with the correct person using two identifiers.  Location: Patient: home Provider: clinic  .Aaron AasParticipating in visit:  Patient: Fidel Provider: Sandy Crumb PA-C   I discussed the limitations of evaluation and management by telemedicine and the availability of in person appointments. The patient expressed understanding and agreed to proceed.  History of Present Illness: Pt is a 63 yo male with recent history of a fall that led to subdural hematoma, closed occipital fracture, scapula fracture, hyponatremia and orthostatic hypotension. He has had 5 ED visits for this. He is doing better today. He is off all BP medications. He was released from collar today and allowed to go back to work tomorrow. He is checking his BP regularly. He is trying to stay hydrated. He continues to speech for help with swallowing.   He does need libre 3 and refills on xigduo for DM.   Observations/Objective: No acute distress Normal breathing  .Aaron Aas Today's Vitals   09/10/23 1424  BP: (!) 140/78      Assessment and Plan: Aaron AasAaron AasDelmer was seen today for hospitalization follow-up.  Diagnoses and all orders for this visit:  Orthostatic hypotension  Type 2 diabetes mellitus with microalbuminuria, with long-term current use of insulin ([name]) -     Dapagliflozin Pro-metFORMIN ER (XIGDUO XR) 02-999 MG TB24; Take 1 tablet by mouth daily. -     Continuous Glucose Sensor (FREESTYLE LIBRE 3 SENSOR) MISC; Place 1 sensor on the skin every 14 days. Use to check glucose continuously  SDH (subdural hematoma) ([name])  Closed fracture of left occipital condyle with routine healing, subsequent encounter  Closed fracture of right scapula with routine healing, unspecified part of scapula, subsequent encounter  Hyponatremia   6 days ago sodium great and in  normal range BP looks good today Continue off medications for BP and will watch closely Jerrilyn Moras 3 sent to pharmacy with xigduo refilled Pt to go back to work tomorrow Continue in PT and OT and speech until released and met goals Follow up as needed   Follow Up Instructions:    I discussed the assessment and treatment plan with the patient. The patient was provided an opportunity to ask questions and all were answered. The patient agreed with the plan and demonstrated an understanding of the instructions.   The patient was advised to call back or seek an in-person evaluation if the symptoms worsen or if the condition fails to improve as anticipated.   Ayush Boulet, PA-C

## 2023-09-11 ENCOUNTER — Telehealth: Payer: Self-pay | Admitting: Behavioral Health

## 2023-09-11 DIAGNOSIS — I951 Orthostatic hypotension: Secondary | ICD-10-CM | POA: Insufficient documentation

## 2023-09-11 NOTE — Telephone Encounter (Signed)
 I spoke with the patient. He is recovering from a bad accient which they think may have been caused by a stroke. He sustained multiple injuries and was lucky to live having to be airlifted to the hospital. He ws back in the hospital last week for complications. He did drive to work for the first time today but still has compliactions and multiple M.D. visits. He is aware that he can return to therapy but says that he has so many visits now he will have to wait to get back in therapy.

## 2023-09-13 ENCOUNTER — Ambulatory Visit (INDEPENDENT_AMBULATORY_CARE_PROVIDER_SITE_OTHER): Payer: Commercial Managed Care - PPO | Admitting: Podiatry

## 2023-09-13 DIAGNOSIS — M79675 Pain in left toe(s): Secondary | ICD-10-CM

## 2023-09-13 DIAGNOSIS — E1142 Type 2 diabetes mellitus with diabetic polyneuropathy: Secondary | ICD-10-CM

## 2023-09-13 DIAGNOSIS — B351 Tinea unguium: Secondary | ICD-10-CM

## 2023-09-13 DIAGNOSIS — M79674 Pain in right toe(s): Secondary | ICD-10-CM

## 2023-09-13 NOTE — Progress Notes (Signed)
  Subjective:  Patient ID: Robert Frey, male    DOB: [birth year],   MRN: [id number]  No chief complaint on file.   63 y.o. male presents for  concern of thickened elongated and painful nails that are difficult to trim. Requesting to have them trimmed today.  Patient has a history of psoriasis and had been treating this.  Now on dupixent and doing much better. Relates burning and tingling in their feet. Patient is diabetic and last A1c was  Lab Results  Component Value Date   HGBA1C 6.6 (A) 04/17/2023   .  Was in the hospital for stroke   PCP:  Araceli Knight, PA-C    . Denies any other pedal complaints. Denies n/v/f/c.   Past Medical History:  Diagnosis Date   Ankylosing spondylitis ([name])    COPD (chronic obstructive pulmonary disease) ([name])    Diabetes mellitus without complication ([name])    High cholesterol    Hypertension    PTSD (post-traumatic stress disorder)     Objective:  Physical Exam: Vascular: DP/PT pulses 2/4 bilateral. CFT <3 seconds. Absent hair growth on digits. Edema noted to bilateral lower extremities. Xerosis noted bilaterally.  Skin. No lacerations or abrasions bilateral feet. Nails 1-5 bilateral  are thickened discolored and elongated with subungual debris. Feet appear much improved no fissues or hyperkeratosis noted today. Small abrasion to dorsum of right foot.  Musculoskeletal: MMT 5/5 bilateral lower extremities in DF, PF, Inversion and Eversion. Deceased ROM in DF of ankle joint. Hallux malleolus noted bilateral Neurological: Sensation intact to light touch. Protective sensation diminished bilateral.    Assessment:   1. Pre-ulcerative calluses   2. Diabetic polyneuropathy associated with type 2 diabetes mellitus ([name])          Plan:  Patient was evaluated and treated and all questions answered. -Discussed and educated patient on diabetic foot care, especially with  regards to the vascular, neurological and musculoskeletal systems.   -Stressed the importance of good glycemic control and the detriment of not  controlling glucose levels in relation to the foot. -Discussed supportive shoes at all times and checking feet regularly.  -Mechanically debrided all nails 1-5 bilateral using sterile nail nipper and filed with dremel without incident  -Answered all patient questions -Patient to return  in 3 months for at risk foot care -Patient advised to call the office if any problems or questions arise in the meantime. .   Returnin 3 months for rfc. .   No follow-ups on file.    Jennefer Moats, DPM

## 2023-10-05 ENCOUNTER — Ambulatory Visit: Admitting: Physician Assistant

## 2023-10-09 ENCOUNTER — Other Ambulatory Visit: Payer: Self-pay | Admitting: Physician Assistant

## 2023-10-09 DIAGNOSIS — G250 Essential tremor: Secondary | ICD-10-CM

## 2023-10-09 DIAGNOSIS — I10 Essential (primary) hypertension: Secondary | ICD-10-CM

## 2023-10-09 DIAGNOSIS — Z789 Other specified health status: Secondary | ICD-10-CM | POA: Insufficient documentation

## 2023-10-16 ENCOUNTER — Ambulatory Visit: Admitting: Behavioral Health

## 2023-10-26 NOTE — Telephone Encounter (Unsigned)
 Copied from CRM [id number]. Topic: Clinical - Medication Refill >> Oct 26, 2023  2:07 PM Retta Caster wrote: Medication: pregabalin  (LYRICA ) 75 MG capsule   Has the patient contacted their pharmacy? Yes (Agent: If no, request that the patient contact the pharmacy for the refill. If patient does not wish to contact the pharmacy document the reason why and proceed with request.) (Agent: If yes, when and what did the pharmacy advise?)  This is the patient's preferred pharmacy:  [phone] [place] [place] [facility] [address] [place] [address] Phone: [phone] Fax: [phone]     Is this the correct pharmacy for this prescription? Yes If no, delete pharmacy and type the correct one.   Has the prescription been filled recently? Yes  Is the patient out of the medication? Yes  Has the patient been seen for an appointment in the last year OR does the patient have an upcoming appointment? Yes  Can we respond through MyChart? Yes  Agent: Please be advised that Rx refills may take up to 3 business days. We ask that you follow-up with your pharmacy.

## 2023-11-04 ENCOUNTER — Other Ambulatory Visit: Payer: Self-pay | Admitting: Physician Assistant

## 2023-11-04 DIAGNOSIS — E1142 Type 2 diabetes mellitus with diabetic polyneuropathy: Secondary | ICD-10-CM

## 2023-12-04 ENCOUNTER — Ambulatory Visit: Payer: Commercial Managed Care - PPO | Admitting: Dermatology

## 2023-12-04 ENCOUNTER — Other Ambulatory Visit: Payer: Self-pay | Admitting: Physician Assistant

## 2023-12-04 DIAGNOSIS — M450 Ankylosing spondylitis of multiple sites in spine: Secondary | ICD-10-CM

## 2023-12-13 ENCOUNTER — Encounter: Payer: Self-pay | Admitting: Podiatry

## 2023-12-13 ENCOUNTER — Ambulatory Visit (INDEPENDENT_AMBULATORY_CARE_PROVIDER_SITE_OTHER): Admitting: Podiatry

## 2023-12-13 DIAGNOSIS — E1142 Type 2 diabetes mellitus with diabetic polyneuropathy: Secondary | ICD-10-CM

## 2023-12-13 DIAGNOSIS — M79675 Pain in left toe(s): Secondary | ICD-10-CM | POA: Diagnosis not present

## 2023-12-13 DIAGNOSIS — M79674 Pain in right toe(s): Secondary | ICD-10-CM

## 2023-12-13 DIAGNOSIS — B351 Tinea unguium: Secondary | ICD-10-CM | POA: Diagnosis not present

## 2023-12-13 NOTE — Progress Notes (Signed)
  Subjective:  Patient ID: Robert Frey, male    DOB: [birth year],   MRN: [id number]  Chief Complaint  Patient presents with   Diabetes    [name] going to look at my feet.  I have several spots that are making it hard for me to walk.    63 y.o. male presents for  concern of thickened elongated and painful nails that are difficult to trim. Requesting to have them trimmed today.  Patient has a history of psoriasis and had been treating this.  Now on dupixent and doing much better. Relates burning and tingling in their feet. Patient is diabetic and last A1c was  Lab Results  Component Value Date   HGBA1C 6.6 (A) 04/17/2023   .  Was in the hospital for stroke   PCP:  Robert Vermell CROME, PA-C    . Denies any other pedal complaints. Denies n/v/f/c.   Past Medical History:  Diagnosis Date   Ankylosing spondylitis ([name])    COPD (chronic obstructive pulmonary disease) ([name])    Diabetes mellitus without complication ([name])    High cholesterol    Hypertension    PTSD (post-traumatic stress disorder)     Objective:  Physical Exam: Vascular: DP/PT pulses 2/4 bilateral. CFT <3 seconds. Absent hair growth on digits. Edema noted to bilateral lower extremities. Xerosis noted bilaterally.  Skin. No lacerations or abrasions bilateral feet. Nails 1-5 bilateral  are thickened discolored and elongated with subungual debris. Feet appear much improved no fissues or hyperkeratosis noted today. Small abrasion to dorsum of right foot.  Musculoskeletal: MMT 5/5 bilateral lower extremities in DF, PF, Inversion and Eversion. Deceased ROM in DF of ankle joint. Hallux malleolus noted bilateral Neurological: Sensation intact to light touch. Protective sensation diminished bilateral.    Assessment:   1. Pain due to onychomycosis of toenails of both feet   2. Diabetic polyneuropathy associated with type 2 diabetes mellitus ([name])           Plan:  Patient was evaluated and treated and all questions  answered. -Discussed and educated patient on diabetic foot care, especially with  regards to the vascular, neurological and musculoskeletal systems.  -Stressed the importance of good glycemic control and the detriment of not  controlling glucose levels in relation to the foot. -Discussed supportive shoes at all times and checking feet regularly.  -Mechanically debrided all nails 1-5 bilateral using sterile nail nipper and filed with dremel without incident  -Answered all patient questions -Patient to return  in 3 months for at risk foot care -Patient advised to call the office if any problems or questions arise in the meantime. .   Returnin 3 months for rfc. Robert Frey   Return in about 3 months (around 03/14/2024) for rfc.    Robert Frey, DPM

## 2023-12-23 ENCOUNTER — Other Ambulatory Visit: Payer: Self-pay | Admitting: Physician Assistant

## 2023-12-23 DIAGNOSIS — E1129 Type 2 diabetes mellitus with other diabetic kidney complication: Secondary | ICD-10-CM

## 2024-01-03 ENCOUNTER — Other Ambulatory Visit: Payer: Self-pay | Admitting: Physician Assistant

## 2024-01-03 DIAGNOSIS — R809 Proteinuria, unspecified: Secondary | ICD-10-CM

## 2024-01-08 ENCOUNTER — Other Ambulatory Visit: Payer: Self-pay | Admitting: Physician Assistant

## 2024-01-08 DIAGNOSIS — I10 Essential (primary) hypertension: Secondary | ICD-10-CM

## 2024-01-16 ENCOUNTER — Other Ambulatory Visit (HOSPITAL_COMMUNITY): Payer: Self-pay

## 2024-01-16 ENCOUNTER — Telehealth: Payer: Self-pay

## 2024-01-16 NOTE — Telephone Encounter (Signed)
 Pharmacy Patient Advocate Encounter   Received notification from CoverMyMeds that prior authorization for Mounjaro  10MG /0.5ML auto-injectors  is required/requested.   Insurance verification completed.   The patient is insured through [employer] .   Per test claim: PA required; PA submitted to above mentioned insurance via Latent Key/confirmation #/EOC [id number] Status is pending

## 2024-01-18 ENCOUNTER — Other Ambulatory Visit (HOSPITAL_COMMUNITY): Payer: Self-pay

## 2024-01-18 NOTE — Telephone Encounter (Signed)
 Pharmacy Patient Advocate Encounter  Received notification from [employer] that Prior Authorization for Mounjaro  10MG /0.5ML auto-injectors  has been APPROVED from 12/17/23 to 01/17/25. Ran test claim, Copay is $25. This test claim was processed through [facility]- copay amounts may vary at other pharmacies due to pharmacy/plan contracts, or as the patient moves through the different stages of their insurance plan.   PA #/Case ID/Reference #: [id number]

## 2024-01-21 ENCOUNTER — Ambulatory Visit: Payer: Self-pay

## 2024-01-21 NOTE — Telephone Encounter (Signed)
 Patient is scheduled for 01/22/2024 with DR. Alvia.  E2C2 notes read that patient is wanting to go to Urgent care today and then see Dr. Alvia tomorrow.

## 2024-01-21 NOTE — Telephone Encounter (Signed)
 FYI Only or Action Required?: FYI only for provider.  Patient was last seen in primary care on 09/10/2023 by Antoniette Vermell CROME, PA-C.  Called Nurse Triage reporting Triage.  Symptoms began several days ago.  Interventions attempted: Nothing.  Symptoms are: unchanged.  Triage Disposition: See Today in Office, Call PCP When Office is Open   Patient/caregiver understands and will follow disposition?:  Reason for Disposition  [1] Morning (before breakfast) blood glucose < 80 mg/dL (4.4 mmol/L) AND [7] more than once in past week  Answer Assessment - Initial Assessment Questions Patient scheduled for an appt tomorrow (soonest available) but would also like to go to UC today.   1. SYMPTOMS: What symptoms are you concerned about?     Patient reports he feels shaky  2. ONSET:  When did the symptoms start?     Yesterday morning  3. BLOOD GLUCOSE: What is your blood glucose level?      All before breakfast: 8/23 77, 8/24 72, today 181 via Libre  4. USUAL RANGE: What is your blood glucose level usually? (e.g., usual fasting morning value, usual evening value)     111  5. TYPE 1 or 2:  Do you know what type of diabetes you have?  (e.g., Type 1, Type 2, Gestational; doesn't know)      2  6. INSULIN : Do you take insulin ? What type of insulin (s) do you use? What is the mode of delivery? (syringe, pen; injection or pump) When did you last give yourself an insulin  dose? (i.e., time or hours/minutes ago) How much did you give? (i.e., how many units)     [place]  Protocols used: Diabetes - Low Blood Sugar-A-AH Copied from CRM #[id number]. Topic: Clinical - Red Word Triage >> Jan 21, 2024  9:41 AM Merlynn LABOR wrote: Red Word that prompted transfer to Nurse Triage: Blood sugar 72 yesterday, today 183 and will not go down, not feeling well

## 2024-01-22 ENCOUNTER — Encounter: Payer: Self-pay | Admitting: Family Medicine

## 2024-01-22 ENCOUNTER — Ambulatory Visit (INDEPENDENT_AMBULATORY_CARE_PROVIDER_SITE_OTHER): Admitting: Family Medicine

## 2024-01-22 VITALS — BP 151/78 | HR 113 | Ht 71.0 in | Wt 210.0 lb

## 2024-01-22 DIAGNOSIS — R809 Proteinuria, unspecified: Secondary | ICD-10-CM

## 2024-01-22 DIAGNOSIS — Z794 Long term (current) use of insulin: Secondary | ICD-10-CM | POA: Diagnosis not present

## 2024-01-22 DIAGNOSIS — E1129 Type 2 diabetes mellitus with other diabetic kidney complication: Secondary | ICD-10-CM

## 2024-01-22 DIAGNOSIS — R112 Nausea with vomiting, unspecified: Secondary | ICD-10-CM | POA: Insufficient documentation

## 2024-01-22 LAB — GLUCOSE, POCT (MANUAL RESULT ENTRY): POC Glucose: 142 mg/dL — AB (ref 70–99)

## 2024-01-22 MED ORDER — ONDANSETRON 8 MG PO TBDP
8.0000 mg | ORAL_TABLET | Freq: Three times a day (TID) | ORAL | 3 refills | Status: AC | PRN
Start: 2024-01-22 — End: ?

## 2024-01-22 NOTE — Progress Notes (Signed)
 Robert Frey - 63 y.o. male MRN [id number]  Date of birth: [birth year]  Subjective Chief Complaint  Patient presents with   Blood Sugar Problem    HPI Robert Frey is a 63 y.o. male here today with complaint of nausea and vomiting as well as irregular blood sugars.  He reports that over the past few days he has had nausea with vomiting.  Anything he tries to eat down.  Blood sugars have been irregular ranging from the upper 70s to as high as 300.  He is using a CGM to monitor his blood glucose.  He has not really eaten anything over the past several days.  He is trying to stay hydrated with increased fluids.  He does have some occasional abdominal pain.  He has not had any coffee-ground emesis, dark stools, fever, chills, chest pain or dyspnea.  He is on tirzepatide  but denies any recent changes in strength.  ROS:  A comprehensive ROS was completed and negative except as noted per HPI  Allergies  Allergen Reactions   Green Dyes Hives   Keflex  [Cephalexin ]     Whole body rash   Propranolol      Nausea/vomiting   Latex Rash    Powder in gloves   Zithromax [Azithromycin Dihydrate] Rash    Past Medical History:  Diagnosis Date   Ankylosing spondylitis ([name])    COPD (chronic obstructive pulmonary disease) ([name])    Diabetes mellitus without complication ([name])    High cholesterol    Hypertension    PTSD (post-traumatic stress disorder)     Past Surgical History:  Procedure Laterality Date   APPENDECTOMY     CARPAL TUNNEL RELEASE Right    ent surgery     ORIF CALCANEOUS FRACTURE Right 08/01/2021   Procedure: OPEN REDUCTION INTERNAL FIXATION (ORIF) FOOT FRACTURE;  Surgeon: Celena Sharper, MD;  Location: [facility] OR;  Service: Orthopedics;  Laterality: Right;   TONSILLECTOMY AND ADENOIDECTOMY      Social History   Socioeconomic History   Marital status: Married    Spouse name: Not on file   Number of children: Not on file   Years of education: Not on file   Highest education  level: Some college, no degree  Occupational History   Not on file  Tobacco Use   Smoking status: Every Day    Current packs/day: 1.00    Average packs/day: 1 pack/day for 25.0 years (25.0 ttl pk-yrs)    Types: Cigarettes   Smokeless tobacco: Never  Vaping Use   Vaping status: Never Used  Substance and Sexual Activity   Alcohol use: Not Currently    Alcohol/week: 2.0 standard drinks of alcohol    Types: 2 Cans of beer per week    Comment: not since early April 2024   Drug use: No   Sexual activity: Not on file  Other Topics Concern   Not on file  Social History Narrative   Not on file   Social Drivers of Health   Financial Resource Strain: Low Risk  (04/16/2023)   Overall Financial Resource Strain (CARDIA)    Difficulty of Paying Living Expenses: Not hard at all  Food Insecurity: Low Risk  (08/15/2023)   Received from [facility]   Hunger Vital Sign    Within the past 12 months, you worried that your food would run out before you got money to buy more: Never true    Within the past 12 months, the food you bought just didn't last and  you didn't have money to get more. : Never true  Transportation Needs: No Transportation Needs (08/15/2023)   Received [facility]   Transportation    In the past 12 months, has lack of reliable transportation kept you from medical appointments, meetings, work or from getting things needed for daily living? : No  Physical Activity: Unknown (04/16/2023)   Exercise Vital Sign    Days of Exercise per Week: 0 days    Minutes of Exercise per Session: Not on file  Stress: No Stress Concern Present (04/16/2023)   [employer] of Occupational Health - Occupational Stress Questionnaire    Feeling of Stress : Not at all  Social Connections: Moderately Isolated (04/16/2023)   Social Connection and Isolation Panel    Frequency of Communication with Friends and Family: Once a week    Frequency of Social Gatherings with Friends and Family:  Once a week    Attends Religious Services: 1 to 4 times per year    Active Member of [employer] or Organizations: No    Attends Engineer, structural: Not on file    Marital Status: Married    Family History  Problem Relation Age of Onset   Stroke Other    Alcohol abuse Other    Depression Other    Hypertension Other    Hyperlipidemia Other    Diabetes Mother    Alzheimer's disease Mother    Diabetes Sister    Stroke Sister     Health Maintenance  Topic Date Due   HIV Screening  Never done   Lung Cancer Screening  Never done   Pneumococcal Vaccine: 50+ Years (3 of 3 - PCV) 10/09/2018   COVID-19 Vaccine (5 - 2024-25 season) 01/28/2023   FOOT EXAM  06/13/2023   OPHTHALMOLOGY EXAM  06/13/2023   Diabetic kidney evaluation - Urine ACR  09/11/2023   Colonoscopy  09/29/2023   HEMOGLOBIN A1C  10/15/2023   INFLUENZA VACCINE  12/28/2023   Diabetic kidney evaluation - eGFR measurement  08/21/2024   DTaP/Tdap/Td (3 - Td or Tdap) 07/20/2033   Hepatitis C Screening  Completed   Hepatitis B Vaccines 19-59 Average Risk  Aged Out   HPV VACCINES  Aged Out   Meningococcal B Vaccine  Aged Out   Zoster Vaccines- Shingrix  Discontinued     ----------------------------------------------------------------------------------------------------------------------------------------------------------------------------------------------------------------- Physical Exam BP (!) 151/78 (BP Location: Left Arm, Patient Position: Sitting, Cuff Size: Normal)   Pulse (!) 113   Ht 5' 11 (1.803 m)   Wt 210 lb (95.3 kg)   SpO2 99%   BMI 29.29 kg/m   Physical Exam Constitutional:      Appearance: Normal appearance.  HENT:     Head: Normocephalic and atraumatic.  Cardiovascular:     Rate and Rhythm: Normal rate and regular rhythm.  Pulmonary:     Effort: Pulmonary effort is normal.     Breath sounds: Normal breath sounds.  Abdominal:     General: There is no distension.     Palpations: Abdomen  is soft. There is no mass.     Tenderness: There is no abdominal tenderness. There is no guarding.     Hernia: No hernia is present.  Neurological:     General: No focal deficit present.     Mental Status: He is alert.  Psychiatric:        Mood and Affect: Mood normal.        Behavior: Behavior normal.     ------------------------------------------------------------------------------------------------------------------------------------------------------------------------------------------------------------------- Assessment and Plan  Nausea and vomiting Broad differential includes gastroenteritis, gastritis, peptic ulcer disease, pancreatitis gallbladder disease and gastroparesis.  Checking CMP, CBC and lipase today.  Zofran  as needed for nausea.  Recommend increase fluids.  Clear liquid diet and advancing as tolerated.   Meds ordered this encounter  Medications   ondansetron  (ZOFRAN -ODT) 8 MG disintegrating tablet    Sig: Take 1 tablet (8 mg total) by mouth every 8 (eight) hours as needed for nausea.    Dispense:  20 tablet    Refill:  3    No follow-ups on file.

## 2024-01-22 NOTE — Patient Instructions (Signed)
 Hold mounjaro  for now.   Zofran  as needed for nausea.  Follow clear liquid diet and advance as tolerated.

## 2024-01-22 NOTE — Progress Notes (Signed)
 Patient states blood sugar levels have been fluctuating wildly for several days. Unable to keep food down. Emesis x 24hrs. Constantly nauseated. Currently has a headache.  Blood Sugar log:  Saturday: 77 Sunday: 72 Monday: 300 (without having eaten) Tuesday: 135

## 2024-01-22 NOTE — Assessment & Plan Note (Signed)
 Broad differential includes gastroenteritis, gastritis, peptic ulcer disease, pancreatitis gallbladder disease and gastroparesis.  Checking CMP, CBC and lipase today.  Zofran  as needed for nausea.  Recommend increase fluids.  Clear liquid diet and advancing as tolerated.

## 2024-01-23 LAB — CMP14+EGFR
ALT: 38 IU/L (ref 0–44)
AST: 37 IU/L (ref 0–40)
Albumin: 4.9 g/dL (ref 3.8–4.9)
Alkaline Phosphatase: 98 IU/L (ref 44–121)
BUN/Creatinine Ratio: 13 (ref 10–24)
BUN: 12 mg/dL (ref 8–27)
Bilirubin Total: 1 mg/dL (ref 0.0–1.2)
CO2: 23 mmol/L (ref 20–29)
Calcium: 10.1 mg/dL (ref 8.6–10.2)
Chloride: 90 mmol/L — ABNORMAL LOW (ref 96–106)
Creatinine, Ser: 0.89 mg/dL (ref 0.76–1.27)
Globulin, Total: 3 g/dL (ref 1.5–4.5)
Glucose: 120 mg/dL — ABNORMAL HIGH (ref 70–99)
Potassium: 3.6 mmol/L (ref 3.5–5.2)
Sodium: 134 mmol/L (ref 134–144)
Total Protein: 7.9 g/dL (ref 6.0–8.5)
eGFR: 98 mL/min/1.73 (ref >59)

## 2024-01-23 LAB — LIPASE: Lipase: 33 U/L (ref 13–78)

## 2024-01-23 LAB — CBC WITH DIFFERENTIAL/PLATELET
Basophils Absolute: 0 x10E3/uL (ref 0.0–0.2)
Basos: 0 %
EOS (ABSOLUTE): 0 x10E3/uL (ref 0.0–0.4)
Eos: 0 %
Hematocrit: 44.5 % (ref 37.5–51.0)
Hemoglobin: 15.1 g/dL (ref 13.0–17.7)
Immature Grans (Abs): 0 x10E3/uL (ref 0.0–0.1)
Immature Granulocytes: 0 %
Lymphocytes Absolute: 1.7 x10E3/uL (ref 0.7–3.1)
Lymphs: 16 %
MCH: 31.5 pg (ref 26.6–33.0)
MCHC: 33.9 g/dL (ref 31.5–35.7)
MCV: 93 fL (ref 79–97)
Monocytes Absolute: 1.5 x10E3/uL — ABNORMAL HIGH (ref 0.1–0.9)
Monocytes: 14 %
Neutrophils Absolute: 7.7 x10E3/uL — ABNORMAL HIGH (ref 1.4–7.0)
Neutrophils: 70 %
Platelets: 243 x10E3/uL (ref 150–450)
RBC: 4.79 x10E6/uL (ref 4.14–5.80)
RDW: 17.6 % — ABNORMAL HIGH (ref 11.6–15.4)
WBC: 11 x10E3/uL — ABNORMAL HIGH (ref 3.4–10.8)

## 2024-01-29 ENCOUNTER — Encounter: Payer: Self-pay | Admitting: Sports Medicine

## 2024-02-03 ENCOUNTER — Ambulatory Visit: Payer: Self-pay | Admitting: Family Medicine

## 2024-03-13 ENCOUNTER — Other Ambulatory Visit (HOSPITAL_COMMUNITY): Payer: Self-pay

## 2024-03-13 ENCOUNTER — Telehealth: Payer: Self-pay

## 2024-03-13 NOTE — Telephone Encounter (Signed)
 Pharmacy Patient Advocate Encounter   Received notification from [employer] that prior authorization for Icosapent  Ethyl 1gm caps is required/requested.   Insurance verification completed.   The patient is insured through [employer].   Per test claim: PA required; PA submitted to above mentioned insurance via Latent Key/confirmation #/EOC [facility] Status is pending

## 2024-03-14 ENCOUNTER — Ambulatory Visit: Admitting: Podiatry

## 2024-03-27 ENCOUNTER — Other Ambulatory Visit (HOSPITAL_COMMUNITY): Payer: Self-pay

## 2024-03-27 NOTE — Telephone Encounter (Signed)
 Pharmacy Patient Advocate Encounter  Received notification from [employer] that Prior Authorization for Icosapent  Ethyl 1gm caps has been APPROVED from 02/12/24 to 03/26/25. Ran test claim, Copay is $10. This test claim was processed through [facility]- copay amounts may vary at other pharmacies due to pharmacy/plan contracts, or as the patient moves through the different stages of their insurance plan.   PA #/Case ID/Reference #: [id number]  Left a message at [facility] to notify of the approval

## 2024-04-16 ENCOUNTER — Ambulatory Visit (INDEPENDENT_AMBULATORY_CARE_PROVIDER_SITE_OTHER): Admitting: Physician Assistant

## 2024-04-16 ENCOUNTER — Encounter: Payer: Self-pay | Admitting: Physician Assistant

## 2024-04-16 VITALS — BP 140/70 | HR 107 | Ht 71.0 in | Wt 222.0 lb

## 2024-04-16 DIAGNOSIS — F4323 Adjustment disorder with mixed anxiety and depressed mood: Secondary | ICD-10-CM

## 2024-04-16 DIAGNOSIS — E1129 Type 2 diabetes mellitus with other diabetic kidney complication: Secondary | ICD-10-CM

## 2024-04-16 DIAGNOSIS — Z7985 Long-term (current) use of injectable non-insulin antidiabetic drugs: Secondary | ICD-10-CM

## 2024-04-16 DIAGNOSIS — E785 Hyperlipidemia, unspecified: Secondary | ICD-10-CM | POA: Diagnosis not present

## 2024-04-16 DIAGNOSIS — F1090 Alcohol use, unspecified, uncomplicated: Secondary | ICD-10-CM

## 2024-04-16 DIAGNOSIS — Z7984 Long term (current) use of oral hypoglycemic drugs: Secondary | ICD-10-CM

## 2024-04-16 DIAGNOSIS — I10 Essential (primary) hypertension: Secondary | ICD-10-CM

## 2024-04-16 DIAGNOSIS — E1142 Type 2 diabetes mellitus with diabetic polyneuropathy: Secondary | ICD-10-CM | POA: Diagnosis not present

## 2024-04-16 DIAGNOSIS — F17200 Nicotine dependence, unspecified, uncomplicated: Secondary | ICD-10-CM

## 2024-04-16 DIAGNOSIS — Z23 Encounter for immunization: Secondary | ICD-10-CM

## 2024-04-16 DIAGNOSIS — L409 Psoriasis, unspecified: Secondary | ICD-10-CM

## 2024-04-16 LAB — POCT GLYCOSYLATED HEMOGLOBIN (HGB A1C): Hemoglobin A1C: 7.2 % — AB (ref 4.0–5.6)

## 2024-04-16 MED ORDER — SERTRALINE HCL 25 MG PO TABS: 25.0000 mg | ORAL_TABLET | Freq: Every day | ORAL | 2 refills | Status: AC

## 2024-04-16 NOTE — Progress Notes (Signed)
 Established Patient Office Visit  Subjective   Patient ID: Robert Frey, male    DOB: Oct 22, 1960  Age: 63 y.o. MRN: [id number]  Chief Complaint  Patient presents with   Medical Management of Chronic Issues    HPI .Discussed the use of AI scribe software for clinical note transcription with the patient, who gave verbal consent to proceed.  History of Present Illness Robert Frey is a 63 year old male who presents for follow-up.  Glycemic control and insulin  adherence - Diabetes mellitus with home blood glucose readings ranging from 130-140 mg/dL, with occasional drops below 100 mg/dL - Missed some insulin  doses - Previously using a Libre sensor for glucose monitoring, but ran out last week and requires a new sensor  Neurotrauma sequelae - Sustained a fall in March resulting in a heavy skull fracture, three brain bleeds, cervical spine fracture, and a shoulder fracture in two places  Psychological distress and social isolation - Experiencing significant personal stress due to separation from wife since March or early April - Feels isolated with no family or friends nearby - Planning to move to be closer to remaining family - Arts Administrator at the end of December and considering early Social Security - Endorses feelings of grief and possible depression  Nicotine and alcohol use - Resumed smoking after a period of cessation - Occasionally drinks beer, but not frequently  Dermatological symptoms and medication adherence - Currently using Dupixent for psorasis - Skipped some doses of Dupixent, resulting in mild recurrence of symptoms  Cardiopulmonary symptoms - No chest pain or shortness of breath    ROS See HPI.    Objective:     BP (!) 140/70   Pulse (!) 107   Ht 5' 11 (1.803 m)   Wt 222 lb (100.7 kg)   SpO2 99%   BMI 30.96 kg/m  BP Readings from Last 3 Encounters:  04/16/24 (!) 140/70  01/22/24 (!) 151/78  09/10/23 (!) 140/78   Wt Readings from Last 3  Encounters:  04/16/24 222 lb (100.7 kg)  01/22/24 210 lb (95.3 kg)  08/22/23 203 lb (92.1 kg)    .Robert Frey Results for orders placed or performed in visit on 04/16/24  POCT HgB A1C   Collection Time: 04/16/24  9:00 AM  Result Value Ref Range   Hemoglobin A1C 7.2 (A) 4.0 - 5.6 %   HbA1c POC (<> result, manual entry)     HbA1c, POC (prediabetic range)     HbA1c, POC (controlled diabetic range)        Physical Exam Constitutional:      Appearance: Normal appearance. He is obese.  HENT:     Head: Normocephalic.  Cardiovascular:     Rate and Rhythm: Normal rate and regular rhythm.  Pulmonary:     Effort: Pulmonary effort is normal.     Breath sounds: Normal breath sounds.  Neurological:     General: No focal deficit present.     Mental Status: He is alert and oriented to person, place, and time.  Psychiatric:     Comments: Tearful with flat affect       Assessment & Plan:  Robert Frey was seen today for medical management of chronic issues.  Diagnoses and all orders for this visit:  Diabetic polyneuropathy associated with type 2 diabetes mellitus ([name]) -     POCT HgB A1C -     pregabalin  (LYRICA ) 75 MG capsule; Take 1 capsule (75 mg total) by mouth 2 (two) times daily. -  Dapagliflozin  Pro-metFORMIN  ER (XIGDUO  XR) 02-999 MG TB24; Take 1 tablet by mouth daily. -     tirzepatide  (MOUNJARO ) 10 MG/0.5ML Pen; Inject 10 mg into the skin once a week. INJECT 10 MG INTO THE SKIN ONE TIME PER WEEK -     insulin  degludec (TRESIBA  FLEXTOUCH) 100 UNIT/ML FlexTouch Pen; Inject 14 Units into the skin 2 (two) times daily. -     Continuous Glucose Sensor (FREESTYLE LIBRE 3 SENSOR) MISC; 1 each by Does not apply route every 14 (fourteen) days. Please apply for 14 days and then switch to new sensor  Immunization due -     Pneumococcal conjugate vaccine 20-valent (Prevnar 20)  Essential hypertension  Psoriasis  Dyslipidemia, goal LDL below 70  Adjustment disorder with mixed anxiety and  depressed mood -     sertraline  (ZOLOFT ) 25 MG tablet; Take 1 tablet (25 mg total) by mouth daily. For mood.  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 Inconsistent CGM use due to lack of sensors. A1C today is 7.2, not to goal.  - Prescribed new CGM sensors. - Not taking medication as directed, reports he plans to get back on track - Continue Mounjaro /Xigduo /Tresbia - Pneumonia vaccine given today, declines flu vaccine - Encouraged to get eye exam scheduled - Not able to urinate today for microalbumin - Refilled Lyrica   Hypertension - BP not to goal, on ARB, continue for now - Patient is in distress today, will work on mood and stress - Follow up in 3 months  Depression/grief Experiencing significant grief and depression due to personal and family issues. No current medication for depression. - Discussed potential need for medication for depression. - Start zoloft  - Declined counseling  Psoriasis (on Dupixent) Reported a breakout after skipping Dupixent doses, less severe than previous episodes.  Tobacco use Continues to smoke despite health conditions. - Discussed the importance of smoking cessation.  Alcohol use Occasional beer consumption, not frequent. - Discussed the importance of moderation in alcohol consumption.    Return in about 3 months (around 07/17/2024).    Lache Dagher, PA-C

## 2024-04-18 DIAGNOSIS — F17200 Nicotine dependence, unspecified, uncomplicated: Secondary | ICD-10-CM | POA: Insufficient documentation

## 2024-04-18 DIAGNOSIS — F1090 Alcohol use, unspecified, uncomplicated: Secondary | ICD-10-CM | POA: Insufficient documentation

## 2024-04-18 DIAGNOSIS — F4323 Adjustment disorder with mixed anxiety and depressed mood: Secondary | ICD-10-CM | POA: Insufficient documentation

## 2024-04-18 MED ORDER — PREGABALIN 75 MG PO CAPS: 75.0000 mg | ORAL_CAPSULE | Freq: Two times a day (BID) | ORAL | 1 refills | Status: AC

## 2024-04-18 MED ORDER — TRESIBA FLEXTOUCH 100 UNIT/ML ~~LOC~~ SOPN: 14.0000 [IU] | PEN_INJECTOR | Freq: Two times a day (BID) | SUBCUTANEOUS | 2 refills | Status: AC

## 2024-04-18 MED ORDER — DAPAGLIFLOZIN PRO-METFORMIN ER 10-1000 MG PO TB24: 1.0000 | ORAL_TABLET | Freq: Every day | ORAL | 1 refills | Status: AC

## 2024-04-18 MED ORDER — MOUNJARO 10 MG/0.5ML ~~LOC~~ SOAJ: 10.0000 mg | SUBCUTANEOUS | 3 refills | Status: AC

## 2024-04-18 MED ORDER — FREESTYLE LIBRE 3 SENSOR MISC: 1.0000 | 3 refills | Status: AC

## 2024-04-19 ENCOUNTER — Other Ambulatory Visit: Payer: Self-pay | Admitting: Physician Assistant

## 2024-04-19 DIAGNOSIS — G250 Essential tremor: Secondary | ICD-10-CM

## 2024-05-12 ENCOUNTER — Other Ambulatory Visit: Payer: Self-pay | Admitting: Physician Assistant

## 2024-05-12 DIAGNOSIS — F4323 Adjustment disorder with mixed anxiety and depressed mood: Secondary | ICD-10-CM

## 2024-06-07 ENCOUNTER — Other Ambulatory Visit: Payer: Self-pay | Admitting: Physician Assistant

## 2024-06-07 DIAGNOSIS — M450 Ankylosing spondylitis of multiple sites in spine: Secondary | ICD-10-CM

## 2024-07-18 ENCOUNTER — Ambulatory Visit: Admitting: Physician Assistant
